# Patient Record
Sex: Female | Born: 1940 | Race: White | Hispanic: No | Marital: Married | Smoking: Never smoker
Health system: Southern US, Community
[De-identification: ages and names within clinical notes are randomized; demographics above are authoritative.]

## PROBLEM LIST (undated history)

## (undated) DIAGNOSIS — G8929 Other chronic pain: Secondary | ICD-10-CM

## (undated) DIAGNOSIS — K648 Other hemorrhoids: Secondary | ICD-10-CM

## (undated) DIAGNOSIS — J479 Bronchiectasis, uncomplicated: Secondary | ICD-10-CM

## (undated) DIAGNOSIS — J948 Other specified pleural conditions: Secondary | ICD-10-CM

## (undated) DIAGNOSIS — I10 Essential (primary) hypertension: Secondary | ICD-10-CM

## (undated) DIAGNOSIS — M79676 Pain in unspecified toe(s): Secondary | ICD-10-CM

## (undated) DIAGNOSIS — K5790 Diverticulosis of intestine, part unspecified, without perforation or abscess without bleeding: Secondary | ICD-10-CM

## (undated) DIAGNOSIS — C801 Malignant (primary) neoplasm, unspecified: Secondary | ICD-10-CM

## (undated) DIAGNOSIS — A439 Nocardiosis, unspecified: Principal | ICD-10-CM

## (undated) DIAGNOSIS — E871 Hypo-osmolality and hyponatremia: Secondary | ICD-10-CM

## (undated) DIAGNOSIS — M1990 Unspecified osteoarthritis, unspecified site: Secondary | ICD-10-CM

## (undated) DIAGNOSIS — T41205A Adverse effect of unspecified general anesthetics, initial encounter: Secondary | ICD-10-CM

## (undated) DIAGNOSIS — K581 Irritable bowel syndrome with constipation: Secondary | ICD-10-CM

## (undated) DIAGNOSIS — A310 Pulmonary mycobacterial infection: Secondary | ICD-10-CM

## (undated) DIAGNOSIS — T797XXA Traumatic subcutaneous emphysema, initial encounter: Secondary | ICD-10-CM

## (undated) DIAGNOSIS — T8859XA Other complications of anesthesia, initial encounter: Secondary | ICD-10-CM

## (undated) DIAGNOSIS — F419 Anxiety disorder, unspecified: Secondary | ICD-10-CM

## (undated) DIAGNOSIS — I514 Myocarditis, unspecified: Secondary | ICD-10-CM

## (undated) DIAGNOSIS — H469 Unspecified optic neuritis: Secondary | ICD-10-CM

## (undated) DIAGNOSIS — T50905A Adverse effect of unspecified drugs, medicaments and biological substances, initial encounter: Secondary | ICD-10-CM

## (undated) DIAGNOSIS — I639 Cerebral infarction, unspecified: Secondary | ICD-10-CM

## (undated) DIAGNOSIS — T4145XA Adverse effect of unspecified anesthetic, initial encounter: Secondary | ICD-10-CM

## (undated) DIAGNOSIS — R1310 Dysphagia, unspecified: Secondary | ICD-10-CM

## (undated) DIAGNOSIS — H5712 Ocular pain, left eye: Principal | ICD-10-CM

## (undated) DIAGNOSIS — M549 Dorsalgia, unspecified: Secondary | ICD-10-CM

## (undated) HISTORY — DX: Other chronic pain: G89.29

## (undated) HISTORY — DX: Unspecified optic neuritis: H46.9

## (undated) HISTORY — PX: VAGINAL HYSTERECTOMY: SUR661

## (undated) HISTORY — DX: Myocarditis, unspecified: I51.4

## (undated) HISTORY — DX: Pain in unspecified toe(s): M79.676

## (undated) HISTORY — DX: Other hemorrhoids: K64.8

## (undated) HISTORY — DX: Bronchiectasis, uncomplicated: J47.9

## (undated) HISTORY — DX: Dorsalgia, unspecified: M54.9

## (undated) HISTORY — DX: Anxiety disorder, unspecified: F41.9

## (undated) HISTORY — PX: BREAST BIOPSY: SHX20

## (undated) HISTORY — DX: Irritable bowel syndrome with constipation: K58.1

## (undated) HISTORY — DX: Hypo-osmolality and hyponatremia: E87.1

## (undated) HISTORY — PX: LAMINECTOMY: SHX219

## (undated) HISTORY — DX: Ocular pain, left eye: H57.12

## (undated) HISTORY — DX: Nocardiosis, unspecified: A43.9

## (undated) HISTORY — PX: CHOLECYSTECTOMY: SHX55

## (undated) HISTORY — DX: Adverse effect of unspecified drugs, medicaments and biological substances, initial encounter: T50.905A

## (undated) HISTORY — DX: Pulmonary mycobacterial infection: A31.0

## (undated) HISTORY — PX: CATARACT EXTRACTION W/ INTRAOCULAR LENS  IMPLANT, BILATERAL: SHX1307

## (undated) HISTORY — DX: Diverticulosis of intestine, part unspecified, without perforation or abscess without bleeding: K57.90

## (undated) HISTORY — DX: Dysphagia, unspecified: R13.10

## (undated) HISTORY — DX: Other specified pleural conditions: J94.8

## (undated) HISTORY — DX: Traumatic subcutaneous emphysema, initial encounter: T79.7XXA

---

## 2008-01-29 LAB — HM COLONOSCOPY

## 2010-10-09 ENCOUNTER — Other Ambulatory Visit: Payer: Self-pay | Admitting: Neurosurgery

## 2010-10-09 DIAGNOSIS — M545 Low back pain: Secondary | ICD-10-CM

## 2010-10-14 ENCOUNTER — Ambulatory Visit
Admission: RE | Admit: 2010-10-14 | Discharge: 2010-10-14 | Disposition: A | Discharge status: Home / Self Care | Payer: Medicare Other | Source: Ambulatory Visit | Attending: Neurosurgery | Admitting: Neurosurgery

## 2010-10-14 DIAGNOSIS — M545 Low back pain, unspecified: Secondary | ICD-10-CM

## 2010-11-20 ENCOUNTER — Other Ambulatory Visit: Payer: Self-pay | Admitting: Internal Medicine

## 2010-11-20 DIAGNOSIS — R922 Inconclusive mammogram: Secondary | ICD-10-CM

## 2010-12-05 ENCOUNTER — Other Ambulatory Visit: Payer: Self-pay | Admitting: Internal Medicine

## 2010-12-05 ENCOUNTER — Ambulatory Visit
Admission: RE | Admit: 2010-12-05 | Discharge: 2010-12-05 | Disposition: A | Discharge status: Home / Self Care | Payer: Medicare Other | Source: Ambulatory Visit | Attending: Internal Medicine | Admitting: Internal Medicine

## 2010-12-05 DIAGNOSIS — R922 Inconclusive mammogram: Secondary | ICD-10-CM

## 2011-02-15 DIAGNOSIS — H52209 Unspecified astigmatism, unspecified eye: Secondary | ICD-10-CM | POA: Diagnosis not present

## 2011-02-15 DIAGNOSIS — H259 Unspecified age-related cataract: Secondary | ICD-10-CM | POA: Diagnosis not present

## 2011-02-26 DIAGNOSIS — R05 Cough: Secondary | ICD-10-CM | POA: Diagnosis not present

## 2011-03-07 ENCOUNTER — Ambulatory Visit
Admission: RE | Admit: 2011-03-07 | Discharge: 2011-03-07 | Disposition: A | Discharge status: Home / Self Care | Payer: Medicare Other | Source: Ambulatory Visit | Attending: Internal Medicine | Admitting: Internal Medicine

## 2011-03-07 ENCOUNTER — Other Ambulatory Visit: Payer: Self-pay | Admitting: Internal Medicine

## 2011-03-07 DIAGNOSIS — R918 Other nonspecific abnormal finding of lung field: Secondary | ICD-10-CM

## 2011-03-07 DIAGNOSIS — J3489 Other specified disorders of nose and nasal sinuses: Secondary | ICD-10-CM | POA: Diagnosis not present

## 2011-03-07 DIAGNOSIS — J449 Chronic obstructive pulmonary disease, unspecified: Secondary | ICD-10-CM | POA: Diagnosis not present

## 2011-03-07 DIAGNOSIS — R05 Cough: Secondary | ICD-10-CM | POA: Diagnosis not present

## 2011-03-22 DIAGNOSIS — J323 Chronic sphenoidal sinusitis: Secondary | ICD-10-CM | POA: Diagnosis not present

## 2011-03-22 DIAGNOSIS — J342 Deviated nasal septum: Secondary | ICD-10-CM | POA: Diagnosis not present

## 2011-03-22 DIAGNOSIS — H9110 Presbycusis, unspecified ear: Secondary | ICD-10-CM | POA: Diagnosis not present

## 2011-03-22 DIAGNOSIS — R05 Cough: Secondary | ICD-10-CM | POA: Diagnosis not present

## 2011-03-22 DIAGNOSIS — J310 Chronic rhinitis: Secondary | ICD-10-CM | POA: Diagnosis not present

## 2011-04-12 ENCOUNTER — Telehealth: Payer: Self-pay | Admitting: Internal Medicine

## 2011-04-16 NOTE — Telephone Encounter (Signed)
Made in error

## 2011-04-18 ENCOUNTER — Ambulatory Visit (INDEPENDENT_AMBULATORY_CARE_PROVIDER_SITE_OTHER): Payer: Medicare Other | Admitting: Internal Medicine

## 2011-04-18 ENCOUNTER — Encounter: Payer: Self-pay | Admitting: Internal Medicine

## 2011-04-18 DIAGNOSIS — J479 Bronchiectasis, uncomplicated: Secondary | ICD-10-CM | POA: Diagnosis not present

## 2011-04-18 DIAGNOSIS — R05 Cough: Secondary | ICD-10-CM

## 2011-04-18 NOTE — Progress Notes (Signed)
04/18/11- 71 yo F never smoker referred by Dr Jacky Kindle because of cough. Persistent cough since January. In late February she was treated with 7 days of Levaquin after chest x-ray showed some density. Cough is better in warm moist air, worse when out in pollen or when active and better at night. Cough improved with Levaquin but persists. Notices postnasal drip. Saw ENT/Dr.Wolicki who didn't think there was enough of sinus problem to be of concern. Cough is dry. She denies enlarged glands, fever, sweat or weight loss. Denies history of lung or nasal condition. She has been told that she has some wheeze in the right lung, going back years. She insists she has always been healthy and athletic, never aware of shortness of breath or breathing problems. Denies GERD. Remote history of walking pneumonia. As a small child mother had tuberculosis. No PPD in many years but not remembered  as positive. Chest x-ray 02/26/2011-right-sided infiltrate recommend followup. Chest x-ray 03/07/2011-COPD, prominent linear markings in the right middle lobe may be due to atelectasis or scarring but pneumonia can't be excluded.  Prior to Admission medications   Medication Sig Start Date End Date Taking? Authorizing Provider  calcium carbonate (OS-CAL) 1250 MG chewable tablet Chew 1 tablet by mouth daily.   Yes Historical Provider, MD  Multiple Vitamin (MULTIVITAMIN) tablet Take 1 tablet by mouth daily.   Yes Historical Provider, MD   Past Medical History  Diagnosis Date  . Osteoporosis   . ALLERGIC RHINITIS   . Chronic back pain    Family History  Problem Relation Age of Onset  . Breast cancer Mother   . Stroke Father   . Pulmonary embolism Brother    History   Social History  . Marital Status: Married    Spouse Name: N/A    Number of Children: N/A  . Years of Education: N/A   Occupational History  . retired    Social History Main Topics  . Smoking status: Never Smoker   . Smokeless tobacco: Not on file    . Alcohol Use: 0.6 oz/week    1 Glasses of wine per week     at dinner  . Drug Use: No  . Sexually Active: Not on file   Other Topics Concern  . Not on file   Social History Narrative  . No narrative on file   ROS-see HPI Constitutional:   No-   weight loss, night sweats, fevers, chills, fatigue, lassitude. HEENT:   No-  headaches, difficulty swallowing, tooth/dental problems, sore throat,       No-  sneezing, itching, ear ache, nasal congestion, post nasal drip,  CV:  No-   chest pain, orthopnea, PND, swelling in lower extremities, anasarca, dizziness, palpitations Resp: No-   shortness of breath with exertion or at rest.              No-   productive cough,  + non-productive cough,  No- coughing up of blood.              No-   change in color of mucus.  + wheezing.   Skin: No-   rash or lesions. GI:  No-   heartburn, indigestion, abdominal pain, nausea, vomiting,  GU:  MS:  No-   joint pain or swelling.   Neuro-     nothing unusual Psych:  No- change in mood or affect. No depression or anxiety.  No memory loss.  OBJ- Physical Exam General- Alert, Oriented, Affect-appropriate, Distress- none acute, slender, talkative  Skin- rash-none, lesions- none, excoriation- none Lymphadenopathy- none Head- atraumatic            Eyes- Gross vision intact, PERRLA, conjunctivae and secretions clear            Ears- Hearing, canals-normal            Nose- Clear, no-Septal dev, mucus, polyps, erosion, perforation             Throat- Mallampati II , mucosa clear , drainage- none, tonsils- atrophic Neck- flexible , trachea midline, no stridor , thyroid nl, carotid no bruit Chest - symmetrical excursion , unlabored           Heart/CV- RRR , no murmur , no gallop  , no rub, nl s1 s2                           - JVD- none , edema- none, stasis changes- none, varices- none           Lung- wet rhonchi right posterior base, cough- none , dullness-none, rub- none           Chest wall-  Abd-  tender-no, distended-no, bowel sounds-present, HSM- no Br/ Gen/ Rectal- Not done, not indicated Extrem- cyanosis- none, clubbing, none, atrophy- none, strength- nl Neuro- grossly intact to observation

## 2011-04-18 NOTE — Patient Instructions (Signed)
Order- CT chest non contrast,  Dx bronchiectasis  Order- PFT    Dx bronchiectasis  Place PPD   Suggest you try otc antihistamine Claritin/ loratadine for a few day to see if it has any effect on the cough or sense of postnasal drip

## 2011-04-19 ENCOUNTER — Ambulatory Visit (INDEPENDENT_AMBULATORY_CARE_PROVIDER_SITE_OTHER)
Admission: RE | Admit: 2011-04-19 | Discharge: 2011-04-19 | Disposition: A | Discharge status: Home / Self Care | Payer: Medicare Other | Source: Ambulatory Visit | Attending: Internal Medicine | Admitting: Internal Medicine

## 2011-04-19 DIAGNOSIS — J479 Bronchiectasis, uncomplicated: Secondary | ICD-10-CM

## 2011-04-19 DIAGNOSIS — J984 Other disorders of lung: Secondary | ICD-10-CM | POA: Diagnosis not present

## 2011-04-19 DIAGNOSIS — R05 Cough: Secondary | ICD-10-CM | POA: Diagnosis not present

## 2011-04-20 LAB — TB SKIN TEST: TB Skin Test: NEGATIVE mm

## 2011-04-20 NOTE — Progress Notes (Signed)
Quick Note:  Pt aware of results. ______ 

## 2011-04-22 ENCOUNTER — Encounter: Payer: Self-pay | Admitting: Internal Medicine

## 2011-04-22 DIAGNOSIS — R05 Cough: Secondary | ICD-10-CM | POA: Insufficient documentation

## 2011-04-22 NOTE — Assessment & Plan Note (Signed)
Cough with lateralizing findings in posterior right lower lobe which by her description, or chronic. This suggests chronic bronchiectasis. Unclear how much may actually be an active pneumonia process, suggestive of MAIC. She denies reflux but may be incorrect.  Plan- CT chest with no contrast because she has history of contrast sensitivity. It may only be topical iodine but she is sensitive to. Schedule PFT. Place PPD skin test.

## 2011-04-30 DIAGNOSIS — C44711 Basal cell carcinoma of skin of unspecified lower limb, including hip: Secondary | ICD-10-CM | POA: Diagnosis not present

## 2011-04-30 DIAGNOSIS — D485 Neoplasm of uncertain behavior of skin: Secondary | ICD-10-CM | POA: Diagnosis not present

## 2011-05-01 DIAGNOSIS — H25019 Cortical age-related cataract, unspecified eye: Secondary | ICD-10-CM | POA: Diagnosis not present

## 2011-05-01 DIAGNOSIS — H25049 Posterior subcapsular polar age-related cataract, unspecified eye: Secondary | ICD-10-CM | POA: Diagnosis not present

## 2011-05-01 DIAGNOSIS — H5000 Unspecified esotropia: Secondary | ICD-10-CM | POA: Diagnosis not present

## 2011-05-01 DIAGNOSIS — H52209 Unspecified astigmatism, unspecified eye: Secondary | ICD-10-CM | POA: Diagnosis not present

## 2011-05-01 DIAGNOSIS — H2510 Age-related nuclear cataract, unspecified eye: Secondary | ICD-10-CM | POA: Diagnosis not present

## 2011-05-01 DIAGNOSIS — H269 Unspecified cataract: Secondary | ICD-10-CM | POA: Diagnosis not present

## 2011-05-01 DIAGNOSIS — H53009 Unspecified amblyopia, unspecified eye: Secondary | ICD-10-CM | POA: Diagnosis not present

## 2011-05-07 DIAGNOSIS — J323 Chronic sphenoidal sinusitis: Secondary | ICD-10-CM | POA: Diagnosis not present

## 2011-05-07 DIAGNOSIS — J310 Chronic rhinitis: Secondary | ICD-10-CM | POA: Diagnosis not present

## 2011-05-10 ENCOUNTER — Ambulatory Visit (INDEPENDENT_AMBULATORY_CARE_PROVIDER_SITE_OTHER): Payer: Medicare Other | Admitting: Internal Medicine

## 2011-05-10 DIAGNOSIS — J479 Bronchiectasis, uncomplicated: Secondary | ICD-10-CM | POA: Diagnosis not present

## 2011-05-10 LAB — PULMONARY FUNCTION TEST

## 2011-05-10 NOTE — Progress Notes (Signed)
PFT done today. 

## 2011-05-11 ENCOUNTER — Ambulatory Visit (INDEPENDENT_AMBULATORY_CARE_PROVIDER_SITE_OTHER): Payer: Medicare Other | Admitting: Internal Medicine

## 2011-05-11 ENCOUNTER — Encounter: Payer: Self-pay | Admitting: Internal Medicine

## 2011-05-11 DIAGNOSIS — J479 Bronchiectasis, uncomplicated: Secondary | ICD-10-CM | POA: Diagnosis not present

## 2011-05-11 NOTE — Patient Instructions (Signed)
You have a kind of airway scarring called " bronchiectasis" and it might be related to an infection called Mycobacterium avium. We have not proven that yet.  I suggest you try to finish the antibiotics that Dr Lazarus Salines gave you.  After that, we will see how you are doing.

## 2011-05-11 NOTE — Progress Notes (Signed)
04/18/11- 71 yo F never smoker referred by Dr Jacky Kindle because of cough. Persistent cough since January. In late February she was treated with 7 days of Levaquin after chest x-ray showed some density. Cough is better in warm moist air, worse when out in pollen or when active and better at night. Cough improved with Levaquin but persists. Notices postnasal drip. Saw ENT/Dr.Wolicki who didn't think there was enough of sinus problem to be of concern. Cough is dry. She denies enlarged glands, fever, sweat or weight loss. Denies history of lung or nasal condition. She has been told that she has some wheeze in the right lung, going back years. She insists she has always been healthy and athletic, never aware of shortness of breath or breathing problems. Denies GERD. Remote history of walking pneumonia. As a small child mother had tuberculosis. No PPD in many years but not remembered  as positive. Chest x-ray 02/26/2011-right-sided infiltrate recommend followup. Chest x-ray 03/07/2011-COPD, prominent linear markings in the right middle lobe may be due to atelectasis or scarring but pneumonia can't be excluded.  05/11/11-  70 yoF never smoker followed for cough     PCO Dr Jacky Kindle      Husband here Denies seasonal rhinitis complaints. Still some dry cough with a sense there is mucus in her throat. Dr Lazarus Salines ENT gave Z-Pak then Cleocin now for 30 days. She denies [place]ness [place] chest 04/18/11- reviewed w/ them-- IMPRESSION:  Diffuse peribronchovascular nodularity, right greater than left,  with areas of bronchiectasis and mild cavitation. 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 Follow-up of the  dominant cavitary lesion in the right lower lobe is recommended in  4-6 weeks to ensure stability, as a developing abscess cannot be  excluded. Similarly, although felt to be less likely, malignancy  cannot be definitively excluded and continued consideration on    follow-up exams is warranted.  Original Report Authenticated By: Reyes Ivan, M.D.  PFT 05/10/11- mild obstructive airways disease, small airways, insignificant response to bronchodilator. air trapping, hyperinflation, normal diffusion FEV1 1.81/111%, FEV1/FVC 0.71, FEF 25-75% 1.19/59%. TLC 134%, RV 174%, DLCO 115%.  ROS-see HPI Constitutional:   No-   weight loss, night sweats, fevers, chills, fatigue, lassitude. HEENT:   No-  headaches, difficulty swallowing, tooth/dental problems, sore throat,       No-  sneezing, itching, ear ache, nasal congestion, post nasal drip,  CV:  No-   chest pain, orthopnea, PND, swelling in lower extremities, anasarca, dizziness, palpitations Resp: No-   shortness of breath with exertion or at rest.              No-   productive cough,  + non-productive cough,  No- coughing up of blood.              No-   change in color of mucus.  + wheezing.   Skin: No-   rash or lesions. GI:  No-   heartburn, indigestion, abdominal pain, nausea, vomiting,  GU:  MS:  No-   joint pain or swelling.   Neuro-     nothing unusual Psych:  No- change in mood or affect. No depression or anxiety.  No memory loss.  OBJ- Physical Exam General- Alert, Oriented, Affect-appropriate, Distress- none acute, slender, talkative Skin- rash-none, lesions- none, excoriation- none Lymphadenopathy- none Head- atraumatic            Eyes- Gross vision intact, PERRLA, conjunctivae and secretions clear  Ears- Hearing, canals-normal            Nose- Clear, no-Septal dev, mucus, polyps, erosion, perforation             Throat- Mallampati II , mucosa clear , drainage- none, tonsils- atrophic Neck- flexible , trachea midline, no stridor , thyroid nl, carotid no bruit Chest - symmetrical excursion , unlabored           Heart/CV- RRR , no murmur , no gallop  , no rub, nl s1 s2                           - JVD- none , edema- none, stasis changes- none, varices- none           Lung- wet  squeaks, cough- none , dullness-none, rub- none           Chest wall-  Abd-  Br/ Gen/ Rectal- Not done, not indicated Extrem- cyanosis- none, clubbing, none, atrophy- none, strength- nl Neuro- grossly intact to observation

## 2011-05-14 DIAGNOSIS — J479 Bronchiectasis, uncomplicated: Secondary | ICD-10-CM | POA: Insufficient documentation

## 2011-05-14 NOTE — Assessment & Plan Note (Signed)
We will need to follow with imaging. She denies any reflux symptoms to suggest aspiration. She has been on a Z-Pak and now long course of Cleocin from her ENT physician. We can see what impact this has on her chest.

## 2011-05-21 ENCOUNTER — Ambulatory Visit: Payer: Medicare Other | Admitting: Internal Medicine

## 2011-05-30 DIAGNOSIS — C44711 Basal cell carcinoma of skin of unspecified lower limb, including hip: Secondary | ICD-10-CM | POA: Diagnosis not present

## 2011-06-05 ENCOUNTER — Telehealth: Payer: Self-pay | Admitting: Internal Medicine

## 2011-06-05 DIAGNOSIS — J40 Bronchitis, not specified as acute or chronic: Secondary | ICD-10-CM

## 2011-06-05 DIAGNOSIS — R911 Solitary pulmonary nodule: Secondary | ICD-10-CM

## 2011-06-05 NOTE — Telephone Encounter (Signed)
Last seen by CDY 5.3.13.  Was given zpak and cleocin x30d by ENT  Called spoke with patient who feels like her cough has gradually gotten worse.  Finished the cleocin yesterday.  Still having non productive cough, though when she can produce mucus it is clear; "terrible coughing spells" and and irritation in her throat.  Stated CDY mentioned a culture at ov.  Dr Maple Hudson please advise, thanks.  Allergies  Allergen Reactions  . Aspirin   . Betadine (Povidone Iodine)   . Codeine   . Iodinated Diagnostic Agents   . Morphine And Related   . Penicillins   . Sulfa Antibiotics      *please call pt back on her cell at [phone].

## 2011-06-05 NOTE — Telephone Encounter (Signed)
Error.  Duplicate message.  Holly D Pryor °- °

## 2011-06-05 NOTE — Telephone Encounter (Signed)
Pt aware that orders are placed. States she does not want anything for a cough as she is intolerant to most drugs. Will come by tomorrow for CXR and sputum culture/AFB smear & culture.

## 2011-06-05 NOTE — Telephone Encounter (Signed)
Order- CXR dx lung nodule, bronchitis              Sputum for AFB smear and culture    Ok to offer cough med. Looks as if she is intolerant of most narcotics. Does know how she does with tramadol or with benzonatate- we could call in something tomorrow.

## 2011-06-06 ENCOUNTER — Ambulatory Visit (INDEPENDENT_AMBULATORY_CARE_PROVIDER_SITE_OTHER)
Admission: RE | Admit: 2011-06-06 | Discharge: 2011-06-06 | Disposition: A | Discharge status: Home / Self Care | Payer: Medicare Other | Source: Ambulatory Visit | Attending: Internal Medicine | Admitting: Internal Medicine

## 2011-06-06 DIAGNOSIS — R911 Solitary pulmonary nodule: Secondary | ICD-10-CM | POA: Diagnosis not present

## 2011-06-06 DIAGNOSIS — J40 Bronchitis, not specified as acute or chronic: Secondary | ICD-10-CM

## 2011-06-06 DIAGNOSIS — J984 Other disorders of lung: Secondary | ICD-10-CM | POA: Diagnosis not present

## 2011-06-06 DIAGNOSIS — R05 Cough: Secondary | ICD-10-CM | POA: Diagnosis not present

## 2011-06-07 ENCOUNTER — Other Ambulatory Visit: Payer: Medicare Other

## 2011-06-07 ENCOUNTER — Other Ambulatory Visit: Payer: Self-pay | Admitting: Internal Medicine

## 2011-06-07 DIAGNOSIS — J40 Bronchitis, not specified as acute or chronic: Secondary | ICD-10-CM

## 2011-06-07 DIAGNOSIS — R911 Solitary pulmonary nodule: Secondary | ICD-10-CM

## 2011-06-08 ENCOUNTER — Telehealth: Payer: Self-pay | Admitting: Internal Medicine

## 2011-06-08 NOTE — Telephone Encounter (Signed)
Received phone call from Chip Boer in the [employer] calling with critical lab result.  Call transferred to my line.  Spoke with Gladstone Lighter w/ [facility] Loh who reported patient's sputum culture is AFB postive 2+.  Report to be faxed to the triage line.  CDY is aware.  Will forward phone note and await report.

## 2011-06-10 LAB — RESPIRATORY CULTURE OR RESPIRATORY AND SPUTUM CULTURE
Gram Stain: NONE SEEN
Gram Stain: NONE SEEN
Organism ID, Bacteria: NORMAL

## 2011-06-11 DIAGNOSIS — I10 Essential (primary) hypertension: Secondary | ICD-10-CM | POA: Diagnosis not present

## 2011-06-11 NOTE — Telephone Encounter (Signed)
Note re coumadin entered in error.

## 2011-06-12 NOTE — Progress Notes (Signed)
Quick Note:  Pt aware of results. ______ 

## 2011-06-13 ENCOUNTER — Telehealth: Payer: Self-pay | Admitting: Internal Medicine

## 2011-06-13 MED ORDER — CLARITHROMYCIN 500 MG PO TABS: ORAL_TABLET | ORAL | Status: DC

## 2011-06-13 MED ORDER — ETHAMBUTOL HCL 400 MG PO TABS: ORAL_TABLET | ORAL | Status: DC

## 2011-06-13 MED ORDER — RIFAMPIN 300 MG PO CAPS: ORAL_CAPSULE | ORAL | Status: DC

## 2011-06-13 NOTE — Telephone Encounter (Signed)
Mia Budge, MD 06/13/2011 12:35 PM Signed  Order:  1) Biaxin 500 mg # 24 2 tabs, after meal, three days per week- M,T,W Refill x 5  2) Ethambutol 400mg , #36 3 tabs, Three days per week- M,T,W Refill x 5  3) Rifadin 300 mg, # 24 2 tabs, Three days per week- M, T, F Refill x 5  Need ov with me in 1 month with blood to be drawn then for liver panel, and also CXR for dx chronic bronchitis  Pt is aware of results, Rx's sent and has ROV 07-02-11 with CY already scheduled. Rx's sent to [facility].

## 2011-06-13 NOTE — Telephone Encounter (Signed)
Returning call to the nurse [phone]

## 2011-06-13 NOTE — Telephone Encounter (Signed)
Order:  1) Biaxin 500 mg # 24      2 tabs, after meal, three days per week- M,T,W  Refill x 5  2) Ethambutol 400mg ,  #36    3 tabs,  Three days per week- M,T,W Refill x 5  3) Rifadin 300 mg, # 24      2 tabs, Three days per week- M, T, F  Refill x 5  Need ov with me in 1 month with blood to be drawn then for liver panel, and also CXR for dx chronic bronchitis

## 2011-06-13 NOTE — Telephone Encounter (Signed)
Addended by: Reynaldo Minium C on: 06/13/2011 02:14 PM   Modules accepted: Orders

## 2011-06-18 ENCOUNTER — Telehealth: Payer: Self-pay | Admitting: Internal Medicine

## 2011-06-18 NOTE — Telephone Encounter (Signed)
atc na wcb 

## 2011-06-19 NOTE — Telephone Encounter (Signed)
Called, spoke with pt.   Informed her of below per Dr. Maple Hudson. She verbalized understanding of this, and nothing further needed at this time.  She did state she has an eye dr appt tomorrow and is aware to take med bottles with her.

## 2011-06-19 NOTE — Telephone Encounter (Signed)
The yellow color is common.  Probiotic was the right suggestion- thanks. I would like her to see her eye doctor. Higher doses of ethambutol than this have sometimes been associated with changes in color vision. I don't expect that, but if there is any question at all, it is best she see her eye doctor, and take her medicine bottles with her so he can see what she is taking.

## 2011-06-19 NOTE — Telephone Encounter (Signed)
Spoke with pt. She states after taking first dose of ethambutol, biaxin, and rifampin, she has noticed that her urine is dark orange in color and has also had some diarrhea. I advised may help if she takes probiotic or can eat yogurt daily to help with GI s/e. She states that after reading the s/e she understands that the change in urine color is a s/e. She is concerned of possible blindness that she read about from taking meds b/c she already has poor vision. I advised that in prescribing these meds, MD has determined that the benefits of med outweigh the risks. Pt verbalized understanding. Will forward to CDY for additional recs. Note that the pt has f/u with you in 07/02/11. Please advise, thanks Allergies  Allergen Reactions  . Aspirin   . Betadine (Povidone Iodine)   . Codeine   . Iodinated Diagnostic Agents   . Morphine And Related   . Penicillins   . Sulfa Antibiotics

## 2011-06-25 ENCOUNTER — Telehealth: Payer: Self-pay | Admitting: Internal Medicine

## 2011-06-25 NOTE — Telephone Encounter (Signed)
Spoke with pt and notified of recs per CDY She verbalized understanding and states nothing further needed 

## 2011-06-25 NOTE — Telephone Encounter (Signed)
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 I am not concerned if she sees a little blood occasionally. If it continues over several weeks, or gets worse, then please let me know.

## 2011-06-25 NOTE — Telephone Encounter (Signed)
Called spoke with patient who reported some blood-tinged mucus x1 yesterday morning (6.17.13).  The mucus was yellowish-white with BRB streaking it.  Pt stated that the amount of mucus was slight but has increased since.  Is tolerating the abx well with only some occasional nausea, but not debilitating.  Will forward to CDY for recs.    Dr Maple Hudson please advise, thanks.

## 2011-07-02 ENCOUNTER — Ambulatory Visit (INDEPENDENT_AMBULATORY_CARE_PROVIDER_SITE_OTHER): Payer: Medicare Other | Admitting: Internal Medicine

## 2011-07-02 ENCOUNTER — Encounter: Payer: Self-pay | Admitting: Internal Medicine

## 2011-07-02 ENCOUNTER — Other Ambulatory Visit (INDEPENDENT_AMBULATORY_CARE_PROVIDER_SITE_OTHER): Payer: Medicare Other

## 2011-07-02 VITALS — BP 160/98 | HR 77 | Ht 60.0 in | Wt 102.8 lb

## 2011-07-02 DIAGNOSIS — J479 Bronchiectasis, uncomplicated: Secondary | ICD-10-CM | POA: Diagnosis not present

## 2011-07-02 DIAGNOSIS — A319 Mycobacterial infection, unspecified: Secondary | ICD-10-CM | POA: Diagnosis not present

## 2011-07-02 LAB — BASIC METABOLIC PANEL
CO2: 32 mEq/L (ref 19–32)
Chloride: 96 mEq/L (ref 96–112)
Creatinine, Ser: 0.6 mg/dL (ref 0.4–1.2)
Potassium: 4.4 mEq/L (ref 3.5–5.1)

## 2011-07-02 LAB — HEPATIC FUNCTION PANEL
ALT: 25 U/L (ref 0–35)
AST: 33 U/L (ref 0–37)
Alkaline Phosphatase: 58 U/L (ref 39–117)
Bilirubin, Direct: 0.1 mg/dL (ref 0.0–0.3)
Total Protein: 7.4 g/dL (ref 6.0–8.3)

## 2011-07-02 MED ORDER — RIFAMPIN 300 MG PO CAPS: ORAL_CAPSULE | ORAL | Status: DC

## 2011-07-02 MED ORDER — ETHAMBUTOL HCL 400 MG PO TABS: ORAL_TABLET | ORAL | Status: DC

## 2011-07-02 MED ORDER — CLARITHROMYCIN 500 MG PO TABS: ORAL_TABLET | ORAL | Status: DC

## 2011-07-02 NOTE — Progress Notes (Signed)
04/18/11- 71 yo F never smoker referred by Dr Jacky Kindle because of cough. Persistent cough since January. In late February she was treated with 7 days of Levaquin after chest x-ray showed some density. Cough is better in warm moist air, worse when out in pollen or when active and better at night. Cough improved with Levaquin but persists. Notices postnasal drip. Saw ENT/Dr.Wolicki who didn't think there was enough of sinus problem to be of concern. Cough is dry. She denies enlarged glands, fever, sweat or weight loss. Denies history of lung or nasal condition. She has been told that she has some wheeze in the right lung, going back years. She insists she has always been healthy and athletic, never aware of shortness of breath or breathing problems. Denies GERD. Remote history of walking pneumonia. As a small child mother had tuberculosis. No PPD in many years but not remembered  as positive. Chest x-ray 02/26/2011-right-sided infiltrate recommend followup. Chest x-ray 03/07/2011-COPD, prominent linear markings in the right middle lobe may be due to atelectasis or scarring but pneumonia can't be excluded.  05/11/11-  71 yoF never smoker followed for cough     PCP Dr Jacky Kindle      Husband here Denies seasonal rhinitis complaints. Still some dry cough with a sense there is mucus in her throat. Dr Lazarus Salines ENT gave Z-Pak then Cleocin now for 30 days. She denies [place]ness [place] chest 04/18/11- reviewed w/ them-- IMPRESSION:  Diffuse peribronchovascular nodularity, right greater than left,  with areas of bronchiectasis and mild cavitation. 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 Follow-up of the  dominant cavitary lesion in the right lower lobe is recommended in  4-6 weeks to ensure stability, as a developing abscess cannot be  excluded. Similarly, although felt to be less likely, malignancy  cannot be definitively excluded and continued consideration on    follow-up exams is warranted.  Original Report Authenticated By: Reyes Ivan, M.D.  PFT 05/10/11- mild obstructive airways disease, small airways, insignificant response to bronchodilator. air trapping, hyperinflation, normal diffusion FEV1 1.81/111%, FEV1/FVC 0.71, FEF 25-75% 1.19/59%. TLC 134%, RV 174%, DLCO 115%.  07/02/11- 71 yoF never smoker followed for cough, +MAIC/ triple therapy    PCP Dr Jacky Kindle      Husband here Slow 1 little streak of blood in mucus as a single event. Mostly thin clear mucus now. Denies fever, night sweats, nodes. Of her 3 drugs, Biaxin bothers her the most and she thinks it is affecting her hearing. She wears hearing aids already, fitted by the audiologist at [facility] Dr. Dagoberto Ligas has already checked her eyes on ethambutol. Sputum Cx - 06/07/11- + M.avium CXR 06/12/11- reviewed IMPRESSION:  Stable radiographic appearance of the chest since 03/07/2011.  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 Continued follow-up of the right lower lobe cavitary  lesion is recommended.  Original Report Authenticated By: Harley Hallmark, M.D.   ROS-see HPI Constitutional:   No-   weight loss, night sweats, fevers, chills, fatigue, lassitude. HEENT:   No-  headaches, difficulty swallowing, tooth/dental problems, sore throat,       No-  sneezing, itching, ear ache, nasal congestion, post nasal drip,  CV:  No-   chest pain, orthopnea, PND, swelling in lower extremities, anasarca, dizziness, palpitations Resp: No-   shortness of breath with exertion or at rest.              +  productive cough,  +  non-productive cough,  No- coughing up of blood.              No-   change in color of mucus.  + wheezing.   Skin: No-   rash or lesions. GI:  No-   heartburn, indigestion, abdominal pain, nausea, vomiting,  GU:  MS:  No-   joint pain or swelling.   Neuro-     nothing unusual Psych:  No- change in mood or affect. No depression or anxiety.  No  memory loss.  OBJ- Physical Exam BP 160/98  Pulse 77  Ht 5' (1.524 m)  Wt 102 lb 12.8 oz (46.63 kg)  BMI 20.08 kg/m2  SpO2 99%  General- Alert, Oriented, Affect-appropriate, Distress- none acute, slender, talkative Skin- rash-none, lesions- none, excoriation- none Lymphadenopathy- none Head- atraumatic            Eyes- Gross vision intact, PERRLA, conjunctivae and secretions clear            Ears- Hearing aides            Nose- Clear, no-Septal dev, mucus, polyps, erosion, perforation             Throat- Mallampati II , mucosa clear , drainage- none, tonsils- atrophic Neck- flexible , trachea midline, no stridor , thyroid nl, carotid no bruit Chest - symmetrical excursion , unlabored           Heart/CV- RRR , no murmur , no gallop  , no rub, nl s1 s2                           - JVD- none , edema- none, stasis changes- none, varices- none           Lung- wet squeaks, cough- none , dullness-none, rub- none           Chest wall-  Abd-  Br/ Gen/ Rectal- Not done, not indicated Extrem- cyanosis- none, clubbing, none, atrophy- none, strength- nl Neuro- grossly intact to observation

## 2011-07-02 NOTE — Patient Instructions (Addendum)
Reduce your Biaxin/ clarithromycin to 500 mg, three days per week  Order- BMET and Liver panel      Dx bronchiectasis              CXR to be done at next visit-    Dx bronchiectais

## 2011-07-05 ENCOUNTER — Telehealth: Payer: Self-pay | Admitting: Internal Medicine

## 2011-07-05 NOTE — Telephone Encounter (Signed)
Contacted and notified patient of lab results. Patient aware and nothing else needed.

## 2011-07-07 DIAGNOSIS — A319 Mycobacterial infection, unspecified: Secondary | ICD-10-CM | POA: Insufficient documentation

## 2011-07-07 NOTE — Assessment & Plan Note (Signed)
She is concerned Biaxin may be affecting her hearing. We will reduce Biaxin from 1000 mg 3 days per week to 500 mg 3 days per week. She is to see the audiologist at her ENT office. Prescriptions are written for 90 day supply.

## 2011-07-18 DIAGNOSIS — L821 Other seborrheic keratosis: Secondary | ICD-10-CM | POA: Diagnosis not present

## 2011-07-18 DIAGNOSIS — Z85828 Personal history of other malignant neoplasm of skin: Secondary | ICD-10-CM | POA: Diagnosis not present

## 2011-07-23 LAB — AFB CULTURE WITH SMEAR (NOT AT ARMC)

## 2011-08-08 ENCOUNTER — Telehealth: Payer: Self-pay | Admitting: Internal Medicine

## 2011-08-08 NOTE — Progress Notes (Signed)
Quick Note:  I spoke with patient about results and she verbalized understanding and had no questions ______ 

## 2011-08-08 NOTE — Progress Notes (Signed)
Quick Note:  LMTCB ______ 

## 2011-08-08 NOTE — Telephone Encounter (Signed)
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 We will discuss management at ov  I spoke with patient about results and she verbalized understanding and had no questions

## 2011-08-31 ENCOUNTER — Encounter: Payer: Self-pay | Admitting: Internal Medicine

## 2011-08-31 ENCOUNTER — Ambulatory Visit (INDEPENDENT_AMBULATORY_CARE_PROVIDER_SITE_OTHER)
Admission: RE | Admit: 2011-08-31 | Discharge: 2011-08-31 | Disposition: A | Discharge status: Home / Self Care | Payer: Medicare Other | Source: Ambulatory Visit | Attending: Internal Medicine | Admitting: Internal Medicine

## 2011-08-31 ENCOUNTER — Ambulatory Visit (INDEPENDENT_AMBULATORY_CARE_PROVIDER_SITE_OTHER): Payer: Medicare Other | Admitting: Internal Medicine

## 2011-08-31 VITALS — BP 140/90 | HR 70 | Ht 60.0 in | Wt 97.6 lb

## 2011-08-31 DIAGNOSIS — A319 Mycobacterial infection, unspecified: Secondary | ICD-10-CM | POA: Diagnosis not present

## 2011-08-31 DIAGNOSIS — A310 Pulmonary mycobacterial infection: Secondary | ICD-10-CM

## 2011-08-31 DIAGNOSIS — J479 Bronchiectasis, uncomplicated: Secondary | ICD-10-CM

## 2011-08-31 DIAGNOSIS — A318 Other mycobacterial infections: Secondary | ICD-10-CM

## 2011-08-31 NOTE — Patient Instructions (Addendum)
Continue present meds    Order- CXR      At next visit               dx atypical AFB infection, bronchiectasis             Lab- BMET and Liver enzyme panel   At next visit - ok to get drawn at lab of choice

## 2011-08-31 NOTE — Progress Notes (Signed)
04/18/11- 71 yo F never smoker referred by Dr Jacky Kindle because of cough. Persistent cough since January. In late February she was treated with 7 days of Levaquin after chest x-ray showed some density. Cough is better in warm moist air, worse when out in pollen or when active and better at night. Cough improved with Levaquin but persists. Notices postnasal drip. Saw ENT/Dr.Wolicki who didn't think there was enough of sinus problem to be of concern. Cough is dry. She denies enlarged glands, fever, sweat or weight loss. Denies history of lung or nasal condition. She has been told that she has some wheeze in the right lung, going back years. She insists she has always been healthy and athletic, never aware of shortness of breath or breathing problems. Denies GERD. Remote history of walking pneumonia. As a small child mother had tuberculosis. No PPD in many years but not remembered  as positive. Chest x-ray 02/26/2011-right-sided infiltrate recommend followup. Chest x-ray 03/07/2011-COPD, prominent linear markings in the right middle lobe may be due to atelectasis or scarring but pneumonia can't be excluded.  05/11/11-  72 yoF never smoker followed for cough     PCP Dr Jacky Kindle      Husband here Denies seasonal rhinitis complaints. Still some dry cough with a sense there is mucus in her throat. Dr Lazarus Salines ENT gave Z-Pak then Cleocin now for 30 days. She [place] chest 04/18/11- reviewed w/ them-- IMPRESSION:  Diffuse peribronchovascular nodularity, right greater than left,  with areas of bronchiectasis and mild cavitation. 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 Follow-up of the  dominant cavitary lesion in the right lower lobe is recommended in  4-6 weeks to ensure stability, as a developing abscess cannot be  excluded. Similarly, although felt to be less likely, malignancy  cannot be definitively excluded and continued consideration on    follow-up exams is warranted.  Original Report Authenticated By: Reyes Ivan, M.D.  PFT 05/10/11- mild obstructive airways disease, small airways, insignificant response to bronchodilator. air trapping, hyperinflation, normal diffusion FEV1 1.81/111%, FEV1/FVC 0.71, FEF 25-75% 1.19/59%. TLC 134%, RV 174%, DLCO 115%.  07/02/11- 70 yoF never smoker followed for cough, +MAIC/ triple therapy    PCP Dr Jacky Kindle      Husband here Slow 1 little streak of blood in mucus as a single event. Mostly thin clear mucus now. Denies fever, night sweats, nodes. Of her 3 drugs, Biaxin bothers her the most and she thinks it is affecting her hearing. She wears hearing aids already, fitted by the audiologist at [facility] Dr. Dagoberto Ligas has already checked her eyes on ethambutol. Sputum Cx - 06/07/11- + M.avium CXR 06/12/11- reviewed IMPRESSION:  Stable radiographic appearance of the chest since 03/07/2011.  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 Continued follow-up of the right lower lobe cavitary  lesion is recommended.  Original Report Authenticated By: Harley Hallmark, M.D.   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 Feeling well. Not coughing at all-cough was her original complaint. Tolerating medications well as we reduced Biaxin from 1500 mg 3 days per week. That was because of concern about her hearing.  She suspects now there was no connection. She denies fever, sweat, chills. Scant sputum is clear. CXR 08/31/11- reviewed with her IMPRESSION:  1. Persistent cavitary lesion in the right lower lobe. Favor  Mycobacterium avium intracellular. Recommend ongoing radiographic  follow-up.  2. Similar appearance of interstitial thickening and right middle  lobe bronchiectasis.  Original Report Authenticated By: Consuello Bossier, M.D.   ROS-see HPI Constitutional:   No-   weight loss, night sweats, fevers, chills, fatigue, lassitude. HEENT:   No-  headaches, difficulty swallowing, tooth/dental problems, sore throat,       No-  sneezing, itching, ear ache, nasal congestion, post nasal drip,  CV:  No-   chest pain, orthopnea, PND, swelling in lower extremities, anasarca, dizziness, palpitations Resp: No-   shortness of breath with exertion or at rest.              + Little productive cough,  little non-productive cough,  No- coughing up of blood.              No-   change in color of mucus.  + wheezing.   Skin: No-   rash or lesions. GI:  No-   heartburn, indigestion, abdominal pain, nausea, vomiting,  GU:  MS:  No-   joint pain or swelling.   Neuro-     nothing unusual Psych:  No- change in mood or affect. No depression or anxiety.  No memory loss.  OBJ- Physical Exam BP 140/90  Pulse 70  Ht 5' (1.524 m)  Wt 97 lb 9.6 oz (44.271 kg)  BMI 19.06 kg/m2  SpO2 98%  General- Alert, Oriented, Affect-appropriate, Distress- none acute, slender, talkative Skin- rash-none, lesions- none, excoriation- none Lymphadenopathy- none Head- atraumatic            Eyes- Gross vision intact, PERRLA, conjunctivae and secretions clear            Ears- Hearing aides            Nose- Clear, no-Septal dev, mucus, polyps, erosion, perforation             Throat- Mallampati II , mucosa clear , drainage- none, tonsils- atrophic Neck- flexible , trachea midline, no stridor , thyroid nl, carotid no bruit Chest -  symmetrical excursion , unlabored           Heart/CV- RRR , no murmur , no gallop  , no rub, nl s1 s2                           - JVD- none , edema- none, stasis changes- none, varices- none           Lung- +wet squeaks right base, cough- none , dullness-none, rub- none           Chest wall-  Abd-  Br/ Gen/ Rectal- Not done, not indicated Extrem- cyanosis- none, clubbing, none, atrophy- none, strength- nl Neuro- grossly intact to observation

## 2011-09-05 DIAGNOSIS — N63 Unspecified lump in unspecified breast: Secondary | ICD-10-CM | POA: Diagnosis not present

## 2011-09-07 DIAGNOSIS — N6019 Diffuse cystic mastopathy of unspecified breast: Secondary | ICD-10-CM | POA: Diagnosis not present

## 2011-09-07 DIAGNOSIS — N63 Unspecified lump in unspecified breast: Secondary | ICD-10-CM | POA: Diagnosis not present

## 2011-09-07 DIAGNOSIS — N6009 Solitary cyst of unspecified breast: Secondary | ICD-10-CM | POA: Diagnosis not present

## 2011-09-09 NOTE — Assessment & Plan Note (Addendum)
Tolerating medications well. We will not push the Biaxin dose, although this is the most effective medication. She is tolerating her meds well and is symptomatically improved.

## 2011-09-09 NOTE — Assessment & Plan Note (Signed)
Symptoms are improved. Anticipate slow closure of the right lower lobe cavity as she continues treatment.

## 2011-09-19 DIAGNOSIS — N6009 Solitary cyst of unspecified breast: Secondary | ICD-10-CM | POA: Diagnosis not present

## 2011-12-04 DIAGNOSIS — R58 Hemorrhage, not elsewhere classified: Secondary | ICD-10-CM | POA: Diagnosis not present

## 2011-12-04 DIAGNOSIS — I2510 Atherosclerotic heart disease of native coronary artery without angina pectoris: Secondary | ICD-10-CM | POA: Diagnosis not present

## 2011-12-04 DIAGNOSIS — I10 Essential (primary) hypertension: Secondary | ICD-10-CM | POA: Diagnosis not present

## 2011-12-04 DIAGNOSIS — R05 Cough: Secondary | ICD-10-CM | POA: Diagnosis not present

## 2011-12-04 DIAGNOSIS — R0609 Other forms of dyspnea: Secondary | ICD-10-CM | POA: Diagnosis not present

## 2011-12-04 DIAGNOSIS — R918 Other nonspecific abnormal finding of lung field: Secondary | ICD-10-CM | POA: Diagnosis not present

## 2011-12-04 DIAGNOSIS — R042 Hemoptysis: Secondary | ICD-10-CM | POA: Diagnosis not present

## 2011-12-04 DIAGNOSIS — J984 Other disorders of lung: Secondary | ICD-10-CM | POA: Diagnosis not present

## 2011-12-05 ENCOUNTER — Telehealth: Payer: Self-pay | Admitting: Internal Medicine

## 2011-12-05 DIAGNOSIS — J984 Other disorders of lung: Secondary | ICD-10-CM | POA: Diagnosis not present

## 2011-12-05 DIAGNOSIS — R05 Cough: Secondary | ICD-10-CM | POA: Diagnosis not present

## 2011-12-05 DIAGNOSIS — Z418 Encounter for other procedures for purposes other than remedying health state: Secondary | ICD-10-CM | POA: Diagnosis not present

## 2011-12-05 DIAGNOSIS — F17200 Nicotine dependence, unspecified, uncomplicated: Secondary | ICD-10-CM | POA: Diagnosis present

## 2011-12-05 DIAGNOSIS — R918 Other nonspecific abnormal finding of lung field: Secondary | ICD-10-CM | POA: Diagnosis not present

## 2011-12-05 DIAGNOSIS — J449 Chronic obstructive pulmonary disease, unspecified: Secondary | ICD-10-CM | POA: Diagnosis not present

## 2011-12-05 DIAGNOSIS — J209 Acute bronchitis, unspecified: Secondary | ICD-10-CM | POA: Diagnosis not present

## 2011-12-05 DIAGNOSIS — A318 Other mycobacterial infections: Secondary | ICD-10-CM | POA: Diagnosis present

## 2011-12-05 DIAGNOSIS — I10 Essential (primary) hypertension: Secondary | ICD-10-CM | POA: Diagnosis present

## 2011-12-05 DIAGNOSIS — R042 Hemoptysis: Secondary | ICD-10-CM | POA: Diagnosis not present

## 2011-12-05 DIAGNOSIS — J479 Bronchiectasis, uncomplicated: Secondary | ICD-10-CM | POA: Diagnosis present

## 2011-12-05 NOTE — Telephone Encounter (Signed)
Doctor ?sp Sohnberger - pulmonologist- Pt was visiting family in [place], developed hemoptysis. I compared verbal description of CT reports with him and sounds similar. They will put her on a general antibiotic, make sure she's stable, and have her bring copy of her CT disk from [facility] when she comes back to [place].

## 2011-12-10 ENCOUNTER — Telehealth: Payer: Self-pay | Admitting: Internal Medicine

## 2011-12-10 NOTE — Telephone Encounter (Signed)
Per CY-as long as she is not getting any worse then its okay to wait until 12-21-11 appt.

## 2011-12-10 NOTE — Telephone Encounter (Signed)
I spoke with pt and she stated her husband is going to bring over all her paperwork over to Dr. Maple Hudson today. Per pt they did not giver her ct SCAN ON cd . She stated we may need to call to get the CT put ion CD and sent over. PT had an appt on 12/21/11 with Dr. Maple Hudson, she is still coughing up dark blood. But less and less daily. She is wanting to kniow if she needs to be seen sooner. Please advise Dr. Maple Hudson thanks

## 2011-12-10 NOTE — Telephone Encounter (Signed)
Pt advised. Jennifer Castillo, CMA  

## 2011-12-21 ENCOUNTER — Other Ambulatory Visit (INDEPENDENT_AMBULATORY_CARE_PROVIDER_SITE_OTHER): Payer: Medicare Other

## 2011-12-21 ENCOUNTER — Encounter: Payer: Self-pay | Admitting: Internal Medicine

## 2011-12-21 ENCOUNTER — Ambulatory Visit (INDEPENDENT_AMBULATORY_CARE_PROVIDER_SITE_OTHER): Payer: Medicare Other | Admitting: Internal Medicine

## 2011-12-21 VITALS — BP 118/68 | HR 83 | Ht 60.0 in | Wt 94.8 lb

## 2011-12-21 DIAGNOSIS — A319 Mycobacterial infection, unspecified: Secondary | ICD-10-CM

## 2011-12-21 DIAGNOSIS — J479 Bronchiectasis, uncomplicated: Secondary | ICD-10-CM

## 2011-12-21 DIAGNOSIS — A318 Other mycobacterial infections: Secondary | ICD-10-CM

## 2011-12-21 DIAGNOSIS — A310 Pulmonary mycobacterial infection: Secondary | ICD-10-CM

## 2011-12-21 LAB — HEPATIC FUNCTION PANEL
Albumin: 4.4 g/dL (ref 3.5–5.2)
Total Bilirubin: 0.7 mg/dL (ref 0.3–1.2)

## 2011-12-21 LAB — BASIC METABOLIC PANEL
BUN: 9 mg/dL (ref 6–23)
Calcium: 9.3 mg/dL (ref 8.4–10.5)
Creatinine, Ser: 0.6 mg/dL (ref 0.4–1.2)
GFR: 115.43 mL/min (ref >60.00)
Glucose, Bld: 88 mg/dL (ref 70–99)
Potassium: 3.9 mEq/L (ref 3.5–5.1)

## 2011-12-21 NOTE — Progress Notes (Signed)
04/18/11- 71 yo F never smoker referred by Dr Reynaldo Minium because of cough. Persistent cough since January. In late February she was treated with 7 days of Levaquin after chest x-ray showed some density. Cough is better in warm moist air, worse when out in pollen or when active and better at night. Cough improved with Levaquin but persists. Notices postnasal drip. Saw ENT/Dr.Wolicki who didn't think there was enough of sinus problem to be of concern. Cough is dry. She denies enlarged glands, fever, sweat or weight loss. Denies history of lung or nasal condition. She has been told that she has some wheeze in the right lung, going back years. She insists she has always been healthy and athletic, never aware of shortness of breath or breathing problems. Denies GERD. Remote history of walking pneumonia. As a small child mother had tuberculosis. No PPD in many years but not remembered  as positive. Chest x-ray 02/26/2011-right-sided infiltrate recommend followup. Chest x-ray 03/07/2011-COPD, prominent linear markings in the right middle lobe may be due to atelectasis or scarring but pneumonia can't be excluded.  05/11/11-  4 yoF never smoker followed for cough     PCP Dr Reynaldo Minium      Husband here Denies seasonal rhinitis complaints. Still some dry cough with a sense there is mucus in her throat. Dr Erik Obey ENT gave Z-Pak then Cleocin now for 30 days. She denies shortness [place] CT chest 04/18/11- reviewed w/ them-- IMPRESSION:  Diffuse peribronchovascular nodularity, right greater than left,  with areas of bronchiectasis and mild cavitation. 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 Follow-up of the  dominant cavitary lesion in the right lower lobe is recommended in  4-6 weeks to ensure stability, as a developing abscess cannot be  excluded. Similarly, although felt to be less likely, malignancy  cannot be definitively excluded and continued consideration on   follow-up exams is warranted.  Original Report Authenticated By: Luretha Rued, M.D.  PFT 05/10/11- mild obstructive airways disease, small airways, insignificant response to bronchodilator. air trapping, hyperinflation, normal diffusion FEV1 1.81/111%, FEV1/FVC 0.71, FEF 25-75% 1.19/59%. TLC 134%, RV 174%, DLCO 115%.  07/02/11- 70 yoF never smoker followed for cough, +MAIC/ triple therapy    PCP Dr Reynaldo Minium      Husband here Slow 1 little streak of blood in mucus as a single event. Mostly thin clear mucus now. Denies fever, night sweats, nodes. Of her 3 drugs, Biaxin bothers her the most and she thinks it is affecting her hearing. She wears hearing aids already, fitted by the audiologist at [facility] Dr. Kathrin Penner has already checked her eyes on ethambutol. Sputum Cx - 06/07/11- + M.avium CXR 06/12/11- reviewed IMPRESSION:  Stable radiographic appearance of the chest since 03/07/2011.  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 Continued follow-up of the right lower lobe cavitary  lesion is recommended.  Original Report Authenticated By: Randall An, M.D.   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 Feeling well. Not coughing at all-cough was her original complaint. Tolerating medications well as we reduced Biaxin from 1500 mg 3 days per week. That was because of concern about her hearing.  She suspects now there was no connection. She denies fever, sweat, chills. Scant sputum is clear. CXR 08/31/11- reviewed with her IMPRESSION:  1. Persistent cavitary lesion in the right lower lobe. Favor  Mycobacterium avium intracellular. Recommend ongoing radiographic  follow-up.  2. Similar appearance of interstitial thickening and right middle  lobe bronchiectasis.  Original Report Authenticated By: Consuello Bossier, M.D.   12/21/11- 69 yoF never smoker followed for cough, +MAIC/ triple therapy    PCP Dr Jacky Kindle  FOLLOWS FOR: recently in hospital-[place] 12/05/11 for hemoptysis; denies any wheezing, SOB, cough, or congestion CT 12/05/11- [facility]- RLL cavitary lesion, sub-centimeter satellite foci in RML, RLL, with COPD levaquin was added to her triple therapy ( which began 06/08/11). No prior or subsequent heme.  CXR 08/31/11 IMPRESSION:  1. Persistent cavitary lesion in the right lower lobe. Favor  Mycobacterium avium intracellular. Recommend ongoing radiographic  follow-up.  2. Similar appearance of interstitial thickening and right middle  lobe bronchiectasis.  Original Report Authenticated By: Consuello Bossier, M.D.       ROS-see HPI Constitutional:   No-   weight loss, night sweats, fevers, chills, fatigue, lassitude. HEENT:   No-  headaches, difficulty swallowing, tooth/dental problems, sore throat,       No-  sneezing, itching, ear ache, nasal congestion, post nasal drip,  CV:  No-   chest pain, orthopnea, PND, swelling in lower extremities, anasarca, dizziness, palpitations Resp: No-   shortness of breath with exertion or at rest.              + Little productive cough,  little non-productive cough,  No more coughing up of blood.              No-   change in color of mucus.  No- wheezing.   Skin: No-   rash or lesions. GI:  No-   heartburn, indigestion, abdominal pain, nausea, vomiting,  GU:  MS:  No-   joint pain or swelling.   Neuro-     nothing  unusual Psych:  No- change in mood or affect. No depression or anxiety.  No memory loss.  OBJ- Physical Exam BP 118/68  Pulse 83  Ht 5' (1.524 m)  Wt 94 lb 12.8 oz (43.001 kg)  BMI 18.51 kg/m2  SpO2 98% General- Alert, Oriented, Affect-appropriate, Distress- none acute, slender, talkative Skin- rash-none, lesions- none, excoriation- none Lymphadenopathy- none Head- atraumatic            Eyes- Gross vision intact, PERRLA, conjunctivae and secretions clear            Ears- Hearing aides            Nose- Clear, no-Septal dev, mucus, polyps, erosion, perforation             Throat- Mallampati II , mucosa clear , drainage- none, tonsils- atrophic Neck- flexible , trachea midline, no stridor , thyroid nl, carotid no bruit Chest - symmetrical excursion , unlabored           Heart/CV- RRR , no murmur , no  gallop  , no rub, nl s1 s2                           - JVD- none , edema- none, stasis changes- none, varices- none           Lung- +wheeze and squeaks right base, cough- none , dullness-none, rub- none           Chest wall-  Abd-  Br/ Gen/ Rectal- Not done, not indicated Extrem- cyanosis- none, clubbing, none, atrophy- none, strength- nl Neuro- grossly intact to observation

## 2011-12-21 NOTE — Patient Instructions (Addendum)
We will continue your triple therapy another three months and reassess in March.

## 2011-12-25 ENCOUNTER — Telehealth: Payer: Self-pay | Admitting: Internal Medicine

## 2011-12-25 NOTE — Telephone Encounter (Signed)
Notes Recorded by Waymon Budge, MD on 12/21/2011 at 4:37 PM Liver tests are ok without major disturbance from the antibiotics. Sodium level is just a little low, which can be seen with lung disease. Don't restrict salt -------  I spoke with patient about results and she verbalized understanding and had no questions. Pt is wanting to know if [facility] would write a letter stating she does not have TB. This is just for her records since what happened to her the last time she was in the hospital. She would like this mailed to her. Please advise Dr. Maple Hudson thanks

## 2011-12-26 ENCOUNTER — Encounter: Payer: Self-pay | Admitting: Internal Medicine

## 2011-12-26 NOTE — Telephone Encounter (Signed)
Letter printed and on CY's cart to sign; will send after signed.

## 2011-12-26 NOTE — Telephone Encounter (Signed)
Letter dictated- ready to sign and send.

## 2011-12-28 NOTE — Telephone Encounter (Signed)
Letter placed in mail; Mia Hernandez spoke with patient on my behalf(no voice) and pt aware.

## 2012-01-02 NOTE — Assessment & Plan Note (Signed)
Hemoptysis resolved after treatment with levaquin. Cavitary lesion in RLL is possible source. She will need long term radiologic surveillance.  She ask letter confirming diagnosis of MAIC, so she won't be isolated for TB risk.  She declines flu vax- discussed.

## 2012-01-02 NOTE — Assessment & Plan Note (Addendum)
We discussed duration of this treatment. Will go another 3 months Don't expect CXR scarring to change much with treatment.

## 2012-01-16 DIAGNOSIS — N6009 Solitary cyst of unspecified breast: Secondary | ICD-10-CM | POA: Diagnosis not present

## 2012-01-16 DIAGNOSIS — N952 Postmenopausal atrophic vaginitis: Secondary | ICD-10-CM | POA: Diagnosis not present

## 2012-02-01 ENCOUNTER — Telehealth: Payer: Self-pay | Admitting: Internal Medicine

## 2012-02-01 MED ORDER — CEFDINIR 300 MG PO CAPS: 300.0000 mg | ORAL_CAPSULE | Freq: Two times a day (BID) | ORAL | Status: DC

## 2012-02-01 NOTE — Addendum Note (Signed)
Addended by: Reynaldo Minium C on: 02/01/2012 11:49 AM   Modules accepted: Orders

## 2012-02-01 NOTE — Telephone Encounter (Signed)
I spoke with pt. She stated last night she spit up blood last night. It was 1 tsp full. It only happened once she was in the bed and felt like something was stuck in her throat and she went to the bathroom and this is when this happened. She has not gotten anything up since then. She was advised to call if this happened again. Please advise Dr. Maple Hudson thanks Last OV 12/21/11 Pending 03/20/12  Allergies  Allergen Reactions  . Latex Rash  . Aspirin   . Betadine (Povidone Iodine)   . Codeine   . Iodinated Diagnostic Agents   . Morphine And Related   . Penicillins   . Sulfa Antibiotics

## 2012-02-01 NOTE — Telephone Encounter (Signed)
Spoke with patient-aware that RX has been sent to her pharmacy.

## 2012-02-01 NOTE — Telephone Encounter (Signed)
Offer cefdinir 300 mg, # 14, 1 twice daily 

## 2012-02-11 DIAGNOSIS — Z1231 Encounter for screening mammogram for malignant neoplasm of breast: Secondary | ICD-10-CM | POA: Diagnosis not present

## 2012-03-07 ENCOUNTER — Telehealth: Payer: Self-pay | Admitting: Internal Medicine

## 2012-03-07 MED ORDER — CLARITHROMYCIN 500 MG PO TABS: 500.0000 mg | ORAL_TABLET | Freq: Two times a day (BID) | ORAL | Status: DC

## 2012-03-07 NOTE — Telephone Encounter (Signed)
Called and spoke with pt and she is aware of CY recs of her medications.  Pt stated that she will try this and if any problems she will call back. Nothing further is needed.

## 2012-03-07 NOTE — Telephone Encounter (Signed)
Per CY-recommend patient increase Biaxin to 500 mg BID when used up return to three times a week.

## 2012-03-07 NOTE — Telephone Encounter (Signed)
Per CY---  biaxin 500 mg  #14  1 po bid

## 2012-03-07 NOTE — Telephone Encounter (Signed)
Pt is already on Biaxin as part of her 3 drug therapy for [facility].  Please advise on Biaxin that was rx'ed today.  She currently takes Biaxin 3 times a week.  Please advise.

## 2012-03-07 NOTE — Telephone Encounter (Signed)
Pt aware of [facility] recs. rx has been sent. Nothing further was needed.

## 2012-03-07 NOTE — Telephone Encounter (Signed)
PT c/o of hemoptysis this morning.  Approx 1 tsp of bright red blood.  Pt reports having cough for past week with increase in mucus.  Denies fever or SOB.  PT finished Cefdinir 300mg  bid approx 3-4 weeks ago.  Please advise. Last Ov: 12-21-11    Next Ov:  03-20-12 Allergies  Allergen Reactions  . Latex Rash  . Aspirin   . Betadine (Povidone Iodine)   . Codeine   . Iodinated Diagnostic Agents   . Morphine And Related   . Penicillins   . Sulfa Antibiotics

## 2012-03-13 ENCOUNTER — Telehealth: Payer: Self-pay | Admitting: Internal Medicine

## 2012-03-13 NOTE — Telephone Encounter (Signed)
ATC patient to inform her of this.  Phone rang several times with no answer and no option to leave a voicemail. WCB

## 2012-03-13 NOTE — Telephone Encounter (Signed)
Spoke with patient, patient states she has appt 03/20/12 with Dr. Maple Hudson. Patient stated that at last appt she was told she was going to have full eval and reassessment.  Patient would like to know if she needs lab work/cxr done and if so if orders can be placed prior to appt so she can go down before. Dr. Maple Hudson please advise if patient needs blood work/cxr done prior to appt, thank you!  Patient states when we call her back if she is not home it is ok to leave detailed VM on answering machine.

## 2012-03-13 NOTE — Telephone Encounter (Signed)
Please inform patient that CY will see her for her follow up visit and then decide what(if any) labs or CXR are needed. Nothing to do prior to appointment. Thanks.

## 2012-03-17 NOTE — Telephone Encounter (Signed)
I spoke with pt and is aware. Nothing further was needed 

## 2012-03-20 ENCOUNTER — Ambulatory Visit (INDEPENDENT_AMBULATORY_CARE_PROVIDER_SITE_OTHER)
Admission: RE | Admit: 2012-03-20 | Discharge: 2012-03-20 | Disposition: A | Discharge status: Home / Self Care | Payer: Medicare Other | Source: Ambulatory Visit | Attending: Internal Medicine | Admitting: Internal Medicine

## 2012-03-20 ENCOUNTER — Ambulatory Visit (INDEPENDENT_AMBULATORY_CARE_PROVIDER_SITE_OTHER): Payer: Medicare Other | Admitting: Internal Medicine

## 2012-03-20 ENCOUNTER — Encounter: Payer: Self-pay | Admitting: Internal Medicine

## 2012-03-20 ENCOUNTER — Other Ambulatory Visit (INDEPENDENT_AMBULATORY_CARE_PROVIDER_SITE_OTHER): Payer: Medicare Other

## 2012-03-20 VITALS — BP 122/70 | HR 75 | Ht 60.0 in | Wt 98.6 lb

## 2012-03-20 DIAGNOSIS — A318 Other mycobacterial infections: Secondary | ICD-10-CM

## 2012-03-20 DIAGNOSIS — J8410 Pulmonary fibrosis, unspecified: Secondary | ICD-10-CM | POA: Diagnosis not present

## 2012-03-20 DIAGNOSIS — J42 Unspecified chronic bronchitis: Secondary | ICD-10-CM | POA: Diagnosis not present

## 2012-03-20 DIAGNOSIS — A319 Mycobacterial infection, unspecified: Secondary | ICD-10-CM

## 2012-03-20 DIAGNOSIS — J479 Bronchiectasis, uncomplicated: Secondary | ICD-10-CM

## 2012-03-20 LAB — BASIC METABOLIC PANEL WITH GFR
BUN: 9 mg/dL (ref 6–23)
CO2: 30 meq/L (ref 19–32)
Calcium: 9.5 mg/dL (ref 8.4–10.5)
Chloride: 95 meq/L — ABNORMAL LOW (ref 96–112)
Creatinine, Ser: 0.6 mg/dL (ref 0.4–1.2)
GFR: 110.69 mL/min
Glucose, Bld: 106 mg/dL — ABNORMAL HIGH (ref 70–99)
Potassium: 4 meq/L (ref 3.5–5.1)
Sodium: 134 meq/L — ABNORMAL LOW (ref 135–145)

## 2012-03-20 LAB — HEPATIC FUNCTION PANEL
ALT: 24 U/L (ref 0–35)
AST: 32 U/L (ref 0–37)
Albumin: 4.1 g/dL (ref 3.5–5.2)
Alkaline Phosphatase: 61 U/L (ref 39–117)
Bilirubin, Direct: 0.1 mg/dL (ref 0.0–0.3)
Total Bilirubin: 0.7 mg/dL (ref 0.3–1.2)
Total Protein: 7.4 g/dL (ref 6.0–8.3)

## 2012-03-20 LAB — CBC WITH DIFFERENTIAL/PLATELET
Basophils Absolute: 0 10*3/uL (ref 0.0–0.1)
Basophils Relative: 0.8 % (ref 0.0–3.0)
Eosinophils Absolute: 0.1 10*3/uL (ref 0.0–0.7)
HCT: 44.2 % (ref 36.0–46.0)
Hemoglobin: 15.1 g/dL — ABNORMAL HIGH (ref 12.0–15.0)
Lymphocytes Relative: 36.1 % (ref 12.0–46.0)
Lymphs Abs: 2.1 10*3/uL (ref 0.7–4.0)
MCHC: 34.2 g/dL (ref 30.0–36.0)
Neutro Abs: 3 10*3/uL (ref 1.4–7.7)
RBC: 4.67 Mil/uL (ref 3.87–5.11)
RDW: 13 % (ref 11.5–14.6)

## 2012-03-20 NOTE — Patient Instructions (Addendum)
Order- lab- BMET, CBC, Liver enzyme panel      Dx atypical AFB infection, chronic bronchitis             CXR              Sputum for AFB smear and culture  We will continue triple therapy for now

## 2012-03-20 NOTE — Progress Notes (Signed)
04/18/11- 72 yo F never smoker referred by Dr Reynaldo Minium because of cough. Persistent cough since January. In late February she was treated with 7 days of Levaquin after chest x-ray showed some density. Cough is better in warm moist air, worse when out in pollen or when active and better at night. Cough improved with Levaquin but persists. Notices postnasal drip. Saw ENT/Dr.Wolicki who didn't think there was enough of sinus problem to be of concern. Cough is dry. She denies enlarged glands, fever, sweat or weight loss. Denies history of lung or nasal condition. She has been told that she has some wheeze in the right lung, going back years. She insists she has always been healthy and athletic, never aware of shortness of breath or breathing problems. Denies GERD. Remote history of walking pneumonia. As a small child mother had tuberculosis. No PPD in many years but not remembered  as positive. Chest x-ray 02/26/2011-right-sided infiltrate recommend followup. Chest x-ray 03/07/2011-COPD, prominent linear markings in the right middle lobe may be due to atelectasis or scarring but pneumonia can't be excluded.  05/11/11-  4 yoF never smoker followed for cough     PCP Dr Reynaldo Minium      Husband here Denies seasonal rhinitis complaints. Still some dry cough with a sense there is mucus in her throat. Dr Erik Obey ENT gave Z-Pak then Cleocin now for 30 days. [place] chest 04/18/11- reviewed w/ them-- IMPRESSION:  Diffuse peribronchovascular nodularity, right greater than left,  with areas of bronchiectasis and mild cavitation. 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 Follow-up of the  dominant cavitary lesion in the right lower lobe is recommended in  4-6 weeks to ensure stability, as a developing abscess cannot be  excluded. Similarly, although felt to be less likely, malignancy  cannot be definitively excluded and continued consideration on   follow-up exams is warranted.  Original Report Authenticated By: Luretha Rued, M.D.  PFT 05/10/11- mild obstructive airways disease, small airways, insignificant response to bronchodilator. air trapping, hyperinflation, normal diffusion FEV1 1.81/111%, FEV1/FVC 0.71, FEF 25-75% 1.19/59%. TLC 134%, RV 174%, DLCO 115%.  07/02/11- 70 yoF never smoker followed for cough, +MAIC/ triple therapy    PCP Dr Reynaldo Minium      Husband here Slow 1 little streak of blood in mucus as a single event. Mostly thin clear mucus now. Denies fever, night sweats, nodes. Of her 3 drugs, Biaxin bothers her the most and she thinks it is affecting her hearing. She wears hearing aids already, fitted by the audiologist at [facility] Dr. Kathrin Penner has already checked her eyes on ethambutol. Sputum Cx - 06/07/11- + M.avium CXR 06/12/11- reviewed IMPRESSION:  Stable radiographic appearance of the chest since 03/07/2011.  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 Continued follow-up of the right lower lobe cavitary  lesion is recommended.  Original Report Authenticated By: Randall An, M.D.   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 Feeling well. Not coughing at all-cough was her original complaint. Tolerating medications well as we reduced Biaxin from 1500 mg 3 days per week. That was because of concern about her hearing.  She suspects now there was no connection. She denies fever, sweat, chills. Scant sputum is clear. CXR 08/31/11- reviewed with her IMPRESSION:  1. Persistent cavitary lesion in the right lower lobe. Favor  Mycobacterium avium intracellular. Recommend ongoing radiographic  follow-up.  2. Similar appearance of interstitial thickening and right middle  lobe bronchiectasis.  Original Report Authenticated By: Consuello Bossier, M.D.   12/21/11- 47 yoF never smoker followed for cough, +MAIC/ triple therapy    PCP Dr Jacky Kindle  FOLLOWS FOR: recently in hospital-[place] 12/05/11 for hemoptysis; denies any wheezing, SOB, cough, or congestion CT 12/05/11- [facility]- RLL cavitary lesion, sub-centimeter satellite foci in RML, RLL, with COPD levaquin was added to her triple therapy ( which began 06/18/11). No prior or subsequent heme.  CXR 08/31/11   IMPRESSION:  1. Persistent cavitary lesion in the right lower lobe. Favor  Mycobacterium avium intracellular. Recommend ongoing radiographic  follow-up.  2. Similar appearance of interstitial thickening and right middle  lobe bronchiectasis.  Original Report Authenticated By: Consuello Bossier, M.D.   03/20/12- 57 yoF never smoker followed for cough, +MAIC/ triple therapy began 06/18/11    PCP Dr Jacky Kindle   Husband here  FOLLOWS FOR: feels like she can breathe deeper than before, Had coughing (productive-in mornings;yellow/green to white in color) since last bleeding episode she had(3 times); also head cold recently.Continues [facility]. Third and latest hemoptysis was only slight- in January. Took extra biaxin then. Most days morning cough, scant, clear mucus. Just got over a cold 1 week ago.   ROS-see HPI Constitutional:   No-   weight loss, night sweats, fevers, chills, fatigue, lassitude. HEENT:   No-  headaches, difficulty swallowing, tooth/dental problems, sore throat,       No-  sneezing, itching, ear ache, nasal congestion,  post nasal drip,  CV:  No-   chest pain, orthopnea, PND, swelling in lower extremities, anasarca, dizziness, palpitations Resp: No-   shortness of breath with exertion or at rest.              + Little productive cough,  little non-productive cough,  +No more coughing up of blood.              No-   change in color of mucus.  No- wheezing.   Skin: No-   rash or lesions. GI:  No-   heartburn, indigestion, abdominal pain, nausea, vomiting,  GU:  MS:  No-   joint pain or swelling.   Neuro-     nothing unusual Psych:  No- change in mood or affect. No depression or anxiety.  No memory loss.  OBJ- Physical Exam BP 122/70  Pulse 75  Ht 5' (1.524 m)  Wt 98 lb 9.6 oz (44.725 kg)  BMI 19.26 kg/m2  SpO2 96% General- Alert, Oriented, Affect-appropriate, Distress- none acute, slender, talkative Skin- rash-none, lesions- none, excoriation- none Lymphadenopathy- none Head- atraumatic            Eyes- Gross vision intact, PERRLA, conjunctivae and secretions clear  Ears- Hearing aides            Nose- Clear, no-Septal dev, mucus, polyps, erosion, perforation             Throat- Mallampati II , mucosa clear , drainage- none, tonsils- atrophic Neck- flexible , trachea midline, no stridor , thyroid nl, carotid no bruit Chest - symmetrical excursion , unlabored           Heart/CV- RRR , no murmur , no gallop  , no rub, nl s1 s2                           - JVD- none , edema- none, stasis changes- none, varices- none           Lung- +wet squeaks R base and L apex., cough- none , dullness-none, rub- none           Chest wall-  Abd-  Br/ Gen/ Rectal- Not done, not indicated Extrem- cyanosis- none, clubbing, none, atrophy- none, strength- nl Neuro- grossly intact to observation

## 2012-03-21 ENCOUNTER — Other Ambulatory Visit: Payer: Medicare Other

## 2012-03-21 DIAGNOSIS — J42 Unspecified chronic bronchitis: Secondary | ICD-10-CM | POA: Diagnosis not present

## 2012-03-21 DIAGNOSIS — A318 Other mycobacterial infections: Secondary | ICD-10-CM | POA: Diagnosis not present

## 2012-03-24 LAB — RESPIRATORY CULTURE OR RESPIRATORY AND SPUTUM CULTURE: Organism ID, Bacteria: NORMAL

## 2012-03-24 NOTE — Assessment & Plan Note (Signed)
Most recent hemoptysis Jan, 2014. I explained this as a function of lung damage and acute nonspecific bronchitis. It doesn't necessarily imply active MAIC.

## 2012-03-24 NOTE — Progress Notes (Signed)
Quick Note:  Pt aware of results. ______ 

## 2012-03-24 NOTE — Assessment & Plan Note (Signed)
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

## 2012-04-07 ENCOUNTER — Telehealth: Payer: Self-pay | Admitting: Internal Medicine

## 2012-04-07 NOTE — Telephone Encounter (Signed)
Rec'd from [facility] forward 1 page to [facility] 04/07/12

## 2012-04-21 ENCOUNTER — Encounter: Payer: Self-pay | Admitting: Internal Medicine

## 2012-04-28 ENCOUNTER — Telehealth: Payer: Self-pay | Admitting: Internal Medicine

## 2012-04-28 ENCOUNTER — Ambulatory Visit (INDEPENDENT_AMBULATORY_CARE_PROVIDER_SITE_OTHER)
Admission: RE | Admit: 2012-04-28 | Discharge: 2012-04-28 | Disposition: A | Discharge status: Home / Self Care | Payer: Medicare Other | Source: Ambulatory Visit | Attending: Internal Medicine | Admitting: Internal Medicine

## 2012-04-28 DIAGNOSIS — A319 Mycobacterial infection, unspecified: Secondary | ICD-10-CM | POA: Diagnosis not present

## 2012-04-28 DIAGNOSIS — R042 Hemoptysis: Secondary | ICD-10-CM | POA: Diagnosis not present

## 2012-04-28 DIAGNOSIS — J984 Other disorders of lung: Secondary | ICD-10-CM | POA: Diagnosis not present

## 2012-04-28 NOTE — Telephone Encounter (Signed)
i spoke with pt and is aware of CDY recs. Order has been placed. Nothing further was needed

## 2012-04-28 NOTE — Telephone Encounter (Signed)
Per CY-lets order outpatient CXR here at our office for dx of 1)MAIC and 2) Hemoptysis

## 2012-04-28 NOTE — Telephone Encounter (Signed)
Called, spoke with pt.  Reports on Saturday she spit up 3 clots of bright red blood.  This happened again on Sunday morning and this morning.  Each clot was about the width of a pencil eraser but "not as thick."  States this has only happened each morning; none during the day.  Denies increased SOB, wheezing, chest tightness, chest pain, or f/c/s.  Currently on abx rotation with biaxin, myambutol, rifampin.  Dr. Maple Hudson, pls advise.  Thank you.  [facility]  Last OV with Dr. Maple Hudson: 03/20/12; asked to f/u in 2 months Pending OV with CDY 05/22/12  Allergies verified with pt:  Allergies  Allergen Reactions  . Latex Rash  . Aspirin   . Betadine (Povidone Iodine)   . Codeine   . Iodinated Diagnostic Agents   . Morphine And Related   . Penicillins   . Sulfa Antibiotics

## 2012-04-29 ENCOUNTER — Telehealth: Payer: Self-pay | Admitting: Internal Medicine

## 2012-04-29 MED ORDER — DOXYCYCLINE HYCLATE 100 MG PO TABS: ORAL_TABLET | ORAL | Status: DC

## 2012-04-29 NOTE — Telephone Encounter (Signed)
Spoke with patient made her aware of results and recs per CY as listed below. I have called in Rx to pharmacy and went over direction, pt verbalized understanding and nothing further needed at this time.   Result Note    CXR looks stable with no change in the cavity or scarring. Nothing looks active. Suggest we add doxycycline 100 mg, # 8, 2 today then one daily. Continue her triple therapy as before.

## 2012-05-01 ENCOUNTER — Other Ambulatory Visit: Payer: Self-pay | Admitting: Internal Medicine

## 2012-05-02 NOTE — Telephone Encounter (Signed)
Please advise if okay to refill RX? Thanks.

## 2012-05-02 NOTE — Telephone Encounter (Signed)
Ok to refill as before 

## 2012-05-02 NOTE — Progress Notes (Signed)
Quick Note:  Pt aware of results. And states that she has Doxycycline rx and has been taking. ______

## 2012-05-20 ENCOUNTER — Other Ambulatory Visit: Payer: Self-pay | Admitting: Internal Medicine

## 2012-05-20 DIAGNOSIS — A310 Pulmonary mycobacterial infection: Secondary | ICD-10-CM

## 2012-05-20 NOTE — Progress Notes (Signed)
Quick Note:  Pt is aware of results and ID referral has been placed. Pt will await a call from our PCC's about date/time and location. ______

## 2012-05-22 ENCOUNTER — Ambulatory Visit (INDEPENDENT_AMBULATORY_CARE_PROVIDER_SITE_OTHER): Payer: Medicare Other | Admitting: Internal Medicine

## 2012-05-22 ENCOUNTER — Encounter: Payer: Self-pay | Admitting: Internal Medicine

## 2012-05-22 VITALS — BP 130/82 | HR 82 | Ht 61.0 in | Wt 101.2 lb

## 2012-05-22 DIAGNOSIS — J479 Bronchiectasis, uncomplicated: Secondary | ICD-10-CM

## 2012-05-22 DIAGNOSIS — A318 Other mycobacterial infections: Secondary | ICD-10-CM | POA: Diagnosis not present

## 2012-05-22 DIAGNOSIS — A319 Mycobacterial infection, unspecified: Secondary | ICD-10-CM | POA: Diagnosis not present

## 2012-05-22 DIAGNOSIS — A310 Pulmonary mycobacterial infection: Secondary | ICD-10-CM

## 2012-05-22 NOTE — Progress Notes (Signed)
04/18/11- 72 yo F never smoker referred by Dr Reynaldo Minium because of cough. Persistent cough since January. In late February she was treated with 7 days of Levaquin after chest x-ray showed some density. Cough is better in warm moist air, worse when out in pollen or when active and better at night. Cough improved with Levaquin but persists. Notices postnasal drip. Saw ENT/Dr.Wolicki who didn't think there was enough of sinus problem to be of concern. Cough is dry. She denies enlarged glands, fever, sweat or weight loss. Denies history of lung or nasal condition. She has been told that she has some wheeze in the right lung, going back years. She insists she has always been healthy and athletic, never aware of shortness of breath or breathing problems. Denies GERD. Remote history of walking pneumonia. As a small child mother had tuberculosis. No PPD in many years but not remembered  as positive. Chest x-ray 02/26/2011-right-sided infiltrate recommend followup. Chest x-ray 03/07/2011-COPD, prominent linear markings in the right middle lobe may be due to atelectasis or scarring but pneumonia can't be excluded.  05/11/11-  72 yoF never smoker followed for cough     PCP Dr Reynaldo Minium      Husband here Denies seasonal rhinitis complaints. Still some dry cough with a sense there is mucus in her throat. Dr Erik Obey ENT gave Z-Pak then Cleocin now for 30 days. She denies shortness [place] CT chest 04/18/11- reviewed w/ them-- IMPRESSION:  Diffuse peribronchovascular nodularity, right greater than left,  with areas of bronchiectasis and mild cavitation. 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 Follow-up of the  dominant cavitary lesion in the right lower lobe is recommended in  4-6 weeks to ensure stability, as a developing abscess cannot be  excluded. Similarly, although felt to be less likely, malignancy  cannot be definitively excluded and continued consideration on   follow-up exams is warranted.  Original Report Authenticated By: Luretha Rued, M.D.  PFT 05/10/11- mild obstructive airways disease, small airways, insignificant response to bronchodilator. air trapping, hyperinflation, normal diffusion FEV1 1.81/111%, FEV1/FVC 0.71, FEF 25-75% 1.19/59%. TLC 134%, RV 174%, DLCO 115%.  07/02/11- 72 yoF never smoker followed for cough, +MAIC/ triple therapy    PCP Dr Reynaldo Minium      Husband here Slow 1 little streak of blood in mucus as a single event. Mostly thin clear mucus now. Denies fever, night sweats, nodes. Of her 3 drugs, Biaxin bothers her the most and she thinks it is affecting her hearing. She wears hearing aids already, fitted by the audiologist at [facility] Dr. Kathrin Penner has already checked her eyes on ethambutol. Sputum Cx - 06/07/11- + M.avium CXR 06/12/11- reviewed IMPRESSION:  Stable radiographic appearance of the chest since 03/07/2011.  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 Continued follow-up of the right lower lobe cavitary  lesion is recommended.  Original Report Authenticated By: Randall An, M.D.   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 Feeling well. Not coughing at all-cough was her original complaint. Tolerating medications well as we reduced Biaxin from 1500 mg 3 days per week. That was because of concern about her hearing.  She suspects now there was no connection. She denies fever, sweat, chills. Scant sputum is clear. CXR 08/31/11- reviewed with her IMPRESSION:  1. Persistent cavitary lesion in the right lower lobe. Favor  Mycobacterium avium intracellular. Recommend ongoing radiographic  follow-up.  2. Similar appearance of interstitial thickening and right middle  lobe bronchiectasis.  Original Report Authenticated By: Consuello Bossier, M.D.   12/21/11- 72 yoF never smoker followed for cough, +MAIC/ triple therapy    PCP Dr Jacky Kindle  FOLLOWS FOR: recently in hospital-[place] 12/05/11 for hemoptysis; denies any wheezing, SOB, cough, or congestion CT 12/05/11- [place] [facility]- RLL cavitary lesion, sub-centimeter satellite foci in RML, RLL, with COPD levaquin was added to her triple therapy ( which began 06/18/11). No prior or subsequent heme.  CXR 08/31/11   IMPRESSION:  1. Persistent cavitary lesion in the right lower lobe. Favor  Mycobacterium avium intracellular. Recommend ongoing radiographic  follow-up.  2. Similar appearance of interstitial thickening and right middle  lobe bronchiectasis.  Original Report Authenticated By: Consuello Bossier, M.D.   03/20/12- 72 yoF never smoker followed for cough, +MAIC/ triple therapy began 06/18/11    PCP Dr Jacky Kindle   Husband here  FOLLOWS FOR: feels like she can breathe deeper than before, Had coughing (productive-in mornings;yellow/green to white in color) since last bleeding episode she had(3 times); also head cold recently.Continues triple therapy for [facility]. Third and latest hemoptysis was only slight- in January. Took extra biaxin then. Most days morning cough, scant, clear mucus. Just got over a cold 1 week ago.   05/22/12- 71 yoF never smoker followed for cough, +MAIC/ triple therapy began 06/18/11    PCP Dr Jacky Kindle   Husband here FOLLOWS FOR: Morning times-has cough spell and gets little aount of phlegm-lite in color. Sputum Cx AFB again [facility]  03/21/12 She has felt somewhat better in the last few weeks with more energy but still productive cough with scant yellow sputum. No sweats or fevers, adenopathy, or recent hemoptysis CXR 04/28/12 IMPRESSION:  1. No significant change in cavitary lung lesion within the right  lower lobe.  2. Chronic lung abnormality is stable compared with previous exam.  Original Report Authenticated By: Signa Kell, M.D.  ROS-see HPI Constitutional:   No-   weight loss, night sweats, fevers, chills, fatigue, lassitude. HEENT:   No-  headaches, difficulty swallowing, tooth/dental problems, sore throat,       No-  sneezing, itching, ear ache, nasal congestion, post nasal drip,  CV:  No-   chest pain, orthopnea, PND, swelling in lower extremities, anasarca, dizziness, palpitations Resp: No-   shortness of breath with exertion or at rest.              +  productive cough,  little non-productive cough,  +No more coughing up of blood.              No-   change in color of mucus.  No- wheezing.   Skin: No-   rash or lesions. GI:  No-   heartburn, indigestion, abdominal pain, nausea, vomiting,  GU:  MS:  No-   joint pain or swelling.   Neuro-     nothing unusual Psych:  No- change in mood or affect. No depression or anxiety.  No memory loss.  OBJ- Physical Exam BP 130/82  Pulse 82  Ht 5\' 1"  (1.549 m)  Wt 101 lb 3.2 oz (45.904 kg)  BMI 19.13 kg/m2  SpO2 97% General- Alert, Oriented, Affect-appropriate, Distress- none acute, slender, talkative Skin- rash-none, lesions- none, excoriation- none Lymphadenopathy- none Head- atraumatic            Eyes- Gross vision intact, PERRLA, conjunctivae and secretions clear            Ears- Hearing aides            Nose- Clear, no-Septal dev, mucus, polyps, erosion, perforation             Throat- Mallampati II , mucosa clear , drainage- none, tonsils- atrophic Neck- flexible , trachea midline, no stridor , thyroid nl, carotid no bruit Chest - symmetrical excursion  , unlabored           Heart/CV- RRR , no murmur , no gallop  , no rub, nl s1 s2                           - JVD- none , edema- none, stasis changes- none, varices- none           Lung- +squeaks and wheezes right lung, cough- none , dullness-none, rub- none           Chest wall-  Abd-  Br/ Gen/ Rectal- Not done, not indicated Extrem- cyanosis- none, clubbing, none, atrophy- none, strength- nl Neuro- grossly intact to observation

## 2012-05-22 NOTE — Patient Instructions (Addendum)
Order- CT chest, no contrast      Dx MAIC cavitary lung disease, bronchiectasis  Keep appointment with Infectious Disease clinic  Please call as needed

## 2012-05-27 ENCOUNTER — Ambulatory Visit (INDEPENDENT_AMBULATORY_CARE_PROVIDER_SITE_OTHER)
Admission: RE | Admit: 2012-05-27 | Discharge: 2012-05-27 | Disposition: A | Discharge status: Home / Self Care | Payer: Medicare Other | Source: Ambulatory Visit | Attending: Internal Medicine | Admitting: Internal Medicine

## 2012-05-27 DIAGNOSIS — A318 Other mycobacterial infections: Secondary | ICD-10-CM | POA: Diagnosis not present

## 2012-05-27 DIAGNOSIS — J479 Bronchiectasis, uncomplicated: Secondary | ICD-10-CM | POA: Diagnosis not present

## 2012-05-27 DIAGNOSIS — A310 Pulmonary mycobacterial infection: Secondary | ICD-10-CM

## 2012-06-02 NOTE — Assessment & Plan Note (Signed)
This appears to be a treatment failure with positive sputum after almost a year on triple therapy. Plan-infectious disease consult for second opinion on management of Mycobacterium avium intracellulare. Need to verify that we sent sputum for AFB sensitivities. CT scan of chest for status of cavitary and bronchiectatic disease.

## 2012-06-03 ENCOUNTER — Telehealth: Payer: Self-pay | Admitting: Internal Medicine

## 2012-06-03 NOTE — Telephone Encounter (Signed)
LMTCB x 1 

## 2012-06-04 ENCOUNTER — Encounter: Payer: Self-pay | Admitting: Infectious Disease

## 2012-06-04 ENCOUNTER — Ambulatory Visit (INDEPENDENT_AMBULATORY_CARE_PROVIDER_SITE_OTHER): Payer: Medicare Other | Admitting: Infectious Disease

## 2012-06-04 VITALS — BP 192/100 | HR 84 | Temp 97.6°F | Ht 60.0 in | Wt 99.0 lb

## 2012-06-04 DIAGNOSIS — A319 Mycobacterial infection, unspecified: Secondary | ICD-10-CM

## 2012-06-04 DIAGNOSIS — J479 Bronchiectasis, uncomplicated: Secondary | ICD-10-CM | POA: Diagnosis not present

## 2012-06-04 DIAGNOSIS — A310 Pulmonary mycobacterial infection: Secondary | ICD-10-CM | POA: Insufficient documentation

## 2012-06-04 DIAGNOSIS — J984 Other disorders of lung: Secondary | ICD-10-CM

## 2012-06-04 DIAGNOSIS — R042 Hemoptysis: Secondary | ICD-10-CM

## 2012-06-04 MED ORDER — ETHAMBUTOL HCL 100 MG PO TABS: 300.0000 mg | ORAL_TABLET | Freq: Every day | ORAL | Status: DC

## 2012-06-04 MED ORDER — ETHAMBUTOL HCL 400 MG PO TABS: 400.0000 mg | ORAL_TABLET | Freq: Every day | ORAL | Status: DC

## 2012-06-04 MED ORDER — AZITHROMYCIN 500 MG PO TABS: 500.0000 mg | ORAL_TABLET | Freq: Every day | ORAL | Status: DC

## 2012-06-04 MED ORDER — RIFAMPIN 300 MG PO CAPS: 600.0000 mg | ORAL_CAPSULE | Freq: Every day | ORAL | Status: DC

## 2012-06-04 NOTE — Patient Instructions (Signed)
YOUR NEW REGIMEN FOR MAC IS:   AZITHROMYCIN 500MG  DAILY  ETHAMBUTOL 400MG  PLUS THREE 100MG  TABLETS == FOR TOTAL DOSE OF 700MG   DAILYI  AND  RIFAMPIN TWO 300MG  TABLETS= TOTAL DOSE 600MG  DAILY

## 2012-06-04 NOTE — Telephone Encounter (Signed)
Notes Recorded by Waymon Budge, MD on 05/27/2012 at 8:18 PM CT chest- The Mycobacterium avium still looks active and the cavity has grown a little. It is appropriate to refer Mia Hernandez to the [facility] for their opinion. LMTCB on home and cell number. Carron Curie, CMA

## 2012-06-04 NOTE — Progress Notes (Signed)
Subjective:    Patient ID: Mia Hernandez, female    DOB: Apr 05, 1940, 72 y.o.   MRN: [id number]  HPI  Consult for Mia Hernandez  Consulting MD: Dr. 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 She started on therapy with clarithromycin rifampin and ethambutol 3 days per week. She had great difficulty tolerating the Biaxin which was she was taking it twice daily doses on the day she was taking it was reduced to once daily dosing on June 24. Apparently at that time there was concern about toxicity although she states that now that she was found to need simply new hearing aids. Initially while on therapy for Mycobacterium avium should improvement in her symptoms of chronic daily cough and sputum production. However this spring she is having worsening cough with copious sputum production.  This fall in [place] apparently she had episodes of hemoptysis and was hospitalized due to Hospital in [place] where she was placed in isolation for rule out tuberculosis. Ultimately was realized that she was suffering from Mycobacterium avium infection alone.   In fact she notices is worsening of productive phlegm on days where she is taking her drugs. She had repeat sputum taken this spring and it is again grow Mycobacterium avium. The organism has not yet been sent for susceptibility testing but have asked for ileus and the microbiology lab to send it for testing and susceptibilities.  We had extensive discussion with regards to various drugs albeit limit what limited ones that can be given for Mycobacterium avium infection and I spent greater than 45 minutes with the patient regarding her infection and drugs and toxicities that might be involved  including greater than 50% of time in face to face counsel of the patient and in coordination of their care. She has had a recent CT scan of the lungs  which has shown an increase in the cavity in the right lower  lung. Her diffuse nodular disease apparently is stable.  I think she would benefit from being put on daily therapy with macrolide ethambutol and rifampin. I also think she would benefit from being changed from clarithromycin to azithromycin I would like to push this to a higher dose of 500 mg of azithromycin daily.  Review of Systems  Constitutional: Negative for fever, chills, diaphoresis, activity change, appetite change, fatigue and unexpected weight change.  HENT: Negative for congestion, sore throat, rhinorrhea, sneezing, trouble swallowing and sinus pressure.   Eyes: Negative for photophobia and visual disturbance.  Respiratory: Positive for cough and shortness of breath. Negative for chest tightness, wheezing and stridor.   Cardiovascular: Negative for chest pain, palpitations and leg swelling.  Gastrointestinal: Negative for nausea, vomiting, abdominal pain, diarrhea, constipation, blood in stool, abdominal distention and anal bleeding.  Genitourinary: Negative for dysuria, hematuria, flank pain and difficulty urinating.  Musculoskeletal: Negative for myalgias, back pain, joint swelling, arthralgias and gait problem.  Skin: Negative for color change, pallor, rash and wound.  Neurological: Negative for dizziness, tremors, weakness and light-headedness.  Hematological: Negative for adenopathy. Does not bruise/bleed easily.  Psychiatric/Behavioral: Negative for behavioral problems, confusion, sleep disturbance, dysphoric mood, decreased concentration and agitation.       Objective:   Physical Exam  Constitutional: She is oriented to person, place, and time. No distress.  HENT:  Head: Normocephalic and atraumatic.  Mouth/Throat: Oropharynx is clear and moist. No oropharyngeal exudate.  Eyes:  Conjunctivae and EOM are normal. Pupils are equal, round, and reactive to light. No scleral icterus.  Neck: Normal range of motion.  Neck supple. No JVD present.  Cardiovascular: Normal rate, regular rhythm and normal heart sounds.  Exam reveals no gallop and no friction rub.   No murmur heard. Pulmonary/Chest: Effort normal. No respiratory distress. She has wheezes. She has no rales. She exhibits no tenderness.  Prolonged expiratory phase  Abdominal: She exhibits no distension and no mass. There is no tenderness. There is no rebound and no guarding.  Musculoskeletal: She exhibits no edema and no tenderness.  Lymphadenopathy:    She has no cervical adenopathy.  Neurological: She is alert and oriented to person, place, and time. She has normal reflexes. She exhibits normal muscle tone. Coordination normal.  Skin: Skin is warm and dry. She is not diaphoretic. No erythema. No pallor.  Psychiatric: She has a normal mood and affect. Her behavior is normal. Judgment and thought content normal.          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  #2 Bronchiectasis: due to #1 with cavity: there is risk of fungal infection prickly for aspergilloma in this cavity to be need to be watched carefully. --Need to continue to have efforts made to maximize expectoration of sputum and "pulmonary toilet"

## 2012-06-05 ENCOUNTER — Encounter: Payer: Self-pay | Admitting: Physician Assistant

## 2012-06-05 ENCOUNTER — Ambulatory Visit (INDEPENDENT_AMBULATORY_CARE_PROVIDER_SITE_OTHER): Payer: Medicare Other | Admitting: Physician Assistant

## 2012-06-05 VITALS — BP 178/106 | Ht 60.0 in | Wt 100.2 lb

## 2012-06-05 DIAGNOSIS — I10 Essential (primary) hypertension: Secondary | ICD-10-CM | POA: Insufficient documentation

## 2012-06-05 MED ORDER — HYDRALAZINE HCL 25 MG PO TABS: 25.0000 mg | ORAL_TABLET | Freq: Two times a day (BID) | ORAL | Status: DC

## 2012-06-05 NOTE — Progress Notes (Signed)
Date:  06/05/2012   ID:  Jama Flavors, DOB 1940/03/15, MRN [id number]  PCP:  Minda Meo, MD  Primary Cardiologist:  Croitor[facility]     History of Present Illness: Mia Hernandez is a 72 y.o. female with a history of Mycobacterium avium for the past year and a half treated with azithromycin, ethambutol, rifampin. Recent changes in doses as of yesterday by Dr. Daiva Eves.  Her history also includes osteoporosis chronic back pain bronchiectasis. She presents today with elevated blood pressures for the last couple weeks. Systolic blood pressures range from 132 and 192 with a diastolic of 69-100.  Her blood pressure in clinic today is 170/106.  Patient states her " throat feels full all the time". She's has continued problems with coughing and hemoptysis.  She denies nausea, vomiting, fever, headache, dizziness, chest pain, shortness of breath, or extremity edema.  She is very active working out in the garden for a good part of the day. She is very anxious however, and is upset that the infection has not been cured.    Wt Readings from Last 3 Encounters:  06/05/12 100 lb 3.2 oz (45.45 kg)  06/04/12 99 lb (44.906 kg)  05/22/12 101 lb 3.2 oz (45.904 kg)     Past Medical History  Diagnosis Date  . Osteoporosis   . ALLERGIC RHINITIS   . Chronic back pain   . Mycobacterium avium-intracellulare infection   . Bronchiectasis     Current Outpatient Prescriptions  Medication Sig Dispense Refill  . azithromycin (ZITHROMAX) 500 MG tablet Take 1 tablet (500 mg total) by mouth daily.  30 tablet  11  . ethambutol (MYAMBUTOL) 100 MG tablet Take 3 tablets (300 mg total) by mouth daily.  90 tablet  11  . ethambutol (MYAMBUTOL) 400 MG tablet Take 700 mg by mouth daily.      . hydrALAZINE (APRESOLINE) 25 MG tablet Take 1 tablet (25 mg total) by mouth 2 (two) times daily.  60 tablet  3  . rifampin (RIFADIN) 300 MG capsule Take 2 capsules (600 mg total) by mouth daily.  60 capsule  11   No current  facility-administered medications for this visit.    Allergies:    Allergies  Allergen Reactions  . Latex Rash  . Aspirin   . Betadine (Povidone Iodine)   . Codeine   . Iodinated Diagnostic Agents   . Morphine And Related   . Penicillins   . Prednisone     "out of mind" state  . Sulfa Antibiotics     Social History:  The patient  reports that she has never smoked. She does not have any smokeless tobacco history on file. She reports that she drinks about 0.6 ounces of alcohol per week. She reports that she does not use illicit drugs.   ROS:  Please see the history of present illness.  All other systems reviewed and negative.   PHYSICAL EXAM: VS:  BP 178/106  Ht 5' (1.524 m)  Wt 100 lb 3.2 oz (45.45 kg)  BMI 19.57 kg/m2 Well nourished, well developed, in no acute distress HEENT: Pupils are equal round react to light accommodation extraocular movements are intact.  Neck: no JVDNo cervical lymphadenopathy. Cardiac: Regular rate and rhythm without murmurs rubs or gallops. Lungs:  Inspiratory wheeze greater on the right lower.  No rhonchi or rales Ext: no lower extremity edema.  2+ radial and dorsalis pedis pulses. Skin: warm and dry Neuro:  Grossly normal  EKG:  Sinus rhythm rate 90 beats per  minute small RSR prime V1  ASSESSMENT AND PLAN:  Problem List Items Addressed This Visit   Hypertension     Patient is prescribed hydralazine 25 mg twice daily. I have asked her to monitor her blood pressure once daily and alternate between morning afternoon and evening. She'll receive return to see Dr. Royann Shivers in 1 month    Relevant Medications      hydrALAZINE (APRESOLINE) tablet    Other Visit Diagnoses   HTN (hypertension)    -  Primary    Relevant Medications       hydrALAZINE (APRESOLINE) tablet    Other Relevant Orders       EKG 12-Lead

## 2012-06-05 NOTE — Telephone Encounter (Signed)
Pt is advised of results. Pt saw ID doctor yesterday. Carron Curie, CMA

## 2012-06-05 NOTE — Patient Instructions (Signed)
Patient blood pressure once daily alternate between morning, early afternoon, evening until seen at next appointment.

## 2012-06-05 NOTE — Assessment & Plan Note (Signed)
Patient is prescribed hydralazine 25 mg twice daily. I have asked her to monitor her blood pressure once daily and alternate between morning afternoon and evening. She'll receive return to see Dr. Royann Shivers in 1 month

## 2012-06-06 ENCOUNTER — Other Ambulatory Visit: Payer: Self-pay | Admitting: *Deleted

## 2012-06-06 DIAGNOSIS — A319 Mycobacterial infection, unspecified: Secondary | ICD-10-CM

## 2012-06-06 MED ORDER — ETHAMBUTOL HCL 100 MG PO TABS: 300.0000 mg | ORAL_TABLET | Freq: Every day | ORAL | Status: DC

## 2012-06-06 MED ORDER — AZITHROMYCIN 500 MG PO TABS: 500.0000 mg | ORAL_TABLET | Freq: Every day | ORAL | Status: DC

## 2012-06-06 MED ORDER — ETHAMBUTOL HCL 400 MG PO TABS: 400.0000 mg | ORAL_TABLET | Freq: Every day | ORAL | Status: DC

## 2012-06-06 MED ORDER — RIFAMPIN 300 MG PO CAPS: 600.0000 mg | ORAL_CAPSULE | Freq: Every day | ORAL | Status: DC

## 2012-06-06 NOTE — Progress Notes (Signed)
Meds sent to prescription delivery service. Refills inactivated at [facility].

## 2012-06-10 ENCOUNTER — Other Ambulatory Visit: Payer: Self-pay | Admitting: Licensed Clinical Social Worker

## 2012-06-10 DIAGNOSIS — A319 Mycobacterial infection, unspecified: Secondary | ICD-10-CM

## 2012-06-10 DIAGNOSIS — I10 Essential (primary) hypertension: Secondary | ICD-10-CM

## 2012-06-10 MED ORDER — ETHAMBUTOL HCL 400 MG PO TABS: 400.0000 mg | ORAL_TABLET | Freq: Every day | ORAL | Status: DC

## 2012-06-10 MED ORDER — AZITHROMYCIN 500 MG PO TABS: 500.0000 mg | ORAL_TABLET | Freq: Every day | ORAL | Status: DC

## 2012-06-10 MED ORDER — RIFAMPIN 300 MG PO CAPS: 600.0000 mg | ORAL_CAPSULE | Freq: Every day | ORAL | Status: DC

## 2012-06-10 MED ORDER — ETHAMBUTOL HCL 100 MG PO TABS: 300.0000 mg | ORAL_TABLET | Freq: Every day | ORAL | Status: DC

## 2012-06-10 NOTE — Telephone Encounter (Signed)
Patient called wanting 90 day supply on all medications. [facility] for her.

## 2012-06-24 LAB — AFB CULTURE WITH SMEAR (NOT AT ARMC): Acid Fast Smear: NONE SEEN

## 2012-06-25 LAB — REFERRED ASSAY

## 2012-07-01 ENCOUNTER — Ambulatory Visit: Payer: Medicare Other | Admitting: Cardiovascular Disease

## 2012-07-04 NOTE — Progress Notes (Signed)
Quick Note:  Pt already aware of results and treated accordingly. Will sign off on results. ______

## 2012-07-09 ENCOUNTER — Telehealth: Payer: Self-pay | Admitting: Infectious Disease

## 2012-07-09 DIAGNOSIS — M899 Disorder of bone, unspecified: Secondary | ICD-10-CM | POA: Diagnosis not present

## 2012-07-09 DIAGNOSIS — M810 Age-related osteoporosis without current pathological fracture: Secondary | ICD-10-CM | POA: Diagnosis not present

## 2012-07-09 NOTE — Telephone Encounter (Signed)
Received results on Sensitivity data on pts M Avium culture  M Avium per [employer]:  M Avium   Amikacin MIC = 16 Ciprofloxacin MIC = 16  Clarithromycin MIC = 2 SENSITIVE (HENCE AZITHRO SENSITIVE)\  Ethambutol MIC  = 8  Ethiamide MIC  >20  Rifampin MIC >8  Rifabutin MIC  = 0.52  Moxifloxacin  R MIC = 4  Linezolid I MIC = 16  COMBO DATA:  ETH + Clarithro (MCG/ML = 8)--> NO GROWTH  ETH + Clarithro (MCG/ML = 16)--> NO GROWTH  ETH + ERYRTHO (3 MCG/Ml) --> NO GROWTH  ETH + Rifampin  (MCG/ML = 1)  --> NO GROWTH ETH + Rifampin  (MCG/ML = 5)  --> NO GROWTH  Conclusion wit NO MACROLIDE Resistance demonstrated, continue with current therapy

## 2012-07-17 ENCOUNTER — Ambulatory Visit: Payer: Medicare Other | Admitting: Infectious Disease

## 2012-07-17 DIAGNOSIS — Z681 Body mass index (BMI) 19 or less, adult: Secondary | ICD-10-CM | POA: Diagnosis not present

## 2012-07-17 DIAGNOSIS — A318 Other mycobacterial infections: Secondary | ICD-10-CM | POA: Diagnosis not present

## 2012-07-17 DIAGNOSIS — Z0000 Encounter for general adult medical examination without abnormal findings: Secondary | ICD-10-CM | POA: Diagnosis not present

## 2012-07-17 DIAGNOSIS — Z1331 Encounter for screening for depression: Secondary | ICD-10-CM | POA: Diagnosis not present

## 2012-07-17 DIAGNOSIS — M810 Age-related osteoporosis without current pathological fracture: Secondary | ICD-10-CM | POA: Diagnosis not present

## 2012-07-23 ENCOUNTER — Ambulatory Visit (INDEPENDENT_AMBULATORY_CARE_PROVIDER_SITE_OTHER): Payer: Medicare Other | Admitting: Infectious Disease

## 2012-07-23 ENCOUNTER — Encounter: Payer: Self-pay | Admitting: Infectious Disease

## 2012-07-23 VITALS — BP 197/108 | HR 74 | Temp 97.5°F | Ht 60.0 in | Wt 101.0 lb

## 2012-07-23 DIAGNOSIS — A319 Mycobacterial infection, unspecified: Secondary | ICD-10-CM | POA: Diagnosis not present

## 2012-07-23 DIAGNOSIS — T50904A Poisoning by unspecified drugs, medicaments and biological substances, undetermined, initial encounter: Secondary | ICD-10-CM

## 2012-07-23 DIAGNOSIS — F1010 Alcohol abuse, uncomplicated: Secondary | ICD-10-CM

## 2012-07-23 DIAGNOSIS — I10 Essential (primary) hypertension: Secondary | ICD-10-CM

## 2012-07-23 DIAGNOSIS — F19921 Other psychoactive substance use, unspecified with intoxication with delirium: Secondary | ICD-10-CM

## 2012-07-23 DIAGNOSIS — Z7289 Other problems related to lifestyle: Secondary | ICD-10-CM

## 2012-07-23 LAB — CBC WITH DIFFERENTIAL/PLATELET
Eosinophils Relative: 6 % — ABNORMAL HIGH (ref 0–5)
HCT: 43.7 % (ref 36.0–46.0)
Lymphocytes Relative: 37 % (ref 12–46)
Lymphs Abs: 1.8 10*3/uL (ref 0.7–4.0)
MCV: 91.8 fL (ref 78.0–100.0)
Monocytes Absolute: 0.6 10*3/uL (ref 0.1–1.0)
Neutro Abs: 2.2 10*3/uL (ref 1.7–7.7)
RBC: 4.76 MIL/uL (ref 3.87–5.11)
WBC: 5 10*3/uL (ref 4.0–10.5)

## 2012-07-23 LAB — COMPLETE METABOLIC PANEL WITH GFR
AST: 35 U/L (ref 0–37)
Alkaline Phosphatase: 72 U/L (ref 39–117)
BUN: 9 mg/dL (ref 6–23)
Creat: 0.57 mg/dL (ref 0.50–1.10)
GFR, Est Non African American: >89 mL/min
Glucose, Bld: 90 mg/dL (ref 70–99)
Total Bilirubin: 0.5 mg/dL (ref 0.3–1.2)

## 2012-07-23 NOTE — Progress Notes (Signed)
Subjective:    Patient ID: Mia Hernandez, female    DOB: 1940-11-27, 72 y.o.   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 She started on therapy with clarithromycin rifampin and ethambutol 3 days per week. She had great difficulty tolerating the Biaxin which was she was taking it twice daily doses on the day she was taking it was reduced to once daily dosing on June 24. Apparently at that time there was concern about toxicity although she states that now that she was found to need simply new hearing aids. Initially while on therapy for Mycobacterium avium showed mprovement in her symptoms of chronic daily cough and sputum production. However this spring she is having worsening cough with copious sputum production.  This fall in [place] apparently she had episodes of hemoptysis and was hospitalized due to Hospital in [place] where she was placed in isolation for rule out tuberculosis. Ultimately was realized that she was suffering from Mycobacterium avium infection alone.   In fact she notices is worsening of productive phlegm on days where she is taking her drugs. She had repeat sputum taken this spring and it is again grow Mycobacterium avium. The organism has not yet been sent for susceptibility testing and showed macrolide sensitivity and in vitro synergy with rifampin and ETH in inhibiting growth in lab.  She has had a recent CT scan of the lungs which has shown an increase in the cavity in the right lower  lung. Her diffuse nodular disease apparently is stable.  I  Changed her to  daily therapy with macrolide (Azithromycin) ethambutol and rifampin. Since change she is coughing less, she is gaining weight and is absent fevers. She has had no further hemoptysis, and less burning with deep breathing in the am.  Her BP is VERY elevated again and this is being  followed by her cardiologist who has rx bid hydralazine which she takes intermittently.  She states that early in the am after rifampin dose she has noted fleeting confusion that then resolves within an hour. Otherwise she feels relatively well. She does drink one glass of red wine nightly.  Review of Systems  Constitutional: Negative for fever, chills, diaphoresis, activity change, appetite change, fatigue and unexpected weight change.  HENT: Negative for congestion, sore throat, rhinorrhea, sneezing, trouble swallowing and sinus pressure.   Eyes: Negative for photophobia and visual disturbance.  Respiratory: Positive for cough. Negative for chest tightness, shortness of breath, wheezing and stridor.   Cardiovascular: Negative for chest pain, palpitations and leg swelling.  Gastrointestinal: Negative for nausea, vomiting, abdominal pain, diarrhea, constipation, blood in stool, abdominal distention and anal bleeding.  Genitourinary: Negative for dysuria, hematuria, flank pain and difficulty urinating.  Musculoskeletal: Negative for myalgias, back pain, joint swelling, arthralgias and gait problem.  Skin: Negative for color change, pallor, rash and wound.  Neurological: Negative for dizziness, tremors, weakness and light-headedness.  Hematological: Negative for adenopathy. Does not bruise/bleed easily.  Psychiatric/Behavioral: Positive for confusion. Negative for behavioral problems, sleep disturbance, dysphoric mood, decreased concentration and agitation.       Objective:   Physical Exam  Constitutional: She is oriented to person, place, and time. No distress.  HENT:  Head: Normocephalic and atraumatic.  Mouth/Throat: Oropharynx is clear and moist. No oropharyngeal exudate.  Eyes: Conjunctivae and EOM are normal. Pupils are equal, round, and reactive to light. No scleral icterus.  Neck: Normal range of motion. Neck supple. No JVD present.  Cardiovascular: Normal rate, regular rhythm and  normal heart sounds.  Exam reveals no gallop and no friction rub.   No murmur heard. Pulmonary/Chest: No respiratory distress. She has wheezes. She has no rales. She exhibits no tenderness.  Prolonged expiratory phase  Abdominal: She exhibits no distension and no mass. There is no tenderness. There is no rebound and no guarding.  Musculoskeletal: She exhibits no edema and no tenderness.  Lymphadenopathy:    She has no cervical adenopathy.  Neurological: She is alert and oriented to person, place, and time. She has normal reflexes. She exhibits normal muscle tone. Coordination normal.  Skin: Skin is warm and dry. She is not diaphoretic. No erythema. No pallor.  Psychiatric: She has a normal mood and affect. Her behavior is normal. Judgment and thought content normal.          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   #2 Bronchiectasis: due to #1 with cavity: there is risk of fungal infection prickly for aspergilloma in this cavity to be need to be watched carefully. --Need to continue to have efforts made to maximize expectoration of sputum and "pulmonary toilet"  #3 HTN: defer to PCP and her cardiologist  #4 Etoh use: will check CMP if LFTS transaminases up will have her abstain from etoh and recheck  #5 Confusion: asked her to split her rifampin dose to BID to see if this ameliorates this potential side effect

## 2012-07-25 ENCOUNTER — Ambulatory Visit: Payer: Medicare Other | Admitting: Cardiovascular Disease

## 2012-07-25 ENCOUNTER — Other Ambulatory Visit: Payer: Medicare Other

## 2012-07-25 DIAGNOSIS — A319 Mycobacterial infection, unspecified: Secondary | ICD-10-CM | POA: Diagnosis not present

## 2012-08-02 ENCOUNTER — Encounter: Payer: Self-pay | Admitting: *Deleted

## 2012-08-05 ENCOUNTER — Ambulatory Visit (INDEPENDENT_AMBULATORY_CARE_PROVIDER_SITE_OTHER): Payer: Medicare Other | Admitting: Cardiovascular Disease

## 2012-08-05 ENCOUNTER — Encounter: Payer: Self-pay | Admitting: Cardiovascular Disease

## 2012-08-05 VITALS — BP 170/100 | HR 84 | Resp 20 | Ht 60.0 in | Wt 101.8 lb

## 2012-08-05 DIAGNOSIS — I10 Essential (primary) hypertension: Secondary | ICD-10-CM | POA: Diagnosis not present

## 2012-08-05 MED ORDER — AMLODIPINE BESYLATE 2.5 MG PO TABS: 2.5000 mg | ORAL_TABLET | Freq: Every day | ORAL | Status: DC

## 2012-08-05 NOTE — Progress Notes (Signed)
Patient ID: Mia Hernandez, female   DOB: 1940/10/30, 72 y.o.   MRN: [id number]     Reason for office visit Hypertension  Mia Hernandez is here to followup after having very high blood pressure recorded during recent office visits related to MAC infection. This has been a lingering problem now that is responding very slowly to intense 3 antibiotic regimens for Mycobacterium. She was switched from erythromycin to azithromycin.  She recently saw Dr. Algis Liming and when she saw Huey Bienenstock her blood pressure was severely elevated but at home her blood pressures usually in the 140s over 80s. He should be noted that she has previously demonstrated "white coat" or situational hypertension. When I first evaluated her in June of last year on arrival to the office her blood pressure was 172/80 and after she had relaxed for about 20 minutes her blood pressure was down to 131/82 mm Hg.  Recent laboratory tests including renal function studies have been normal. She leads a very healthy lifestyle with as much exercise as she can and does not eat a high sodium diet. She has a history of questionable transient ischemic attack.    Allergies  Allergen Reactions  . Latex Rash  . Aspirin   . Betadine (Povidone Iodine)   . Codeine   . Iodinated Diagnostic Agents   . Morphine And Related   . Penicillins   . Prednisone     "out of mind" state  . Sulfa Antibiotics     Current Outpatient Prescriptions  Medication Sig Dispense Refill  . azithromycin (ZITHROMAX) 500 MG tablet Take 1 tablet (500 mg total) by mouth daily.  90 tablet  3  . ethambutol (MYAMBUTOL) 100 MG tablet Take 3 tablets (300 mg total) by mouth daily.  270 tablet  3  . ethambutol (MYAMBUTOL) 400 MG tablet Take 1 tablet (400 mg total) by mouth daily. Take 1 400mg  tablet with 3 100mg  tablets for 700mg  daily  90 tablet  3  . hydrALAZINE (APRESOLINE) 25 MG tablet Take 25 mg by mouth as needed (If blood pressure gets over 150/90).      . rifampin  (RIFADIN) 300 MG capsule Take 2 capsules (600 mg total) by mouth daily.  180 capsule  3  . amLODipine (NORVASC) 2.5 MG tablet Take 1 tablet (2.5 mg total) by mouth daily.  90 tablet  3  . amLODipine (NORVASC) 2.5 MG tablet Take 1 tablet (2.5 mg total) by mouth daily.  30 tablet  3   No current facility-administered medications for this visit.    Past Medical History  Diagnosis Date  . Osteoporosis   . ALLERGIC RHINITIS   . Chronic back pain   . Mycobacterium avium-intracellulare infection   . Bronchiectasis     Past Surgical History  Procedure Laterality Date  . Cholecystectomy    . Vaginal hysterectomy    . Laminectomy    . Breast biopsy      Family History  Problem Relation Age of Onset  . Breast cancer Mother   . Stroke Father   . Pulmonary embolism Brother     History   Social History  . Marital Status: Married    Spouse Name: N/A    Number of Children: N/A  . Years of Education: N/A   Occupational History  . retired    Social History Main Topics  . Smoking status: Never Smoker   . Smokeless tobacco: Not on file  . Alcohol Use: 0.6 oz/week    1 Glasses of  wine per week     Comment: at dinner  . Drug Use: No  . Sexually Active: Not on file   Other Topics Concern  . Not on file   Social History Narrative  . No narrative on file    Review of systems: She has a chronic cough and mild shortness of breath on exertion. She denies chest pain at rest with exertion she denies orthopnea present nocturnal dyspnea emotionally edema. Has not had new focal neurological deficits and denies intermittent claudication. She denies abdominal or flank pain. Has not had dysphagia nausea vomiting diarrhea or constipation. She denies hematuria polyuria or polydipsia frequency urgency. She has not had major weight changes denies fever or chills recently. She denies any mood problems. She is a rather anxious and intense person.  PHYSICAL EXAM BP 170/100  Pulse 84  Resp 20   Ht 5' (1.524 m)  Wt 101 lb 12.8 oz (46.176 kg)  BMI 19.88 kg/m2  General: Alert, oriented x3, no distress Head: no evidence of trauma, PERRL, EOMI, no exophtalmos or lid lag, no myxedema, no xanthelasma; normal ears, nose and oropharynx Neck: normal jugular venous pulsations and no hepatojugular reflux; brisk carotid pulses without delay and no carotid bruits Chest: clear to auscultation, no signs of consolidation by percussion or palpation, normal fremitus, symmetrical and full respiratory excursions Cardiovascular: normal position and quality of the apical impulse, regular rhythm, normal first and second heart sounds, no murmurs, rubs or gallops Abdomen: no tenderness or distention, no masses by palpation, no abnormal pulsatility or arterial bruits, normal bowel sounds, no hepatosplenomegaly Extremities: no clubbing, cyanosis or edema; 2+ radial, ulnar and brachial pulses bilaterally; 2+ right femoral, posterior tibial and dorsalis pedis pulses; 2+ left femoral, posterior tibial and dorsalis pedis pulses; no subclavian or femoral bruits Neurological: grossly nonfocal   EKG: Sinus rhythm minor intraventricular conduction delay possible left atrial enlargement  BMET    Component Value Date/Time   NA 133* 07/23/2012 0953   K 4.4 07/23/2012 0953   CL 95* 07/23/2012 0953   CO2 29 07/23/2012 0953   GLUCOSE 90 07/23/2012 0953   BUN 9 07/23/2012 0953   CREATININE 0.57 07/23/2012 0953   CREATININE 0.6 03/20/2012 1028   CALCIUM 9.8 07/23/2012 0953     ASSESSMENT AND PLAN Hypertension Her blood pressure has been very high during recent interactions with medical professionals. When she checks that blood pressure at home it is still high but not nearly as elevated as it is today. Her blood pressure monitor is accurate. We tested repeatedly today cancer office sphygmomanometer. At home her monitor shows average blood pressure in the range of 145-150/80-88 mm Hg. She seems to have fairly mild systemic  hypertension but clearly has a severe situational elevation. I think this is related to the frustration and anxiety caused by her chronic infection. I given a prescription for amlodipine 2.5 mg once daily. She will check her blood pressure again with her home monitor. We discussed the potential usually mild side effects of this medication. She is clearly very wary of taking any blood pressure medicines. I think she will do better with amlodipine than with a short acting drug such as hydralazine. Also think amlodipine is unlikely to interact with her current antibiotics the   Meds ordered this encounter  Medications  .       Marland Kitchen amLODipine (NORVASC) 2.5 MG tablet    Sig: Take 1 tablet (2.5 mg total) by mouth daily.    Dispense:  90  tablet    Refill:  3  . amLODipine (NORVASC) 2.5 MG tablet    Sig: Take 1 tablet (2.5 mg total) by mouth daily.    Dispense:  30 tablet    Refill:  3    Kashtyn Jankowski  Thurmon Fair, MD, [facility] [phone] office [phone] pager

## 2012-08-05 NOTE — Patient Instructions (Addendum)
Start Amlodipine 2.5mg  daily.  [facility].  Your physician recommends that you schedule a follow-up appointment in: One year.

## 2012-08-05 NOTE — Assessment & Plan Note (Signed)
Her blood pressure has been very high during recent interactions with medical professionals. When she checks that blood pressure at home it is still high but not nearly as elevated as it is today. Her blood pressure monitor is accurate. We tested repeatedly today cancer office sphygmomanometer. At home her monitor shows average blood pressure in the range of 145-150/80-88 mm Hg. She seems to have fairly mild systemic hypertension but clearly has a severe situational elevation. I think this is related to the frustration and anxiety caused by her chronic infection. I given a prescription for amlodipine 2.5 mg once daily. She will check her blood pressure again with her home monitor. We discussed the potential usually mild side effects of this medication. She is clearly very wary of taking any blood pressure medicines. I think she will do better with amlodipine than with a short acting drug such as hydralazine. Also think amlodipine is unlikely to interact with her current antibiotics the

## 2012-08-13 ENCOUNTER — Other Ambulatory Visit: Payer: Self-pay

## 2012-08-22 ENCOUNTER — Ambulatory Visit: Payer: Medicare Other | Admitting: Internal Medicine

## 2012-08-25 DIAGNOSIS — L821 Other seborrheic keratosis: Secondary | ICD-10-CM | POA: Diagnosis not present

## 2012-08-25 DIAGNOSIS — D1801 Hemangioma of skin and subcutaneous tissue: Secondary | ICD-10-CM | POA: Diagnosis not present

## 2012-08-25 DIAGNOSIS — Z85828 Personal history of other malignant neoplasm of skin: Secondary | ICD-10-CM | POA: Diagnosis not present

## 2012-08-27 DIAGNOSIS — H5000 Unspecified esotropia: Secondary | ICD-10-CM | POA: Diagnosis not present

## 2012-08-27 DIAGNOSIS — H04129 Dry eye syndrome of unspecified lacrimal gland: Secondary | ICD-10-CM | POA: Diagnosis not present

## 2012-08-27 DIAGNOSIS — H259 Unspecified age-related cataract: Secondary | ICD-10-CM | POA: Diagnosis not present

## 2012-08-27 DIAGNOSIS — H16109 Unspecified superficial keratitis, unspecified eye: Secondary | ICD-10-CM | POA: Diagnosis not present

## 2012-09-06 LAB — AFB CULTURE WITH SMEAR (NOT AT ARMC): Acid Fast Smear: NONE SEEN

## 2012-09-22 ENCOUNTER — Telehealth: Payer: Self-pay

## 2012-09-22 DIAGNOSIS — A319 Mycobacterial infection, unspecified: Secondary | ICD-10-CM

## 2012-09-22 MED ORDER — ETHAMBUTOL HCL 400 MG PO TABS: 400.0000 mg | ORAL_TABLET | Freq: Every day | ORAL | Status: DC

## 2012-09-22 MED ORDER — ETHAMBUTOL HCL 100 MG PO TABS: 300.0000 mg | ORAL_TABLET | Freq: Every day | ORAL | Status: DC

## 2012-09-22 NOTE — Telephone Encounter (Signed)
Patient uses a mail order pharmacy and is requesting a 90 day supply of ethambutol 400mg  tablets daily and ethambutol 100mg  tablets (3 daily). Script sent to pharmacy  Laurell Josephs, RN

## 2012-10-03 DIAGNOSIS — H903 Sensorineural hearing loss, bilateral: Secondary | ICD-10-CM | POA: Diagnosis not present

## 2012-11-13 ENCOUNTER — Other Ambulatory Visit: Payer: Self-pay

## 2012-11-24 ENCOUNTER — Encounter: Payer: Self-pay | Admitting: Internal Medicine

## 2012-11-24 ENCOUNTER — Ambulatory Visit (INDEPENDENT_AMBULATORY_CARE_PROVIDER_SITE_OTHER): Payer: Medicare Other | Admitting: Internal Medicine

## 2012-11-24 VITALS — BP 134/80 | HR 85 | Ht 60.2 in | Wt 103.2 lb

## 2012-11-24 DIAGNOSIS — A318 Other mycobacterial infections: Secondary | ICD-10-CM

## 2012-11-24 DIAGNOSIS — J479 Bronchiectasis, uncomplicated: Secondary | ICD-10-CM

## 2012-11-24 DIAGNOSIS — A319 Mycobacterial infection, unspecified: Secondary | ICD-10-CM

## 2012-11-24 DIAGNOSIS — A310 Pulmonary mycobacterial infection: Secondary | ICD-10-CM

## 2012-11-24 NOTE — Progress Notes (Signed)
04/18/11- 72 yo F never smoker referred by Dr Reynaldo Minium because of cough. Persistent cough since January. In late February she was treated with 7 days of Levaquin after chest x-ray showed some density. Cough is better in warm moist air, worse when out in pollen or when active and better at night. Cough improved with Levaquin but persists. Notices postnasal drip. Saw ENT/Dr.Wolicki who didn't think there was enough of sinus problem to be of concern. Cough is dry. She denies enlarged glands, fever, sweat or weight loss. Denies history of lung or nasal condition. She has been told that she has some wheeze in the right lung, going back years. She insists she has always been healthy and athletic, never aware of shortness of breath or breathing problems. Denies GERD. Remote history of walking pneumonia. As a small child mother had tuberculosis. No PPD in many years but not remembered  as positive. Chest x-ray 02/26/2011-right-sided infiltrate recommend followup. Chest x-ray 03/07/2011-COPD, prominent linear markings in the right middle lobe may be due to atelectasis or scarring but pneumonia can't be excluded.  05/11/11-  4 yoF never smoker followed for cough     PCP Dr Reynaldo Minium      Husband here Denies seasonal rhinitis complaints. Still some dry cough with a sense there is mucus in her throat. Dr Erik Obey ENT gave Z-Pak then Cleocin now for 30 days. She denies shortness [place] chest 04/18/11- reviewed w/ them-- IMPRESSION:  Diffuse peribronchovascular nodularity, right greater than left,  with areas of bronchiectasis and mild cavitation. 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 Follow-up of the  dominant cavitary lesion in the right lower lobe is recommended in  4-6 weeks to ensure stability, as a developing abscess cannot be  excluded. Similarly, although felt to be less likely, malignancy  cannot be definitively excluded and continued consideration on   follow-up exams is warranted.  Original Report Authenticated By: Luretha Rued, M.D.  PFT 05/10/11- mild obstructive airways disease, small airways, insignificant response to bronchodilator. air trapping, hyperinflation, normal diffusion FEV1 1.81/111%, FEV1/FVC 0.71, FEF 25-75% 1.19/59%. TLC 134%, RV 174%, DLCO 115%.  07/02/11- 70 yoF never smoker followed for cough, +MAIC/ triple therapy    PCP Dr Reynaldo Minium      Husband here Slow 1 little streak of blood in mucus as a single event. Mostly thin clear mucus now. Denies fever, night sweats, nodes. Of her 3 drugs, Biaxin bothers her the most and she thinks it is affecting her hearing. She wears hearing aids already, fitted by the audiologist at [facility] Dr. Kathrin Penner has already checked her eyes on ethambutol. Sputum Cx - 06/07/11- + M.avium CXR 06/12/11- reviewed IMPRESSION:  Stable radiographic appearance of the chest since 03/07/2011.  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 Continued follow-up of the right lower lobe cavitary  lesion is recommended.  Original Report Authenticated By: Randall An, M.D.   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 Feeling well. Not coughing at all-cough was her original complaint. Tolerating medications well as we reduced Biaxin from 1500 mg 3 days per week. That was because of concern about her hearing.  She suspects now there was no connection. She denies fever, sweat, chills. Scant sputum is clear. CXR 08/31/11- reviewed with her IMPRESSION:  1. Persistent cavitary lesion in the right lower lobe. Favor  Mycobacterium avium intracellular. Recommend ongoing radiographic  follow-up.  2. Similar appearance of interstitial thickening and right middle  lobe bronchiectasis.  Original Report Authenticated By: Areta Haber, M.D.   12/21/11- 18 yoF never smoker followed for cough, +MAIC/ triple therapy    PCP Dr Reynaldo Minium  FOLLOWS FOR: recently in hospital-[place] 12/05/11 for hemoptysis; denies any wheezing, SOB, cough, or congestion CT 12/05/11- [place]- RLL cavitary lesion, sub-centimeter satellite foci in RML, RLL, with COPD levaquin was added to her triple therapy ( which began 06/18/11). No prior or subsequent heme.  CXR 08/31/11   IMPRESSION:  1. Persistent cavitary lesion in the right lower lobe. Favor  Mycobacterium avium intracellular. Recommend ongoing radiographic  follow-up.  2. Similar appearance of interstitial thickening and right middle  lobe bronchiectasis.  Original Report Authenticated By: Areta Haber, M.D.   03/20/12- 81 yoF never smoker followed for cough, +MAIC/ triple therapy began 06/18/11    PCP Dr Reynaldo Minium   Husband here  FOLLOWS FOR: feels like she can breathe deeper than before, Had coughing (productive-in mornings;yellow/green to white in color) since last bleeding episode she had(3 times); also head cold recently.Continues triple therapy for [facility]. Third and latest hemoptysis was only slight- in January. Took extra biaxin then. Most days morning cough, scant, clear mucus. Just got over a cold 1 week ago.   05/22/12- 71 yoF never smoker followed for cough, +MAIC/ triple therapy began 06/18/11    PCP Dr Reynaldo Minium   Husband here FOLLOWS FOR: Morning times-has cough spell and gets little aount of phlegm-lite in color. Sputum Cx AFB again POS [facility]  03/21/12 She has felt somewhat better in the last few weeks with more energy but still productive cough with scant yellow sputum. No sweats or fevers, adenopathy, or recent hemoptysis CXR 04/28/12 IMPRESSION:  1. No significant change in cavitary lung lesion within the right  lower lobe.  2. Chronic lung abnormality is stable compared with previous exam.  Original Report Authenticated By: Kerby Moors, M.D.  11/24/12- 76 yoF never smoker followed for cough, +MAIC/ triple therapy began 06/18/11    PCP Dr Reynaldo Minium   Husband here Bronchiectasis. Pt reports breathing has unchanged. for about 1 month she is coughing up more yellow/liquid phlegm since on Rafampin She declines flu vaccine and pneumonia vaccine, reporting large local reaction in the past. Discussed. Denies fever or night sweats. Increased cough productive yellow sputum. No blood. Sputum culture for AFB negative in 6 weeks, obtain 07/25/2012 Has been seeing Infectious Disease:  --azithromycin 500 mg daily  --Ethambutol 700 mg daily  --Rifampin 600 mg daily CT 05/22/12 IMPRESSION:  1. Diffuse peribronchovascular nodularity, right greater than  left, with areas of bronchiectasis and cavitation. 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  2. The dominant cavitary lesion in the right lower lobe has  increased in size from previous exam.  Original Report Authenticated By: Signa Kell, M.D.  ROS-see HPI Constitutional:   No-   weight loss, night sweats, fevers, chills, fatigue, lassitude. HEENT:   No-  headaches, difficulty swallowing, tooth/dental problems, sore throat,       No-  sneezing, itching, ear ache, nasal congestion, post nasal drip,  CV:  No-   chest pain, orthopnea, PND, swelling in lower extremities, anasarca, dizziness, palpitations Resp: No-   shortness of breath with exertion or at rest.              +  productive cough,  little non-productive cough,  +No more coughing up of blood.              No-    change in color of mucus.  No- wheezing.   Skin: No-   rash or lesions. GI:  No-   heartburn, indigestion, abdominal pain, nausea, vomiting,  GU:  MS:  No-   joint pain or swelling.   Neuro-     nothing unusual Psych:  No- change in mood or affect. No depression or anxiety.  No memory loss.  OBJ- Physical Exam  General- Alert, Oriented, Affect-appropriate, Distress- none acute, slender, talkative Skin- rash-none, lesions- none, excoriation- none Lymphadenopathy- none Head- atraumatic            Eyes- Gross vision intact, PERRLA, conjunctivae and secretions clear            Ears- Hearing aides            Nose- Clear, no-Septal dev, mucus, polyps, erosion, perforation             Throat- Mallampati II , mucosa clear , drainage- none, tonsils- atrophic Neck- flexible , trachea midline, no stridor , thyroid nl, carotid no bruit Chest - symmetrical excursion , unlabored           Heart/CV- RRR , no murmur , no gallop  , no rub, nl s1 s2                           - JVD- none , edema- none, stasis changes- none, varices- none           Lung- +few squeaks, wheeze-none, cough- none , dullness-none, rub- none           Chest wall-  Abd-  Br/ Gen/ Rectal- Not done, not indicated Extrem- cyanosis- none, clubbing, none, atrophy- none, strength- nl Neuro- grossly intact to observation

## 2012-11-24 NOTE — Patient Instructions (Signed)
Order- CXR- bronchiectasis, mycobacterium avium  Order- Sputum C&S - routine, fungal, AFB   On  Azithromycin, ethambutol, rifampin

## 2012-11-26 ENCOUNTER — Telehealth: Payer: Self-pay

## 2012-11-26 ENCOUNTER — Ambulatory Visit (INDEPENDENT_AMBULATORY_CARE_PROVIDER_SITE_OTHER)
Admission: RE | Admit: 2012-11-26 | Discharge: 2012-11-26 | Disposition: A | Discharge status: Home / Self Care | Payer: Medicare Other | Source: Ambulatory Visit | Attending: Internal Medicine | Admitting: Internal Medicine

## 2012-11-26 ENCOUNTER — Other Ambulatory Visit: Payer: Medicare Other

## 2012-11-26 DIAGNOSIS — J479 Bronchiectasis, uncomplicated: Secondary | ICD-10-CM | POA: Diagnosis not present

## 2012-11-26 DIAGNOSIS — A318 Other mycobacterial infections: Secondary | ICD-10-CM

## 2012-11-26 DIAGNOSIS — A310 Pulmonary mycobacterial infection: Secondary | ICD-10-CM

## 2012-11-26 NOTE — Telephone Encounter (Signed)
Message copied by Velvet Bathe on Wed Nov 26, 2012  4:41 PM ------      Message from: [place], Joni Fears D      Created: Wed Nov 26, 2012  4:40 PM       CXR- stable scarring including the known cavity, with no evidence of activity or progresion ------

## 2012-11-26 NOTE — Telephone Encounter (Signed)
Pt aware of stable cxr results, will continue with office recommendations made at lv.  No further action needed at this time. Caulfield,Ashley L

## 2012-11-29 LAB — RESPIRATORY CULTURE OR RESPIRATORY AND SPUTUM CULTURE

## 2012-12-03 ENCOUNTER — Ambulatory Visit: Payer: Medicare Other | Admitting: Infectious Disease

## 2012-12-08 ENCOUNTER — Encounter: Payer: Self-pay | Admitting: Infectious Disease

## 2012-12-08 ENCOUNTER — Ambulatory Visit (INDEPENDENT_AMBULATORY_CARE_PROVIDER_SITE_OTHER): Payer: Medicare Other | Admitting: Infectious Disease

## 2012-12-08 VITALS — BP 179/110 | HR 77 | Temp 97.7°F | Wt 101.0 lb

## 2012-12-08 DIAGNOSIS — J479 Bronchiectasis, uncomplicated: Secondary | ICD-10-CM

## 2012-12-08 DIAGNOSIS — A310 Pulmonary mycobacterial infection: Secondary | ICD-10-CM | POA: Diagnosis not present

## 2012-12-08 DIAGNOSIS — A319 Mycobacterial infection, unspecified: Secondary | ICD-10-CM

## 2012-12-08 DIAGNOSIS — Z23 Encounter for immunization: Secondary | ICD-10-CM

## 2012-12-08 LAB — CBC WITH DIFFERENTIAL/PLATELET
Basophils Absolute: 0.1 10*3/uL (ref 0.0–0.1)
Eosinophils Absolute: 0.4 10*3/uL (ref 0.0–0.7)
Hemoglobin: 14.5 g/dL (ref 12.0–15.0)
Lymphs Abs: 2.1 10*3/uL (ref 0.7–4.0)
MCH: 32.4 pg (ref 26.0–34.0)
Monocytes Relative: 11 % (ref 3–12)
Neutro Abs: 2.7 10*3/uL (ref 1.7–7.7)
Neutrophils Relative %: 46 % (ref 43–77)
Platelets: 265 10*3/uL (ref 150–400)
RBC: 4.47 MIL/uL (ref 3.87–5.11)
RDW: 13.2 % (ref 11.5–15.5)

## 2012-12-08 LAB — COMPLETE METABOLIC PANEL WITH GFR
ALT: 34 U/L (ref 0–35)
Albumin: 4.6 g/dL (ref 3.5–5.2)
CO2: 28 mEq/L (ref 19–32)
Creat: 0.54 mg/dL (ref 0.50–1.10)
GFR, Est African American: >89 mL/min
Potassium: 4 mEq/L (ref 3.5–5.3)
Total Bilirubin: 0.6 mg/dL (ref 0.3–1.2)
Total Protein: 7.2 g/dL (ref 6.0–8.3)

## 2012-12-08 NOTE — Assessment & Plan Note (Signed)
Sputum culture negative for AFB 07/25/2012. Continues therapy managed by ID.

## 2012-12-08 NOTE — Progress Notes (Signed)
Subjective:    Patient ID: Mia Hernandez, female    DOB: 19-Nov-1940, 72 y.o.   MRN: [id number]  HPI  72 year old lady with past medical history significant for diagnosis of Mycobacterium avium infection of the lungs last May when it was isolated on culture. She started on therapy with clarithromycin rifampin and ethambutol 3 days per week. She had great difficulty tolerating the Biaxin which was she was taking it twice daily doses on the day she was taking it was reduced to once daily dosing on July 01 2012 Apparently at that time there was concern about toxicity although she states that now that she was found to need simply new hearing aids. Initially while on therapy for Mycobacterium avium showed mprovement in her symptoms of chronic daily cough and sputum production. However this spring she is having worsening cough with copious sputum production.  This fall in [place] apparently she had episodes of hemoptysis and was hospitalized due to Hospital in [place] where she was placed in isolation for rule out tuberculosis. Ultimately was realized that she was suffering from Mycobacterium avium infection alone.   In fact she notices is worsening of productive phlegm on days where she is taking her drugs. She had repeat sputum taken this spring and it is again grow Mycobacterium avium. The organism has not yet been sent for susceptibility testing and showed macrolide sensitivity and in vitro synergy with rifampin and ETH in inhibiting growth in lab.  She has had a recent CT scan of the lungs which has shown an increase in the cavity in the right lower  lung. Her diffuse nodular disease apparently is stable.  I  Changed her to  daily therapy with macrolide (Azithromycin) ethambutol and rifampin. Since change she had been initiallycoughing less, she was gaining weight and was  absent fevers.   She saw Dr. Maple Hudson in November when she reported 1 month she is coughing up more yellow/liquid phlem since on  Rafampin. Chest x-ray was done and showed no changes. Sputum was sent for routine culture which only isolated normal oral pharyngeal flora. AFB culture is without AFB seen on smear blood cultures are still incubating.  Since then her cough has improved although at times he still has some blood streaks in it.    Review of Systems  Constitutional: Negative for fever, chills, diaphoresis, activity change, appetite change, fatigue and unexpected weight change.  HENT: Negative for congestion, rhinorrhea, sinus pressure, sneezing, sore throat and trouble swallowing.   Eyes: Negative for photophobia and visual disturbance.  Respiratory: Positive for cough. Negative for chest tightness, shortness of breath, wheezing and stridor.   Cardiovascular: Negative for chest pain, palpitations and leg swelling.  Gastrointestinal: Negative for nausea, vomiting, abdominal pain, diarrhea, constipation, blood in stool, abdominal distention and anal bleeding.  Genitourinary: Negative for dysuria, hematuria, flank pain and difficulty urinating.  Musculoskeletal: Negative for arthralgias, back pain, gait problem, joint swelling and myalgias.  Skin: Negative for color change, pallor, rash and wound.  Neurological: Negative for dizziness, tremors, weakness and light-headedness.  Hematological: Negative for adenopathy. Does not bruise/bleed easily.  Psychiatric/Behavioral: Positive for confusion. Negative for behavioral problems, sleep disturbance, dysphoric mood, decreased concentration and agitation.       Objective:   Physical Exam  Constitutional: She is oriented to person, place, and time. No distress.  HENT:  Head: Normocephalic and atraumatic.  Mouth/Throat: Oropharynx is clear and moist. No oropharyngeal exudate.  Eyes: Conjunctivae and EOM are normal. Pupils are equal, round, and reactive  to light. No scleral icterus.  Neck: Normal range of motion. Neck supple. No JVD present.  Cardiovascular: Normal  rate, regular rhythm and normal heart sounds.  Exam reveals no gallop and no friction rub.   No murmur heard. Pulmonary/Chest: No respiratory distress. She has wheezes. She has no rales. She exhibits no tenderness.  Prolonged expiratory phase  Abdominal: She exhibits no distension and no mass. There is no tenderness. There is no rebound and no guarding.  Musculoskeletal: She exhibits no edema and no tenderness.  Lymphadenopathy:    She has no cervical adenopathy.  Neurological: She is alert and oriented to person, place, and time. She has normal reflexes. She exhibits normal muscle tone. Coordination normal.  Skin: Skin is warm and dry. She is not diaphoretic. No erythema. No pallor.  Psychiatric: She has a normal mood and affect. Her behavior is normal. Judgment and thought content normal.          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  I have again emphasized to her that this disease is very difficult to treat and almost impossible to sustain permanent remission of her disease and that she may require protracted perhaps lifelong or intermittent prolonged therapies with the drugs that she currently is receiving.  I spent greater than 45 minutes with the patient including greater than 50% of time in face to face counsel of the patient and in coordination of their care.   #2 Bronchiectasis: due to #1 with cavity: there is risk of fungal infection prickly for aspergilloma in this cavity to be need to be watched carefully. --Need to continue to have efforts made to maximize expectoration of sputum and "pulmonary toilet"  #3 need for influenza vaccination after a lengthy conversation was  finally able to convince her to undergo vaccination with the flu vaccine. She has not had pneumonia vaccination which he also clearly needs with her abnormal lung architecture

## 2012-12-08 NOTE — Assessment & Plan Note (Signed)
Continued productive cough but otherwise clinically stable. Plan-chest x-ray, repeat sputum culture

## 2012-12-15 ENCOUNTER — Encounter: Payer: Self-pay | Admitting: Infectious Disease

## 2012-12-22 LAB — FUNGUS CULTURE W SMEAR: Smear Result: NONE SEEN

## 2013-01-15 LAB — AFB CULTURE WITH SMEAR (NOT AT ARMC): Acid Fast Smear: NONE SEEN

## 2013-01-17 ENCOUNTER — Encounter: Payer: Self-pay | Admitting: Infectious Disease

## 2013-01-22 ENCOUNTER — Other Ambulatory Visit: Payer: Self-pay | Admitting: Internal Medicine

## 2013-01-29 ENCOUNTER — Ambulatory Visit (INDEPENDENT_AMBULATORY_CARE_PROVIDER_SITE_OTHER): Payer: Medicare Other | Admitting: Infectious Disease

## 2013-01-29 ENCOUNTER — Encounter: Payer: Self-pay | Admitting: Infectious Disease

## 2013-01-29 VITALS — BP 162/110 | HR 83 | Temp 97.6°F | Wt 101.0 lb

## 2013-01-29 DIAGNOSIS — R7989 Other specified abnormal findings of blood chemistry: Secondary | ICD-10-CM

## 2013-01-29 DIAGNOSIS — Z7289 Other problems related to lifestyle: Secondary | ICD-10-CM

## 2013-01-29 DIAGNOSIS — F1010 Alcohol abuse, uncomplicated: Secondary | ICD-10-CM

## 2013-01-29 DIAGNOSIS — A310 Pulmonary mycobacterial infection: Secondary | ICD-10-CM

## 2013-01-29 DIAGNOSIS — F1090 Alcohol use, unspecified, uncomplicated: Secondary | ICD-10-CM

## 2013-01-29 DIAGNOSIS — R945 Abnormal results of liver function studies: Secondary | ICD-10-CM

## 2013-01-29 DIAGNOSIS — J479 Bronchiectasis, uncomplicated: Secondary | ICD-10-CM

## 2013-01-29 NOTE — Progress Notes (Signed)
Subjective:    Patient ID: Mia Hernandez, female    DOB: Jun 28, 1940, 73 y.o.   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 She started on therapy with clarithromycin rifampin and ethambutol 3 days per week. She had great difficulty tolerating the Biaxin which was she was taking it twice daily doses on the day she was taking it was reduced to once daily dosing on July 01 2012 Apparently at that time there was concern about toxicity although she states that now that she was found to need simply new hearing aids. Initially while on therapy for Mycobacterium avium showed mprovement in her symptoms of chronic daily cough and sputum production. However this spring she is having worsening cough with copious sputum production.  This fall in [place] apparently she had episodes of hemoptysis and was hospitalized due to Hospital in [place] where she was placed in isolation for rule out tuberculosis. Ultimately was realized that she was suffering from Mycobacterium avium infection alone.   In fact she notices is worsening of productive phlegm on days where she is taking her drugs. She had repeat sputum taken this spring and it is again grow Mycobacterium avium. The organism has not yet been sent for susceptibility testing and showed macrolide sensitivity and in vitro synergy with rifampin and ETH in inhibiting growth in lab.  She has had a recent CT scan of the lungs which has shown an increase in the cavity in the right lower  lung. Her diffuse nodular disease apparently is stable.  I  Changed her to  daily therapy with macrolide (Azithromycin) ethambutol and rifampin. Since change she had been initiallycoughing less, she was gaining weight and was  absent fevers.   She saw Dr. Annamaria Boots in November when she reported 1 month she is coughing up more yellow/liquid phlem since on  Rafampin. Chest x-ray was done and showed no changes. Sputum was sent for routine culture which only isolated normal oral pharyngeal flora. AFB culture is without AFB seen on smear blood cultures in November DID grow M avium again  She has less blood in sputum but still produces vigorous amountsin the afternoon esp when she takes her rifampin  She is NOT losing weight  She states that she feels improved since on the new regimen but with more nausea.  She was anxious re her AST being at 43. She is drinking nightly glass of wine which she can cut down on or remove from her nightly habit. I am not overly concerned by this number      Review of Systems  Constitutional: Negative for diaphoresis, activity change, appetite change, fatigue and unexpected weight change.  HENT: Negative for congestion, sinus pressure, sneezing and trouble swallowing.   Eyes: Negative for photophobia and visual disturbance.  Respiratory: Positive for cough. Negative for chest tightness, shortness of breath and stridor.   Cardiovascular: Negative for palpitations and leg swelling.  Gastrointestinal: Positive for nausea. Negative for vomiting, abdominal pain, diarrhea, constipation, blood in stool, abdominal distention and anal bleeding.  Genitourinary: Negative for dysuria, hematuria, flank pain and difficulty urinating.  Musculoskeletal: Negative for arthralgias, back pain, gait problem and joint swelling.  Skin: Negative for color change, pallor and wound.  Neurological: Negative for dizziness, tremors, weakness and light-headedness.  Hematological: Negative for adenopathy. Does not bruise/bleed easily.  Psychiatric/Behavioral: Negative for behavioral problems, sleep disturbance, dysphoric mood, decreased concentration  and agitation.       Objective:   Physical Exam  Constitutional: She is oriented to person, place, and time. No distress.  HENT:  Head: Normocephalic and atraumatic.  Mouth/Throat: Oropharynx  is clear and moist. No oropharyngeal exudate.  Eyes: Conjunctivae and EOM are normal. No scleral icterus.  Neck: Normal range of motion. Neck supple. No JVD present.  Cardiovascular: Normal rate, regular rhythm and normal heart sounds.  Exam reveals no gallop and no friction rub.   No murmur heard. Pulmonary/Chest: No respiratory distress. She has wheezes. She has no rales. She exhibits no tenderness.  Prolonged expiratory phase  Abdominal: She exhibits no distension and no mass. There is no tenderness. There is no rebound and no guarding.  Musculoskeletal: She exhibits no edema and no tenderness.  Lymphadenopathy:    She has no cervical adenopathy.  Neurological: She is alert and oriented to person, place, and time. She exhibits normal muscle tone. Coordination normal.  Skin: Skin is warm and dry. She is not diaphoretic. No erythema. No pallor.  Psychiatric: She has a normal mood and affect. Her behavior is normal. Judgment and thought content normal.          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  I spent greater than 45 minutes with the patient including greater than 50% of time in face to face counsel of the patient and in coordination of their care.   #2 Bronchiectasis: due to #1 with cavity: there is risk of fungal infection prickly for aspergilloma in this cavity to be need to be  watched carefully. --Need to continue to have efforts made to maximize expectoration of sputum and "pulmonary toilet"  #3 need for influenza vaccination : up to date on yearly flu vaccine  #4 LFT up: cut down on ETOH but LFTS really not that bad at all

## 2013-02-02 ENCOUNTER — Telehealth: Payer: Self-pay | Admitting: *Deleted

## 2013-02-02 NOTE — Telephone Encounter (Signed)
Patient sent the following update about her care: _____________________________________  Questionnaire completed. FYI, am seeing Dr. Sallyanne Kuster for elevated bp detected in your office. Also seeing Dr. Annamaria Boots next week for breathing exercises.

## 2013-02-03 ENCOUNTER — Ambulatory Visit (INDEPENDENT_AMBULATORY_CARE_PROVIDER_SITE_OTHER): Payer: Medicare Other | Admitting: Physician Assistant

## 2013-02-03 ENCOUNTER — Encounter: Payer: Self-pay | Admitting: Physician Assistant

## 2013-02-03 ENCOUNTER — Encounter: Payer: Self-pay | Admitting: Cardiovascular Disease

## 2013-02-03 VITALS — BP 190/110 | HR 73 | Ht 60.0 in | Wt 101.0 lb

## 2013-02-03 DIAGNOSIS — I10 Essential (primary) hypertension: Secondary | ICD-10-CM

## 2013-02-03 MED ORDER — AMLODIPINE BESYLATE 5 MG PO TABS: 5.0000 mg | ORAL_TABLET | Freq: Every day | ORAL | Status: DC

## 2013-02-03 NOTE — Assessment & Plan Note (Signed)
Increase amlodipine to 5 mg daily.  However, as Dr. Sallyanne Kuster has indicated previously she has situational HTN.  I would like to see her home BP under 130/80 consistently.

## 2013-02-03 NOTE — Patient Instructions (Signed)
Increase Norvasc to 5mg  daily.  Follow up with Dr. Loletha Grayer in July.

## 2013-02-03 NOTE — Progress Notes (Signed)
Date:  02/03/2013   ID:  Manley Mason, DOB 05-28-1940, MRN [id number]  PCP:  Geoffery Lyons, MD  Primary Cardiologist:  Croitoru     History of Present Illness: Mia Hernandez is a 73 y.o. female history of Mycobacterium avium-intracellulare infection, , hypertension back pain, bronchiectasis.  She is being treated by Dr. Tommy Medal.  She presents today with elevated blood pressure and his history of white coat syndrome.  When she last saw Dr.Croitoru he had 2.5 mg of amlodipine.  The patient presents today again with elevated blood pressure which is 190/110 here in the office. Patient brought her home readings for the last several days and they range from 440-102 systolic and 72-53 diastolic. It does appear that her home blood pressure device is about 10 mm mercury higher than ours..  The patient is very active.  The patient currently denies nausea, vomiting, fever, chest pain, shortness of breath, orthopnea, dizziness, PND, cough, congestion, abdominal pain, hematochezia, melena, lower extremity edema, claudication.  Wt Readings from Last 3 Encounters:  02/03/13 101 lb (45.813 kg)  01/29/13 101 lb (45.813 kg)  12/08/12 101 lb (45.813 kg)     Past Medical History  Diagnosis Date  . Osteoporosis   . ALLERGIC RHINITIS   . Chronic back pain   . Mycobacterium avium-intracellulare infection   . Bronchiectasis     Current Outpatient Prescriptions  Medication Sig Dispense Refill  . amLODipine (NORVASC) 5 MG tablet Take 1 tablet (5 mg total) by mouth daily.  90 tablet  3  . azithromycin (ZITHROMAX) 500 MG tablet Take 1 tablet (500 mg total) by mouth daily.  90 tablet  3  . ethambutol (MYAMBUTOL) 400 MG tablet Take 1 tablet (400 mg total) by mouth daily. Take 1 400mg  tablet with 3 100mg  tablets for 700mg  daily  90 tablet  3  . rifampin (RIFADIN) 300 MG capsule Take 2 capsules (600 mg total) by mouth daily.  180 capsule  3   No current facility-administered medications for this visit.     Allergies:    Allergies  Allergen Reactions  . Latex Rash  . Aspirin   . Betadine [Povidone Iodine]   . Codeine   . Iodinated Diagnostic Agents   . Morphine And Related   . Penicillins   . Prednisone     "out of mind" state  . Sulfa Antibiotics     Social History:  The patient  reports that she has never smoked. She does not have any smokeless tobacco history on file. She reports that she drinks about 0.6 ounces of alcohol per week. She reports that she does not use illicit drugs.   Family history:   Family History  Problem Relation Age of Onset  . Breast cancer Mother   . Stroke Father   . Pulmonary embolism Brother     ROS:  Please see the history of present illness.  All other systems reviewed and negative.   PHYSICAL EXAM: VS:  BP 190/110  Pulse 73  Ht 5' (1.524 m)  Wt 101 lb (45.813 kg)  BMI 19.73 kg/m2 Well nourished, well developed, in no acute distress HEENT: Pupils are equal round react to light accommodation extraocular movements are intact.  Neck: no JVDNo cervical lymphadenopathy. Cardiac: Regular rate and rhythm without murmurs rubs or gallops. Lungs:  Bilateral inspiratory wheeze Ext: no lower extremity edema.  2+ radial and dorsalis pedis pulses. Skin: warm and dry Neuro:  Grossly normal     ASSESSMENT AND PLAN:  Problem List Items Addressed This Visit   Hypertension     Increase amlodipine to 5 mg daily.  However, as Dr. Sallyanne Kuster has indicated previously she has situational HTN.  I would like to see her home BP under 130/80 consistently.      Relevant Medications      amLODIpine (NORVASC) tablet    Other Visit Diagnoses   HTN (hypertension)    -  Primary    Relevant Orders       EKG 12-Lead

## 2013-02-16 ENCOUNTER — Other Ambulatory Visit (INDEPENDENT_AMBULATORY_CARE_PROVIDER_SITE_OTHER): Payer: Medicare Other

## 2013-02-16 ENCOUNTER — Encounter: Payer: Self-pay | Admitting: Internal Medicine

## 2013-02-16 ENCOUNTER — Ambulatory Visit (INDEPENDENT_AMBULATORY_CARE_PROVIDER_SITE_OTHER)
Admission: RE | Admit: 2013-02-16 | Discharge: 2013-02-16 | Disposition: A | Discharge status: Home / Self Care | Payer: Medicare Other | Source: Ambulatory Visit | Attending: Internal Medicine | Admitting: Internal Medicine

## 2013-02-16 ENCOUNTER — Ambulatory Visit (INDEPENDENT_AMBULATORY_CARE_PROVIDER_SITE_OTHER): Payer: Medicare Other | Admitting: Internal Medicine

## 2013-02-16 VITALS — BP 120/62 | HR 85 | Ht 60.0 in | Wt 103.2 lb

## 2013-02-16 DIAGNOSIS — J449 Chronic obstructive pulmonary disease, unspecified: Secondary | ICD-10-CM | POA: Diagnosis not present

## 2013-02-16 DIAGNOSIS — Z23 Encounter for immunization: Secondary | ICD-10-CM

## 2013-02-16 DIAGNOSIS — A318 Other mycobacterial infections: Secondary | ICD-10-CM

## 2013-02-16 DIAGNOSIS — J479 Bronchiectasis, uncomplicated: Secondary | ICD-10-CM

## 2013-02-16 DIAGNOSIS — A310 Pulmonary mycobacterial infection: Secondary | ICD-10-CM

## 2013-02-16 DIAGNOSIS — A319 Mycobacterial infection, unspecified: Secondary | ICD-10-CM

## 2013-02-16 LAB — BASIC METABOLIC PANEL
BUN: 14 mg/dL (ref 6–23)
CALCIUM: 9.5 mg/dL (ref 8.4–10.5)
CO2: 27 mEq/L (ref 19–32)
CREATININE: 0.7 mg/dL (ref 0.4–1.2)
Chloride: 100 mEq/L (ref 96–112)
GFR: 93.23 mL/min (ref >60.00)
Glucose, Bld: 100 mg/dL — ABNORMAL HIGH (ref 70–99)
Potassium: 3.9 mEq/L (ref 3.5–5.1)
Sodium: 138 mEq/L (ref 135–145)

## 2013-02-16 LAB — HEPATIC FUNCTION PANEL
ALT: 53 U/L — ABNORMAL HIGH (ref 0–35)
AST: 55 U/L — ABNORMAL HIGH (ref 0–37)
Albumin: 4.3 g/dL (ref 3.5–5.2)
Alkaline Phosphatase: 78 U/L (ref 39–117)
Bilirubin, Direct: 0 mg/dL (ref 0.0–0.3)
Total Bilirubin: 0.5 mg/dL (ref 0.3–1.2)
Total Protein: 7.5 g/dL (ref 6.0–8.3)

## 2013-02-16 MED ORDER — DOXYCYCLINE HYCLATE 100 MG PO TABS: ORAL_TABLET | ORAL | Status: DC

## 2013-02-16 MED ORDER — FLUTTER DEVI
Status: DC
Start: 2013-02-16 — End: 2013-04-24

## 2013-02-16 NOTE — Assessment & Plan Note (Signed)
She is concerned about toleration of medications, which we discussed. Therapy is being guided by Infectious Disease, now on daily therapy Plan-recheck chemistry, watching liver function

## 2013-02-16 NOTE — Assessment & Plan Note (Signed)
More productive cough. No blood but we are watching the status of her right lung cavity. Plan-Flutter device, doxycycline, chest x-ray. If cavity increases in size, we may need CT scan.

## 2013-02-16 NOTE — Progress Notes (Signed)
04/18/11- 73 yo F never smoker referred by Dr Reynaldo Minium because of cough. Persistent cough since January. In late February she was treated with 7 days of Levaquin after chest x-ray showed some density. Cough is better in warm moist air, worse when out in pollen or when active and better at night. Cough improved with Levaquin but persists. Notices postnasal drip. Saw ENT/Dr.Wolicki who didn't think there was enough of sinus problem to be of concern. Cough is dry. She denies enlarged glands, fever, sweat or weight loss. Denies history of lung or nasal condition. She has been told that she has some wheeze in the right lung, going back years. She insists she has always been healthy and athletic, never aware of shortness of breath or breathing problems. Denies GERD. Remote history of walking pneumonia. As a small child mother had tuberculosis. No PPD in many years but not remembered  as positive. Chest x-ray 02/26/2011-right-sided infiltrate recommend followup. Chest x-ray 03/07/2011-COPD, prominent linear markings in the right middle lobe may be due to atelectasis or scarring but pneumonia can't be excluded.  05/11/11-  4 yoF never smoker followed for cough     PCP Dr Reynaldo Minium      Husband here Denies seasonal rhinitis complaints. Still some dry cough with a sense there is mucus in her throat. Dr Erik Obey ENT gave Z-Pak then Cleocin now for 30 days. She denies shortness [place] breath [place] chest 04/18/11- reviewed w/ them-- IMPRESSION:  Diffuse peribronchovascular nodularity, right greater than left,  with areas of bronchiectasis and mild cavitation. 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 Follow-up of the  dominant cavitary lesion in the right lower lobe is recommended in  4-6 weeks to ensure stability, as a developing abscess cannot be  excluded. Similarly, although felt to be less likely, malignancy  cannot be definitively excluded and continued consideration on   follow-up exams is warranted.  Original Report Authenticated By: Luretha Rued, M.D.  PFT 05/10/11- mild obstructive airways disease, small airways, insignificant response to bronchodilator. air trapping, hyperinflation, normal diffusion FEV1 1.81/111%, FEV1/FVC 0.71, FEF 25-75% 1.19/59%. TLC 134%, RV 174%, DLCO 115%.  07/02/11- 70 yoF never smoker followed for cough, +MAIC/ triple therapy    PCP Dr Reynaldo Minium      Husband here Slow 1 little streak of blood in mucus as a single event. Mostly thin clear mucus now. Denies fever, night sweats, nodes. Of her 3 drugs, Biaxin bothers her the most and she thinks it is affecting her hearing. She wears hearing aids already, fitted by the audiologist at [facility] Dr. Kathrin Penner has already checked her eyes on ethambutol. Sputum Cx - 06/07/11- + M.avium CXR 06/12/11- reviewed IMPRESSION:  Stable radiographic appearance of the chest since 03/07/2011.  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 Continued follow-up of the right lower lobe cavitary  lesion is recommended.  Original Report Authenticated By: Randall An, M.D.   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 Feeling well. Not coughing at all-cough was her original complaint. Tolerating medications well as we reduced Biaxin from 1500 mg 3 days per week. That was because of concern about her hearing.  She suspects now there was no connection. She denies fever, sweat, chills. Scant sputum is clear. CXR 08/31/11- reviewed with her IMPRESSION:  1. Persistent cavitary lesion in the right lower lobe. Favor  Mycobacterium avium intracellular. Recommend ongoing radiographic  follow-up.  2. Similar appearance of interstitial thickening and right middle  lobe bronchiectasis.  Original Report Authenticated By: Areta Haber, M.D.   12/21/11- 18 yoF never smoker followed for cough, +MAIC/ triple therapy    PCP Dr Reynaldo Minium  FOLLOWS FOR: recently in hospital-[place] 12/05/11 for hemoptysis; denies any wheezing, SOB, cough, or congestion CT 12/05/11- [place]- RLL cavitary lesion, sub-centimeter satellite foci in RML, RLL, with COPD levaquin was added to her triple therapy ( which began 06/18/11). No prior or subsequent heme.  CXR 08/31/11   IMPRESSION:  1. Persistent cavitary lesion in the right lower lobe. Favor  Mycobacterium avium intracellular. Recommend ongoing radiographic  follow-up.  2. Similar appearance of interstitial thickening and right middle  lobe bronchiectasis.  Original Report Authenticated By: Areta Haber, M.D.   03/20/12- 81 yoF never smoker followed for cough, +MAIC/ triple therapy began 06/18/11    PCP Dr Reynaldo Minium   Husband here  FOLLOWS FOR: feels like she can breathe deeper than before, Had coughing (productive-in mornings;yellow/green to white in color) since last bleeding episode she had(3 times); also head cold recently.Continues triple therapy for [facility]. Third and latest hemoptysis was only slight- in January. Took extra biaxin then. Most days morning cough, scant, clear mucus. Just got over a cold 1 week ago.   05/22/12- 71 yoF never smoker followed for cough, +MAIC/ triple therapy began 06/18/11    PCP Dr Reynaldo Minium   Husband here FOLLOWS FOR: Morning times-has cough spell and gets little aount of phlegm-lite in color. Sputum Cx AFB again POS [facility]  03/21/12 She has felt somewhat better in the last few weeks with more energy but still productive cough with scant yellow sputum. No sweats or fevers, adenopathy, or recent hemoptysis CXR 04/28/12 IMPRESSION:  1. No significant change in cavitary lung lesion within the right  lower lobe.  2. Chronic lung abnormality is stable compared with previous exam.  Original Report Authenticated By: Kerby Moors, M.D.  11/24/12- 76 yoF never smoker followed for cough, +MAIC/ triple therapy began 06/18/11    PCP Dr Reynaldo Minium   Husband here Bronchiectasis. Pt reports breathing has unchanged. for about 1 month she is coughing up more yellow/liquid phlegm since on Rafampin She declines flu vaccine and pneumonia vaccine, reporting large local reaction in the past. Discussed. Denies fever or night sweats. Increased cough productive yellow sputum. No blood. Sputum culture for AFB negative in 6 weeks, obtain 07/25/2012 Has been seeing Infectious Disease:  --azithromycin 500 mg daily  --Ethambutol 700 mg daily  --Rifampin 600 mg daily CT 05/22/12 IMPRESSION:  1. Diffuse peribronchovascular nodularity, right greater than  left, with areas of bronchiectasis and cavitation. 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  2. The dominant cavitary lesion in the right lower lobe has  increased in size from previous exam.  Original Report Authenticated By: Kerby Moors, M.D.  02/16/13- 71 yoF never smoker followed for cough, +MAIC/ triple therapy began 06/18/11    PCP Dr Reynaldo Minium   Husband here FOLLOWS FOR: has been doing well since last visit; husband states she continues to cough alot. AFB cx 11/26/12 again POS MAIC Followed by [facility]/ Dr Wendie Agreste and has been on daily triple therapy since June. We reviewed her x-rays and liver function tests, noting minor elevation of transaminase. She is breathing well, denies fever or sweats" functioning fine". She complains of very productive cough,  especially in the mornings and after sitting. Rare specks of blood. Sputum is usually watery clear with slight yellow. CXR 11/26/12 IMPRESSION:  Stable appearance of cavitary lesion seen in right lower lobe  compared to prior exam. Stable interstitial densities are noted  throughout both lungs most consistent with scarring or fibrosis.  Electronically Signed  By: Sabino Dick M.D.  On: 11/26/2012 12:37   ROS-see HPI Constitutional:   No-   weight loss, night sweats, fevers, chills, fatigue, lassitude. HEENT:   No-  headaches, difficulty swallowing, tooth/dental problems, sore throat,       No-  sneezing, itching, ear ache, nasal congestion, post nasal drip,  CV:  No-   chest pain, orthopnea, PND, swelling in lower extremities, anasarca, dizziness, palpitations Resp: No-   shortness of breath with exertion or at rest.              +  productive cough,  little non-productive cough,  +No more coughing up of blood.              No-   change in color of mucus.  No- wheezing.   Skin: No-   rash or lesions. GI:  No-   heartburn, indigestion, abdominal pain, nausea, vomiting,  GU:  MS:  No-   joint pain or swelling.   Neuro-     nothing unusual Psych:  No- change in mood or affect. No depression or anxiety.  No memory loss.  OBJ- Physical Exam  General- Alert, Oriented, Affect-appropriate, Distress- none acute, slender, talkative Skin- rash-none, lesions- none, excoriation- none Lymphadenopathy- none Head- atraumatic            Eyes- Gross vision intact, PERRLA, conjunctivae and secretions clear            Ears- Hearing aides            Nose- Clear, no-Septal dev, mucus, polyps, erosion, perforation             Throat- Mallampati II , mucosa clear , drainage- none, tonsils- atrophic Neck- flexible , trachea midline, no stridor , thyroid nl, carotid no bruit Chest - symmetrical excursion , unlabored           Heart/CV- RRR , no murmur , no gallop  , no rub, nl s1 s2                            - JVD- none , edema- none, stasis changes- none, varices- none           Lung- +few squeaks, + coarse breath sounds without wheeze or rhonchi,  cough- none , dullness-none, rub- none           Chest wall-  Abd-  Br/ Gen/ Rectal- Not done, not indicated Extrem- cyanosis- none, clubbing, none, atrophy- none, strength- nl Neuro- grossly intact to observation

## 2013-02-16 NOTE — Patient Instructions (Signed)
Prevnar pneumococcal conjugate vaccine 13-   Order Flutter device with instruction     Blow through 4 puffs, three times daily , when needed to help clear your chest  Order- lab- liver panel, BMET   Dx bronchiectasis  Script for doxycycline antibiotic   Order CXR   Dx MAIC, bronchiectasis

## 2013-02-19 ENCOUNTER — Other Ambulatory Visit: Payer: Self-pay

## 2013-02-19 DIAGNOSIS — Z1231 Encounter for screening mammogram for malignant neoplasm of breast: Secondary | ICD-10-CM

## 2013-03-09 DIAGNOSIS — N6009 Solitary cyst of unspecified breast: Secondary | ICD-10-CM | POA: Diagnosis not present

## 2013-03-09 DIAGNOSIS — N952 Postmenopausal atrophic vaginitis: Secondary | ICD-10-CM | POA: Diagnosis not present

## 2013-03-12 DIAGNOSIS — H5000 Unspecified esotropia: Secondary | ICD-10-CM | POA: Diagnosis not present

## 2013-03-12 DIAGNOSIS — Z961 Presence of intraocular lens: Secondary | ICD-10-CM | POA: Diagnosis not present

## 2013-03-12 DIAGNOSIS — H2640 Unspecified secondary cataract: Secondary | ICD-10-CM | POA: Diagnosis not present

## 2013-03-12 DIAGNOSIS — H259 Unspecified age-related cataract: Secondary | ICD-10-CM | POA: Diagnosis not present

## 2013-03-16 ENCOUNTER — Ambulatory Visit
Admission: RE | Admit: 2013-03-16 | Discharge: 2013-03-16 | Disposition: A | Discharge status: Home / Self Care | Payer: Medicare Other | Source: Ambulatory Visit

## 2013-03-16 DIAGNOSIS — Z1231 Encounter for screening mammogram for malignant neoplasm of breast: Secondary | ICD-10-CM | POA: Diagnosis not present

## 2013-03-17 ENCOUNTER — Other Ambulatory Visit: Payer: Self-pay | Admitting: Obstetrics & Gynecology

## 2013-03-17 DIAGNOSIS — R928 Other abnormal and inconclusive findings on diagnostic imaging of breast: Secondary | ICD-10-CM

## 2013-03-22 ENCOUNTER — Encounter: Payer: Self-pay | Admitting: Infectious Disease

## 2013-03-23 ENCOUNTER — Telehealth: Payer: Self-pay | Admitting: *Deleted

## 2013-03-23 ENCOUNTER — Other Ambulatory Visit: Payer: Medicare Other

## 2013-03-23 DIAGNOSIS — Z79899 Other long term (current) drug therapy: Secondary | ICD-10-CM

## 2013-03-23 LAB — HEPATIC FUNCTION PANEL
ALK PHOS: 75 U/L (ref 39–117)
ALT: 31 U/L (ref 0–35)
AST: 43 U/L — ABNORMAL HIGH (ref 0–37)
Albumin: 4.4 g/dL (ref 3.5–5.2)
Bilirubin, Direct: <0.2 mg/dL (ref 0.0–0.3)
Total Bilirubin: 0.4 mg/dL (ref 0.3–1.2)
Total Protein: 7.3 g/dL (ref 6.0–8.3)

## 2013-03-23 NOTE — Addendum Note (Signed)
Addended by: Landis Gandy on: 03/23/2013 09:40 AM   Modules accepted: Orders

## 2013-03-23 NOTE — Telephone Encounter (Signed)
Please see the following patient email and advise: Mia Gandy, RN  _________________________________________  Having pains in abdomen for past month , following medication or meal. Becoming more frequent. Almost daily  Should I be concerned that medicine is affecting my liver?  [name]

## 2013-03-23 NOTE — Telephone Encounter (Addendum)
Patient coming this morning for labs.  Order placed for STAT Hepatic Function Panel.

## 2013-03-23 NOTE — Telephone Encounter (Signed)
I would have pt come in for some stat LFT's

## 2013-03-25 NOTE — Telephone Encounter (Addendum)
Patient notified of the results.  Per Dr. Tommy Medal, it is ok for her to take rifampin 3 times a week while keeping the azithromycin and ethambutol daily.  Still having some dull "lung pain."  Had a chest xray in February, per her report the lesion had increased.  She would like to know if she needs a follow up CT scan.  Pt given appointment with Dr. Tommy Medal 3/26.

## 2013-03-30 ENCOUNTER — Ambulatory Visit
Admission: RE | Admit: 2013-03-30 | Discharge: 2013-03-30 | Disposition: A | Discharge status: Home / Self Care | Payer: Medicare Other | Source: Ambulatory Visit | Attending: Obstetrics & Gynecology | Admitting: Obstetrics & Gynecology

## 2013-03-30 DIAGNOSIS — N6009 Solitary cyst of unspecified breast: Secondary | ICD-10-CM | POA: Diagnosis not present

## 2013-03-30 DIAGNOSIS — R928 Other abnormal and inconclusive findings on diagnostic imaging of breast: Secondary | ICD-10-CM

## 2013-04-02 ENCOUNTER — Ambulatory Visit (INDEPENDENT_AMBULATORY_CARE_PROVIDER_SITE_OTHER): Payer: Medicare Other | Admitting: Infectious Disease

## 2013-04-02 ENCOUNTER — Encounter: Payer: Self-pay | Admitting: Infectious Disease

## 2013-04-02 VITALS — BP 180/109 | HR 88 | Temp 98.0°F | Wt 100.5 lb

## 2013-04-02 DIAGNOSIS — J479 Bronchiectasis, uncomplicated: Secondary | ICD-10-CM

## 2013-04-02 DIAGNOSIS — A310 Pulmonary mycobacterial infection: Secondary | ICD-10-CM

## 2013-04-02 DIAGNOSIS — R109 Unspecified abdominal pain: Secondary | ICD-10-CM | POA: Diagnosis not present

## 2013-04-02 DIAGNOSIS — A318 Other mycobacterial infections: Secondary | ICD-10-CM

## 2013-04-02 DIAGNOSIS — R110 Nausea: Secondary | ICD-10-CM

## 2013-04-02 DIAGNOSIS — R1084 Generalized abdominal pain: Secondary | ICD-10-CM

## 2013-04-02 NOTE — Progress Notes (Signed)
Subjective:    Patient ID: Mia Hernandez, female    DOB: 1940-06-23, 73 y.o.   MRN: [id number]  HPI  73 year old lady with past medical history significant for diagnosis of Mycobacterium avium infection of the lungs last May when it was isolated on culture. She started on therapy with clarithromycin rifampin and ethambutol 3 days per week. She had great difficulty tolerating the Biaxin which was she was taking it twice daily doses on the day she was taking it was reduced to once daily dosing on July 01 2012 Apparently at that time there was concern about toxicity although she states that now that she was found to need simply new hearing aids. Initially while on therapy for Mycobacterium avium showed mprovement in her symptoms of chronic daily cough and sputum production. However this spring she is having worsening cough with copious sputum production.  This fall in [place] apparently she had episodes of hemoptysis and was hospitalized due to Hospital in [place] where she was placed in isolation for rule out tuberculosis. Ultimately was realized that she was suffering from Mycobacterium avium infection alone.   In fact she notices is worsening of productive phlegm on days where she is taking her drugs. She had repeat sputum taken this spring and it is again grow Mycobacterium avium. The organism has not yet been sent for susceptibility testing and showed macrolide sensitivity and in vitro synergy with rifampin and ETH in inhibiting growth in lab.  She has had a recent CT scan of the lungs which has shown an increase in the cavity in the right lower  lung. Her diffuse nodular disease apparently is stable.  I  Changed her to  daily therapy with macrolide (Azithromycin) ethambutol and rifampin. Since change she had been initiallycoughing less, she was gaining weight and was  absent fevers.   She saw Dr. Annamaria Boots in November 2014 when she reported 1 month she is coughing up more yellow/liquid phlem since on  Rafampin. Chest x-ray was done and showed no changes. Sputum was sent for routine culture which only isolated normal oral pharyngeal flora. AFB culture is without AFB seen on smear blood cultures in November DID grow M avium again  Since I last saw her she developed worsening abdominal pain. LFTs were checked and had actually improved. At her request we has her go back to TIW on Rifampin but continued with daily ETH and Azithro.  She has been on this new regimen x nearly 2 weeks.   Abdominal pain is improved. Her cough has not worsened since then.  She is without fevers or weight loss.     Review of Systems  Constitutional: Negative for diaphoresis, activity change, appetite change, fatigue and unexpected weight change.  HENT: Negative for congestion, sinus pressure, sneezing and trouble swallowing.   Eyes: Negative for photophobia and visual disturbance.  Respiratory: Positive for cough. Negative for chest tightness, shortness of breath and stridor.   Cardiovascular: Negative for palpitations and leg swelling.  Gastrointestinal: Positive for nausea. Negative for vomiting, abdominal pain, diarrhea, constipation, blood in stool, abdominal distention and anal bleeding.  Genitourinary: Negative for dysuria, hematuria, flank pain and difficulty urinating.  Musculoskeletal: Negative for arthralgias, back pain, gait problem and joint swelling.  Skin: Negative for color change, pallor and wound.  Neurological: Negative for dizziness, tremors, weakness and light-headedness.  Hematological: Negative for adenopathy. Does not bruise/bleed easily.  Psychiatric/Behavioral: Negative for behavioral problems, sleep disturbance, dysphoric mood, decreased concentration and agitation.  Objective:   Physical Exam  Constitutional: She is oriented to person, place, and time. No distress.  HENT:  Head: Normocephalic and atraumatic.  Mouth/Throat: Oropharynx is clear and moist. No oropharyngeal  exudate.  Eyes: Conjunctivae and EOM are normal. No scleral icterus.  Neck: Normal range of motion. Neck supple. No JVD present.  Cardiovascular: Normal rate, regular rhythm and normal heart sounds.  Exam reveals no gallop and no friction rub.   No murmur heard. Pulmonary/Chest: No respiratory distress. She has wheezes. She has no rales. She exhibits no tenderness.  Prolonged expiratory phase  Abdominal: She exhibits no distension and no mass. There is no tenderness. There is no rebound and no guarding.  Musculoskeletal: She exhibits no edema and no tenderness.  Lymphadenopathy:    She has no cervical adenopathy.  Neurological: She is alert and oriented to person, place, and time. She exhibits normal muscle tone. Coordination normal.  Skin: Skin is warm and dry. She is not diaphoretic. No erythema. No pallor.  Psychiatric: She has a normal mood and affect. Her behavior is normal. Judgment and thought content normal.          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  I spent greater than 45 minutes with the patient including greater than 50% of time in face to face counsel of the patient and in coordination of their care.   #2 Bronchiectasis: due to #1 with cavity: there is risk of fungal infection prickly for aspergilloma in this cavity to be need to be watched carefully. --Need to continue to  have efforts made to maximize expectoration of sputum and "pulmonary toilet"   #3 GI upset: indeed likely due to Rifampin and improved with reduced dose  #3 need for influenza vaccination : up to date on yearly flu vaccine  #4 LFT up: cut down on ETOH but LFTS really not that bad at all

## 2013-04-24 ENCOUNTER — Ambulatory Visit (INDEPENDENT_AMBULATORY_CARE_PROVIDER_SITE_OTHER): Payer: Medicare Other | Admitting: Cardiovascular Disease

## 2013-04-24 VITALS — BP 166/100 | HR 88 | Resp 16 | Ht 60.0 in | Wt 101.3 lb

## 2013-04-24 DIAGNOSIS — I10 Essential (primary) hypertension: Secondary | ICD-10-CM | POA: Diagnosis not present

## 2013-04-24 NOTE — Patient Instructions (Signed)
Dr. Croitoru recommends that you schedule a follow-up appointment in: 6 months    

## 2013-04-25 ENCOUNTER — Encounter: Payer: Self-pay | Admitting: Cardiovascular Disease

## 2013-04-25 NOTE — Progress Notes (Signed)
Patient ID: Mia Hernandez, female   DOB: Dec 07, 1940, 73 y.o.   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 She is now receiving treatment with azithromycin rifampin and ethambutol. She finds that she has numerous side effects to these medications and she has insisted that her dose of medication be reduced. When she did this she believes her blood pressure readings diminished and became closer normal range. She brings today a detailed log of her blood pressure of the last several weeks. For the most part her blood pressures in the 120-150/70-80 mmHg range. Occasional elevated diastolic blood pressures are noted.   She is quite emotional especially when she thinks about her pulmonary problems. When she first checked into the office her blood pressure was 166/100. When I rechecked it 10 minutes later it was down to 151/95. She does not have any cardiovascular complaints and remains very active.   Allergies  Allergen Reactions  . Latex Rash  . Aspirin   . Betadine [Povidone Iodine]   . Codeine   . Iodinated Diagnostic Agents   . Morphine And Related   . Penicillins   . Prednisone     "out of mind" state  . Sulfa Antibiotics     Current Outpatient Prescriptions  Medication Sig Dispense Refill  . amLODipine (NORVASC) 5 MG tablet Take 1 tablet (5 mg total) by mouth daily.  90 tablet  3  . azithromycin (ZITHROMAX) 500 MG tablet Take 1 tablet (500 mg total) by mouth daily.  90 tablet  3  . ethambutol (MYAMBUTOL) 400 MG tablet Take 400 mg by mouth every other day. Take 1 400mg  tablet with 3 100mg  tablets for 700mg  daily      . rifampin (RIFADIN) 300 MG capsule Take 600 mg by mouth daily. Taking Sun/Tues/Thurs.       No current facility-administered medications for this visit.    Past Medical History  Diagnosis Date  . Osteoporosis   .  ALLERGIC RHINITIS   . Chronic back pain   . Mycobacterium avium-intracellulare infection   . Bronchiectasis     Past Surgical History  Procedure Laterality Date  . Cholecystectomy    . Vaginal hysterectomy    . Laminectomy    . Breast biopsy      Family History  Problem Relation Age of Onset  . Breast cancer Mother   . Stroke Father   . Pulmonary embolism Brother     History   Social History  . Marital Status: Married    Spouse Name: N/A    Number of Children: N/A  . Years of Education: N/A   Occupational History  . retired    Social History Main Topics  . Smoking status: Never Smoker   . Smokeless tobacco: Not on file  . Alcohol Use: 0.6 oz/week    1 Glasses of wine per week     Comment: at dinner  . Drug Use: No  . Sexual Activity: Not on file   Other Topics Concern  . Not on file   Social History Narrative  . No narrative on file    Review of systems: The patient specifically denies any chest pain at rest or with exertion, dyspnea at rest or with exertion, orthopnea, paroxysmal nocturnal dyspnea, syncope, palpitations, focal neurological deficits, intermittent claudication, lower extremity edema, unexplained weight gain, hemoptysis or  wheezing. She has a cough that has been on for many months  The patient also denies abdominal pain, nausea, vomiting, dysphagia, diarrhea, constipation, polyuria, polydipsia, dysuria, hematuria, frequency, urgency, abnormal bleeding or bruising, fever, chills, unexpected weight changes, mood swings, change in skin or hair texture, change in voice quality, auditory or visual problems, allergic reactions or rashes, new musculoskeletal complaints other than usual "aches and pains".   PHYSICAL EXAM BP 166/100  Pulse 88  Resp 16  Ht 5' (1.524 m)  Wt 101 lb 4.8 oz (45.949 kg)  BMI 19.78 kg/m2  General: Alert, oriented x3, no distress Head: no evidence of trauma, PERRL, EOMI, no exophtalmos or lid lag, no myxedema, no  xanthelasma; normal ears, nose and oropharynx Neck: normal jugular venous pulsations and no hepatojugular reflux; brisk carotid pulses without delay and no carotid bruits Chest: In the right lower lung there are a few rhonchi and wheezes, otherwise clear to auscultation, no signs of consolidation by percussion or palpation, normal fremitus, symmetrical and full respiratory excursions Cardiovascular: normal position and quality of the apical impulse, regular rhythm, normal first and second heart sounds, no murmurs, rubs or gallops Abdomen: no tenderness or distention, no masses by palpation, no abnormal pulsatility or arterial bruits, normal bowel sounds, no hepatosplenomegaly Extremities: no clubbing, cyanosis or edema; 2+ radial, ulnar and brachial pulses bilaterally; 2+ right femoral, posterior tibial and dorsalis pedis pulses; 2+ left femoral, posterior tibial and dorsalis pedis pulses; no subclavian or femoral bruits Neurological: grossly nonfocal   EKG: Sinus rhythm with a minor intraventricular conduction delay possible left atrial enlargement  BMET    Component Value Date/Time   NA 138 02/16/2013 1522   K 3.9 02/16/2013 1522   CL 100 02/16/2013 1522   CO2 27 02/16/2013 1522   GLUCOSE 100* 02/16/2013 1522   BUN 14 02/16/2013 1522   CREATININE 0.7 02/16/2013 1522   CREATININE 0.54 12/08/2012 1109   CALCIUM 9.5 02/16/2013 1522   GFRNONAA >89 12/08/2012 1109   GFRAA >89 12/08/2012 1109     ASSESSMENT AND PLAN  Mia Hernandez blood pressure is clearly related to her mental anguish over her chronic illness. Whenever she checks her blood pressure and she is truly relaxed, the readings are quite normal. I think she should continue the current amlodipine 5 mg once daily and do not think additional adjustment as necessary. If she records a very high blood pressure reading, I have advised that she try to relax for 10-20 minutes in a quiet dark room before rechecking her blood pressure.  Meds ordered this  encounter  Medications  . rifampin (RIFADIN) 300 MG capsule    Sig: Take 600 mg by mouth daily. Taking Sun/Tues/Thurs.  . ethambutol (MYAMBUTOL) 400 MG tablet    Sig: Take 400 mg by mouth every other day. Take 1 400mg  tablet with 3 100mg  tablets for 700mg  daily    Mia Hernandez  Sanda Klein, MD, [facility] [phone] office [phone] pager

## 2013-04-27 ENCOUNTER — Encounter: Payer: Self-pay | Admitting: Infectious Disease

## 2013-04-27 ENCOUNTER — Telehealth: Payer: Self-pay | Admitting: *Deleted

## 2013-04-27 NOTE — Telephone Encounter (Signed)
Please see the following message from the patient and advise.  Thank you. __________________________________ Decreased ethambutol from 700 mg daily to 700mg  to Tues, Thurs, Fri, Sat.  Reason: Pains in sides and Back  Since decreasing the pains have ceased

## 2013-04-28 NOTE — Telephone Encounter (Signed)
Thanks

## 2013-04-28 NOTE — Telephone Encounter (Signed)
Relayed to patient.

## 2013-04-28 NOTE — Telephone Encounter (Signed)
I am ok with that there is risk in tinering with these doses that her regimen may not be as effective but certain versions of this condition can be treated with less frequent dosing. Hers did not do as well with TIW dosing for ex

## 2013-05-04 ENCOUNTER — Other Ambulatory Visit: Payer: Self-pay | Admitting: *Deleted

## 2013-05-04 DIAGNOSIS — A319 Mycobacterial infection, unspecified: Secondary | ICD-10-CM

## 2013-05-04 MED ORDER — AZITHROMYCIN 500 MG PO TABS: 500.0000 mg | ORAL_TABLET | Freq: Every day | ORAL | Status: DC

## 2013-05-05 ENCOUNTER — Encounter: Payer: Self-pay | Admitting: Infectious Disease

## 2013-05-05 NOTE — Telephone Encounter (Signed)
Dr Tommy Medal please see above message from patient sent in through Email and advise what you would like her to do about problems.

## 2013-05-07 ENCOUNTER — Other Ambulatory Visit: Payer: Self-pay | Admitting: Licensed Clinical Social Worker

## 2013-05-07 DIAGNOSIS — A319 Mycobacterial infection, unspecified: Secondary | ICD-10-CM

## 2013-05-07 MED ORDER — AZITHROMYCIN 500 MG PO TABS: 500.0000 mg | ORAL_TABLET | Freq: Every day | ORAL | Status: DC

## 2013-05-25 ENCOUNTER — Encounter: Payer: Self-pay | Admitting: Internal Medicine

## 2013-05-25 ENCOUNTER — Ambulatory Visit (INDEPENDENT_AMBULATORY_CARE_PROVIDER_SITE_OTHER): Payer: Medicare Other | Admitting: Internal Medicine

## 2013-05-25 VITALS — BP 142/78 | HR 88 | Ht 60.0 in | Wt 103.8 lb

## 2013-05-25 DIAGNOSIS — A319 Mycobacterial infection, unspecified: Secondary | ICD-10-CM

## 2013-05-25 DIAGNOSIS — A310 Pulmonary mycobacterial infection: Secondary | ICD-10-CM

## 2013-05-25 DIAGNOSIS — J309 Allergic rhinitis, unspecified: Secondary | ICD-10-CM

## 2013-05-25 DIAGNOSIS — J479 Bronchiectasis, uncomplicated: Secondary | ICD-10-CM | POA: Diagnosis not present

## 2013-05-25 NOTE — Patient Instructions (Signed)
Order- CT chest no contrast, dx atypical AFB

## 2013-05-25 NOTE — Progress Notes (Signed)
04/18/11- 73 yo F never smoker referred by Dr Reynaldo Minium because of cough. Persistent cough since January. In late February she was treated with 7 days of Levaquin after chest x-ray showed some density. Cough is better in warm moist air, worse when out in pollen or when active and better at night. Cough improved with Levaquin but persists. Notices postnasal drip. Saw ENT/Dr.Wolicki who didn't think there was enough of sinus problem to be of concern. Cough is dry. She denies enlarged glands, fever, sweat or weight loss. Denies history of lung or nasal condition. She has been told that she has some wheeze in the right lung, going back years. She insists she has always been healthy and athletic, never aware of shortness of breath or breathing problems. Denies GERD. Remote history of walking pneumonia. As a small child mother had tuberculosis. No PPD in many years but not remembered  as positive. Chest x-ray 02/26/2011-right-sided infiltrate recommend followup. Chest x-ray 03/07/2011-COPD, prominent linear markings in the right middle lobe may be due to atelectasis or scarring but pneumonia can't be excluded.  05/11/11-  4 yoF never smoker followed for cough     PCP Dr Reynaldo Minium      Husband here Denies seasonal rhinitis complaints. Still some dry cough with a sense there is mucus in her throat. Dr Erik Obey ENT gave Z-Pak then Cleocin now for 30 days. She denies shortness of breath [place] chest 04/18/11- reviewed w/ them-- IMPRESSION:  Diffuse peribronchovascular nodularity, right greater than left,  with areas of bronchiectasis and mild cavitation. 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 Follow-up of the  dominant cavitary lesion in the right lower lobe is recommended in  4-6 weeks to ensure stability, as a developing abscess cannot be  excluded. Similarly, although felt to be less likely, malignancy  cannot be definitively excluded and continued consideration on   follow-up exams is warranted.  Original Report Authenticated By: Luretha Rued, M.D.  PFT 05/10/11- mild obstructive airways disease, small airways, insignificant response to bronchodilator. air trapping, hyperinflation, normal diffusion FEV1 1.81/111%, FEV1/FVC 0.71, FEF 25-75% 1.19/59%. TLC 134%, RV 174%, DLCO 115%.  07/02/11- 70 yoF never smoker followed for cough, +MAIC/ triple therapy    PCP Dr Reynaldo Minium      Husband here Slow 1 little streak of blood in mucus as a single event. Mostly thin clear mucus now. Denies fever, night sweats, nodes. Of her 3 drugs, Biaxin bothers her the most and she thinks it is affecting her hearing. She wears hearing aids already, fitted by the audiologist at [facility] Dr. Kathrin Penner has already checked her eyes on ethambutol. Sputum Cx - 06/07/11- + M.avium CXR 06/12/11- reviewed IMPRESSION:  Stable radiographic appearance of the chest since 03/07/2011.  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 Continued follow-up of the right lower lobe cavitary  lesion is recommended.  Original Report Authenticated By: Randall An, M.D.   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 Feeling well. Not coughing at all-cough was her original complaint. Tolerating medications well as we reduced Biaxin from 1500 mg 3 days per week. That was because of concern about her hearing.  She suspects now there was no connection. She denies fever, sweat, chills. Scant sputum is clear. CXR 08/31/11- reviewed with her IMPRESSION:  1. Persistent cavitary lesion in the right lower lobe. Favor  Mycobacterium avium intracellular. Recommend ongoing radiographic  follow-up.  2. Similar appearance of interstitial thickening and right middle  lobe bronchiectasis.  Original Report Authenticated By: Areta Haber, M.D.   12/21/11- 18 yoF never smoker followed for cough, +MAIC/ triple therapy    PCP Dr Reynaldo Minium  FOLLOWS FOR: recently in hospital-[place] 12/05/11 for hemoptysis; denies any wheezing, SOB, cough, or congestion CT 12/05/11- [place]- RLL cavitary lesion, sub-centimeter satellite foci in RML, RLL, with COPD levaquin was added to her triple therapy ( which began 06/18/11). No prior or subsequent heme.  CXR 08/31/11   IMPRESSION:  1. Persistent cavitary lesion in the right lower lobe. Favor  Mycobacterium avium intracellular. Recommend ongoing radiographic  follow-up.  2. Similar appearance of interstitial thickening and right middle  lobe bronchiectasis.  Original Report Authenticated By: Areta Haber, M.D.   03/20/12- 81 yoF never smoker followed for cough, +MAIC/ triple therapy began 06/18/11    PCP Dr Reynaldo Minium   Husband here  FOLLOWS FOR: feels like she can breathe deeper than before, Had coughing (productive-in mornings;yellow/green to white in color) since last bleeding episode she had(3 times); also head cold recently.Continues triple therapy for [facility]. Third and latest hemoptysis was only slight- in January. Took extra biaxin then. Most days morning cough, scant, clear mucus. Just got over a cold 1 week ago.   05/22/12- 71 yoF never smoker followed for cough, +MAIC/ triple therapy began 06/18/11    PCP Dr Reynaldo Minium   Husband here FOLLOWS FOR: Morning times-has cough spell and gets little aount of phlegm-lite in color. Sputum Cx AFB again POS [facility]  03/21/12 She has felt somewhat better in the last few weeks with more energy but still productive cough with scant yellow sputum. No sweats or fevers, adenopathy, or recent hemoptysis CXR 04/28/12 IMPRESSION:  1. No significant change in cavitary lung lesion within the right  lower lobe.  2. Chronic lung abnormality is stable compared with previous exam.  Original Report Authenticated By: Kerby Moors, M.D.  11/24/12- 76 yoF never smoker followed for cough, +MAIC/ triple therapy began 06/18/11    PCP Dr Reynaldo Minium   Husband here Bronchiectasis. Pt reports breathing has unchanged. for about 1 month she is coughing up more yellow/liquid phlegm since on Rafampin She declines flu vaccine and pneumonia vaccine, reporting large local reaction in the past. Discussed. Denies fever or night sweats. Increased cough productive yellow sputum. No blood. Sputum culture for AFB negative in 6 weeks, obtain 07/25/2012 Has been seeing Infectious Disease:  --azithromycin 500 mg daily  --Ethambutol 700 mg daily  --Rifampin 600 mg daily CT 05/22/12 IMPRESSION:  1. Diffuse peribronchovascular nodularity, right greater than  left, with areas of bronchiectasis and cavitation. 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  2. The dominant cavitary lesion in the right lower lobe has  increased in size from previous exam.  Original Report Authenticated By: Kerby Moors, M.D.  02/16/13- 71 yoF never smoker followed for cough, +MAIC/ triple therapy began 06/18/11    PCP Dr Reynaldo Minium   Husband here FOLLOWS FOR: has been doing well since last visit; husband states she continues to cough alot. AFB cx 11/26/12 again POS MAIC Followed by [facility]/ Dr Wendie Agreste and has been on daily triple therapy since June. We reviewed her x-rays and liver function tests, noting minor elevation of transaminase. She is breathing well, denies fever or sweats" functioning fine". She complains of very productive cough,  especially in the mornings and after sitting. Rare specks of blood. Sputum is usually watery clear with slight yellow. CXR 11/26/12 IMPRESSION:  Stable appearance of cavitary lesion seen in right lower lobe  compared to prior exam. Stable interstitial densities are noted  throughout both lungs most consistent with scarring or fibrosis.  Electronically Signed  By: Sabino Dick M.D.  On: 11/26/2012 12:37  05/25/13- 16 yoF never smoker followed for cough, +MAIC/ triple therapy began 06/18/11    PCP Dr Reynaldo Minium   Husband here                     Lucianne Lei Dam/ Infectious Disease FOLLOWS FOR: Cough-productive-clear to yellow at times, Denies any SOB or wheezing. She had asked Dr Tommy Medal about stopping her meds.  Frequent cough-clear sputum, no blood. Rhinorrhea. She had a variety of symptomatic complaints she attributed to her antibiotic therapy. Doses were adjusted.  ROS-see HPI Constitutional:   No-   weight loss, night sweats, fevers, chills, fatigue, lassitude. HEENT:   No-  headaches, difficulty swallowing, tooth/dental problems, sore throat,       No-  sneezing, itching, ear ache, nasal congestion, post nasal drip,  CV:  No-   chest pain, orthopnea, PND, swelling in lower extremities, anasarca, dizziness, palpitations Resp: No-   shortness of breath with exertion or at rest.              +  productive cough,  little non-productive cough,  +No more coughing up of blood.              No-   change in color of mucus.  No- wheezing.   Skin: No-   rash or lesions. GI:  No-   heartburn, indigestion, abdominal pain, nausea, vomiting,  GU:  MS:  No-   joint pain or swelling.   Neuro-     nothing unusual Psych:  No- change in mood or affect. No depression or anxiety.  No memory loss.  OBJ- Physical Exam  General- Alert, Oriented, Affect-appropriate, Distress- none acute, slender, talkative Skin- rash-none, lesions- none, excoriation- none Lymphadenopathy- none Head- atraumatic            Eyes-  Gross vision intact, PERRLA, conjunctivae and secretions clear            Ears- Hearing aides            Nose- Clear, no-Septal dev, mucus, polyps, erosion, perforation             Throat- Mallampati II , mucosa clear , drainage- none, tonsils- atrophic Neck- flexible , trachea midline, no stridor , thyroid nl, carotid no bruit Chest - symmetrical excursion , unlabored           Heart/CV- RRR , no murmur , no gallop  , no rub, nl s1 s2                           -  JVD- none , edema- none, stasis changes- none, varices- none           Lung- +wet crackles right mid back, + coarse breath sounds without wheeze or rhonchi,                     cough- none , dullness-none, rub- none           Chest wall-  Abd-  Br/ Gen/ Rectal- Not done, not indicated Extrem- cyanosis- none, clubbing, none, atrophy- none, strength- nl Neuro- grossly intact to observation

## 2013-05-29 ENCOUNTER — Ambulatory Visit (INDEPENDENT_AMBULATORY_CARE_PROVIDER_SITE_OTHER)
Admission: RE | Admit: 2013-05-29 | Discharge: 2013-05-29 | Disposition: A | Discharge status: Home / Self Care | Payer: Medicare Other | Source: Ambulatory Visit | Attending: Internal Medicine | Admitting: Internal Medicine

## 2013-05-29 DIAGNOSIS — J479 Bronchiectasis, uncomplicated: Secondary | ICD-10-CM | POA: Diagnosis not present

## 2013-06-01 ENCOUNTER — Encounter: Payer: Self-pay | Admitting: Cardiovascular Disease

## 2013-06-03 ENCOUNTER — Telehealth: Payer: Self-pay | Admitting: *Deleted

## 2013-06-03 ENCOUNTER — Telehealth: Payer: Self-pay | Admitting: Internal Medicine

## 2013-06-03 NOTE — Telephone Encounter (Signed)
Spoke with patient-she is aware of results and understands aorta issue.

## 2013-06-03 NOTE — Telephone Encounter (Signed)
Patient has not received results from Dr. Annamaria Boots.  Suggested to contact his office for advise and if he wants Dr. Loletha Grayer to do anything he will notify him. Probably will continue monitoring at this time but this is entirely up to Dr. Annamaria Boots.  Patient voiced understanding and will call his office.

## 2013-06-03 NOTE — Telephone Encounter (Signed)
LMOMTCB x 1 

## 2013-06-03 NOTE — Telephone Encounter (Signed)
Result Notes    Notes Recorded by Deneise Lever, MD on 05/29/2013 at 3:58 PM CT chest- probably little change in nodular scarring and the cavity asociated with her chronic MAIC disease   Spoke with CY about the aorta issue-he states that this was just noted for [place] to watch and is not an urgent or serious matter at this time.    [facility]

## 2013-06-03 NOTE — Telephone Encounter (Signed)
Patient returning call.

## 2013-06-30 ENCOUNTER — Ambulatory Visit (INDEPENDENT_AMBULATORY_CARE_PROVIDER_SITE_OTHER): Payer: Medicare Other | Admitting: Internal Medicine

## 2013-06-30 ENCOUNTER — Encounter: Payer: Self-pay | Admitting: Internal Medicine

## 2013-06-30 ENCOUNTER — Other Ambulatory Visit (INDEPENDENT_AMBULATORY_CARE_PROVIDER_SITE_OTHER): Payer: Medicare Other

## 2013-06-30 VITALS — BP 114/72 | HR 69 | Ht 60.0 in | Wt 104.0 lb

## 2013-06-30 DIAGNOSIS — A310 Pulmonary mycobacterial infection: Secondary | ICD-10-CM | POA: Diagnosis not present

## 2013-06-30 DIAGNOSIS — J479 Bronchiectasis, uncomplicated: Secondary | ICD-10-CM

## 2013-06-30 LAB — HEPATIC FUNCTION PANEL
ALT: 29 U/L (ref 0–35)
AST: 37 U/L (ref 0–37)
Albumin: 4.7 g/dL (ref 3.5–5.2)
Alkaline Phosphatase: 62 U/L (ref 39–117)
BILIRUBIN DIRECT: 0.1 mg/dL (ref 0.0–0.3)
BILIRUBIN TOTAL: 0.6 mg/dL (ref 0.2–1.2)
Total Protein: 7.3 g/dL (ref 6.0–8.3)

## 2013-06-30 NOTE — Assessment & Plan Note (Signed)
On chronic triple therapy managed by Infectious Disease Plan-hepatic enzyme panel at her request, watching for liver toxicity from drugs

## 2013-06-30 NOTE — Assessment & Plan Note (Signed)
Chronic right lower lobe cavity is stable with relatively thick wall and no evidence of mycetoma or air-fluid level

## 2013-06-30 NOTE — Progress Notes (Signed)
04/18/11- 73 yo F never smoker referred by Dr Reynaldo Minium because of cough. Persistent cough since January. In late February she was treated with 7 days of Levaquin after chest x-ray showed some density. Cough is better in warm moist air, worse when out in pollen or when active and better at night. Cough improved with Levaquin but persists. Notices postnasal drip. Saw ENT/Dr.Wolicki who didn't think there was enough of sinus problem to be of concern. Cough is dry. She denies enlarged glands, fever, sweat or weight loss. Denies history of lung or nasal condition. She has been told that she has some wheeze in the right lung, going back years. She insists she has always been healthy and athletic, never aware of shortness of breath or breathing problems. Denies GERD. Remote history of walking pneumonia. As a small child mother had tuberculosis. No PPD in many years but not remembered  as positive. Chest x-ray 02/26/2011-right-sided infiltrate recommend followup. Chest x-ray 03/07/2011-COPD, prominent linear markings in the right middle lobe may be due to atelectasis or scarring but pneumonia can't be excluded.  05/11/11-  4 yoF never smoker followed for cough     PCP Dr Reynaldo Minium      Husband here Denies seasonal rhinitis complaints. Still some dry cough with a sense there is mucus in her throat. Dr Erik Obey ENT gave Z-Pak then Cleocin now for 30 days. She denies shortness [place] chest 04/18/11- reviewed w/ them-- IMPRESSION:  Diffuse peribronchovascular nodularity, right greater than left,  with areas of bronchiectasis and mild cavitation. 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 Follow-up of the  dominant cavitary lesion in the right lower lobe is recommended in  4-6 weeks to ensure stability, as a developing abscess cannot be  excluded. Similarly, although felt to be less likely, malignancy  cannot be definitively excluded and continued consideration on   follow-up exams is warranted.  Original Report Authenticated By: Luretha Rued, M.D.  PFT 05/10/11- mild obstructive airways disease, small airways, insignificant response to bronchodilator. air trapping, hyperinflation, normal diffusion FEV1 1.81/111%, FEV1/FVC 0.71, FEF 25-75% 1.19/59%. TLC 134%, RV 174%, DLCO 115%.  07/02/11- 70 yoF never smoker followed for cough, +MAIC/ triple therapy    PCP Dr Reynaldo Minium      Husband here Slow 1 little streak of blood in mucus as a single event. Mostly thin clear mucus now. Denies fever, night sweats, nodes. Of her 3 drugs, Biaxin bothers her the most and she thinks it is affecting her hearing. She wears hearing aids already, fitted by the audiologist at [facility] Dr. Kathrin Penner has already checked her eyes on ethambutol. Sputum Cx - 06/07/11- + M.avium CXR 06/12/11- reviewed IMPRESSION:  Stable radiographic appearance of the chest since 03/07/2011.  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 Continued follow-up of the right lower lobe cavitary  lesion is recommended.  Original Report Authenticated By: Randall An, M.D.   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 Feeling well. Not coughing at all-cough was her original complaint. Tolerating medications well as we reduced Biaxin from 1500 mg 3 days per week. That was because of concern about her hearing.  She suspects now there was no connection. She denies fever, sweat, chills. Scant sputum is clear. CXR 08/31/11- reviewed with her IMPRESSION:  1. Persistent cavitary lesion in the right lower lobe. Favor  Mycobacterium avium intracellular. Recommend ongoing radiographic  follow-up.  2. Similar appearance of interstitial thickening and right middle  lobe bronchiectasis.  Original Report Authenticated By: Areta Haber, M.D.   12/21/11- 18 yoF never smoker followed for cough, +MAIC/ triple therapy    PCP Dr Reynaldo Minium  FOLLOWS FOR: recently in hospital-[place] 12/05/11 for hemoptysis; denies any wheezing, SOB, cough, or congestion CT 12/05/11- [place]- RLL cavitary lesion, sub-centimeter satellite foci in RML, RLL, with COPD levaquin was added to her triple therapy ( which began 06/18/11). No prior or subsequent heme.  CXR 08/31/11   IMPRESSION:  1. Persistent cavitary lesion in the right lower lobe. Favor  Mycobacterium avium intracellular. Recommend ongoing radiographic  follow-up.  2. Similar appearance of interstitial thickening and right middle  lobe bronchiectasis.  Original Report Authenticated By: Areta Haber, M.D.   03/20/12- 81 yoF never smoker followed for cough, +MAIC/ triple therapy began 06/18/11    PCP Dr Reynaldo Minium   Husband here  FOLLOWS FOR: feels like she can breathe deeper than before, Had coughing (productive-in mornings;yellow/green to white in color) since last bleeding episode she had(3 times); also head cold recently.Continues triple therapy for [facility]. Third and latest hemoptysis was only slight- in January. Took extra biaxin then. Most days morning cough, scant, clear mucus. Just got over a cold 1 week ago.   05/22/12- 71 yoF never smoker followed for cough, +MAIC/ triple therapy began 06/18/11    PCP Dr Reynaldo Minium   Husband here FOLLOWS FOR: Morning times-has cough spell and gets little aount of phlegm-lite in color. Sputum Cx AFB again POS [facility]  03/21/12 She has felt somewhat better in the last few weeks with more energy but still productive cough with scant yellow sputum. No sweats or fevers, adenopathy, or recent hemoptysis CXR 04/28/12 IMPRESSION:  1. No significant change in cavitary lung lesion within the right  lower lobe.  2. Chronic lung abnormality is stable compared with previous exam.  Original Report Authenticated By: Kerby Moors, M.D.  11/24/12- 76 yoF never smoker followed for cough, +MAIC/ triple therapy began 06/18/11    PCP Dr Reynaldo Minium   Husband here Bronchiectasis. Pt reports breathing has unchanged. for about 1 month she is coughing up more yellow/liquid phlegm since on Rafampin She declines flu vaccine and pneumonia vaccine, reporting large local reaction in the past. Discussed. Denies fever or night sweats. Increased cough productive yellow sputum. No blood. Sputum culture for AFB negative in 6 weeks, obtain 07/25/2012 Has been seeing Infectious Disease:  --azithromycin 500 mg daily  --Ethambutol 700 mg daily  --Rifampin 600 mg daily CT 05/22/12 IMPRESSION:  1. Diffuse peribronchovascular nodularity, right greater than  left, with areas of bronchiectasis and cavitation. 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  2. The dominant cavitary lesion in the right lower lobe has  increased in size from previous exam.  Original Report Authenticated By: Kerby Moors, M.D.  02/16/13- 71 yoF never smoker followed for cough, +MAIC/ triple therapy began 06/18/11    PCP Dr Reynaldo Minium   Husband here FOLLOWS FOR: has been doing well since last visit; husband states she continues to cough alot. AFB cx 11/26/12 again POS MAIC Followed by [facility]/ Dr Wendie Agreste and has been on daily triple therapy since June. We reviewed her x-rays and liver function tests, noting minor elevation of transaminase. She is breathing well, denies fever or sweats" functioning fine". She complains of very productive cough,  especially in the mornings and after sitting. Rare specks of blood. Sputum is usually watery clear with slight yellow. CXR 11/26/12 IMPRESSION:  Stable appearance of cavitary lesion seen in right lower lobe  compared to prior exam. Stable interstitial densities are noted  throughout both lungs most consistent with scarring or fibrosis.  Electronically Signed  By: Sabino Dick M.D.  On: 11/26/2012 12:37  05/25/13- 21 yoF never smoker followed for cough, +MAIC/ triple therapy began 06/18/11    PCP Dr Reynaldo Minium   Husband here                     Lucianne Lei Dam/ Infectious Disease FOLLOWS FOR: Cough-productive-clear to yellow at times, Denies any SOB or wheezing. She had asked Dr Tommy Medal about stopping her meds.   06/30/13- 72 yoF never smoker followed for cough, +MAIC/ triple therapy began 06/18/11    PCP Dr Reynaldo Minium   Husband here                     Lucianne Lei Dam/ Infectious Disease FOLLOWS FOR: review most recent CT with patient; pt states she is breathing well since last visit and continues to abx's. Now almost 2 years on triple therapy. Some cough daily, productive, white. 2 blood spots one month ago. Abdominal bloating better. Thinks sometimes her speech is more hesitant on the days she takes Ritalin. Last positive AFB sputum 11/26/2012. CT 05/29/13- images reviewed with them today IMPRESSION:  Re- demonstrated diffuse peribronchovascular nodularity within the  right greater than left lungs with associated bronchiectasis and  cavitation, particularly within the right lower lobe. 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  Prominent cavitary lesion within the right lower lobe is grossly  stable from recent prior however has increased in size from more  remote prior exams.  Electronically Signed:  By: Lovey Newcomer M.D.  On: 05/29/2013 12:02   ROS-see HPI Constitutional:   No-   weight loss, night sweats, fevers, chills, fatigue, lassitude. HEENT:   No-  headaches, difficulty  swallowing, tooth/dental problems, sore throat,       No-  sneezing, itching, ear ache, nasal congestion, post nasal drip,  CV:  No-   chest pain, orthopnea, PND, swelling in lower extremities, anasarca, dizziness, palpitations Resp: No-   shortness of breath with exertion or at rest.              +  productive cough,  little non-productive cough,  No recent coughing up of blood.              No-   change in color of mucus.  No- wheezing.   Skin: No-   rash or lesions. GI:  No-   heartburn, indigestion, abdominal pain, nausea, vomiting,  GU:  MS:  No-   joint pain or swelling.   Neuro-     nothing unusual Psych:  No-  change in mood or affect. No depression or anxiety.  No memory loss.  OBJ- Physical Exam  General- Alert, Oriented, Affect-appropriate, Distress- none acute, slender, talkative Skin- rash-none, lesions- none, excoriation- none Lymphadenopathy- none Head- atraumatic            Eyes- Gross vision intact, PERRLA, conjunctivae and secretions clear            Ears- Hearing aides            Nose- Clear, no-Septal dev, mucus, polyps, erosion, perforation             Throat- Mallampati II , mucosa clear , drainage- none, tonsils- atrophic Neck- flexible , trachea midline, no stridor , thyroid nl, carotid no bruit Chest - symmetrical excursion , unlabored           Heart/CV- RRR , no murmur , no gallop  , no rub, nl s1 s2                           - JVD- none , edema- none, stasis changes- none, varices- none           Lung- +few crackles right chest, + coarse breath sounds without wheeze or rhonchi,                   cough- none , dullness-none, rub- none           Chest wall-  Abd-  Br/ Gen/ Rectal- Not done, not indicated Extrem- cyanosis- none, clubbing, none, atrophy- none, strength- nl Neuro- grossly intact to observation

## 2013-06-30 NOTE — Patient Instructions (Addendum)
Order- lab- hepatic enzyme panel     Dx bronchiectasis

## 2013-07-11 DIAGNOSIS — J310 Chronic rhinitis: Secondary | ICD-10-CM | POA: Insufficient documentation

## 2013-07-11 NOTE — Assessment & Plan Note (Signed)
Plan-offered trial of ipratropium nasal spray. She did not want to have another medication

## 2013-07-11 NOTE — Assessment & Plan Note (Signed)
Long-term management buy Dr Lucianne Lei Dam/ ID

## 2013-07-11 NOTE — Assessment & Plan Note (Signed)
Satisfactory control with no recent hemoptysis. Plan-followup chest CT scan as planned

## 2013-07-15 DIAGNOSIS — T148 Other injury of unspecified body region: Secondary | ICD-10-CM | POA: Diagnosis not present

## 2013-07-15 DIAGNOSIS — W57XXXA Bitten or stung by nonvenomous insect and other nonvenomous arthropods, initial encounter: Secondary | ICD-10-CM | POA: Diagnosis not present

## 2013-07-16 ENCOUNTER — Telehealth: Payer: Self-pay | Admitting: *Deleted

## 2013-07-16 NOTE — Telephone Encounter (Signed)
Pt requesting refill of ethambutol.  Please clarify her regimen, as it appears she reports she is taking it other than directed at her last pulmonology appointment. Landis Gandy, RN

## 2013-07-16 NOTE — Telephone Encounter (Signed)
She is constantly tinkering with her own regimen. I dont know what she is taking at present but it is likely less that I originally prescribed. What is she saying she is taking?

## 2013-07-22 NOTE — Telephone Encounter (Signed)
Pt states she does not need refills, that she actually gets it through [employer].

## 2013-07-27 ENCOUNTER — Encounter: Payer: Self-pay | Admitting: Infectious Disease

## 2013-07-27 ENCOUNTER — Telehealth: Payer: Self-pay | Admitting: *Deleted

## 2013-07-27 NOTE — Telephone Encounter (Signed)
Notified patient.

## 2013-07-27 NOTE — Telephone Encounter (Signed)
Please see the following patient email and advise. ________________  I have been coughing up blood Saturday and Sunday mornings and today. Have seen amounts ranging from small flecks to a teaspoon full of bright red blood. Had not taken Rifampin for two days prior to Saturday. Have started back as of Sunday. Is there anything I should do prior to my appointment Wednesday?

## 2013-07-27 NOTE — Telephone Encounter (Signed)
Nothting to do

## 2013-07-29 ENCOUNTER — Encounter: Payer: Self-pay | Admitting: Infectious Disease

## 2013-07-29 ENCOUNTER — Ambulatory Visit (INDEPENDENT_AMBULATORY_CARE_PROVIDER_SITE_OTHER): Payer: Medicare Other | Admitting: Infectious Disease

## 2013-07-29 VITALS — BP 190/111 | HR 88 | Temp 98.0°F | Wt 103.5 lb

## 2013-07-29 DIAGNOSIS — A310 Pulmonary mycobacterial infection: Secondary | ICD-10-CM | POA: Diagnosis not present

## 2013-07-29 DIAGNOSIS — J471 Bronchiectasis with (acute) exacerbation: Secondary | ICD-10-CM | POA: Diagnosis not present

## 2013-07-29 MED ORDER — RIFABUTIN 150 MG PO CAPS: 300.0000 mg | ORAL_CAPSULE | Freq: Every day | ORAL | Status: DC

## 2013-07-29 NOTE — Progress Notes (Signed)
Subjective:    Patient ID: Mia Hernandez, female    DOB: 1940/05/14, 73 y.o.   MRN: [id number]  HPI  73 year old lady with past medical history significant for diagnosis of Mycobacterium avium infection of the lungs last May when it was isolated on culture. She started on therapy with clarithromycin rifampin and ethambutol 3 days per week. She had great difficulty tolerating the Biaxin which was she was taking it twice daily doses on the day she was taking it was reduced to once daily dosing on July 01 2012 Apparently at that time there was concern about toxicity although she states that now that she was found to need simply new hearing aids. Initially while on therapy for Mycobacterium avium showed mprovement in her symptoms of chronic daily cough and sputum production. However this spring she is having worsening cough with copious sputum production.  This fall in [place] apparently she had episodes of hemoptysis and was hospitalized due to Hospital in [place] where she was placed in isolation for rule out tuberculosis. Ultimately was realized that she was suffering from Mycobacterium avium infection alone.  . She had repeat sputum taken this spring and it is again grow Mycobacterium avium. The organism was sent for susceptibility testing and showed macrolide sensitivity and in vitro synergy with rifampin and ETH in inhibiting growth in lab.  She has had a recent CT scan of the lungs which has shown an increase in the cavity in the right lower  lung. Her diffuse nodular disease apparently is stable putting a scan done this May 2015.  I had Changed her to  daily therapy with macrolide (Azithromycin) ethambutol and rifampin. Since change she had been initiallycoughing less, she was gaining weight and was  absent fevers.   She saw Dr. Annamaria Boots in November 2014 when she reported 1 month she is coughing up more yellow/liquid phlem since on Rafampin. Chest x-ray was done and showed no changes. Sputum was  sent for routine culture which only isolated normal oral pharyngeal flora. AFB culture is without AFB seen on smear blood cultures in November DID grow M avium again  This fall she developed worsening abdominal pain. LFTs were checked and had actually improved. At her request we has her go back to TIW on Rifampin but continued with daily ETH and Azithro.  She has been on this new regimen x nearly 2 weeks prior to her last visit with me.  Sinse Last seen her she has been continuing to take rifampin 3 times a week with daily azithromycin and ethambutol being taken 4 times a week. She has been correspond with me via e-mail through the Epic system.  She is noticed some increased coughing the last few days with some blood-tinged sputum but this is now resolved. She also for some reason has noticed worsening coughing when she takes her rifampin she is adamant as is not nausea or GI abuse but rather actual coughing but she claims is due to the rifampin.  She is without fevers or weight loss.   Currently has been taking the azithro 500mg  daily, ETH 700mg  TU, TH< SA< SU  Rifampin MWF        Review of Systems  Constitutional: Negative for diaphoresis, activity change, appetite change, fatigue and unexpected weight change.  HENT: Negative for congestion, sinus pressure, sneezing and trouble swallowing.   Eyes: Negative for photophobia and visual disturbance.  Respiratory: Positive for cough. Negative for chest tightness, shortness of breath and stridor.   Cardiovascular: Negative  for palpitations and leg swelling.  Gastrointestinal: Positive for nausea. Negative for vomiting, abdominal pain, diarrhea, constipation, blood in stool, abdominal distention and anal bleeding.  Genitourinary: Negative for dysuria, hematuria, flank pain and difficulty urinating.  Musculoskeletal: Negative for arthralgias, back pain, gait problem and joint swelling.  Skin: Negative for color change, pallor and wound.   Neurological: Negative for dizziness, tremors, weakness and light-headedness.  Hematological: Negative for adenopathy. Does not bruise/bleed easily.  Psychiatric/Behavioral: Negative for behavioral problems, sleep disturbance, dysphoric mood, decreased concentration and agitation.       Objective:   Physical Exam  Constitutional: She is oriented to person, place, and time. No distress.  HENT:  Head: Normocephalic and atraumatic.  Mouth/Throat: Oropharynx is clear and moist. No oropharyngeal exudate.  Eyes: Conjunctivae and EOM are normal. No scleral icterus.  Neck: Normal range of motion. Neck supple. No JVD present.  Cardiovascular: Normal rate, regular rhythm and normal heart sounds.  Exam reveals no gallop and no friction rub.   No murmur heard. Pulmonary/Chest: No respiratory distress. She has wheezes. She has no rales. She exhibits no tenderness.  Prolonged expiratory phase  Abdominal: She exhibits no distension and no mass. There is no tenderness. There is no rebound and no guarding.  Musculoskeletal: She exhibits no edema and no tenderness.  Lymphadenopathy:    She has no cervical adenopathy.  Neurological: She is alert and oriented to person, place, and time. She exhibits normal muscle tone. Coordination normal.  Skin: Skin is warm and dry. She is not diaphoretic. No erythema. No pallor.  Psychiatric: She has a normal mood and affect. Her behavior is normal. Judgment and thought content normal.          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  We will proceed with her son visiting rifabutin 300 mg taken once a day 3 times a week and then she can try to see if she can tolerate this drug daily.   continue azithromycin 500 mg daily  Ethambutol 700 mg daily is currently being taken 4  days a week. She can try to escalate this to every day once she has gone through her extremities with rifabutin dosing  RTC in one month   I spent greater than 25  minutes with the patient including greater than 50% of time in face to face counsel of the patient and in coordination of their care.   #2 Bronchiectasis: due to #1 with cavity: there is risk of fungal infection prickly for aspergilloma in this cavity to be need to be watched carefully. --Need to continue to have efforts made to maximize expectoration of sputum and "pulmonary toilet"

## 2013-07-29 NOTE — Patient Instructions (Signed)
So lets try the following  Subtitute Rifabutin for Rifampin and take the Rifabutin tWO c 150mg  = 300mg  DAILY THREE TIMES a week  IF you can tolerate this try to increase this to daily  If you cannot go back then to Three times weekly   THEn after we have figured out what dose of rifabutin you can tolerate  Try to increase the ethambutol at current dose to every day

## 2013-08-31 ENCOUNTER — Ambulatory Visit (INDEPENDENT_AMBULATORY_CARE_PROVIDER_SITE_OTHER): Payer: Medicare Other | Admitting: Infectious Disease

## 2013-08-31 ENCOUNTER — Encounter: Payer: Self-pay | Admitting: Infectious Disease

## 2013-08-31 VITALS — BP 186/91 | HR 74 | Temp 97.7°F | Wt 103.8 lb

## 2013-08-31 DIAGNOSIS — H547 Unspecified visual loss: Secondary | ICD-10-CM | POA: Diagnosis not present

## 2013-08-31 DIAGNOSIS — J479 Bronchiectasis, uncomplicated: Secondary | ICD-10-CM | POA: Diagnosis not present

## 2013-08-31 DIAGNOSIS — T366X5A Adverse effect of rifampicins, initial encounter: Secondary | ICD-10-CM

## 2013-08-31 DIAGNOSIS — A319 Mycobacterial infection, unspecified: Secondary | ICD-10-CM | POA: Diagnosis not present

## 2013-08-31 DIAGNOSIS — A310 Pulmonary mycobacterial infection: Secondary | ICD-10-CM | POA: Diagnosis not present

## 2013-08-31 DIAGNOSIS — H209 Unspecified iridocyclitis: Secondary | ICD-10-CM

## 2013-08-31 LAB — CBC WITH DIFFERENTIAL/PLATELET
Basophils Absolute: 0.1 10*3/uL (ref 0.0–0.1)
Basophils Relative: 1 % (ref 0–1)
EOS PCT: 7 % — AB (ref 0–5)
Eosinophils Absolute: 0.5 10*3/uL (ref 0.0–0.7)
HEMATOCRIT: 42.2 % (ref 36.0–46.0)
Hemoglobin: 14.5 g/dL (ref 12.0–15.0)
LYMPHS ABS: 2.5 10*3/uL (ref 0.7–4.0)
Lymphocytes Relative: 39 % (ref 12–46)
MCH: 31.6 pg (ref 26.0–34.0)
MCHC: 34.4 g/dL (ref 30.0–36.0)
MCV: 91.9 fL (ref 78.0–100.0)
MONO ABS: 0.7 10*3/uL (ref 0.1–1.0)
MONOS PCT: 11 % (ref 3–12)
Neutro Abs: 2.7 10*3/uL (ref 1.7–7.7)
Neutrophils Relative %: 42 % — ABNORMAL LOW (ref 43–77)
Platelets: 322 10*3/uL (ref 150–400)
RBC: 4.59 MIL/uL (ref 3.87–5.11)
RDW: 13.6 % (ref 11.5–15.5)
WBC: 6.5 10*3/uL (ref 4.0–10.5)

## 2013-08-31 LAB — COMPLETE METABOLIC PANEL WITH GFR
ALT: 26 U/L (ref 0–35)
AST: 37 U/L (ref 0–37)
Albumin: 4.7 g/dL (ref 3.5–5.2)
Alkaline Phosphatase: 73 U/L (ref 39–117)
BUN: 10 mg/dL (ref 6–23)
CALCIUM: 9.7 mg/dL (ref 8.4–10.5)
CHLORIDE: 94 meq/L — AB (ref 96–112)
CO2: 31 meq/L (ref 19–32)
CREATININE: 0.58 mg/dL (ref 0.50–1.10)
GLUCOSE: 90 mg/dL (ref 70–99)
Potassium: 4.5 mEq/L (ref 3.5–5.3)
Sodium: 133 mEq/L — ABNORMAL LOW (ref 135–145)
Total Bilirubin: 0.9 mg/dL (ref 0.2–1.2)
Total Protein: 7.4 g/dL (ref 6.0–8.3)

## 2013-08-31 MED ORDER — AZITHROMYCIN 500 MG PO TABS: 500.0000 mg | ORAL_TABLET | Freq: Every day | ORAL | Status: DC

## 2013-08-31 MED ORDER — RIFAMPIN 300 MG PO CAPS: 600.0000 mg | ORAL_CAPSULE | ORAL | Status: DC

## 2013-08-31 MED ORDER — ETHAMBUTOL HCL 400 MG PO TABS: 400.0000 mg | ORAL_TABLET | Freq: Every day | ORAL | Status: DC

## 2013-08-31 MED ORDER — ETHAMBUTOL HCL 100 MG PO TABS: 300.0000 mg | ORAL_TABLET | Freq: Every day | ORAL | Status: DC

## 2013-08-31 NOTE — Progress Notes (Signed)
Subjective:    Patient ID: Mia Hernandez, female    DOB: [birth year], 73 y.o.   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 She started on therapy with clarithromycin rifampin and ethambutol 3 days per week. She had great difficulty tolerating the Biaxin which was she was taking it twice daily doses on the day she was taking it was reduced to once daily dosing on July 01 2012 Apparently at that time there was concern about toxicity although she states that now that she was found to need simply new hearing aids. Initially while on therapy for Mycobacterium avium showed mprovement in her symptoms of chronic daily cough and sputum production. However this spring she is having worsening cough with copious sputum production.  This fall in [place] apparently she had episodes of hemoptysis and was hospitalized due to Hospital in [place] where she was placed in isolation for rule out tuberculosis. Ultimately was realized that she was suffering from Mycobacterium avium infection alone.  . She had repeat sputum taken this spring and it is again grow Mycobacterium avium. The organism was sent for susceptibility testing and showed macrolide sensitivity and in vitro synergy with rifampin and ETH in inhibiting growth in lab.  She has had a recent CT scan of the lungs which has shown an increase in the cavity in the right lower  lung. Her diffuse nodular disease apparently is stable putting a scan done this May 2015.  I had Changed her to  daily therapy with macrolide (Azithromycin) ethambutol and rifampin. Since change she had been initiallycoughing less, she was gaining weight and was  absent fevers.   She saw Dr. Annamaria Boots in November 2014 when she reported 1 month she is coughing up more yellow/liquid phlem since on Rafampin. Chest x-ray was done and showed no changes. Sputum was  sent for routine culture which only isolated normal oral pharyngeal flora. AFB culture is without AFB seen on smear blood cultures in November DID grow M avium again   I had tried to get her on daily RIFABUTIN with continue azithro and ETH but she did not tolerate at all with severe anorexia, and apparent visual problems, stating that "I could not see for several hours."  She went back to Rifampin TIW,  azithro 500mg  daily, ETH 700mg  TU, TH< SA< SU  She states she continues to cough quite a bit every day but no fevers and she has regained weight she lost due to rifabutin.        Review of Systems  Constitutional: Negative for diaphoresis, activity change, appetite change, fatigue and unexpected weight change.  HENT: Negative for congestion, sinus pressure, sneezing and trouble swallowing.   Eyes: Negative for photophobia and visual disturbance.  Respiratory: Positive for cough. Negative for chest tightness, shortness of breath and stridor.   Cardiovascular: Negative for palpitations and leg swelling.  Gastrointestinal: Positive for nausea. Negative for vomiting, abdominal pain, diarrhea, constipation, blood in stool, abdominal distention and anal bleeding.  Genitourinary: Negative for dysuria, hematuria, flank pain and difficulty urinating.  Musculoskeletal: Negative for arthralgias, back pain, gait problem and joint swelling.  Skin: Negative for color change, pallor and wound.  Neurological: Negative for dizziness, tremors, weakness and light-headedness.  Hematological: Negative for adenopathy. Does not bruise/bleed easily.  Psychiatric/Behavioral: Negative for behavioral problems, sleep disturbance, dysphoric mood, decreased concentration and agitation.  Objective:   Physical Exam  Constitutional: She is oriented to person, place, and time. No distress.  HENT:  Head: Normocephalic and atraumatic.  Mouth/Throat: Oropharynx is clear and moist. No oropharyngeal exudate.   Eyes: Conjunctivae and EOM are normal. No scleral icterus.  Neck: Normal range of motion. Neck supple. No JVD present.  Cardiovascular: Normal rate, regular rhythm and normal heart sounds.  Exam reveals no gallop and no friction rub.   No murmur heard. Pulmonary/Chest: No respiratory distress. She has wheezes. She has no rales. She exhibits no tenderness.  Prolonged expiratory phase  Abdominal: She exhibits no distension and no mass. There is no tenderness. There is no rebound and no guarding.  Musculoskeletal: She exhibits no edema and no tenderness.  Lymphadenopathy:    She has no cervical adenopathy.  Neurological: She is alert and oriented to person, place, and time. She exhibits normal muscle tone. Coordination normal.  Skin: Skin is warm and dry. She is not diaphoretic. No erythema. No pallor.  Psychiatric: She has a normal mood and affect. Her behavior is normal. Judgment and thought content normal.          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    rifampin three times a week  RTC in 4 months   I spent greater than 25  minutes with the patient including greater than 50% of time in face to face counsel of the patient and in coordination of their care.   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  #3 ? Rifabutin induced uveitis?: visual symptoms and other systemic symptoms have resolved. Will update her allergies. Would be good for her to have optho eval.

## 2013-09-01 NOTE — Progress Notes (Signed)
Phone call to the pt to discussed Dr. Derek Mound recommendation for opthalmologic evaluation.  Pt verbalized understanding.  She will call Dr. Kathrin Penner at [facility] for an appointment.

## 2013-09-01 NOTE — Progress Notes (Signed)
Called Mrs Nitta.  She is going to call Dr. Elder Negus office for an appointment for evaluation.  Lorne Skeens, RN

## 2013-09-02 DIAGNOSIS — H5000 Unspecified esotropia: Secondary | ICD-10-CM | POA: Diagnosis not present

## 2013-09-02 DIAGNOSIS — T887XXA Unspecified adverse effect of drug or medicament, initial encounter: Secondary | ICD-10-CM | POA: Diagnosis not present

## 2013-09-02 DIAGNOSIS — H259 Unspecified age-related cataract: Secondary | ICD-10-CM | POA: Diagnosis not present

## 2013-09-02 DIAGNOSIS — H2640 Unspecified secondary cataract: Secondary | ICD-10-CM | POA: Diagnosis not present

## 2013-09-03 ENCOUNTER — Encounter: Payer: Self-pay | Admitting: Infectious Disease

## 2013-09-03 DIAGNOSIS — Z85828 Personal history of other malignant neoplasm of skin: Secondary | ICD-10-CM | POA: Diagnosis not present

## 2013-09-03 DIAGNOSIS — L821 Other seborrheic keratosis: Secondary | ICD-10-CM | POA: Diagnosis not present

## 2013-09-03 DIAGNOSIS — L723 Sebaceous cyst: Secondary | ICD-10-CM | POA: Diagnosis not present

## 2013-09-03 DIAGNOSIS — L988 Other specified disorders of the skin and subcutaneous tissue: Secondary | ICD-10-CM | POA: Diagnosis not present

## 2013-10-09 DIAGNOSIS — H906 Mixed conductive and sensorineural hearing loss, bilateral: Secondary | ICD-10-CM | POA: Diagnosis not present

## 2013-10-15 DIAGNOSIS — H26492 Other secondary cataract, left eye: Secondary | ICD-10-CM | POA: Diagnosis not present

## 2013-10-15 DIAGNOSIS — H2640 Unspecified secondary cataract: Secondary | ICD-10-CM | POA: Diagnosis not present

## 2013-10-22 ENCOUNTER — Ambulatory Visit (INDEPENDENT_AMBULATORY_CARE_PROVIDER_SITE_OTHER): Payer: Medicare Other | Admitting: Cardiovascular Disease

## 2013-10-22 ENCOUNTER — Encounter: Payer: Self-pay | Admitting: Cardiovascular Disease

## 2013-10-22 VITALS — BP 144/90 | HR 77 | Resp 16 | Ht 60.0 in | Wt 105.8 lb

## 2013-10-22 DIAGNOSIS — I10 Essential (primary) hypertension: Secondary | ICD-10-CM

## 2013-10-22 DIAGNOSIS — Z79899 Other long term (current) drug therapy: Secondary | ICD-10-CM | POA: Diagnosis not present

## 2013-10-22 DIAGNOSIS — I309 Acute pericarditis, unspecified: Secondary | ICD-10-CM | POA: Diagnosis not present

## 2013-10-22 DIAGNOSIS — R0602 Shortness of breath: Secondary | ICD-10-CM | POA: Diagnosis not present

## 2013-10-22 DIAGNOSIS — R9431 Abnormal electrocardiogram [ECG] [EKG]: Secondary | ICD-10-CM | POA: Insufficient documentation

## 2013-10-22 DIAGNOSIS — R002 Palpitations: Secondary | ICD-10-CM | POA: Diagnosis not present

## 2013-10-22 LAB — COMPREHENSIVE METABOLIC PANEL
ALBUMIN: 4.7 g/dL (ref 3.5–5.2)
ALT: 21 U/L (ref 0–35)
AST: 29 U/L (ref 0–37)
Alkaline Phosphatase: 66 U/L (ref 39–117)
BILIRUBIN TOTAL: 0.5 mg/dL (ref 0.2–1.2)
BUN: 8 mg/dL (ref 6–23)
CO2: 28 mEq/L (ref 19–32)
Calcium: 9.7 mg/dL (ref 8.4–10.5)
Chloride: 95 mEq/L — ABNORMAL LOW (ref 96–112)
Creat: 0.6 mg/dL (ref 0.50–1.10)
GLUCOSE: 88 mg/dL (ref 70–99)
POTASSIUM: 3.9 meq/L (ref 3.5–5.3)
SODIUM: 134 meq/L — AB (ref 135–145)
Total Protein: 7.2 g/dL (ref 6.0–8.3)

## 2013-10-22 LAB — MAGNESIUM: Magnesium: 2.1 mg/dL (ref 1.5–2.5)

## 2013-10-22 NOTE — Assessment & Plan Note (Signed)
Think her blood pressure control is satisfactory. She clearly has situational exacerbation of her high blood pressure at times.

## 2013-10-22 NOTE — Assessment & Plan Note (Signed)
The ECG changes are quite dramatic, in stark contrast to the absence of any changes in her symptoms status. She does not have exertional chest discomfort or pleuritic symptoms.  It is possible that the changes are related to acute inflammatory pericarditis, even in the absence of symptoms. This could be a parapneumonic pericarditis in the setting of MAI infection or even direct spread of the infection to the pericardial space. I have recommended an echocardiogram.  In the absence of echo findings to confirm pericarditis, I think we'll have to evaluate her for coronary artery disease, even in the absence of angina. She will need a LexiScan Myoview if the echo is nondiagnostic.

## 2013-10-22 NOTE — Patient Instructions (Signed)
Your physician recommends that you return for lab work in: [place] has requested that you have an echocardiogram. Echocardiography is a painless test that uses sound waves to create images of your heart. It provides your doctor with information about the size and shape of your heart and how well your heart's chambers and valves are working. This procedure takes approximately one hour. There are no restrictions for this procedure.  Dr. Sallyanne Kuster recommends that you schedule a follow-up appointment in: 4 WEEKS.

## 2013-10-22 NOTE — Progress Notes (Signed)
Patient ID: Mia Hernandez, female   DOB: 06-20-1940, 73 y.o.   MRN: [id number]      Reason for office visit HTN  Mia Hernandez is here to followup on systemic hypertension and has been checking her blood pressure at home intermittently. Her blood pressure is always in the desirable range (120-130/70-80) at home and is only borderline elevated today.  She continues to struggle with her Mycobacterium avium infection with a right cavitary lung lesion. She is poorly tolerant of the prescribed antibiotics.  A routine followup electrocardiogram performed today and markedly changed from previous tracings. Has sinus rhythm normal QRS morphology but marked symmetrical deep T-wave inversion in leads V3-V6 and in the inferior leads. The QT interval is mildly prolonged (QTC 473 ms. There is no significant ST segment deviation on today's tracing. She denies worsening dyspnea and does not have chest discomfort (either pleuritic or anginal). She has not had fever chills or palpitations or syncope.    Allergies  Allergen Reactions  . Latex Rash  . Rifabutin Other (See Comments)    Loss of vision and anorexia  . Aspirin   . Betadine [Povidone Iodine]   . Codeine   . Iodinated Diagnostic Agents   . Morphine And Related   . Penicillins   . Prednisone     "out of mind" state  . Sulfa Antibiotics     Current Outpatient Prescriptions  Medication Sig Dispense Refill  . amLODipine (NORVASC) 5 MG tablet Take 1 tablet (5 mg total) by mouth daily.  90 tablet  3  . azithromycin (ZITHROMAX) 500 MG tablet Take 1 tablet (500 mg total) by mouth daily.  270 tablet  4  . ethambutol (MYAMBUTOL) 100 MG tablet Take 3 tablets (300 mg total) by mouth daily.  270 tablet  4  . rifampin (RIFADIN) 300 MG capsule Take 2 capsules (600 mg total) by mouth 3 (three) times a week. Monday, Wednesday and Friday  180 capsule  4   No current facility-administered medications for this visit.    Past Medical History  Diagnosis Date    . Osteoporosis   . ALLERGIC RHINITIS   . Chronic back pain   . Mycobacterium avium-intracellulare infection   . Bronchiectasis     Past Surgical History  Procedure Laterality Date  . Cholecystectomy    . Vaginal hysterectomy    . Laminectomy    . Breast biopsy      Family History  Problem Relation Age of Onset  . Breast cancer Mother   . Stroke Father   . Pulmonary embolism Brother     History   Social History  . Marital Status: Married    Spouse Name: N/A    Number of Children: N/A  . Years of Education: N/A   Occupational History  . retired    Social History Main Topics  . Smoking status: Never Smoker   . Smokeless tobacco: Not on file  . Alcohol Use: 0.6 oz/week    1 Glasses of wine per week     Comment: at dinner  . Drug Use: No  . Sexual Activity: Not on file   Other Topics Concern  . Not on file   Social History Narrative  . No narrative on file    Review of systems: The patient specifically denies any chest pain at rest or with exertion, dyspnea at rest or with exertion, orthopnea, paroxysmal nocturnal dyspnea, syncope, palpitations, focal neurological deficits, intermittent claudication, lower extremity edema, unexplained weight gain, hemoptysis or  wheezing. She has a cough that has been on for many months  The patient also denies abdominal pain, nausea, vomiting, dysphagia, diarrhea, constipation, polyuria, polydipsia, dysuria, hematuria, frequency, urgency, abnormal bleeding or bruising, fever, chills, unexpected weight changes, mood swings, change in skin or hair texture, change in voice quality, auditory or visual problems, allergic reactions or rashes, new musculoskeletal complaints other than usual "aches and pains".   PHYSICAL EXAM BP 144/90  Pulse 77  Resp 16  Ht 5' (1.524 m)  Wt 47.991 kg (105 lb 12.8 oz)  BMI 20.66 kg/m2 General: Alert, oriented x3, no distress  Head: no evidence of trauma, PERRL, EOMI, no exophtalmos or lid lag, no  myxedema, no xanthelasma; normal ears, nose and oropharynx  Neck: normal jugular venous pulsations and no hepatojugular reflux; brisk carotid pulses without delay and no carotid bruits  Chest: In the right lower lung there are a few rhonchi and wheezes, otherwise clear to auscultation, no signs of consolidation by percussion or palpation, normal fremitus, symmetrical and full respiratory excursions  Cardiovascular: normal position and quality of the apical impulse, regular rhythm, normal first and second heart sounds, no murmurs, rubs or gallops  Abdomen: no tenderness or distention, no masses by palpation, no abnormal pulsatility or arterial bruits, normal bowel sounds, no hepatosplenomegaly  Extremities: no clubbing, cyanosis or edema; 2+ radial, ulnar and brachial pulses bilaterally; 2+ right femoral, posterior tibial and dorsalis pedis pulses; 2+ left femoral, posterior tibial and dorsalis pedis pulses; no subclavian or femoral bruits  Neurological: grossly nonfocal   EKG:  sinus rhythm, severe symmetrical T wave inversion in all the inferior leads and V3-V6, markedly changed from previous tracings, QTC 473 ms   BMET    Component Value Date/Time   NA 133* 08/31/2013 1011   K 4.5 08/31/2013 1011   CL 94* 08/31/2013 1011   CO2 31 08/31/2013 1011   GLUCOSE 90 08/31/2013 1011   BUN 10 08/31/2013 1011   CREATININE 0.58 08/31/2013 1011   CREATININE 0.7 02/16/2013 1522   CALCIUM 9.7 08/31/2013 1011   GFRNONAA >89 08/31/2013 1011   GFRAA >89 08/31/2013 1011     ASSESSMENT AND PLAN Abnormal resting ECG findings The ECG changes are quite dramatic, in stark contrast to the absence of any changes in her symptoms status. She does not have exertional chest discomfort or pleuritic symptoms.  It is possible that the changes are related to acute inflammatory pericarditis, even in the absence of symptoms. This could be a parapneumonic pericarditis in the setting of MAI infection or even direct spread of the  infection to the pericardial space. I have recommended an echocardiogram.  In the absence of echo findings to confirm pericarditis, I think we'll have to evaluate her for coronary artery disease, even in the absence of angina. She will need a LexiScan Myoview if the echo is nondiagnostic.  Hypertension Think her blood pressure control is satisfactory. She clearly has situational exacerbation of her high blood pressure at times.   Orders Placed This Encounter  Procedures  . Comprehensive metabolic panel  . Magnesium  . EKG 12-Lead  . 2D Echocardiogram without contrast   No orders of the defined types were placed in this encounter.    Holli Humbles, MD, [place] [phone] office [phone] pager

## 2013-10-22 NOTE — Progress Notes (Signed)
Mihai do you think we need a TEE here?

## 2013-10-26 ENCOUNTER — Encounter: Payer: Self-pay | Admitting: Infectious Disease

## 2013-10-27 ENCOUNTER — Ambulatory Visit (HOSPITAL_COMMUNITY)
Admission: RE | Admit: 2013-10-27 | Discharge: 2013-10-27 | Disposition: A | Discharge status: Home / Self Care | Payer: Medicare Other | Source: Ambulatory Visit | Attending: Cardiovascular Disease | Admitting: Cardiovascular Disease

## 2013-10-27 DIAGNOSIS — I10 Essential (primary) hypertension: Secondary | ICD-10-CM | POA: Insufficient documentation

## 2013-10-27 DIAGNOSIS — I369 Nonrheumatic tricuspid valve disorder, unspecified: Secondary | ICD-10-CM

## 2013-10-27 DIAGNOSIS — I359 Nonrheumatic aortic valve disorder, unspecified: Secondary | ICD-10-CM | POA: Insufficient documentation

## 2013-10-27 DIAGNOSIS — I309 Acute pericarditis, unspecified: Secondary | ICD-10-CM

## 2013-10-27 NOTE — Progress Notes (Signed)
2D Echo Performed 10/27/2013    Marygrace Drought, RCS

## 2013-10-29 DIAGNOSIS — Z23 Encounter for immunization: Secondary | ICD-10-CM | POA: Diagnosis not present

## 2013-11-11 ENCOUNTER — Ambulatory Visit (INDEPENDENT_AMBULATORY_CARE_PROVIDER_SITE_OTHER): Payer: Medicare Other | Admitting: Cardiovascular Disease

## 2013-11-11 ENCOUNTER — Encounter: Payer: Self-pay | Admitting: Cardiovascular Disease

## 2013-11-11 VITALS — BP 148/91 | HR 83 | Resp 16 | Ht 59.0 in | Wt 105.4 lb

## 2013-11-11 DIAGNOSIS — I519 Heart disease, unspecified: Secondary | ICD-10-CM

## 2013-11-11 DIAGNOSIS — R9431 Abnormal electrocardiogram [ECG] [EKG]: Secondary | ICD-10-CM | POA: Diagnosis not present

## 2013-11-11 DIAGNOSIS — I10 Essential (primary) hypertension: Secondary | ICD-10-CM | POA: Diagnosis not present

## 2013-11-11 NOTE — Patient Instructions (Signed)
Your physician has requested that you have en exercise stress myoview. For further information please visit [email or web address]. Please follow instruction sheet, as given.  Dr. Sallyanne Kuster recommends that you schedule a follow-up appointment in: 3 months.

## 2013-11-11 NOTE — Progress Notes (Signed)
Patient ID: Mia Hernandez, female   DOB: 03-08-1940, 73 y.o.   MRN: [id number]     Reason for office visit Follow-up abnormal electrocardiogram  Mrs. Sheriff presents in follow-up after undergoing an echo. This was prompted by strikingly abnormal repolarization changes on her electrocardiogram, in the absence of any cardiac complaints. Her only coronary risk factor is hypertension which until recently has been well compensated. She takes triple antibiotic therapy for MAI infection, but that been no recent changes in the regimen.  Electrocardiogram today continues to show deep symmetrical inverted T waves in all the inferior leads as well as V3-V6. The QTC is mildly prolonged at 455 ms.  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  As before she denies problems with angina pectoris or exertional dyspnea, but continues to be tortured by incessant cough.  Review of her chest CT shows no evidence of calcium deposits in any of the coronary arteries again demonstrates the cavitary lesions especially in her right lower lung. Electrolytes are normal   Allergies  Allergen Reactions  . Latex Rash  . Rifabutin Other (See Comments)    Loss of vision and anorexia  . Aspirin   . Betadine [Povidone Iodine]   . Codeine   . Iodinated Diagnostic Agents   . Morphine And Related   . Penicillins   . Prednisone     "out of mind" state  . Sulfa Antibiotics     Current Outpatient Prescriptions  Medication Sig Dispense Refill  . amLODipine (NORVASC) 5 MG tablet Take 1 tablet (5 mg total) by mouth daily. 90 tablet 3  . azithromycin (ZITHROMAX) 500 MG tablet Take 1 tablet (500 mg total) by mouth daily. 270 tablet 4  . ethambutol (MYAMBUTOL) 100 MG tablet Take 3 tablets (300 mg total) by mouth daily. (Patient taking differently: Take 400 mg by mouth 4 (four) times a week. Tues, thurs, Saturday,  and sunday) 270 tablet 4  . rifampin (RIFADIN) 300 MG capsule Take 2 capsules (600 mg total) by mouth 3 (three) times a week. Monday, Wednesday and Friday 180 capsule 4   No current facility-administered medications for this visit.    Past Medical History  Diagnosis Date  . Osteoporosis   . ALLERGIC RHINITIS   . Chronic back pain   . Mycobacterium avium-intracellulare infection   . Bronchiectasis     Past Surgical History  Procedure Laterality Date  . Cholecystectomy    . Vaginal hysterectomy    . Laminectomy    . Breast biopsy      Family History  Problem Relation Age of Onset  . Breast cancer Mother   . Stroke Father   . Pulmonary embolism Brother     History   Social History  . Marital Status: Married    Spouse Name: N/A    Number of Children: N/A  . Years of Education: N/A   Occupational History  . retired    Social History Main Topics  . Smoking status: Never Smoker   . Smokeless tobacco: Not on file  . Alcohol Use: 0.6 oz/week    1 Glasses of wine per week     Comment: at dinner  . Drug Use: No  . Sexual Activity: Not on file   Other Topics Concern  . Not on file   Social History Narrative    Review of systems: The patient specifically denies any chest pain at  rest or with exertion, dyspnea at rest or with exertion, orthopnea, paroxysmal nocturnal dyspnea, syncope, palpitations, focal neurological deficits, intermittent claudication, lower extremity edema, unexplained weight gain, hemoptysis or wheezing. She has a cough that has been on for many months  The patient also denies abdominal pain, nausea, vomiting, dysphagia, diarrhea, constipation, polyuria, polydipsia, dysuria, hematuria, frequency, urgency, abnormal bleeding or bruising, fever, chills, unexpected weight changes, mood swings, change in skin or hair texture, change in voice quality, auditory or visual problems, allergic reactions or rashes, new musculoskeletal complaints other than usual  "aches and pains".   PHYSICAL EXAM BP 148/91 mmHg  Pulse 83  Resp 16  Ht 4\' 11"  (1.499 m)  Wt 105 lb 6.4 oz (47.809 kg)  BMI 21.28 kg/m2 General: Alert, oriented x3, no distress  Head: no evidence of trauma, PERRL, EOMI, no exophtalmos or lid lag, no myxedema, no xanthelasma; normal ears, nose and oropharynx  Neck: normal jugular venous pulsations and no hepatojugular reflux; brisk carotid pulses without delay and no carotid bruits  Chest: In the right lower lung there are a few rhonchi and wheezes, otherwise clear to auscultation, no signs of consolidation by percussion or palpation, normal fremitus, symmetrical and full respiratory excursions  Cardiovascular: normal position and quality of the apical impulse, regular rhythm, normal first and second heart sounds, no murmurs, rubs or gallops  Abdomen: no tenderness or distention, no masses by palpation, no abnormal pulsatility or arterial bruits, normal bowel sounds, no hepatosplenomegaly  Extremities: no clubbing, cyanosis or edema; 2+ radial, ulnar and brachial pulses bilaterally; 2+ right femoral, posterior tibial and dorsalis pedis pulses; 2+ left femoral, posterior tibial and dorsalis pedis pulses; no subclavian or femoral bruits  Neurological: grossly nonfocal   EKG: described above  Lipid Panel  No results found for: CHOL, TRIG, HDL, CHOLHDL, VLDL, LDLCALC, LDLDIRECT  BMET    Component Value Date/Time   NA 134* 10/22/2013 1403   K 3.9 10/22/2013 1403   CL 95* 10/22/2013 1403   CO2 28 10/22/2013 1403   GLUCOSE 88 10/22/2013 1403   BUN 8 10/22/2013 1403   CREATININE 0.60 10/22/2013 1403   CREATININE 0.7 02/16/2013 1522   CALCIUM 9.7 10/22/2013 1403   GFRNONAA >89 08/31/2013 1011   GFRAA >89 08/31/2013 1011     ASSESSMENT AND PLAN  Mrs. Orris has an electrocardiogram with very worrisome changes and now her echo shows mild depression overall ventricular systolic function. The pattern does not suggest coronary  abnormalities, since it is a global. She has a very limited risk factor profile but is a septuagenarian, exposing her to the risk of CAD. Nevertheless, no calcium is seen in the coronary distribution on her chest CT.  I offered a variety of approaches for further evaluation. I don't think we can ignore the abnormalities on her ECG. It is possible that she has low-level myocarditis, without pericarditis or symptomatic heart failure. On the other hand she could have coronary disease with multivessel involvement.  She would prefer to avoid invasive evaluation and I recommended a minimum of a stress nuclear perfusion study. If this is normal, a cardiac MRI may be a good next step. If the stress test is abnormal, she should have coronary angiography.  Orders Placed This Encounter  Procedures  . Myocardial Perfusion Imaging   No orders of the defined types were placed in this encounter.    Holli Humbles, MD, [place] [phone] office [phone] pager

## 2013-11-13 ENCOUNTER — Telehealth (HOSPITAL_COMMUNITY): Payer: Self-pay

## 2013-11-13 ENCOUNTER — Encounter: Payer: Self-pay | Admitting: Cardiovascular Disease

## 2013-11-13 DIAGNOSIS — H9190 Unspecified hearing loss, unspecified ear: Secondary | ICD-10-CM | POA: Diagnosis not present

## 2013-11-13 DIAGNOSIS — H6983 Other specified disorders of Eustachian tube, bilateral: Secondary | ICD-10-CM | POA: Diagnosis not present

## 2013-11-13 NOTE — Telephone Encounter (Signed)
Encounter complete. 

## 2013-11-13 NOTE — Addendum Note (Signed)
Addended by: Vear Clock on: 11/13/2013 03:26 PM   Modules accepted: Orders

## 2013-11-17 MED ORDER — VALSARTAN 160 MG PO TABS: 160.0000 mg | ORAL_TABLET | Freq: Every day | ORAL | Status: DC

## 2013-11-17 NOTE — Telephone Encounter (Signed)
[place] sent in for Diovan 160mg  daily

## 2013-11-18 ENCOUNTER — Ambulatory Visit (HOSPITAL_COMMUNITY)
Admission: RE | Admit: 2013-11-18 | Discharge: 2013-11-18 | Disposition: A | Discharge status: Home / Self Care | Payer: Medicare Other | Source: Ambulatory Visit | Attending: Cardiovascular Disease | Admitting: Cardiovascular Disease

## 2013-11-18 DIAGNOSIS — G459 Transient cerebral ischemic attack, unspecified: Secondary | ICD-10-CM | POA: Insufficient documentation

## 2013-11-18 DIAGNOSIS — I10 Essential (primary) hypertension: Secondary | ICD-10-CM | POA: Insufficient documentation

## 2013-11-18 DIAGNOSIS — R9431 Abnormal electrocardiogram [ECG] [EKG]: Secondary | ICD-10-CM | POA: Insufficient documentation

## 2013-11-18 MED ORDER — TECHNETIUM TC 99M SESTAMIBI GENERIC - CARDIOLITE
30.9000 | Freq: Once | INTRAVENOUS | Status: AC | PRN
  Administered 2013-11-18: 30.9 via INTRAVENOUS

## 2013-11-18 MED ORDER — REGADENOSON 0.4 MG/5ML IV SOLN
0.4000 mg | Freq: Once | INTRAVENOUS | Status: AC
Start: 2013-11-18 — End: 2013-11-18
  Administered 2013-11-18: 0.4 mg via INTRAVENOUS

## 2013-11-18 MED ORDER — AMINOPHYLLINE 25 MG/ML IV SOLN
75.0000 mg | Freq: Once | INTRAVENOUS | Status: AC
  Administered 2013-11-18: 75 mg via INTRAVENOUS

## 2013-11-18 MED ORDER — TECHNETIUM TC 99M SESTAMIBI GENERIC - CARDIOLITE
10.4000 | Freq: Once | INTRAVENOUS | Status: AC | PRN
  Administered 2013-11-18: 10 via INTRAVENOUS

## 2013-11-18 NOTE — Procedures (Addendum)
[facility] [address] [place] [address] [phone]  Cardiology Nuclear Med Study  Mia Hernandez is a 73 y.o. 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   Nuclear Pre-Procedure Caffeine/Decaff Intake:  7:00pm NPO After: 5:00am   IV Site: R Forearm  IV 0.9% NS with Angio Cath:  22g  Chest Size (in):  n/a IV Started by: Rolene Course, RN  Height: 4\' 11"  (1.499 m)  Cup Size: A  BMI:  Body mass index is 21.2 kg/(m^2). Weight:  105 lb (47.628 kg)   Tech Comments:  Pt. Had Diastolic HTN; 128-786, changes to Lexiscan. tm    Nuclear Med Study 1 or 2 day study: 1 day  Stress Test Type:  [place]  Order Authorizing Provider:  Sanda Klein, MD   Resting Radionuclide: Technetium 75m Sestamibi  Resting Radionuclide Dose: 10.4 mCi   Stress Radionuclide:  Technetium 67m Sestamibi  Stress Radionuclide Dose: 30.9 mCi           Stress Protocol Rest HR: 84 Stress HR: 106  Rest BP: 135/110 Stress BP: 193/105  Exercise Time (min): n/a METS: n/a   Predicted Max HR: 146 bpm % Max HR: 72.6 bpm Rate Pressure Product: 20458  Dose of Adenosine (mg):  n/a Dose of Lexiscan: 0.4 mg  Dose of Atropine (mg): n/a Dose of Dobutamine: n/a mcg/kg/min (at max HR)  Stress Test Technologist: Leane Para, CCT Nuclear Technologist: Imagene Riches, CNMT   Rest Procedure:  Myocardial perfusion imaging was performed at rest 45 minutes following the intravenous administration of Technetium 79m Sestamibi. Stress Procedure:  The patient received IV Lexiscan 0.4 mg over 15-seconds.  Technetium 33m  Sestamibi injected IV at 30-seconds. Patient experienced SOB and 75 mg Aminophylline IV was administered. There were no significant changes with Lexiscan.  Quantitative spect images were obtained after a 45 minute delay.  Transient Ischemic Dilatation (Normal <1.22):  1.13  QGS EDV:  91 ml QGS ESV:  42 ml LV Ejection Fraction: 53%     Rest ECG: NSR-LVH  Stress ECG: No significant change from baseline ECG  QPS Raw Data Images:  Normal; no motion artifact; normal heart/lung ratio. Stress Images:  Normal homogeneous uptake in all areas of the myocardium. Rest Images:  Normal homogeneous uptake in all areas of the myocardium. Subtraction (SDS):  No evidence of ischemia.  Impression Exercise Capacity:  Lexiscan with no exercise. BP Response:  Hypertensive blood pressure response. Clinical Symptoms:  No significant symptoms noted. ECG Impression:  No significant ST segment change suggestive of ischemia. Comparison with Prior Nuclear Study: No previous nuclear study performed  Overall Impression:  Normal stress nuclear study.  LV Wall Motion:  NL LV Function; NL Wall Motion   Lorretta Harp, MD  11/18/2013 1:17 PM

## 2013-11-23 ENCOUNTER — Other Ambulatory Visit: Payer: Self-pay

## 2013-11-23 ENCOUNTER — Encounter: Payer: Self-pay | Admitting: Cardiovascular Disease

## 2013-11-23 ENCOUNTER — Telehealth: Payer: Self-pay | Admitting: *Deleted

## 2013-11-23 DIAGNOSIS — I514 Myocarditis, unspecified: Secondary | ICD-10-CM

## 2013-11-23 MED ORDER — VALSARTAN 160 MG PO TABS: 160.0000 mg | ORAL_TABLET | Freq: Every day | ORAL | Status: DC

## 2013-11-23 NOTE — Telephone Encounter (Signed)
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

## 2013-11-23 NOTE — Telephone Encounter (Signed)
Rx sent to pharmacy   

## 2013-11-23 NOTE — Telephone Encounter (Signed)
Patient notified to stop the rifampin, azithromycin and ethambutol.  Order place for MRI w/contrast and sent to [facility] for scheduling.  Patient notified and voiced understanding.

## 2013-11-23 NOTE — Telephone Encounter (Signed)
Patient notified Dr. Loletha Grayer has order a Cardiac MRI w/gadolinium contrast.  Order placed and sent to scheduling.  Patient informed she will get a call about date and time.  She does not need to be off meds a specific length of time.  Message sent to [facility].

## 2013-11-23 NOTE — Telephone Encounter (Signed)
-----   Message from Sanda Klein, MD sent at 11/23/2013  8:42 AM EST ----- With gadolinium contrast, does not need to be off meds MCr ----- Message -----    From: Tressa Busman, CMA    Sent: 11/23/2013   7:38 AM      To: Sanda Klein, MD  WITH OR WITHOUT CONTRAST?  Does she need to be off the meds for a certain length of time before the MRI is scheduled? 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

## 2013-11-24 ENCOUNTER — Encounter: Payer: Self-pay | Admitting: Cardiovascular Disease

## 2013-11-26 ENCOUNTER — Ambulatory Visit (HOSPITAL_COMMUNITY)
Admission: RE | Admit: 2013-11-26 | Discharge: 2013-11-26 | Disposition: A | Discharge status: Home / Self Care | Payer: Medicare Other | Source: Ambulatory Visit | Attending: Cardiovascular Disease | Admitting: Cardiovascular Disease

## 2013-11-26 DIAGNOSIS — I514 Myocarditis, unspecified: Secondary | ICD-10-CM

## 2013-11-26 DIAGNOSIS — I4581 Long QT syndrome: Secondary | ICD-10-CM | POA: Diagnosis present

## 2013-11-26 DIAGNOSIS — R931 Abnormal findings on diagnostic imaging of heart and coronary circulation: Secondary | ICD-10-CM | POA: Insufficient documentation

## 2013-11-26 DIAGNOSIS — I517 Cardiomegaly: Secondary | ICD-10-CM | POA: Diagnosis not present

## 2013-11-26 DIAGNOSIS — R9431 Abnormal electrocardiogram [ECG] [EKG]: Secondary | ICD-10-CM | POA: Diagnosis present

## 2013-11-26 LAB — CREATININE, SERUM: CREATININE: 0.5 mg/dL (ref 0.50–1.10)

## 2013-11-26 MED ORDER — GADOBENATE DIMEGLUMINE 529 MG/ML IV SOLN
15.0000 mL | Freq: Once | INTRAVENOUS | Status: AC
  Administered 2013-11-26: 15 mL via INTRAVENOUS

## 2013-11-30 ENCOUNTER — Encounter: Payer: Self-pay | Admitting: Cardiovascular Disease

## 2013-12-01 ENCOUNTER — Telehealth: Payer: Self-pay | Admitting: *Deleted

## 2013-12-01 ENCOUNTER — Telehealth: Payer: Self-pay | Admitting: Cardiovascular Disease

## 2013-12-01 NOTE — Telephone Encounter (Signed)
Response back to patient to call and schedule an EKG 4 weeks after stopping meds for Dr. Lurline Del review.

## 2013-12-02 NOTE — Telephone Encounter (Signed)
Close encounter 

## 2013-12-08 ENCOUNTER — Other Ambulatory Visit (HOSPITAL_COMMUNITY): Payer: Medicare Other

## 2013-12-19 ENCOUNTER — Encounter: Payer: Self-pay | Admitting: Cardiovascular Disease

## 2013-12-19 ENCOUNTER — Encounter: Payer: Self-pay | Admitting: Infectious Disease

## 2013-12-21 ENCOUNTER — Telehealth: Payer: Self-pay | Admitting: *Deleted

## 2013-12-21 ENCOUNTER — Ambulatory Visit: Payer: Medicare Other | Admitting: Internal Medicine

## 2013-12-21 MED ORDER — VALSARTAN 320 MG PO TABS: 320.0000 mg | ORAL_TABLET | Freq: Every day | ORAL | Status: DC

## 2013-12-21 NOTE — Telephone Encounter (Signed)
Patient notified to increase valsartan to 320mg  daily. Patient voiced understanding.  Rx sent to [facility]. Scheduled for repeat EKG Thursday. Wants to know what the next step is.  Told patient Dr. Loletha Grayer will read the EKG and advise at that time.

## 2013-12-21 NOTE — Telephone Encounter (Signed)
Please see the following mychart email request: Could you write a letter "to whom it may concern" stating my illness is mycobacterium avium and is non-contagious. It is helpful for me to take with me when traveling. Otherwise, I have been placed in isolation pending diagnosis. I can pick up on the 16th, which is my next appointment. Thanks.

## 2013-12-21 NOTE — Telephone Encounter (Signed)
-----   Message from Sanda Klein, MD sent at 12/21/2013  1:18 PM EST ----- Please increase valsartan to 320 mg daily. She sent me a message about her blood pressure still being high through my chart. I managed to lose it somehow.

## 2013-12-23 ENCOUNTER — Encounter: Payer: Self-pay | Admitting: Infectious Disease

## 2013-12-23 ENCOUNTER — Ambulatory Visit (INDEPENDENT_AMBULATORY_CARE_PROVIDER_SITE_OTHER): Payer: Medicare Other | Admitting: Infectious Disease

## 2013-12-23 VITALS — BP 189/108 | HR 83 | Temp 97.7°F | Wt 102.0 lb

## 2013-12-23 DIAGNOSIS — R9431 Abnormal electrocardiogram [ECG] [EKG]: Secondary | ICD-10-CM

## 2013-12-23 DIAGNOSIS — A310 Pulmonary mycobacterial infection: Secondary | ICD-10-CM | POA: Diagnosis not present

## 2013-12-23 DIAGNOSIS — J471 Bronchiectasis with (acute) exacerbation: Secondary | ICD-10-CM

## 2013-12-23 NOTE — Progress Notes (Signed)
Subjective:    Patient ID: Mia Hernandez, female    DOB: 1940-06-17, 73 y.o.   MRN: [id number]  HPI  73 year old lady with past medical history significant for diagnosis of Mycobacterium avium infection of the lungs last May when it was isolated on culture. She started on therapy with clarithromycin rifampin and ethambutol 3 days per week. She had great difficulty tolerating the Biaxin which was she was taking it twice daily doses on the day she was taking it was reduced to once daily dosing on July 01 2012 Apparently at that time there was concern about toxicity although she states that now that she was found to need simply new hearing aids. Initially while on therapy for Mycobacterium avium showed mprovement in her symptoms of chronic daily cough and sputum production. However this spring she is having worsening cough with copious sputum production.  Last fall in [place] 2013 apparently she had episodes of hemoptysis and was hospitalized due to Hospital in [place] where she was placed in isolation for rule out tuberculosis. Ultimately was realized that she was suffering from Mycobacterium avium infection alone.  . She had repeat sputum taken Spring of 2014 and it is again grow Mycobacterium avium. The organism was sent for susceptibility testing and showed macrolide sensitivity and in vitro synergy with rifampin and ETH in inhibiting growth in lab.  She has had a recent CT scan of the lungs which has shown an increase in the cavity in the right lower  lung. Her diffuse nodular disease apparently is stable putting a scan done this May 2015.  I had Changed her to  daily therapy with macrolide (Azithromycin) ethambutol and rifampin. Since change she had been initiallycoughing less, she was gaining weight and was  absent fevers.   She saw Dr. Annamaria Boots in November 2014 when she reported 1 month she is coughing up more yellow/liquid phlem since on Rafampin. Chest x-ray was done and showed no changes. Sputum  was sent for routine culture which only isolated normal oral pharyngeal flora. AFB culture is without AFB seen on smear blood cultures in November DID grow M avium again   I had tried to get her on daily RIFABUTIN with continue azithro and ETH but she did not tolerate at all with severe anorexia, and apparent visual problems, stating that "I could not see for several hours."  She went back to Rifampin TIW,  azithro 500mg  daily, ETH 700mg  TU, TH< SA< SU  Since I last saw her she saw her Cardiologist Dr. Sallyanne Kuster  who saw her in October and found NEW  deep symmetrical inverted T waves in all the inferior leads as well as V3-V6. The QTC prolongation on EKG that persistd on followup EKG. he was concern for possible antibiotic induced myocarditis and the patient stopped her anti-Mycobacterium drugs and had MRI of her heart performed which was normal echocardiogram was also normal. She is scheduled to have a repeat EKG done in the next few days.  Since coming off her anti-Mycobacterium avium drugs she has continued to cough on a daily basis but has not had any hemoptysis has not had weight loss but instead had weight gain and generally feels systemically better. He requests that I write a letter for her in case she is traveling again has hemoptysis and someone then decides to try to place her in isolation based on a positive AFB smear. I wrote a letter documenting the fact that she is chronically infected with Mycobacterium avium and that this  is not a contagious organism.        Review of Systems  Constitutional: Negative for diaphoresis, activity change, appetite change, fatigue and unexpected weight change.  HENT: Negative for congestion, sinus pressure, sneezing and trouble swallowing.   Eyes: Negative for photophobia and visual disturbance.  Respiratory: Positive for cough. Negative for chest tightness, shortness of breath and stridor.   Cardiovascular: Negative for palpitations and leg  swelling.  Gastrointestinal: Positive for nausea. Negative for vomiting, abdominal pain, diarrhea, constipation, blood in stool, abdominal distention and anal bleeding.  Genitourinary: Negative for dysuria, hematuria, flank pain and difficulty urinating.  Musculoskeletal: Negative for back pain, joint swelling, arthralgias and gait problem.  Skin: Negative for color change, pallor and wound.  Neurological: Negative for dizziness, tremors, weakness and light-headedness.  Hematological: Negative for adenopathy. Does not bruise/bleed easily.  Psychiatric/Behavioral: Negative for behavioral problems, sleep disturbance, dysphoric mood, decreased concentration and agitation.       Objective:   Physical Exam  Constitutional: She is oriented to person, place, and time. She appears well-developed and well-nourished. No distress.  HENT:  Head: Normocephalic and atraumatic.  Mouth/Throat: No oropharyngeal exudate.  Eyes: Conjunctivae and EOM are normal. No scleral icterus.  Neck: Normal range of motion. Neck supple.  Cardiovascular: Normal rate, regular rhythm and normal heart sounds.  Exam reveals no gallop and no friction rub.   No murmur heard. Pulmonary/Chest: Effort normal. No respiratory distress. She has no wheezes.  Crackles at bases, prolonged expiratory phase  Abdominal: She exhibits no distension.  Musculoskeletal: She exhibits no edema or tenderness.  Neurological: She is alert and oriented to person, place, and time. She exhibits normal muscle tone. Coordination normal.  Skin: Skin is warm and dry. No rash noted. She is not diaphoretic. No erythema. No pallor.  Psychiatric: She has a normal mood and affect. Her behavior is normal. Judgment and thought content normal.          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 MRI did not show the myocarditis. Her repeat EKG is pending. I wonder she suffered a pericarditis acutely was unrelated to her meds and that she had QT prolongation that was related to her medications. At present she is doing fine without medications I will see what her repeat EKG shows and schedule her in follow-up in 3 months time. I suspect she will again have worsening symptoms including hemoptysis and 79 be treated again unfortunately we do need a macrolide antibiotic for going to try to treat this chronic infection. I spent greater than 25 minutes with the patient including greater than 50% of time in face to face counsel of the patient and in coordination of their care.  #2 Abnormal EKG : could this have been a pericarditis due to virus rather than medication effect. It persisted for 2 visits with Dr.  Sallyanne Kuster . She will have followup EKG in next few days and followup visit in February with [facility]    #3 Bronchiectasis: due to #1 with cavity: there is risk of fungal infection esp aspergilloma in this cavity to be need to be watched carefully.

## 2013-12-24 ENCOUNTER — Ambulatory Visit (INDEPENDENT_AMBULATORY_CARE_PROVIDER_SITE_OTHER): Payer: Medicare Other | Admitting: *Deleted

## 2013-12-24 VITALS — HR 85

## 2013-12-24 DIAGNOSIS — I514 Myocarditis, unspecified: Secondary | ICD-10-CM | POA: Diagnosis not present

## 2013-12-24 NOTE — Patient Instructions (Signed)
Dr. Sallyanne Kuster reviewed your EKG and said it is back to normal!  Please follow up as directed in February.

## 2013-12-25 ENCOUNTER — Encounter: Payer: Self-pay | Admitting: Internal Medicine

## 2014-01-06 ENCOUNTER — Telehealth: Payer: Self-pay | Admitting: *Deleted

## 2014-01-06 ENCOUNTER — Other Ambulatory Visit: Payer: Self-pay | Admitting: Cardiovascular Disease

## 2014-01-06 ENCOUNTER — Encounter: Payer: Self-pay | Admitting: Cardiovascular Disease

## 2014-01-06 MED ORDER — VALSARTAN 160 MG PO TABS: 160.0000 mg | ORAL_TABLET | Freq: Two times a day (BID) | ORAL | Status: DC

## 2014-01-06 NOTE — Telephone Encounter (Signed)
RN notified patient. She verbalized understanding.

## 2014-01-06 NOTE — Telephone Encounter (Signed)
Pt need a new prescription for her Diovan 160 mg #60. Please call to [facility].

## 2014-01-06 NOTE — Telephone Encounter (Signed)
Sent Rx for Diovan 160mg  dose BID as she requested. Will forward to Dr. Sallyanne Kuster to review.  From pt mychart message: "Dr. Loletha Grayer wanted me to try Diovan 160mg  twice daily in lieu of 320mg  once daily. The twice daily approach seems to be working better. Average BP reading for 11 days is 146/79. If Dr. Loletha Grayer agrees, need a new prescription for the twice daily 160mg  tabs and will cancel the 320 prescription with [facility]. Will need 30-day prescription for the twice daily 160mg  tabs sent to [facility] to continue checking the results before ordering a 90-day supply.Dr. Loletha Grayer wanted me to try Diovan 160mg  twice daily in lieu of 320mg  once daily. The twice daily approach seems to be working better. Average BP reading for 11 days is 146/79. If Dr. Loletha Grayer agrees, need a new prescription for the twice daily 160mg  tabs and will cancel the 320 prescription with [facility]. Will need 30-day prescription for the twice daily 160mg  tabs sent to [facility] to continue checking the results before ordering a 90-day supply."

## 2014-01-06 NOTE — Telephone Encounter (Signed)
Called an d spoke to patient. Patient states she and Dr Sallyanne Kuster discuss taking 160 mg twice a day since blood pressure has been elevated in the evenings. She states blood pressure range has been 140's-150's/70's-90's.  Patient has already received valsartan 320 mg from [facility]  RN informed patient to try the valsartan 320 mg daily and stop taking 160 mg twice a day to see if blood pressure improves. Keep a log of blood pressures for 2 weeks and contact office with readings. Patient verbalized understanding.

## 2014-01-06 NOTE — Telephone Encounter (Signed)
Try once daily first, if necessary, OK to break tab in 1/2 and take 160 bid

## 2014-02-12 ENCOUNTER — Encounter: Payer: Self-pay | Admitting: Cardiovascular Disease

## 2014-02-12 ENCOUNTER — Ambulatory Visit (INDEPENDENT_AMBULATORY_CARE_PROVIDER_SITE_OTHER): Payer: Medicare Other | Admitting: Cardiovascular Disease

## 2014-02-12 VITALS — BP 130/88 | HR 84 | Ht 59.0 in | Wt 105.1 lb

## 2014-02-12 DIAGNOSIS — E785 Hyperlipidemia, unspecified: Secondary | ICD-10-CM

## 2014-02-12 MED ORDER — CHLORTHALIDONE 25 MG PO TABS: 12.5000 mg | ORAL_TABLET | ORAL | Status: DC

## 2014-02-12 NOTE — Progress Notes (Signed)
Patient ID: Mia Hernandez, female   DOB: 1940-08-29, 74 y.o.   MRN: [id number]     Reason for office visit Hypertension, hypersensitivity myocarditis  Mia Hernandez feels much better after stopping her antibiotics for Mycobacterium avium intracellulare infection. She has surgery gained back some weight. She continues to have a daily cough but no longer has fatigue and malaise and irritability. She had evidence of myocarditis manifesting with drastic ECG changes, marked QT interval prolongation, mildly decreased left ventricular ejection fraction and MRI changes highly consistent with myocardial inflammation. Her electrocardiogram has returned to normal. She did not have perfusion abnormalities on her nuclear stress test. It is unclear what the etiology of her myocarditis might have been, but the leading suspect is her macrolide antibiotic, azithromycin. QTc is now 458 ms.  She has been keeping a close eye on her blood pressure and this is persistently elevated in the 150-160/80 range.  Allergies  Allergen Reactions  . Latex Rash  . Rifabutin Other (See Comments)    Loss of vision and anorexia  . Aspirin   . Betadine [Povidone Iodine]   . Codeine   . Iodinated Diagnostic Agents   . Morphine And Related   . Penicillins   . Prednisone     "out of mind" state  . Sulfa Antibiotics     Current Outpatient Prescriptions  Medication Sig Dispense Refill  . chlorthalidone (HYGROTON) 25 MG tablet Take 0.5 tablets (12.5 mg total) by mouth 3 (three) times a week. Take 1/2 tablet Monday/Wednesday/Friday 25 tablet 5  . valsartan (DIOVAN) 320 MG tablet Take 320 mg by mouth daily.     No current facility-administered medications for this visit.    Past Medical History  Diagnosis Date  . Osteoporosis   . ALLERGIC RHINITIS   . Chronic back pain   . Mycobacterium avium-intracellulare infection   . Bronchiectasis     Past Surgical History  Procedure Laterality Date  . Cholecystectomy    . Vaginal  hysterectomy    . Laminectomy    . Breast biopsy      Family History  Problem Relation Age of Onset  . Breast cancer Mother   . Stroke Father   . Pulmonary embolism Brother     History   Social History  . Marital Status: Married    Spouse Name: N/A    Number of Children: N/A  . Years of Education: N/A   Occupational History  . retired    Social History Main Topics  . Smoking status: Never Smoker   . Smokeless tobacco: Not on file  . Alcohol Use: 0.6 oz/week    1 Glasses of wine per week     Comment: at dinner  . Drug Use: No  . Sexual Activity: Not on file   Other Topics Concern  . Not on file   Social History Narrative    Review of systems: The patient specifically denies any chest pain at rest or with exertion, dyspnea at rest or with exertion, orthopnea, paroxysmal nocturnal dyspnea, syncope, palpitations, focal neurological deficits, intermittent claudication, lower extremity edema, unexplained weight gain, hemoptysis or wheezing.  The patient also denies abdominal pain, nausea, vomiting, dysphagia, diarrhea, constipation, polyuria, polydipsia, dysuria, hematuria, frequency, urgency, abnormal bleeding or bruising, fever, chills, unexpected weight changes, mood swings, change in skin or hair texture, change in voice quality, auditory or visual problems, allergic reactions or rashes, new musculoskeletal complaints other than usual "aches and pains".   PHYSICAL EXAM BP 130/88 mmHg  Pulse  84  Ht 4\' 11"  (1.499 m)  Wt 47.673 kg (105 lb 1.6 oz)  BMI 21.22 kg/m2 General: Alert, oriented x3, no distress  Head: no evidence of trauma, PERRL, EOMI, no exophtalmos or lid lag, no myxedema, no xanthelasma; normal ears, nose and oropharynx  Neck: normal jugular venous pulsations and no hepatojugular reflux; brisk carotid pulses without delay and no carotid bruits  Chest: In the right lower lung there are a few rhonchi and wheezes, otherwise clear to auscultation, no signs  of consolidation by percussion or palpation, normal fremitus, symmetrical and full respiratory excursions  Cardiovascular: normal position and quality of the apical impulse, regular rhythm, normal first and second heart sounds, no murmurs, rubs or gallops  Abdomen: no tenderness or distention, no masses by palpation, no abnormal pulsatility or arterial bruits, normal bowel sounds, no hepatosplenomegaly  Extremities: no clubbing, cyanosis or edema; 2+ radial, ulnar and brachial pulses bilaterally; 2+ right femoral, posterior tibial and dorsalis pedis pulses; 2+ left femoral, posterior tibial and dorsalis pedis pulses; no subclavian or femoral bruits  Neurological: grossly nonfocal  EKG: NSR, normal repolarization pattern, borderline QTC 458 ms  Lipid Panel  No results found for: CHOL, TRIG, HDL, CHOLHDL, VLDL, LDLCALC, LDLDIRECT  BMET    Component Value Date/Time   NA 134* 10/22/2013 1403   K 3.9 10/22/2013 1403   CL 95* 10/22/2013 1403   CO2 28 10/22/2013 1403   GLUCOSE 88 10/22/2013 1403   BUN 8 10/22/2013 1403   CREATININE 0.50 11/26/2013 0845   CREATININE 0.60 10/22/2013 1403   CALCIUM 9.7 10/22/2013 1403   GFRNONAA >90 11/26/2013 0845   GFRNONAA >89 08/31/2013 1011   GFRAA >90 11/26/2013 0845   GFRAA >89 08/31/2013 1011     ASSESSMENT AND PLAN  Acute myocarditis (presumed hypersensitivity to macrolide antibiotic), resolved Currently not receiving any antibiotic therapy for her MAI infection. If treatment becomes necessary in the future, it'll be hard to find an appropriate regimen that does not contain a macrolide.  Systemic hypertension Add chlorthalidone 12.5 mg 3 days a week to her current valsartan prescription. After a couple of weeks she will send some blood pressure recordings via mychart.  Orders Placed This Encounter  Procedures  . Lipid panel   Meds ordered this encounter  Medications  . valsartan (DIOVAN) 320 MG tablet    Sig: Take 320 mg by mouth daily.   Marland Kitchen DISCONTD: chlorthalidone (HYGROTON) 25 MG tablet    Sig: Take 12.5 mg by mouth 3 (three) times a week. Take 1/2 tablet Monday/Wednesday/Friday  . chlorthalidone (HYGROTON) 25 MG tablet    Sig: Take 0.5 tablets (12.5 mg total) by mouth 3 (three) times a week. Take 1/2 tablet Monday/Wednesday/Friday    Dispense:  25 tablet    Refill:  5    Casee Knepp  Sanda Klein, MD, [facility] [phone] office [phone] pager

## 2014-02-12 NOTE — Patient Instructions (Signed)
START Chlorthalidone 25mg  - take 1/2 tablet on Mondays/Wednesdays/Fridays.  Your physician recommends that you return for lab work in: FASTING cholesterol panel at [facility].  Dr. Sallyanne Kuster recommends that you schedule a follow-up appointment in: 4 months.

## 2014-02-16 NOTE — Addendum Note (Signed)
Addended by: Chauncy Lean. on: 02/16/2014 10:25 AM   Modules accepted: Orders

## 2014-02-22 ENCOUNTER — Ambulatory Visit: Payer: Medicare Other | Admitting: Internal Medicine

## 2014-03-01 ENCOUNTER — Other Ambulatory Visit: Payer: Self-pay

## 2014-03-01 MED ORDER — CHLORTHALIDONE 25 MG PO TABS: 12.5000 mg | ORAL_TABLET | ORAL | Status: DC

## 2014-03-01 NOTE — Telephone Encounter (Signed)
Rx(s) sent to pharmacy electronically.  

## 2014-03-04 ENCOUNTER — Ambulatory Visit (INDEPENDENT_AMBULATORY_CARE_PROVIDER_SITE_OTHER): Payer: Medicare Other | Admitting: Internal Medicine

## 2014-03-04 ENCOUNTER — Encounter: Payer: Self-pay | Admitting: Internal Medicine

## 2014-03-04 ENCOUNTER — Ambulatory Visit (INDEPENDENT_AMBULATORY_CARE_PROVIDER_SITE_OTHER)
Admission: RE | Admit: 2014-03-04 | Discharge: 2014-03-04 | Disposition: A | Discharge status: Home / Self Care | Payer: Medicare Other | Source: Ambulatory Visit | Attending: Internal Medicine | Admitting: Internal Medicine

## 2014-03-04 VITALS — BP 122/78 | HR 77 | Ht 60.0 in | Wt 105.8 lb

## 2014-03-04 DIAGNOSIS — J42 Unspecified chronic bronchitis: Secondary | ICD-10-CM

## 2014-03-04 DIAGNOSIS — A310 Pulmonary mycobacterial infection: Secondary | ICD-10-CM | POA: Diagnosis not present

## 2014-03-04 DIAGNOSIS — J479 Bronchiectasis, uncomplicated: Secondary | ICD-10-CM

## 2014-03-04 DIAGNOSIS — J984 Other disorders of lung: Secondary | ICD-10-CM | POA: Diagnosis not present

## 2014-03-04 MED ORDER — BENZONATATE 200 MG PO CAPS: 200.0000 mg | ORAL_CAPSULE | Freq: Three times a day (TID) | ORAL | Status: DC | PRN

## 2014-03-04 NOTE — Assessment & Plan Note (Signed)
Cavity may become complicated by significant bleeding or fungus ball. We will have to deal with it at that point.

## 2014-03-04 NOTE — Progress Notes (Signed)
04/18/11- 74 yo F never smoker referred by Dr Reynaldo Minium because of cough. Persistent cough since January. In late February she was treated with 7 days of Levaquin after chest x-ray showed some density. Cough is better in warm moist air, worse when out in pollen or when active and better at night. Cough improved with Levaquin but persists. Notices postnasal drip. Saw ENT/Dr.Wolicki who didn't think there was enough of sinus problem to be of concern. Cough is dry. She denies enlarged glands, fever, sweat or weight loss. Denies history of lung or nasal condition. She has been told that she has some wheeze in the right lung, going back years. She insists she has always been healthy and athletic, never aware of shortness of breath or breathing problems. Denies GERD. Remote history of walking pneumonia. As a small child mother had tuberculosis. No PPD in many years but not remembered  as positive. Chest x-ray 02/26/2011-right-sided infiltrate recommend followup. Chest x-ray 03/07/2011-COPD, prominent linear markings in the right middle lobe may be due to atelectasis or scarring but pneumonia can't be excluded.  05/11/11-  4 yoF never smoker followed for cough     PCP Dr Reynaldo Minium      Husband here Denies seasonal rhinitis complaints. Still some dry cough with a sense there is mucus in her throat. Dr Erik Obey ENT gave Z-Pak then Cleocin now for 30 days. She denies shortness [place] chest 04/18/11- reviewed w/ them-- IMPRESSION:  Diffuse peribronchovascular nodularity, right greater than left,  with areas of bronchiectasis and mild cavitation. 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 Follow-up of the  dominant cavitary lesion in the right lower lobe is recommended in  4-6 weeks to ensure stability, as a developing abscess cannot be  excluded. Similarly, although felt to be less likely, malignancy  cannot be definitively excluded and continued consideration on   follow-up exams is warranted.  Original Report Authenticated By: Luretha Rued, M.D.  PFT 05/10/11- mild obstructive airways disease, small airways, insignificant response to bronchodilator. air trapping, hyperinflation, normal diffusion FEV1 1.81/111%, FEV1/FVC 0.71, FEF 25-75% 1.19/59%. TLC 134%, RV 174%, DLCO 115%.  07/02/11- 70 yoF never smoker followed for cough, +MAIC/ triple therapy    PCP Dr Reynaldo Minium      Husband here Slow 1 little streak of blood in mucus as a single event. Mostly thin clear mucus now. Denies fever, night sweats, nodes. Of her 3 drugs, Biaxin bothers her the most and she thinks it is affecting her hearing. She wears hearing aids already, fitted by the audiologist at [facility] Dr. Kathrin Penner has already checked her eyes on ethambutol. Sputum Cx - 06/07/11- + M.avium CXR 06/12/11- reviewed IMPRESSION:  Stable radiographic appearance of the chest since 03/07/2011.  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 Continued follow-up of the right lower lobe cavitary  lesion is recommended.  Original Report Authenticated By: Randall An, M.D.   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 Feeling well. Not coughing at all-cough was her original complaint. Tolerating medications well as we reduced Biaxin from 1500 mg 3 days per week. That was because of concern about her hearing.  She suspects now there was no connection. She denies fever, sweat, chills. Scant sputum is clear. CXR 08/31/11- reviewed with her IMPRESSION:  1. Persistent cavitary lesion in the right lower lobe. Favor  Mycobacterium avium intracellular. Recommend ongoing radiographic  follow-up.  2. Similar appearance of interstitial thickening and right middle  lobe bronchiectasis.  Original Report Authenticated By: Areta Haber, M.D.   12/21/11- 18 yoF never smoker followed for cough, +MAIC/ triple therapy    PCP Dr Reynaldo Minium  FOLLOWS FOR: recently in hospital-[place] 12/05/11 for hemoptysis; denies any wheezing, SOB, cough, or congestion CT 12/05/11- [place]- RLL cavitary lesion, sub-centimeter satellite foci in RML, RLL, with COPD levaquin was added to her triple therapy ( which began 06/18/11). No prior or subsequent heme.  CXR 08/31/11   IMPRESSION:  1. Persistent cavitary lesion in the right lower lobe. Favor  Mycobacterium avium intracellular. Recommend ongoing radiographic  follow-up.  2. Similar appearance of interstitial thickening and right middle  lobe bronchiectasis.  Original Report Authenticated By: Areta Haber, M.D.   03/20/12- 81 yoF never smoker followed for cough, +MAIC/ triple therapy began 06/18/11    PCP Dr Reynaldo Minium   Husband here  FOLLOWS FOR: feels like she can breathe deeper than before, Had coughing (productive-in mornings;yellow/green to white in color) since last bleeding episode she had(3 times); also head cold recently.Continues triple therapy for [facility]. Third and latest hemoptysis was only slight- in January. Took extra biaxin then. Most days morning cough, scant, clear mucus. Just got over a cold 1 week ago.   05/22/12- 71 yoF never smoker followed for cough, +MAIC/ triple therapy began 06/18/11    PCP Dr Reynaldo Minium   Husband here FOLLOWS FOR: Morning times-has cough spell and gets little aount of phlegm-lite in color. Sputum Cx AFB again POS [facility]  03/21/12 She has felt somewhat better in the last few weeks with more energy but still productive cough with scant yellow sputum. No sweats or fevers, adenopathy, or recent hemoptysis CXR 04/28/12 IMPRESSION:  1. No significant change in cavitary lung lesion within the right  lower lobe.  2. Chronic lung abnormality is stable compared with previous exam.  Original Report Authenticated By: Kerby Moors, M.D.  11/24/12- 76 yoF never smoker followed for cough, +MAIC/ triple therapy began 06/18/11    PCP Dr Reynaldo Minium   Husband here Bronchiectasis. Pt reports breathing has unchanged. for about 1 month she is coughing up more yellow/liquid phlegm since on Rafampin She declines flu vaccine and pneumonia vaccine, reporting large local reaction in the past. Discussed. Denies fever or night sweats. Increased cough productive yellow sputum. No blood. Sputum culture for AFB negative in 6 weeks, obtain 07/25/2012 Has been seeing Infectious Disease:  --azithromycin 500 mg daily  --Ethambutol 700 mg daily  --Rifampin 600 mg daily CT 05/22/12 IMPRESSION:  1. Diffuse peribronchovascular nodularity, right greater than  left, with areas of bronchiectasis and cavitation. 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  2. The dominant cavitary lesion in the right lower lobe has  increased in size from previous exam.  Original Report Authenticated By: Kerby Moors, M.D.  02/16/13- 71 yoF never smoker followed for cough, +MAIC/ triple therapy began 06/18/11    PCP Dr Reynaldo Minium   Husband here FOLLOWS FOR: has been doing well since last visit; husband states she continues to cough alot. AFB cx 11/26/12 again POS MAIC Followed by [facility]/ Dr Wendie Agreste and has been on daily triple therapy since June. We reviewed her x-rays and liver function tests, noting minor elevation of transaminase. She is breathing well, denies fever or sweats" functioning fine". She complains of very productive cough,  especially in the mornings and after sitting. Rare specks of blood. Sputum is usually watery clear with slight yellow. CXR 11/26/12 IMPRESSION:  Stable appearance of cavitary lesion seen in right lower lobe  compared to prior exam. Stable interstitial densities are noted  throughout both lungs most consistent with scarring or fibrosis.  Electronically Signed  By: Sabino Dick M.D.  On: 11/26/2012 12:37  05/25/13- 43 yoF never smoker followed for cough, +MAIC/ triple therapy began 06/18/11    PCP Dr Reynaldo Minium   Husband here                     Lucianne Lei Dam/ Infectious Disease FOLLOWS FOR: Cough-productive-clear to yellow at times, Denies any SOB or wheezing. She had asked Dr Tommy Medal about stopping her meds.   06/30/13- 72 yoF never smoker followed for cough, +MAIC/ triple therapy began 06/18/11    PCP Dr Reynaldo Minium   Husband here                     Lucianne Lei Dam/ Infectious Disease FOLLOWS FOR: review most recent CT with patient; pt states she is breathing well since last visit and continues to abx's. Now almost 2 years on triple therapy. Some cough daily, productive, white. 2 blood spots one month ago. Abdominal bloating better. Thinks sometimes her speech is more hesitant on the days she takes Ritalin. Last positive AFB sputum 11/26/2012. CT 05/29/13- images reviewed with them today IMPRESSION:  Re- demonstrated diffuse peribronchovascular nodularity within the  right greater than left lungs with associated bronchiectasis and  cavitation, particularly within the right lower lobe. 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  Prominent cavitary lesion within the right lower lobe is grossly  stable from recent prior however has increased in size from more  remote prior exams.  Electronically Signed:  By: Lovey Newcomer M.D.  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 Due to  zith? FOLLOWS FOR: No longer on meds for [facility] due to heart issues. Pt states she is coughing now. Next ID appt is in March. Cough dry or scant white. No blood, fever or night sweats for now. She has been told by ID with Pharmacist, that there is NO antibiotic regimen available that doesn't include a macrolide.   ROS-see HPI Constitutional:   No-   weight loss, night sweats, fevers, chills, fatigue, lassitude. HEENT:   No-  headaches, difficulty swallowing, tooth/dental problems, sore throat,       No-  sneezing, itching, ear ache, nasal congestion, post nasal drip,  CV:  No-   chest pain, orthopnea, PND, swelling in lower extremities, anasarca, dizziness, palpitations Resp: No-   shortness of breath with exertion or at rest.              +  productive cough,  little non-productive cough,  No recent coughing up of blood.              No-   change in color of mucus.  No- wheezing.   Skin: No-   rash or lesions. GI:  No-   heartburn, indigestion, abdominal pain, nausea, vomiting,  GU:  MS:  No-   joint pain or swelling.   Neuro-     nothing unusual Psych:  No- change in mood or affect. No depression or anxiety.  No memory loss.  OBJ- Physical Exam  General- Alert, Oriented, Affect-appropriate, Distress- none acute, slender, talkative Skin- rash-none, lesions- none, excoriation- none Lymphadenopathy- none Head- atraumatic            Eyes- Gross vision intact, PERRLA, conjunctivae and secretions clear            Ears- Hearing aides            Nose- Clear, no-Septal dev, mucus, polyps, erosion, perforation             Throat- Mallampati II , mucosa clear , drainage- none, tonsils- atrophic Neck- flexible , trachea midline, no stridor , thyroid nl, carotid no bruit Chest - symmetrical excursion , unlabored           Heart/CV- RRR , no murmur , no gallop  , no rub, nl s1 s2                           - JVD- none , edema- none, stasis changes- none, varices- none           Lung- +few crackles  right chest, + coarse breath sounds without wheeze or rhonchi, cough- none , dullness-none, rub- none           Chest wall-  Abd-  Br/ Gen/ Rectal- Not done, not indicated Extrem- cyanosis- none, clubbing, none, atrophy- none, strength- nl Neuro- grossly intact to observation

## 2014-03-04 NOTE — Assessment & Plan Note (Signed)
She has been told by ID that there is not even an investigational drug regimen without a macrolide. For now she remains off treatment. Problems will come if she has further bleeding.

## 2014-03-04 NOTE — Patient Instructions (Addendum)
Order- CXR    Dx chronic bronchitis, MAIC  Script sent for benzonatate/ Tessalon perles to use for cough as needed  Ok to use throat lozenges, sips of liquids and etc for cough

## 2014-03-08 ENCOUNTER — Telehealth: Payer: Self-pay | Admitting: Internal Medicine

## 2014-03-08 DIAGNOSIS — E785 Hyperlipidemia, unspecified: Secondary | ICD-10-CM | POA: Diagnosis not present

## 2014-03-08 NOTE — Telephone Encounter (Signed)
Notes Recorded by Deneise Lever, MD on 03/04/2014 at 8:05 PM CXR- The cavity in the right lung looks a little bigger compared with the last chest xray. There is still evidence of chronic bronchitis that would be associated with the [facility] infection. We will want to watch this, even if we can't treat the infection for now. -- Pt is aware of CXR results. She had questions about the report. In the report it states that her R middle lobe is almost completely collapsed. Would like CY's recommendations on what to do about this.  CY - please advise.  Thanks.

## 2014-03-08 NOTE — Telephone Encounter (Signed)
The collapse of the right middle lobe is not new, but has gotten somewhat more pronounced. It is usually caused by crowding from lymph nodes. There isn't much we can do about it.  If we want to look at this more closely, the choice is either CT scan with contrast, or a bronchoscopy to look into the airway with a light. Neither would be a treatment, just a way to confirm what is going on.

## 2014-03-08 NOTE — Telephone Encounter (Signed)
Spoke with pt to relay CY's recs.  She doesn't wish to do a bronch or ct at this time.  Nothing further needed.

## 2014-03-09 LAB — LIPID PANEL
CHOLESTEROL: 212 mg/dL — AB (ref 0–200)
HDL: 120 mg/dL (ref >=46)
LDL CALC: 80 mg/dL (ref 0–99)
Total CHOL/HDL Ratio: 1.8 Ratio
Triglycerides: 58 mg/dL (ref <150)
VLDL: 12 mg/dL (ref 0–40)

## 2014-03-24 ENCOUNTER — Encounter: Payer: Self-pay | Admitting: Infectious Disease

## 2014-03-24 ENCOUNTER — Ambulatory Visit (INDEPENDENT_AMBULATORY_CARE_PROVIDER_SITE_OTHER): Payer: Medicare Other | Admitting: Infectious Disease

## 2014-03-24 VITALS — BP 173/98 | HR 84 | Temp 97.9°F | Ht 59.0 in | Wt 105.0 lb

## 2014-03-24 DIAGNOSIS — I309 Acute pericarditis, unspecified: Secondary | ICD-10-CM | POA: Diagnosis not present

## 2014-03-24 DIAGNOSIS — I10 Essential (primary) hypertension: Secondary | ICD-10-CM

## 2014-03-24 DIAGNOSIS — I4581 Long QT syndrome: Secondary | ICD-10-CM | POA: Diagnosis not present

## 2014-03-24 DIAGNOSIS — R9431 Abnormal electrocardiogram [ECG] [EKG]: Secondary | ICD-10-CM | POA: Insufficient documentation

## 2014-03-24 DIAGNOSIS — I514 Myocarditis, unspecified: Secondary | ICD-10-CM

## 2014-03-24 DIAGNOSIS — J479 Bronchiectasis, uncomplicated: Secondary | ICD-10-CM

## 2014-03-24 DIAGNOSIS — A310 Pulmonary mycobacterial infection: Secondary | ICD-10-CM

## 2014-03-24 NOTE — Progress Notes (Signed)
Subjective:    Patient ID: Mia Hernandez, female    DOB: May 17, 1940, 74 y.o.   MRN: [id number]  HPI  74 year old lady with past medical history significant for diagnosis of Mycobacterium avium infection of the lungs last May when it was isolated on culture. She started on therapy with clarithromycin rifampin and ethambutol 3 days per week. She had great difficulty tolerating the Biaxin which was she was taking it twice daily doses on the day she was taking it was reduced to once daily dosing on July 01 2012 Apparently at that time there was concern about toxicity although she states that now that she was found to need simply new hearing aids. Initially while on therapy for Mycobacterium avium showed mprovement in her symptoms of chronic daily cough and sputum production. However this spring she is having worsening cough with copious sputum production.  Last fall in [place] 2013 apparently she had episodes of hemoptysis and was hospitalized due to Hospital in [place] where she was placed in isolation for rule out tuberculosis. Ultimately was realized that she was suffering from Mycobacterium avium infection alone.  . She had repeat sputum taken Spring of 2014 and it is again grow Mycobacterium avium. The organism was sent for susceptibility testing and showed macrolide sensitivity and in vitro synergy with rifampin and ETH in inhibiting growth in lab.  She has had a recent CT scan of the lungs which has shown an increase in the cavity in the right lower  lung. Her diffuse nodular disease apparently is stable putting a scan done this May 2015.  I had Changed her to  daily therapy with macrolide (Azithromycin) ethambutol and rifampin. Since change she had been initiallycoughing less, she was gaining weight and was  absent fevers.   She saw Dr. Annamaria Boots in November 2014 when she reported 1 month she is coughing up more yellow/liquid phlem since on Rafampin. Chest x-ray was done and showed no changes. Sputum  was sent for routine culture which only isolated normal oral pharyngeal flora. AFB culture is without AFB seen on smear blood cultures in November DID grow M avium again   I had tried to get her on daily RIFABUTIN with continue azithro and ETH but she did not tolerate at all with severe anorexia, and apparent visual problems, stating that "I could not see for several hours."  She went back to Rifampin TIW,  azithro 500mg  daily, ETH 700mg  TU, TH< SA< SU  Since I last saw her she saw her Cardiologist Dr. Sallyanne Kuster  who saw her in October and found NEW  deep symmetrical inverted T waves in all the inferior leads as well as V3-V6. The QTC prolongation on EKG that persistd on followup EKG. he was concern for possible antibiotic induced myocarditis and the patient stopped her anti-Mycobacterium drugs and had MRI of her heart performed which was normal echocardiogram was also normal. She is scheduled to have a repeat EKG done in the next few days.  Since coming off her anti-Mycobacterium avium drugs she has continued to cough on a daily basis but has not had any hemoptysis has not had weight loss but instead had weight gain and generally feels systemically better.   Most recently she has had recurrence of cough with phlegm production and saw Dr. Annamaria Boots.  She presents to followup with me.  She still is having daily cough with copious sputum production but no fevers, chills and no weight loss.  After extensive discussion today she preferred to be  OFF meds rather than try to take them again.  CXR done recently shows slight expansion in cavity in RML and significant bronchiectasis.     Review of Systems  Constitutional: Negative for diaphoresis, activity change, appetite change, fatigue and unexpected weight change.  HENT: Negative for congestion, sinus pressure, sneezing and trouble swallowing.   Eyes: Negative for photophobia and visual disturbance.  Respiratory: Positive for cough. Negative for  chest tightness, shortness of breath and stridor.   Cardiovascular: Negative for palpitations and leg swelling.  Gastrointestinal: Negative for vomiting, abdominal pain, diarrhea, constipation, blood in stool, abdominal distention and anal bleeding.  Genitourinary: Negative for dysuria, hematuria, flank pain and difficulty urinating.  Musculoskeletal: Negative for back pain, joint swelling, arthralgias and gait problem.  Skin: Negative for color change, pallor and wound.  Neurological: Negative for dizziness, tremors, weakness and light-headedness.  Hematological: Negative for adenopathy. Does not bruise/bleed easily.  Psychiatric/Behavioral: Negative for behavioral problems, sleep disturbance, dysphoric mood, decreased concentration and agitation.       Objective:   Physical Exam  Constitutional: She is oriented to person, place, and time. She appears well-developed and well-nourished. No distress.  HENT:  Head: Normocephalic and atraumatic.  Mouth/Throat: No oropharyngeal exudate.  Eyes: Conjunctivae and EOM are normal. No scleral icterus.  Neck: Normal range of motion. Neck supple.  Cardiovascular: Normal rate, regular rhythm and normal heart sounds.  Exam reveals no gallop and no friction rub.   No murmur heard. Pulmonary/Chest: Effort normal. No respiratory distress. She has no wheezes.  Crackles at bases, prolonged expiratory phase  Abdominal: She exhibits no distension.  Musculoskeletal: She exhibits no edema or tenderness.  Neurological: She is alert and oriented to person, place, and time. She exhibits normal muscle tone. Coordination normal.  Skin: Skin is warm and dry. No rash noted. She is not diaphoretic. No erythema. No pallor.  Psychiatric: She has a normal mood and affect. Her behavior is normal. Judgment and thought content normal.          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   WE could try to rechallenge her with azithro, Ethambutol and RIFABUTIN (since her M avium was actually R to rifampin)  Or we could consider IV amikacin plus azithromycin and ETH but I dont feel this is worth risk for nephrotoxicity and ototoxicity.  I spent greater than 40 minutes with the patient including greater than 50% of time in face to face counsel of the patient and in coordination of their care.   #2 Abnormal EKG : could this have been a pericarditis due to virus rather than medication effect. It persisted for 2 visits with Dr.  Sallyanne Kuster . She will have followup EKG in next few days and followup visit in February with Croitoru   #3 Bronchiectasis: due to #1 with cavity: there is risk of fungal infection esp aspergilloma in this cavity to be need to be watched carefully.

## 2014-04-05 ENCOUNTER — Other Ambulatory Visit: Payer: Self-pay

## 2014-04-05 DIAGNOSIS — Z1231 Encounter for screening mammogram for malignant neoplasm of breast: Secondary | ICD-10-CM

## 2014-04-12 ENCOUNTER — Encounter: Payer: Self-pay | Admitting: Infectious Disease

## 2014-04-14 ENCOUNTER — Other Ambulatory Visit: Payer: Self-pay | Admitting: Licensed Clinical Social Worker

## 2014-04-14 DIAGNOSIS — A310 Pulmonary mycobacterial infection: Secondary | ICD-10-CM

## 2014-04-15 ENCOUNTER — Telehealth: Payer: Self-pay | Admitting: Cardiovascular Disease

## 2014-04-15 NOTE — Telephone Encounter (Addendum)
Spoke to pt who reports HR elevations over last few days. Typically in 70s, she noted this week in 35s and this AM was 90s-100s.  She thought may have to do w/ BP meds. She reports BPs well controlled (110s-120s/70s), she noted HR elevations over last few days.  She reports recent ongoing illness, cough & congestion. She has been followed recently by pulmonology for this. Has been ongoing. She denies SOB but states "fluid on lungs". No recent weight gain or LE edema noted. Advised to f/u w/ pulmonology - HR being elevated a little not immediately concerning but likely r/t illness. Pt voiced understanding.

## 2014-04-15 NOTE — Telephone Encounter (Signed)
Agree - normal for HR to increase with respratory illness, those HR levels are not dangerous

## 2014-04-15 NOTE — Telephone Encounter (Signed)
Pt says her pulse rate keep increasing,this started in the beginning of April. Please call,very concerned,wonder if she might need to come in.Marland Kitchen

## 2014-04-16 DIAGNOSIS — M5031 Other cervical disc degeneration,  high cervical region: Secondary | ICD-10-CM | POA: Diagnosis not present

## 2014-04-16 DIAGNOSIS — Z681 Body mass index (BMI) 19 or less, adult: Secondary | ICD-10-CM | POA: Diagnosis not present

## 2014-04-16 DIAGNOSIS — M542 Cervicalgia: Secondary | ICD-10-CM | POA: Diagnosis not present

## 2014-04-19 DIAGNOSIS — M4692 Unspecified inflammatory spondylopathy, cervical region: Secondary | ICD-10-CM | POA: Diagnosis not present

## 2014-04-23 ENCOUNTER — Ambulatory Visit: Payer: Medicare Other

## 2014-05-03 ENCOUNTER — Ambulatory Visit (INDEPENDENT_AMBULATORY_CARE_PROVIDER_SITE_OTHER): Payer: Medicare Other | Admitting: Infectious Disease

## 2014-05-03 ENCOUNTER — Ambulatory Visit
Admission: RE | Admit: 2014-05-03 | Discharge: 2014-05-03 | Disposition: A | Discharge status: Home / Self Care | Payer: Medicare Other | Source: Ambulatory Visit | Attending: Infectious Disease | Admitting: Infectious Disease

## 2014-05-03 ENCOUNTER — Encounter: Payer: Self-pay | Admitting: Infectious Disease

## 2014-05-03 VITALS — BP 160/94 | HR 82 | Temp 98.1°F | Wt 103.0 lb

## 2014-05-03 DIAGNOSIS — R042 Hemoptysis: Secondary | ICD-10-CM | POA: Diagnosis not present

## 2014-05-03 DIAGNOSIS — I4581 Long QT syndrome: Secondary | ICD-10-CM

## 2014-05-03 DIAGNOSIS — A310 Pulmonary mycobacterial infection: Secondary | ICD-10-CM

## 2014-05-03 DIAGNOSIS — I309 Acute pericarditis, unspecified: Secondary | ICD-10-CM | POA: Diagnosis not present

## 2014-05-03 DIAGNOSIS — J471 Bronchiectasis with (acute) exacerbation: Secondary | ICD-10-CM | POA: Diagnosis not present

## 2014-05-03 DIAGNOSIS — I514 Myocarditis, unspecified: Secondary | ICD-10-CM

## 2014-05-03 DIAGNOSIS — R911 Solitary pulmonary nodule: Secondary | ICD-10-CM | POA: Diagnosis not present

## 2014-05-03 DIAGNOSIS — T50905A Adverse effect of unspecified drugs, medicaments and biological substances, initial encounter: Secondary | ICD-10-CM

## 2014-05-03 DIAGNOSIS — R9431 Abnormal electrocardiogram [ECG] [EKG]: Secondary | ICD-10-CM

## 2014-05-03 LAB — BASIC METABOLIC PANEL WITH GFR
BUN: 11 mg/dL (ref 6–23)
CHLORIDE: 91 meq/L — AB (ref 96–112)
CO2: 28 meq/L (ref 19–32)
CREATININE: 0.58 mg/dL (ref 0.50–1.10)
Calcium: 9.4 mg/dL (ref 8.4–10.5)
Glucose, Bld: 87 mg/dL (ref 70–99)
Potassium: 4.1 mEq/L (ref 3.5–5.3)
SODIUM: 130 meq/L — AB (ref 135–145)

## 2014-05-03 LAB — CBC WITH DIFFERENTIAL/PLATELET
BASOS PCT: 0 % (ref 0–1)
Basophils Absolute: 0 10*3/uL (ref 0.0–0.1)
EOS ABS: 0.2 10*3/uL (ref 0.0–0.7)
Eosinophils Relative: 2 % (ref 0–5)
HCT: 40 % (ref 36.0–46.0)
Hemoglobin: 13.4 g/dL (ref 12.0–15.0)
LYMPHS ABS: 2.2 10*3/uL (ref 0.7–4.0)
Lymphocytes Relative: 22 % (ref 12–46)
MCH: 30.8 pg (ref 26.0–34.0)
MCHC: 33.5 g/dL (ref 30.0–36.0)
MCV: 92 fL (ref 78.0–100.0)
MPV: 8.8 fL (ref 8.6–12.4)
Monocytes Absolute: 1.2 10*3/uL — ABNORMAL HIGH (ref 0.1–1.0)
Monocytes Relative: 12 % (ref 3–12)
NEUTROS PCT: 64 % (ref 43–77)
Neutro Abs: 6.3 10*3/uL (ref 1.7–7.7)
Platelets: 394 10*3/uL (ref 150–400)
RBC: 4.35 MIL/uL (ref 3.87–5.11)
RDW: 13.5 % (ref 11.5–15.5)
WBC: 9.8 10*3/uL (ref 4.0–10.5)

## 2014-05-03 NOTE — Progress Notes (Signed)
Subjective:    Patient ID: Mia Hernandez, female    DOB: Dec 23, 1940, 74 y.o.   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 She first started on therapy with clarithromycin rifampin and ethambutol 3 days per week. She had great difficulty tolerating the Biaxin which was she was taking it twice daily doses on the day she was taking it was reduced to once daily dosing on July 01 2012 Apparently at that time there was concern about toxicity although she states that now that she was found to need simply new hearing aids. Initially while on therapy for Mycobacterium avium showed mprovement in her symptoms of chronic daily cough and sputum production.   In Fall of 2013 while [place] 2013 apparently she had episodes of hemoptysis and was hospitalized due to Hospital in [place] where she was placed in isolation for rule out tuberculosis. Ultimately was realized that she was suffering from Mycobacterium avium infection alone.  . She had repeat sputum taken Spring of 2014 and again grow Mycobacterium avium. The organism was sent for susceptibility testing and showed macrolide sensitivity and in vitro synergy with rifampin and ETH in inhibiting growth in lab  I had Changed her to  daily therapy with macrolide (Azithromycin) ethambutol and rifampin. Since change she had been initiallycoughing less, she was gaining weight and was  absent fevers.   She saw Dr. Annamaria Boots in November 2014 when she reported 1 month she is coughing up more yellow/liquid phlem since on Rafampin. Chest x-ray was done and showed no changes. Sputum was sent for routine culture which only isolated normal oral pharyngeal flora. AFB culture is without AFB seen on smear blood cultures in November DID grow M avium again   I had tried to get her on daily RIFABUTIN with continue azithro and ETH but she did not tolerate at all with severe anorexia, and  apparent visual problems, stating that "I could not see for several hours."  She went back to Rifampin TIW,  azithro 500mg  daily, ETH 700mg  TU, TH< SA< SU  In the interim she was seen by her Cardiologist Dr. Sallyanne Kuster  who saw her in October and found NEW  deep symmetrical inverted T waves in all the inferior leads as well as V3-V6. The QTC prolongation on EKG that persistd on followup EKG. he was concern for possible antibiotic induced myocarditis and the patient stopped her anti-Mycobacterium drugs and had MRI of her heart performed which was normal echocardiogram was also normal.  Repeat EKG done in the next few days showed resolution of T wave changes and improvement in QT  Since coming off her anti-Mycobacterium avium drugs she has continued to cough on a daily basis   She had CXR that showed expansion of cavitary disease.  Since we last saw her she had onset of blood streaked sputum last Thursday and again last night. No fevers, no dizziness, or light headedness. She has been referred to Dr. Brigitte Pulse at [facility] but not yet seen Dr. Lorenda Cahill.   Note her M avium was R to Moxifloxacin with MIC of 4.  It was I to linezolid with MIC of 16  It was S to clarithro with MIC of 2  Further MICs were:  Amikacin 16 Cipro 16 Ethambutol 8 Ethimodate > INH 8 Rifampin >8 Rifabutin 0.5 Streptomcin 32  Combination of ETH with clarithro, erythrom, rifampin, rifabutin all produced no growth  in vitro  See below          Review of Systems  Constitutional: Negative for diaphoresis, activity change, appetite change, fatigue and unexpected weight change.  HENT: Negative for congestion, sinus pressure, sneezing and trouble swallowing.   Eyes: Negative for photophobia and visual disturbance.  Respiratory: Positive for cough, chest tightness and wheezing. Negative for shortness of breath and stridor.   Cardiovascular: Negative for palpitations and leg swelling.  Gastrointestinal:  Negative for vomiting, abdominal pain, diarrhea, constipation, blood in stool, abdominal distention and anal bleeding.  Genitourinary: Negative for dysuria, hematuria, flank pain and difficulty urinating.  Musculoskeletal: Negative for back pain, joint swelling, arthralgias and gait problem.  Skin: Negative for color change, pallor and wound.  Neurological: Negative for dizziness, tremors, weakness and light-headedness.  Hematological: Negative for adenopathy. Does not bruise/bleed easily.  Psychiatric/Behavioral: Negative for behavioral problems, sleep disturbance, dysphoric mood, decreased concentration and agitation.       Objective:   Physical Exam  Constitutional: She is oriented to person, place, and time. She appears well-developed and well-nourished. No distress.  HENT:  Head: Normocephalic and atraumatic.  Mouth/Throat: No oropharyngeal exudate.  Eyes: Conjunctivae and EOM are normal. No scleral icterus.  Neck: Normal range of motion. Neck supple.  Cardiovascular: Normal rate, regular rhythm and normal heart sounds.  Exam reveals no gallop and no friction rub.   No murmur heard. Pulmonary/Chest: Effort normal. No respiratory distress. She has no wheezes.  Crackles at bases, prolonged expiratory phase  Abdominal: She exhibits no distension.  Musculoskeletal: She exhibits no edema or tenderness.  Neurological: She is alert and oriented to person, place, and time. She exhibits normal muscle tone. Coordination normal.  Skin: Skin is warm and dry. No rash noted. She is not diaphoretic. No erythema. No pallor.  Psychiatric: She has a normal mood and affect. Her behavior is normal. Judgment and thought content normal.          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   Azithromycin induced myocarditis IS reported int he literature but is rare and it is not clear to me  that her EKG changes were due to a macrolide induced hypersensitivity reaction but obviously the risk of rechallenge is not trivial  Her M avium was R to FQ and she states that the last time she took levaquin she could "barely walk due to pain in back of my legs? -? Achilles tendonitis from FQ?  Not clear to me that Amikacin would be active  I have tried to get her in with Brigitte Pulse at [facility] for 2nd opinion and will also email Corene Cornea re his thoughts and recs. Clearly her M avium disease is progressing absent therapy and now with new blood streaked sputum and increase in size of lung cavities  I spent greater than 40 minutes with the patient including greater than 50% of time in face to face counsel of the patient and in coordination of their care.   #2 Abnormal EKG :see above discssuion  #3 Bronchiectasis: due to #1 with cavity: there is risk of fungal infection esp aspergilloma in this cavity to be need to be watched carefully.

## 2014-05-04 ENCOUNTER — Encounter: Payer: Self-pay | Admitting: Infectious Disease

## 2014-05-04 DIAGNOSIS — I514 Myocarditis, unspecified: Secondary | ICD-10-CM

## 2014-05-04 DIAGNOSIS — T50905A Adverse effect of unspecified drugs, medicaments and biological substances, initial encounter: Secondary | ICD-10-CM

## 2014-05-04 HISTORY — DX: Myocarditis, unspecified: I51.4

## 2014-05-04 HISTORY — DX: Adverse effect of unspecified drugs, medicaments and biological substances, initial encounter: T50.905A

## 2014-05-04 LAB — SEDIMENTATION RATE: Sed Rate: 17 mm/hr (ref 0–30)

## 2014-05-05 DIAGNOSIS — I10 Essential (primary) hypertension: Secondary | ICD-10-CM | POA: Diagnosis not present

## 2014-05-05 DIAGNOSIS — M47812 Spondylosis without myelopathy or radiculopathy, cervical region: Secondary | ICD-10-CM | POA: Diagnosis not present

## 2014-05-06 ENCOUNTER — Other Ambulatory Visit: Payer: Self-pay | Admitting: Neurosurgery

## 2014-05-06 DIAGNOSIS — M47812 Spondylosis without myelopathy or radiculopathy, cervical region: Secondary | ICD-10-CM

## 2014-05-07 ENCOUNTER — Encounter: Payer: Self-pay | Admitting: Infectious Disease

## 2014-05-10 ENCOUNTER — Ambulatory Visit
Admission: RE | Admit: 2014-05-10 | Discharge: 2014-05-10 | Disposition: A | Discharge status: Home / Self Care | Payer: Medicare Other | Source: Ambulatory Visit

## 2014-05-10 DIAGNOSIS — Z1231 Encounter for screening mammogram for malignant neoplasm of breast: Secondary | ICD-10-CM

## 2014-05-11 ENCOUNTER — Telehealth: Payer: Self-pay | Admitting: Internal Medicine

## 2014-05-11 ENCOUNTER — Encounter: Payer: Self-pay | Admitting: Infectious Disease

## 2014-05-11 NOTE — Telephone Encounter (Signed)
Called spoke with pt. She reports Dr. Tommy Medal is no longer treating her d/t having heart problems from the medication. She reports Dr. Tommy Medal transferred her to [place] Dr. Brigitte Pulse. She is needing her CT/CXR placed on disk for pick up. I advised her she will need to contact out medical records dept and CT on [address] and request this. She will do so. Nothing further needed

## 2014-05-12 ENCOUNTER — Encounter: Payer: Self-pay | Admitting: Cardiovascular Disease

## 2014-05-13 ENCOUNTER — Telehealth: Payer: Self-pay | Admitting: *Deleted

## 2014-05-13 NOTE — Telephone Encounter (Signed)
In response to her [employer], RN left phone message making sure that the patient has spoken with medical records at [facility] to request all images to be sent to [facility].  See below:  Tentatively scheduled with [facility] August 1st. However, Dr. Romie Levee assistant spoke with him. In order to see me and to consider seeing me earlier he must have all films/CDs of CT scans and xrays asap.  She had spoken with your office today to fax other materials.  Mail films/CDs to:    [facility]     [phone]    Attn: [facility]    [address]    [place], [place] [address]    Landis Gandy, RN

## 2014-05-17 ENCOUNTER — Telehealth: Payer: Self-pay | Admitting: *Deleted

## 2014-05-17 NOTE — Telephone Encounter (Addendum)
Pt's sputum contains blood when she coughs, wanting appt moved up from May 25.  RN was able to move appt to 8:45 AM, Wed., May 11 w/ Dr. Tommy Medal.  Appt at [facility] is not scheduled until August, 2016.

## 2014-05-17 NOTE — Telephone Encounter (Signed)
I spoke with Brigitte Pulse at [facility] and he recommended that we do not initiate any treatment for this patient until he can assess the patient himself.    If the patient once to be seen more urgently at [facility] she may need to be admitted there  At this point I would not change her management and I am not anxious to rechallange with macrolide or start IV amikacin in the oupatient setting. She may have to be admittted to [facility] and potentially transferred to [facility] if she worsens

## 2014-05-19 ENCOUNTER — Ambulatory Visit (INDEPENDENT_AMBULATORY_CARE_PROVIDER_SITE_OTHER): Payer: Medicare Other | Admitting: Infectious Disease

## 2014-05-19 ENCOUNTER — Encounter: Payer: Self-pay | Admitting: Cardiovascular Disease

## 2014-05-19 ENCOUNTER — Encounter: Payer: Self-pay | Admitting: Infectious Disease

## 2014-05-19 VITALS — BP 160/92 | HR 81 | Temp 98.1°F | Wt 101.0 lb

## 2014-05-19 DIAGNOSIS — A310 Pulmonary mycobacterial infection: Secondary | ICD-10-CM | POA: Diagnosis present

## 2014-05-19 DIAGNOSIS — T50905A Adverse effect of unspecified drugs, medicaments and biological substances, initial encounter: Secondary | ICD-10-CM

## 2014-05-19 DIAGNOSIS — I514 Myocarditis, unspecified: Secondary | ICD-10-CM | POA: Diagnosis not present

## 2014-05-19 DIAGNOSIS — I10 Essential (primary) hypertension: Secondary | ICD-10-CM

## 2014-05-19 DIAGNOSIS — R9431 Abnormal electrocardiogram [ECG] [EKG]: Secondary | ICD-10-CM

## 2014-05-19 DIAGNOSIS — J471 Bronchiectasis with (acute) exacerbation: Secondary | ICD-10-CM

## 2014-05-19 DIAGNOSIS — I309 Acute pericarditis, unspecified: Secondary | ICD-10-CM | POA: Diagnosis not present

## 2014-05-19 NOTE — Progress Notes (Signed)
Subjective:    Patient ID: Mia Hernandez, female    DOB: [birth year], 74 y.o.   MRN: [id number]  Cough This is a recurrent problem. The current episode started 1 to 4 weeks ago. The problem has been gradually worsening. The problem occurs every few hours. The cough is productive of blood-tinged sputum. Associated symptoms include shortness of breath, weight loss and wheezing. The symptoms are aggravated by lying down. She has tried body position changes for the symptoms. The treatment provided no relief. Her past medical history is significant for bronchiectasis.   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 She first started on therapy with clarithromycin rifampin and ethambutol 3 days per week. She had great difficulty tolerating the Biaxin which was she was taking it twice daily doses on the day she was taking it was reduced to once daily dosing on July 01 2012 Apparently at that time there was concern about toxicity although she states that now that she was found to need simply new hearing aids. Initially while on therapy for Mycobacterium avium showed mprovement in her symptoms of chronic daily cough and sputum production.   In Fall of 2013 while [place] 2013 apparently she had episodes of hemoptysis and was hospitalized due to Hospital in [place] where she was placed in isolation for rule out tuberculosis. Ultimately was realized that she was suffering from Mycobacterium avium infection alone.  . She had repeat sputum taken Spring of 2014 and again grow Mycobacterium avium. The organism was sent for susceptibility testing and showed macrolide sensitivity and in vitro synergy with rifampin and ETH in inhibiting growth in lab  I had Changed her to  daily therapy with macrolide (Azithromycin) ethambutol and rifampin. Since change she had been initiallycoughing less, she was gaining weight and was  absent fevers.   She saw  Dr. Annamaria Boots in November 2014 when she reported 1 month she is coughing up more yellow/liquid phlem since on Rafampin. Chest x-ray was done and showed no changes. Sputum was sent for routine culture which only isolated normal oral pharyngeal flora. AFB culture is without AFB seen on smear blood cultures in November DID grow M avium again   I had tried to get her on daily RIFABUTIN with continue azithro and ETH but she did not tolerate at all with severe anorexia, and apparent visual problems, stating that "I could not see for several hours."  She went back to Rifampin TIW,  azithro 500mg  daily, ETH 700mg  TU, TH< SA< SU  In the interim she was seen by her Cardiologist Dr. Sallyanne Kuster  who saw her in October and found NEW  deep symmetrical inverted T waves in all the inferior leads as well as V3-V6. The QTC prolongation on EKG that persistd on followup EKG. he was concern for possible antibiotic induced myocarditis and the patient stopped her anti-Mycobacterium drugs and had MRI of her heart performed which was normal echocardiogram was also normal.  Repeat EKG done in the next few days showed resolution of T wave changes and improvement in QT  Since coming off her anti-Mycobacterium avium drugs she has continued to cough on a daily basis   She had CXR that showed expansion of cavitary disease.  Since we last saw her she had onset of blood streaked sputum prior to her visit with me on April 25th, 2016 No fevers, no dizziness, or light headedness. She has been referred to Dr. Brigitte Pulse  at [facility] but not yet seen Dr. Lorenda Cahill.   Note her M avium was R to Moxifloxacin with MIC of 4.  It was I to linezolid with MIC of 16  It was S to clarithro with MIC of 2  Further MICs were:  Amikacin 16 Cipro 16 Ethambutol 8 Ethimodate > INH 8 Rifampin >8 Rifabutin 0.5 Streptomcin 32  Combination of ETH with clarithro, erythrom, rifampin, rifabutin all produced no growth in vitro  See  below      I corresponded with Dr Lorenda Cahill via email and he recommended not starting any therapy until he had a chance to review her case himself when she comes to visit him.  Unfortunately since I last saw the patient she has had worsening of her cough and now with more frequent blood tinged sputum.  No fevers, myalgias or rigors.     Review of Systems  Constitutional: Positive for weight loss. Negative for diaphoresis, activity change, appetite change, fatigue and unexpected weight change.  HENT: Negative for congestion, sinus pressure, sneezing and trouble swallowing.   Eyes: Negative for photophobia and visual disturbance.  Respiratory: Positive for cough, chest tightness, shortness of breath and wheezing. Negative for stridor.   Cardiovascular: Negative for palpitations and leg swelling.  Gastrointestinal: Negative for vomiting, abdominal pain, diarrhea, constipation, blood in stool, abdominal distention and anal bleeding.  Genitourinary: Negative for dysuria, hematuria, flank pain and difficulty urinating.  Musculoskeletal: Negative for back pain, joint swelling, arthralgias and gait problem.  Skin: Negative for color change, pallor and wound.  Neurological: Negative for dizziness, tremors, weakness and light-headedness.  Hematological: Negative for adenopathy. Does not bruise/bleed easily.  Psychiatric/Behavioral: Negative for behavioral problems, sleep disturbance, dysphoric mood, decreased concentration and agitation.       Objective:   Physical Exam  Constitutional: She is oriented to person, place, and time. She appears well-developed and well-nourished. No distress.  HENT:  Head: Normocephalic and atraumatic.  Mouth/Throat: No oropharyngeal exudate.  Eyes: Conjunctivae and EOM are normal. No scleral icterus.  Neck: Normal range of motion. Neck supple.  Cardiovascular: Normal rate, regular rhythm and normal heart sounds.  Exam reveals no gallop and no friction rub.    No murmur heard. Pulmonary/Chest: Effort normal. No respiratory distress. She has no wheezes.  Crackles at bases, prolonged expiratory phase  Abdominal: She exhibits no distension.  Musculoskeletal: She exhibits no edema or tenderness.  Neurological: She is alert and oriented to person, place, and time. She exhibits normal muscle tone. Coordination normal.  Skin: Skin is warm and dry. No rash noted. She is not diaphoretic. No erythema. No pallor.  Psychiatric: She has a normal mood and affect. Her behavior is normal. Judgment and thought content normal.          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 -? Achilles tendonitis from FQ?  Per Dr. Lorenda Cahill the Amikacin would be considered active at this MIC, but it is itself NOT without risk in elderly pt who  already has hearing loss and would be at risk for renal failure.   I have proposed to the pt that if she continues to worsen my recommendation would be inpatient admission to [facility] (or [facility] directly) vs transfer to [facility] where she could be seen by Dr Lorenda Cahill.  IF we kept her at [facility] would consider either RE-CHALLENGING with a MACROlide with the patient on constant telemetry + rifampin (she apparently had loss of vision with rifabutin?) and ethambutol  Not sure that clarithro would be less risk for pericarditis/myocarditis than azithromycin (again this is very rare reported condition)   #2 Abnormal EKG :see above discssuion  #3 Bronchiectasis: due to #1 with  cavity: there is risk of fungal infection esp aspergilloma in this cavity to be need to be watched carefully.  #4 HTN: not well controlled but in circumstance of her MAvium causing much misery, der to primary care MD.

## 2014-05-25 ENCOUNTER — Ambulatory Visit
Admission: RE | Admit: 2014-05-25 | Discharge: 2014-05-25 | Disposition: A | Discharge status: Home / Self Care | Payer: Medicare Other | Source: Ambulatory Visit | Attending: Neurosurgery | Admitting: Neurosurgery

## 2014-05-25 DIAGNOSIS — M5032 Other cervical disc degeneration, mid-cervical region: Secondary | ICD-10-CM | POA: Diagnosis not present

## 2014-05-25 DIAGNOSIS — M47813 Spondylosis without myelopathy or radiculopathy, cervicothoracic region: Secondary | ICD-10-CM | POA: Diagnosis not present

## 2014-05-25 DIAGNOSIS — M5031 Other cervical disc degeneration,  high cervical region: Secondary | ICD-10-CM | POA: Diagnosis not present

## 2014-05-25 DIAGNOSIS — M47812 Spondylosis without myelopathy or radiculopathy, cervical region: Secondary | ICD-10-CM

## 2014-05-27 DIAGNOSIS — A312 Disseminated mycobacterium avium-intracellulare complex (DMAC): Secondary | ICD-10-CM | POA: Diagnosis not present

## 2014-05-27 DIAGNOSIS — Z1389 Encounter for screening for other disorder: Secondary | ICD-10-CM | POA: Diagnosis not present

## 2014-05-27 DIAGNOSIS — Z681 Body mass index (BMI) 19 or less, adult: Secondary | ICD-10-CM | POA: Diagnosis not present

## 2014-05-27 DIAGNOSIS — Z0000 Encounter for general adult medical examination without abnormal findings: Secondary | ICD-10-CM | POA: Diagnosis not present

## 2014-05-27 DIAGNOSIS — M810 Age-related osteoporosis without current pathological fracture: Secondary | ICD-10-CM | POA: Diagnosis not present

## 2014-05-31 ENCOUNTER — Telehealth: Payer: Self-pay | Admitting: *Deleted

## 2014-05-31 NOTE — Telephone Encounter (Signed)
Pt still c/o blood-tinged sputum.  Appt at [facility] is scheduled for Aug. 8, 2016.  Pt conferring with cardiologist, Dr. Sallyanne Kuster, about the need to be hospitalized for treatment.  Pt requesting that Dr. Tommy Medal speak with Dr. Sallyanne Kuster about treatment options.  Pt would appreciate a note from Dr. Tommy Medal via [place] about the results of his conversation with Dr. Sallyanne Kuster.

## 2014-06-02 ENCOUNTER — Ambulatory Visit: Payer: Medicare Other | Admitting: Infectious Disease

## 2014-06-02 DIAGNOSIS — I10 Essential (primary) hypertension: Secondary | ICD-10-CM | POA: Diagnosis not present

## 2014-06-02 DIAGNOSIS — M47812 Spondylosis without myelopathy or radiculopathy, cervical region: Secondary | ICD-10-CM | POA: Diagnosis not present

## 2014-06-08 ENCOUNTER — Encounter: Payer: Self-pay | Admitting: Infectious Disease

## 2014-06-08 ENCOUNTER — Ambulatory Visit (INDEPENDENT_AMBULATORY_CARE_PROVIDER_SITE_OTHER): Payer: Medicare Other | Admitting: Infectious Disease

## 2014-06-08 ENCOUNTER — Telehealth: Payer: Self-pay | Admitting: Licensed Clinical Social Worker

## 2014-06-08 VITALS — BP 177/109 | HR 94 | Temp 97.7°F | Wt 102.0 lb

## 2014-06-08 DIAGNOSIS — R9431 Abnormal electrocardiogram [ECG] [EKG]: Secondary | ICD-10-CM

## 2014-06-08 DIAGNOSIS — I4581 Long QT syndrome: Secondary | ICD-10-CM

## 2014-06-08 DIAGNOSIS — I10 Essential (primary) hypertension: Secondary | ICD-10-CM | POA: Diagnosis not present

## 2014-06-08 DIAGNOSIS — I309 Acute pericarditis, unspecified: Secondary | ICD-10-CM | POA: Diagnosis not present

## 2014-06-08 DIAGNOSIS — T50905A Adverse effect of unspecified drugs, medicaments and biological substances, initial encounter: Secondary | ICD-10-CM

## 2014-06-08 DIAGNOSIS — A310 Pulmonary mycobacterial infection: Secondary | ICD-10-CM

## 2014-06-08 DIAGNOSIS — I514 Myocarditis, unspecified: Secondary | ICD-10-CM | POA: Diagnosis not present

## 2014-06-08 NOTE — Telephone Encounter (Signed)
I had asked that she be seen at [facility]. Do we know how soon she will be seen there?  If she is really feeling badly enough to be admitted we can facilitate this but she will probably be in the hospital for several days and possibly need to be transferred to [facility].   If she truly feels badly enough we can add her to the schedule though I have a very tight schedule with a phone call to 2pm and double book at 145, we can likely facilitate an admission to [facility] with consult from ID here and telephonic advice from [facility]--though I suspect it will be a fairly long process and no quick fixes here

## 2014-06-08 NOTE — Progress Notes (Signed)
Subjective:    Patient ID: Mia Hernandez, female    DOB: June 18, 1940, 74 y.o.   MRN: [id number]  Cough This is a recurrent problem. The current episode started more than 1 year ago. The problem has been gradually worsening. The problem occurs every few hours. The cough is productive of blood-tinged sputum. Associated symptoms include shortness of breath, weight loss and wheezing. The symptoms are aggravated by lying down. She has tried body position changes for the symptoms. The treatment provided no relief. Her past medical history is significant for bronchiectasis.   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 She first started on therapy with clarithromycin rifampin and ethambutol 3 days per week. She had great difficulty tolerating the Biaxin which was she was taking it twice daily doses on the day she was taking it was reduced to once daily dosing on July 01 2012 Apparently at that time there was concern about toxicity although she states that now that she was found to need simply new hearing aids. Initially while on therapy for Mycobacterium avium showed mprovement in her symptoms of chronic daily cough and sputum production.   In Fall of 2013 while [place] 2013 apparently she had episodes of hemoptysis and was hospitalized due to Hospital in [place] where she was placed in isolation for rule out tuberculosis. Ultimately was realized that she was suffering from Mycobacterium avium infection alone.  . She had repeat sputum taken Spring of 2014 and again grow Mycobacterium avium. The organism was sent for susceptibility testing and showed macrolide sensitivity and in vitro synergy with rifampin and ETH in inhibiting growth in lab. Amikacin would also be considered active at S she had.  I had Changed her to  daily therapy with macrolide (Azithromycin) ethambutol and rifampin. Since change she had been initiallycoughing less,  she was gaining weight and was  absent fevers.   She saw Dr. Annamaria Boots in November 2014 when she reported 1 month she is coughing up more yellow/liquid phlem since on Rafampin. Chest x-ray was done and showed no changes. Sputum was sent for routine culture which only isolated normal oral pharyngeal flora. AFB culture is without AFB seen on smear blood cultures in November DID grow M avium again   I had tried to get her on daily RIFABUTIN with continue azithro and ETH but she did not tolerate at all with severe anorexia, and apparent visual problems, stating that "I could not see for several hours."  She went back to Rifampin TIW,  azithro 500mg  daily, ETH 700mg  TU, TH< SA< SU  In the interim she was seen by her Cardiologist Dr. Sallyanne Kuster  who saw her in October and found NEW  deep symmetrical inverted T waves in all the inferior leads as well as V3-V6. The QTC prolongation on EKG that persistd on followup EKG. he was concern for possible antibiotic induced myocarditis and the patient stopped her anti-Mycobacterium drugs and had MRI of her heart performed which was normal echocardiogram was also normal.  Repeat EKG done in the next few days showed resolution of T wave changes and improvement in QT  Since coming off her anti-Mycobacterium avium drugs she has continued to cough on a daily basis   She had CXR that showed expansion of cavitary disease though most recent CXR showed stable findings.   Note her M avium was R to Moxifloxacin with MIC of 4.  It was I to linezolid with  MIC of 16  It was S to clarithro with MIC of 2  Further MICs were:  Amikacin 16 Cipro 16 Ethambutol 8 Ethimodate > INH 8 Rifampin >8 Rifabutin 0.5 Streptomcin 32  Combination of ETH with clarithro, erythrom, rifampin, rifabutin all produced no growth in vitro  See below      She has come to our clinic multiple times this Spring with worsening cough, with blood tinged sputum and actual hemoptysis, fatigue,  lack of energy.  I corresponded with Dr Lorenda Cahill via email and he recommended not starting any therapy until he had a chance to review her case himself when she comes to visit him. She is on schedule to see him in August.  She was worked into clinic today with worsening blood tinged sputum and now expressing a desire over the phone to be admitted to the hospital for consideration of re-challenge with her prior M avium drugs.   Review of Systems  Constitutional: Positive for weight loss. Negative for diaphoresis, activity change, appetite change, fatigue and unexpected weight change.  HENT: Negative for congestion, sinus pressure, sneezing and trouble swallowing.   Eyes: Negative for photophobia and visual disturbance.  Respiratory: Positive for cough, chest tightness, shortness of breath and wheezing. Negative for stridor.   Cardiovascular: Negative for palpitations and leg swelling.  Gastrointestinal: Negative for vomiting, abdominal pain, diarrhea, constipation, blood in stool, abdominal distention and anal bleeding.  Genitourinary: Negative for dysuria, hematuria, flank pain and difficulty urinating.  Musculoskeletal: Negative for back pain, joint swelling, arthralgias and gait problem.  Skin: Negative for color change, pallor and wound.  Neurological: Negative for dizziness, tremors, weakness and light-headedness.  Hematological: Negative for adenopathy. Does not bruise/bleed easily.  Psychiatric/Behavioral: Negative for behavioral problems, sleep disturbance, dysphoric mood, decreased concentration and agitation.       Objective:   Physical Exam  Constitutional: She is oriented to person, place, and time. She appears well-developed and well-nourished. No distress.  HENT:  Head: Normocephalic and atraumatic.  Mouth/Throat: No oropharyngeal exudate.  Eyes: Conjunctivae and EOM are normal. No scleral icterus.  Neck: Normal range of motion. Neck supple.  Cardiovascular: Normal rate,  regular rhythm and normal heart sounds.  Exam reveals no gallop and no friction rub.   No murmur heard. Pulmonary/Chest: Effort normal. No respiratory distress. She has no wheezes.  Crackles at bases, prolonged expiratory phase  Abdominal: She exhibits no distension.  Musculoskeletal: She exhibits no edema or tenderness.  Neurological: She is alert and oriented to person, place, and time. She exhibits normal muscle tone. Coordination normal.  Skin: Skin is warm and dry. No rash noted. She is not diaphoretic. No erythema. No pallor.  Psychiatric: She has a normal mood and affect. Her behavior is normal. Judgment and thought content normal.          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 -? Achilles tendonitis from FQ?  Per Dr. Lorenda Cahill the Amikacin would be considered active at this MIC, but it  is itself NOT without risk in elderly pt who already has hearing loss and would be at risk for renal failure.   I had proposed to the pt that if she continues to worsen my recommendation would be inpatient admission to [facility] (or [facility] directly) vs transfer to [facility] where she could be seen by Dr Lorenda Cahill. Today after further discussion she decided she did not want to come into the hospital.   We discussed idea of challenging her with these drugs as an outpatient and having her monitored by Dr. Sallyanne Kuster with serial EKG vs Loop recorder?  I will also touch base with Dr. Lorenda Cahill re her case and see if he thinks this is reasonable vs  trying to see if one could get clofazimine    #2 Abnormal EKG :see above discssuion  #3 Bronchiectasis: due to #1 with cavity: there is risk of fungal infection esp aspergilloma in this cavity to be need to be watched carefully.  #4 HTN: not well controlled  At all when she comes to our clinic but claims her BP is "fine" when she checks it at home MAvium causing much misery, der to primary care MD.  I spent greater than 40 minutes with the patient including greater than 50% of time in face to face counsel of the patient re her M avium infection, risks benefits of therapiew and in coordination of her  care.

## 2014-06-08 NOTE — Telephone Encounter (Signed)
Patient called stating that her coughing is worse and she is coughing up blood still. Patient states that Dr. Tommy Medal asked the patient to call if symptoms worsened and she could be worked in and be admitted from the clinic. Please advise

## 2014-06-11 ENCOUNTER — Ambulatory Visit (INDEPENDENT_AMBULATORY_CARE_PROVIDER_SITE_OTHER): Payer: Medicare Other | Admitting: Cardiovascular Disease

## 2014-06-11 ENCOUNTER — Encounter: Payer: Self-pay | Admitting: Cardiovascular Disease

## 2014-06-11 VITALS — BP 170/96 | HR 99 | Ht 59.0 in | Wt 102.0 lb

## 2014-06-11 DIAGNOSIS — I10 Essential (primary) hypertension: Secondary | ICD-10-CM

## 2014-06-11 DIAGNOSIS — J690 Pneumonitis due to inhalation of food and vomit: Secondary | ICD-10-CM

## 2014-06-11 LAB — CBC
HCT: 42 % (ref 36.0–46.0)
Hemoglobin: 14.3 g/dL (ref 12.0–15.0)
MCH: 31.1 pg (ref 26.0–34.0)
MCHC: 34 g/dL (ref 30.0–36.0)
MCV: 91.3 fL (ref 78.0–100.0)
MPV: 9.1 fL (ref 8.6–12.4)
PLATELETS: 353 10*3/uL (ref 150–400)
RBC: 4.6 MIL/uL (ref 3.87–5.11)
RDW: 13.6 % (ref 11.5–15.5)
WBC: 11.1 10*3/uL — AB (ref 4.0–10.5)

## 2014-06-11 NOTE — Progress Notes (Signed)
Patient ID: Mia Hernandez, female   DOB: 11-17-1940, 74 y.o.   MRN: [id number]     Cardiology Office Note   Date:  06/13/2014   ID:  Mia Hernandez, DOB Jan 17, 1940, MRN [id number]  PCP:  Geoffery Lyons, MD  Cardiologist:   Sanda Klein, MD   Chief Complaint  Patient presents with  . Follow-up    Patient has no complaints.      History of Present Illness: Mia Hernandez is a 74 y.o. female who presents for discussion of monitoring prior to reinitiation of antibiotic therapy for Mycobacterium avium intracellulare infection. She has had a long-standing problem with cavitary lung disease and has been on a variety of different anti-biotic combinations. All of her medications were stopped when she developed left ventricular dysfunction and marked ECG changes with severe QT prolongation suggestive of myocarditis, confirmed by MRI. Within a month of discontinuing her antibiotics the EKG changes returned to normal. Unfortunately, her cough has persisted and even worsened and she is now having constant hemoptysis.  It was felt that the macrolide (azithromycin) was the most likely culprit for her myocarditis. We plan to reintroduce her medications one at a time to see if can identify a culprit and will be using periodic EKG screening to hopefully detect myocardial abnormalities early.  She brings a detailed record of blood pressure recordings. For the most part her blood pressure has been in normal range although there are occasional episodic elevations in systolic blood pressure to the 150s.  Past Medical History  Diagnosis Date  . Osteoporosis   . ALLERGIC RHINITIS   . Chronic back pain   . Mycobacterium avium-intracellulare infection   . Bronchiectasis   . 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 Cholecystectomy    . Vaginal hysterectomy    . Laminectomy    . Breast biopsy       Current Outpatient Prescriptions  Medication Sig Dispense  Refill  . chlorthalidone (HYGROTON) 25 MG tablet Take 0.5 tablets (12.5 mg total) by mouth 3 (three) times a week. Monday, Wednesday, and Friday 75 tablet 3  . valsartan (DIOVAN) 320 MG tablet Take 320 mg by mouth daily.     No current facility-administered medications for this visit.    Allergies:   Latex; Rifabutin; Aspirin; Betadine; Codeine; Iodinated diagnostic agents; Macrolides and ketolides; Morphine and related; Penicillins; Prednisone; and Sulfa antibiotics    Social History:  The patient  reports that she has never smoked. She has never used smokeless tobacco. She reports that she drinks about 0.6 oz of alcohol per week. She reports that she does not use illicit drugs.   Family History:  The patient's family history includes Breast cancer in her mother; Pulmonary embolism in her brother; Stroke in her father.    ROS:  Please see the history of present illness.    Otherwise, review of systems positive for cough, hemoptysis, no palpitations.   All other systems are reviewed and negative.    PHYSICAL EXAM: VS:  BP 170/96 mmHg  Pulse 99  Ht $R'4\' 11"'cX$  (1.499 m)  Wt 46.267 kg (102 lb)  BMI 20.59 kg/m2 , BMI Body mass index is 20.59 kg/(m^2). BP recheck was 147/81. General: Alert, oriented x3, no distress Head: no evidence of trauma, PERRL, EOMI, no exophtalmos or lid lag, no myxedema, no xanthelasma; normal ears, nose and oropharynx Neck: normal jugular venous pulsations and no hepatojugular reflux; brisk carotid pulses without delay and no carotid  bruits Chest: clear to auscultation, no signs of consolidation by percussion or palpation, normal fremitus, symmetrical and full respiratory excursions Cardiovascular: normal position and quality of the apical impulse, regular rhythm, normal first and second heart sounds, no murmurs, rubs or gallops Abdomen: no tenderness or distention, no masses by palpation, no abnormal pulsatility or arterial bruits, normal bowel sounds, no  hepatosplenomegaly Extremities: no clubbing, cyanosis or edema; 2+ radial, ulnar and brachial pulses bilaterally; 2+ right femoral, posterior tibial and dorsalis pedis pulses; 2+ left femoral, posterior tibial and dorsalis pedis pulses; no subclavian or femoral bruits Neurological: grossly nonfocal Psych: euthymic mood, full affect   EKG:  EKG is ordered today. The ekg ordered today demonstrates normal sinus rhythm at 94 bpm, QT 360 ms, QTC 450 ms, possible left atrial abnormality. No repolarization changes are seen.   Recent Labs: 10/22/2013: ALT 21; Magnesium 2.1 05/03/2014: BUN 11; Creatinine 0.58; Potassium 4.1; Sodium 130* 06/11/2014: Hemoglobin 14.3; Platelets 353    Lipid Panel    Component Value Date/Time   CHOL 212* 03/08/2014 0834   TRIG 58 03/08/2014 0834   HDL 120 03/08/2014 0834   CHOLHDL 1.8 03/08/2014 0834   VLDL 12 03/08/2014 0834   LDLCALC 80 03/08/2014 0834      Wt Readings from Last 3 Encounters:  06/11/14 46.267 kg (102 lb)  06/08/14 46.267 kg (102 lb)  05/19/14 45.813 kg (101 lb)      Other studies Reviewed: Additional studies/ records that were reviewed today include: Multiple records from infectious disease clinic, Dr. Tommy Medal and discussions with Dr. Lorenda [facility]ill at [facility].    ASSESSMENT AND PLAN:  Acute myocarditis (presumed hypersensitivity to macrolide antibiotic), resolved Currently not receiving any antibiotic therapy for her MAI infection. Agents will be restarted one at a time and will plan ECG monitoring at least once every 2-4 weeks. In fact would recommend an ECG within 2 weeks of starting any new agent. We'll also use a sedimentation rate to hopefully detect signs of new inflammation. ESR is normal now.  Systemic hypertension Blood pressure control is good. At home the typical blood pressure is around 1:30/70. Occasional episodic increases (such as today) seem to be related to discomfort and frustration regarding her lung problem.   Current  medicines are reviewed at length with the patient today.  The patient does not have concerns regarding medicines.  The following changes have been made:  no change  Labs/ tests ordered today include:  Orders Placed This Encounter  Procedures  . Sed Rate (ESR)  . CBC  . EKG 12-Lead    Patient Instructions  Dr Sallyanne Kuster recommends that you schedule a follow-up appointment in with him in 8 weeks.  Please schedule a nurse visit for an EKG two weeks after you start your medication. You will need an EKG every two weeks thereafter.  Please have blood work done today.     Mikael Spray, MD  06/13/2014 12:50 PM    Sanda Klein, MD, [facility] [phone] office [phone] pager

## 2014-06-11 NOTE — Patient Instructions (Addendum)
Dr Sallyanne Kuster recommends that you schedule a follow-up appointment in with him in 8 weeks.  Please schedule a nurse visit for an EKG two weeks after you start your medication. You will need an EKG every two weeks thereafter.  Please have blood work done today.

## 2014-06-12 LAB — SEDIMENTATION RATE: Sed Rate: 10 mm/hr (ref 0–30)

## 2014-06-14 ENCOUNTER — Telehealth: Payer: Self-pay | Admitting: *Deleted

## 2014-06-14 ENCOUNTER — Telehealth: Payer: Self-pay | Admitting: Infectious Disease

## 2014-06-14 MED ORDER — AZITHROMYCIN 250 MG PO TABS: 250.0000 mg | ORAL_TABLET | Freq: Every day | ORAL | Status: DC

## 2014-06-14 MED ORDER — ETHAMBUTOL HCL 400 MG PO TABS: 800.0000 mg | ORAL_TABLET | Freq: Every day | ORAL | Status: DC

## 2014-06-14 MED ORDER — ETHAMBUTOL HCL 100 MG PO TABS: 100.0000 mg | ORAL_TABLET | Freq: Every day | ORAL | Status: DC

## 2014-06-14 NOTE — Telephone Encounter (Signed)
Patient is going to take 800 mg and she is having an EKG every two weeks with her Cardiologist.

## 2014-06-14 NOTE — Telephone Encounter (Signed)
Ok very good.

## 2014-06-14 NOTE — Telephone Encounter (Signed)
I would like to have an EKG done before starting the meds  She can come to [facility] to have that done today if she likes  I have called in the new prescriptions which will be  Daily  Azithromycin 250mg    And  Daily Ethambutol = TWO 400mg  tablets + ONE 100mg  tablet for 900mg  tablet daily   For now  Dr Lorenda Cahill will likely add some other meds when she sees him down the road

## 2014-06-14 NOTE — Telephone Encounter (Signed)
That is OFF by 100mg  from dose Dr. Lorenda Cahill wanted but I am fine if we only do 800 mg. Cam someone find the EKG for me?

## 2014-06-14 NOTE — Telephone Encounter (Signed)
The note is in [place] under [facility] for DOS 06/11/14. I don't see a picture but he is explaining the results in the note.

## 2014-06-14 NOTE — Telephone Encounter (Signed)
Anxious to start medication, would appreciate a call.  Pt understands from her "heart" doctor that the three MDs have discussed her case and have come up with a plan.  Requesting Dr. Tommy Medal call her to discuss the plan and to start the medication.

## 2014-06-14 NOTE — Telephone Encounter (Signed)
I would like the patient to start this new regimen of lower dose azithromycin 250mg  daily and 900mg  ethambutol daily  Can we make sure she has an EKG BEFORE we start the meds?  It can be done here in clinic as well

## 2014-06-14 NOTE — Telephone Encounter (Signed)
Patient had EKG on Friday 6/3 and it was normal per the patient. She is concerned with the 900 mg of ethambutol, please advise. Spoke with [place] and he would like to adjust her to 2 400 mg ethambutol daily. Is this ok?

## 2014-06-15 NOTE — Telephone Encounter (Signed)
Pt states

## 2014-06-22 ENCOUNTER — Telehealth: Payer: Self-pay | Admitting: Cardiovascular Disease

## 2014-06-22 NOTE — Telephone Encounter (Signed)
Patient notified OK to take azithromycin and Chlorthalidone but need an EKG 2 weeks after starting the azithromycin.  Patient scheduled this for 06/29/14.  Patient told this will be read and evaluated while she is here in the office.

## 2014-06-22 NOTE — Telephone Encounter (Signed)
Mia Hernandez - she will need to come in for an EKG 2 weeks after starting the azithromycin.  Can you please arrange that?  There is no concern about mixing it with azithromycin with chlorthalidone.

## 2014-06-22 NOTE — Telephone Encounter (Signed)
Pt called in wanting to know if she should continue to take the Chlorthalidone while on Azithromycin. Because she says that it is a chance that it will increase the QT and the prolongation , which has caused her problems in the past. Please advise pt  Thanks

## 2014-06-24 NOTE — Telephone Encounter (Signed)
Can this encounter be closed?

## 2014-06-29 ENCOUNTER — Ambulatory Visit (INDEPENDENT_AMBULATORY_CARE_PROVIDER_SITE_OTHER): Payer: Medicare Other | Admitting: *Deleted

## 2014-06-29 DIAGNOSIS — I10 Essential (primary) hypertension: Secondary | ICD-10-CM | POA: Diagnosis not present

## 2014-06-29 DIAGNOSIS — Z5181 Encounter for therapeutic drug level monitoring: Secondary | ICD-10-CM

## 2014-06-29 NOTE — Patient Instructions (Signed)
Dr. Sallyanne Kuster reviewed your EKG and it is stable.   Please follow up for your next EKG check on Wednesday July 6th around 1-2pm, before your appointment with Dr. Tommy Medal

## 2014-07-05 ENCOUNTER — Ambulatory Visit: Payer: Medicare Other | Admitting: Internal Medicine

## 2014-07-14 ENCOUNTER — Encounter: Payer: Self-pay | Admitting: Infectious Disease

## 2014-07-14 ENCOUNTER — Ambulatory Visit (INDEPENDENT_AMBULATORY_CARE_PROVIDER_SITE_OTHER): Payer: Medicare Other | Admitting: Infectious Disease

## 2014-07-14 ENCOUNTER — Ambulatory Visit (INDEPENDENT_AMBULATORY_CARE_PROVIDER_SITE_OTHER): Payer: Medicare Other

## 2014-07-14 VITALS — BP 180/90 | HR 79 | Ht 59.0 in | Wt 104.4 lb

## 2014-07-14 VITALS — BP 189/109 | HR 81 | Temp 98.0°F | Wt 103.0 lb

## 2014-07-14 DIAGNOSIS — I10 Essential (primary) hypertension: Secondary | ICD-10-CM

## 2014-07-14 DIAGNOSIS — A310 Pulmonary mycobacterial infection: Secondary | ICD-10-CM | POA: Diagnosis not present

## 2014-07-14 DIAGNOSIS — T50905A Adverse effect of unspecified drugs, medicaments and biological substances, initial encounter: Secondary | ICD-10-CM

## 2014-07-14 DIAGNOSIS — J471 Bronchiectasis with (acute) exacerbation: Secondary | ICD-10-CM | POA: Diagnosis not present

## 2014-07-14 DIAGNOSIS — I514 Myocarditis, unspecified: Secondary | ICD-10-CM

## 2014-07-14 DIAGNOSIS — I4581 Long QT syndrome: Secondary | ICD-10-CM

## 2014-07-14 DIAGNOSIS — R9431 Abnormal electrocardiogram [ECG] [EKG]: Secondary | ICD-10-CM

## 2014-07-14 DIAGNOSIS — I309 Acute pericarditis, unspecified: Secondary | ICD-10-CM

## 2014-07-14 NOTE — Progress Notes (Signed)
1.) Reason for visit: EKG  2.) Name of MD requesting visit: Dr.Croitoru  3.) H&P: 2 weeks after starting Azithromycin  4.) ROS related to problem: No complaints  5.) Assessment and plan per MD: Repeat EKG in 2 weeks

## 2014-07-14 NOTE — Progress Notes (Signed)
Subjective:    Patient ID: Mia Hernandez, female    DOB: 09/22/1940, 74 y.o.   MRN: [id number]  Cough This is a recurrent problem. The current episode started more than 1 year ago. The problem has been gradually worsening. The problem occurs every few hours. The cough is productive of blood-tinged sputum. Associated symptoms include weight loss. The symptoms are aggravated by lying down. She has tried body position changes for the symptoms. The treatment provided no relief. Her past medical history is significant for bronchiectasis.   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 She first started on therapy with clarithromycin rifampin and ethambutol 3 days per week. She had great difficulty tolerating the Biaxin which was she was taking it twice daily doses on the day she was taking it was reduced to once daily dosing on July 01 2012 Apparently at that time there was concern about toxicity although she states that now that she was found to need simply new hearing aids. Initially while on therapy for Mycobacterium avium showed mprovement in her symptoms of chronic daily cough and sputum production.   In Fall of 2013 while [place] 2013 apparently she had episodes of hemoptysis and was hospitalized due to Hospital in [place] where she was placed in isolation for rule out tuberculosis. Ultimately was realized that she was suffering from Mycobacterium avium infection alone.  . She had repeat sputum taken Spring of 2014 and again grow Mycobacterium avium. The organism was sent for susceptibility testing and showed macrolide sensitivity and in vitro synergy with rifampin and ETH in inhibiting growth in lab. Amikacin would also be considered active at S she had.  I had Changed her to  daily therapy with macrolide (Azithromycin) ethambutol and rifampin. Since change she had been initiallycoughing less, she was gaining weight and was   absent fevers.   She saw Dr. Annamaria Boots in November 2014 when she reported 1 month she is coughing up more yellow/liquid phlem since on Rafampin. Chest x-ray was done and showed no changes. Sputum was sent for routine culture which only isolated normal oral pharyngeal flora. AFB culture is without AFB seen on smear blood cultures in November DID grow M avium again   I had tried to get her on daily RIFABUTIN with continue azithro and ETH but she did not tolerate at all with severe anorexia, and apparent visual problems, stating that "I could not see for several hours."  She went back to Rifampin TIW,  azithro 500mg  daily, ETH 700mg  TU, TH< SA< SU  In the interim she was seen by her Cardiologist Dr. Sallyanne Kuster  who saw her in October and found NEW  deep symmetrical inverted T waves in all the inferior leads as well as V3-V6. The QTC prolongation on EKG that persistd on followup EKG. he was concern for possible antibiotic induced myocarditis and the patient stopped her anti-Mycobacterium drugs and had MRI of her heart performed which was normal echocardiogram was also normal.  Repeat EKG done in the next few days showed resolution of T wave changes and improvement in QT  Since coming off her anti-Mycobacterium avium drugs she has continued to cough on a daily basis   She had CXR that showed expansion of cavitary disease though most recent CXR showed stable findings.   Note her M avium was R to Moxifloxacin with MIC of 4.  It was I to linezolid with MIC of 16  It  was S to clarithro with MIC of 2  Further MICs were:  Amikacin 16 Cipro 16 Ethambutol 8 Ethimodate > INH 8 Rifampin >8 Rifabutin 0.5 Streptomcin 32  Combination of ETH with clarithro, erythrom, rifampin, rifabutin all produced no growth in vitro  See below      She has come to our clinic multiple times this Spring with worsening cough, with blood tinged sputum and actual hemoptysis, fatigue, lack of energy.  I corresponded  with Dr Lorenda Cahill via email and he recommended not starting any therapy until he had a chance to review her case himself when she comes to visit him. She is on schedule to see him in August.  She was worked into clinic at last visit with worsening blood tinged sputum and we ultimately decided upon consultation with Dr Lorenda Cahill and with Dr Sallyanne Kuster to re-challenge the patient with Azithro and [facility] which we did and there have been no EKG changes so far to suggest pericarditis , myocarditis or issues with QT prolongation.  Since she started on meds she has had improvement in sputum, no longer blood tinged and she is now able to sleep. She still does cough but quality of life has improved  Review of Systems  Constitutional: Positive for weight loss. Negative for diaphoresis, activity change, appetite change, fatigue and unexpected weight change.  HENT: Negative for congestion, sinus pressure, sneezing and trouble swallowing.   Eyes: Negative for photophobia and visual disturbance.  Respiratory: Positive for cough. Negative for stridor.   Cardiovascular: Negative for palpitations and leg swelling.  Gastrointestinal: Negative for vomiting, abdominal pain, diarrhea, constipation, blood in stool, abdominal distention and anal bleeding.  Genitourinary: Negative for dysuria, hematuria, flank pain and difficulty urinating.  Musculoskeletal: Negative for back pain, joint swelling, arthralgias and gait problem.  Skin: Negative for color change, pallor and wound.  Neurological: Negative for dizziness, tremors, weakness and light-headedness.  Hematological: Negative for adenopathy. Does not bruise/bleed easily.  Psychiatric/Behavioral: Negative for behavioral problems, sleep disturbance, dysphoric mood, decreased concentration and agitation.       Objective:   Physical Exam  Constitutional: She is oriented to person, place, and time. She appears well-developed and well-nourished. No distress.  HENT:  Head:  Normocephalic and atraumatic.  Mouth/Throat: No oropharyngeal exudate.  Eyes: Conjunctivae and EOM are normal. No scleral icterus.  Neck: Normal range of motion. Neck supple.  Cardiovascular: Normal rate, regular rhythm and normal heart sounds.  Exam reveals no gallop and no friction rub.   No murmur heard. Pulmonary/Chest: Effort normal. No respiratory distress. She has no wheezes.  Crackles at bases, prolonged expiratory phase but more clear than last exam  Abdominal: She exhibits no distension.  Musculoskeletal: She exhibits no edema or tenderness.  Neurological: She is alert and oriented to person, place, and time. She exhibits normal muscle tone. Coordination normal.  Skin: Skin is warm and dry. No rash noted. She is not diaphoretic. No erythema. No pallor.  Psychiatric: She has a normal mood and affect. Her behavior is normal. Judgment and thought content normal.          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  Would consider trying to procure clofazimine but would like pt seen by Dr Lorenda Cahill and also greater certainty she could adhere to this medication   I spent greater than 25 minutes with the patient  including greater than 50% of time in face to face counsel of the patient and in coordination of their care.   #2 Abnormal EKG :see above discssuion  #3 Bronchiectasis: due to #1 with cavity: there is risk of fungal infection esp aspergilloma in this cavity to be need to be watched carefully.  #4 HTN: not well controlled  At all when she comes to our clinic but claims her BP is "fine" when she checks it at home MAvium causing much misery, der to primary care MD.

## 2014-07-14 NOTE — Patient Instructions (Signed)
Continue same medications   Repeat EKG in 2 weeks  07/28/14 at 10:00 am

## 2014-07-30 DIAGNOSIS — A310 Pulmonary mycobacterial infection: Secondary | ICD-10-CM | POA: Insufficient documentation

## 2014-08-03 ENCOUNTER — Ambulatory Visit (INDEPENDENT_AMBULATORY_CARE_PROVIDER_SITE_OTHER): Payer: Medicare Other | Admitting: *Deleted

## 2014-08-03 DIAGNOSIS — R9431 Abnormal electrocardiogram [ECG] [EKG]: Secondary | ICD-10-CM

## 2014-08-03 DIAGNOSIS — I4581 Long QT syndrome: Secondary | ICD-10-CM

## 2014-08-03 DIAGNOSIS — I10 Essential (primary) hypertension: Secondary | ICD-10-CM | POA: Diagnosis not present

## 2014-08-03 DIAGNOSIS — I514 Myocarditis, unspecified: Secondary | ICD-10-CM

## 2014-08-03 DIAGNOSIS — T50905A Adverse effect of unspecified drugs, medicaments and biological substances, initial encounter: Secondary | ICD-10-CM

## 2014-08-03 NOTE — Progress Notes (Signed)
Patient into office today for 2 week EKG. Patient has taken Azithromycin recently  Patient states Dr Sallyanne Kuster is doing EKG for. the doctors at [facility].  EKG was reviewed by Dr Stanford Breed. Patient request a copy of EKG to take with her to [facility].

## 2014-08-03 NOTE — Patient Instructions (Signed)
EKG okay per  Dr Stanford Breed  EKG copy given to patient

## 2014-08-05 DIAGNOSIS — H6123 Impacted cerumen, bilateral: Secondary | ICD-10-CM | POA: Diagnosis not present

## 2014-08-09 ENCOUNTER — Telehealth: Payer: Self-pay | Admitting: Cardiovascular Disease

## 2014-08-09 DIAGNOSIS — A310 Pulmonary mycobacterial infection: Secondary | ICD-10-CM | POA: Diagnosis not present

## 2014-08-09 NOTE — Telephone Encounter (Signed)
Received records from Dr [facility] for appointment on 08/23/14 with Dr Sallyanne Kuster.  Records given to [facility] (medical records) for Dr Croitoru's schedule on 08/23/14. lp

## 2014-08-12 ENCOUNTER — Other Ambulatory Visit: Payer: Self-pay | Admitting: *Deleted

## 2014-08-12 ENCOUNTER — Telehealth: Payer: Self-pay | Admitting: *Deleted

## 2014-08-12 ENCOUNTER — Ambulatory Visit (INDEPENDENT_AMBULATORY_CARE_PROVIDER_SITE_OTHER): Payer: Medicare Other | Admitting: Licensed Clinical Social Worker

## 2014-08-12 DIAGNOSIS — Z23 Encounter for immunization: Secondary | ICD-10-CM

## 2014-08-12 DIAGNOSIS — A310 Pulmonary mycobacterial infection: Secondary | ICD-10-CM

## 2014-08-12 MED ORDER — AZITHROMYCIN 250 MG PO TABS: 250.0000 mg | ORAL_TABLET | Freq: Every day | ORAL | Status: DC

## 2014-08-12 MED ORDER — ETHAMBUTOL HCL 400 MG PO TABS: 800.0000 mg | ORAL_TABLET | Freq: Every day | ORAL | Status: DC

## 2014-08-12 NOTE — Telephone Encounter (Signed)
Pt was told by Dr. Lorenda Cahill at [facility] that she would need to receive 2 vaccines prior to having lung surgery performed by Dr. Abigail Butts.  Pt requesting Pneumovax23 and Tdap vaccines on different days.  Scheduled pt for these vaccines.

## 2014-08-12 NOTE — Telephone Encounter (Signed)
She is having lung surgery?? wow

## 2014-08-16 ENCOUNTER — Ambulatory Visit (INDEPENDENT_AMBULATORY_CARE_PROVIDER_SITE_OTHER): Payer: Medicare Other | Admitting: *Deleted

## 2014-08-16 DIAGNOSIS — Z23 Encounter for immunization: Secondary | ICD-10-CM

## 2014-08-23 ENCOUNTER — Encounter: Payer: Self-pay | Admitting: Cardiovascular Disease

## 2014-08-23 ENCOUNTER — Ambulatory Visit (INDEPENDENT_AMBULATORY_CARE_PROVIDER_SITE_OTHER): Payer: Medicare Other | Admitting: Cardiovascular Disease

## 2014-08-23 VITALS — BP 148/88 | HR 84 | Resp 24 | Ht 59.0 in | Wt 101.5 lb

## 2014-08-23 DIAGNOSIS — A310 Pulmonary mycobacterial infection: Secondary | ICD-10-CM | POA: Diagnosis not present

## 2014-08-23 DIAGNOSIS — Z8679 Personal history of other diseases of the circulatory system: Secondary | ICD-10-CM | POA: Diagnosis not present

## 2014-08-23 DIAGNOSIS — I10 Essential (primary) hypertension: Secondary | ICD-10-CM | POA: Diagnosis not present

## 2014-08-23 NOTE — Patient Instructions (Signed)
Dr. Sallyanne Kuster recommends that you schedule a follow-up appointment in: 3-4 months.

## 2014-08-23 NOTE — Progress Notes (Signed)
Patient ID: Mia Hernandez, female   DOB: 04-16-1940, 74 y.o.   MRN: [id number]     Cardiology Office Note   Date:  08/24/2014   ID:  Mia Hernandez, DOB Feb 09, 1940, MRN [id number]  PCP:  Geoffery Lyons, MD  Cardiologist:   Sanda Klein, MD   Chief Complaint  Patient presents with  . Follow-up      History of Present Illness: Mia Hernandez is a 74 y.o. 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 This is complicated by bronchiectasis and cavitary lesions with a chronic cough and previous hemoptysis. She was recently evaluated at [facility] by Dr. Lorenda Cahill and Dr. Elenor Quinones, with a plan for partial pneumonectomy. It is felt that continued antibiotically therapy will be unsuccessful in controlling the infection and will likely be again associated with multiple side effects.  Her myocarditis manifested by traumatic ECG changes with severe QT interval prolongation and ST segment elevation as well as mildly reduced left ventricular systolic function on echocardiography and telling him enhancement on cardiac MRI, all consistent with acute myocarditis. Not long for allowing discontinuation of her antibodies, all the cardiac abnormalities returned to normal. Azithromycin is felt to be the most likely culprit, although she was also taking ethambutol and rifampin at the time.    Past Medical History  Diagnosis Date  . Osteoporosis   . ALLERGIC RHINITIS   . Chronic back pain   . Mycobacterium avium-intracellulare infection   . Bronchiectasis   . 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 Cholecystectomy    . Vaginal hysterectomy    . Laminectomy    . Breast biopsy       Current Outpatient Prescriptions  Medication Sig Dispense Refill  . azithromycin (ZITHROMAX) 250 MG tablet Take 1 tablet (250 mg total) by mouth  daily. 90 each 3  . chlorthalidone (HYGROTON) 25 MG tablet Take 0.5 tablets (12.5 mg total) by mouth 3 (three) times a week. Monday, Wednesday, and Friday 75 tablet 3  . ethambutol (MYAMBUTOL) 400 MG tablet Take 2 tablets (800 mg total) by mouth daily. 180 tablet 3  . valsartan (DIOVAN) 320 MG tablet Take 320 mg by mouth daily.     No current facility-administered medications for this visit.    Allergies:   Latex; Rifabutin; Aspirin; Betadine; Codeine; Iodinated diagnostic agents; Macrolides and ketolides; Morphine and related; Penicillins; Prednisone; and Sulfa antibiotics    Social History:  The patient  reports that she has never smoked. She has never used smokeless tobacco. She reports that she drinks about 0.6 oz of alcohol per week. She reports that she does not use illicit drugs.   Family History:  The patient's family history includes Breast cancer in her mother; Pulmonary embolism in her brother; Stroke in her father.    ROS:  Please see the history of present illness.    Otherwise, review of systems positive for cough, poor appetite, no recent hemoptysis.   All other systems are reviewed and negative.    PHYSICAL EXAM: VS:  BP 148/88 mmHg  Pulse 84  Resp 24  Ht 4\' 11"  (1.499 m)  Wt 101 lb 8 oz (46.04 kg)  BMI 20.49 kg/m2 , BMI Body mass index is 20.49 kg/(m^2).  BP recheck was 140/84. General: Alert, oriented x3, no distress Head: no evidence of trauma, PERRL, EOMI, no exophtalmos or lid lag, no  myxedema, no xanthelasma; normal ears, nose and oropharynx Neck: normal jugular venous pulsations and no hepatojugular reflux; brisk carotid pulses without delay and no carotid bruits Chest: clear to auscultation, no signs of consolidation by percussion or palpation, normal fremitus, symmetrical and full respiratory excursions Cardiovascular: normal position and quality of the apical impulse, regular rhythm, normal first and second heart sounds, no murmurs, rubs or  gallops Abdomen: no tenderness or distention, no masses by palpation, no abnormal pulsatility or arterial bruits, normal bowel sounds, no hepatosplenomegaly Extremities: no clubbing, cyanosis or edema; 2+ radial, ulnar and brachial pulses bilaterally; 2+ right femoral, posterior tibial and dorsalis pedis pulses; 2+ left femoral, posterior tibial and dorsalis pedis pulses; no subclavian or femoral bruits Neurological: grossly nonfocal Psych: euthymic mood, full affect  EKG:  EKG is ordered today. The ekg ordered today demonstrates normal sinus rhythm, normal QT interval 437 ms   Recent Labs: 10/22/2013: ALT 21; Magnesium 2.1 05/03/2014: BUN 11; Creat 0.58; Potassium 4.1; Sodium 130* 06/11/2014: Hemoglobin 14.3; Platelets 353    Lipid Panel    Component Value Date/Time   CHOL 212* 03/08/2014 0834   TRIG 58 03/08/2014 0834   HDL 120 03/08/2014 0834   CHOLHDL 1.8 03/08/2014 0834   VLDL 12 03/08/2014 0834   LDLCALC 80 03/08/2014 0834      Wt Readings from Last 3 Encounters:  08/23/14 101 lb 8 oz (46.04 kg)  07/14/14 104 lb 7 oz (47.373 kg)  07/14/14 103 lb (46.72 kg)      ASSESSMENT AND PLAN:  1. Acute myocarditis/Microlite toxicity, resolved No evidence of cardiotoxicity on current dose of azithromycin  2. Systemic hypertension Normal blood pressure at home, episodic increase associated with emotional distress. No change in medications    Current medicines are reviewed at length with the patient today.  The patient does not have concerns regarding medicines.  The following changes have been made:  no change  Labs/ tests ordered today include:  Orders Placed This Encounter  Procedures  . EKG 12-Lead     Patient Instructions  Dr. Sallyanne Kuster recommends that you schedule a follow-up appointment in: 3-4 months.    Mikael Spray, MD  08/24/2014 4:26 PM    Sanda Klein, MD, [facility] [phone] office [phone] pager

## 2014-08-24 DIAGNOSIS — J470 Bronchiectasis with acute lower respiratory infection: Secondary | ICD-10-CM | POA: Diagnosis not present

## 2014-08-24 DIAGNOSIS — J471 Bronchiectasis with (acute) exacerbation: Secondary | ICD-10-CM | POA: Diagnosis not present

## 2014-08-24 DIAGNOSIS — Z8619 Personal history of other infectious and parasitic diseases: Secondary | ICD-10-CM | POA: Diagnosis not present

## 2014-08-24 DIAGNOSIS — A310 Pulmonary mycobacterial infection: Secondary | ICD-10-CM | POA: Diagnosis not present

## 2014-08-24 DIAGNOSIS — R918 Other nonspecific abnormal finding of lung field: Secondary | ICD-10-CM | POA: Diagnosis not present

## 2014-08-27 ENCOUNTER — Encounter: Payer: Self-pay | Admitting: Cardiovascular Disease

## 2014-08-27 NOTE — Telephone Encounter (Signed)
Pt is calling back in wanting to be advised about what to do about her sodium and potassium. Please call  Thanks

## 2014-08-31 ENCOUNTER — Telehealth: Payer: Self-pay | Admitting: Cardiovascular Disease

## 2014-08-31 DIAGNOSIS — E871 Hypo-osmolality and hyponatremia: Secondary | ICD-10-CM

## 2014-08-31 NOTE — Telephone Encounter (Signed)
Mrs.  Munns is calling about a lab order for her to pick up . Please call   Thanks

## 2014-08-31 NOTE — Telephone Encounter (Signed)
Spoke with pt, order for BMP placed at the front desk for patient pick up. See email correspondence

## 2014-09-01 ENCOUNTER — Telehealth: Payer: Self-pay | Admitting: *Deleted

## 2014-09-01 NOTE — Telephone Encounter (Signed)
Patient has lab order for a BMP in one week after being off the chlorthalidone.  Surgery has been rescheduled for 09/27/14.  Will send BP readings at the end of the week.

## 2014-09-01 NOTE — Telephone Encounter (Signed)
-----   Message from Sanda Klein, MD sent at 08/28/2014 11:38 AM EDT ----- Instructed to stop chlorthalidone due to lab abnormalities at [facility] Please recheck BMET after one week off chlorthalidone and check BP once daily and send records end of next week

## 2014-09-03 ENCOUNTER — Encounter: Payer: Self-pay | Admitting: Cardiovascular Disease

## 2014-09-15 ENCOUNTER — Encounter: Payer: Self-pay | Admitting: Infectious Disease

## 2014-09-15 ENCOUNTER — Ambulatory Visit (INDEPENDENT_AMBULATORY_CARE_PROVIDER_SITE_OTHER): Payer: Medicare Other | Admitting: Infectious Disease

## 2014-09-15 VITALS — BP 172/110 | HR 91 | Temp 98.2°F | Wt 102.0 lb

## 2014-09-15 DIAGNOSIS — I4581 Long QT syndrome: Secondary | ICD-10-CM | POA: Diagnosis not present

## 2014-09-15 DIAGNOSIS — A439 Nocardiosis, unspecified: Secondary | ICD-10-CM | POA: Diagnosis not present

## 2014-09-15 DIAGNOSIS — A310 Pulmonary mycobacterial infection: Secondary | ICD-10-CM | POA: Diagnosis not present

## 2014-09-15 DIAGNOSIS — J471 Bronchiectasis with (acute) exacerbation: Secondary | ICD-10-CM | POA: Diagnosis not present

## 2014-09-15 DIAGNOSIS — T50905A Adverse effect of unspecified drugs, medicaments and biological substances, initial encounter: Secondary | ICD-10-CM

## 2014-09-15 DIAGNOSIS — I514 Myocarditis, unspecified: Secondary | ICD-10-CM | POA: Diagnosis not present

## 2014-09-15 DIAGNOSIS — E871 Hypo-osmolality and hyponatremia: Secondary | ICD-10-CM

## 2014-09-15 DIAGNOSIS — I10 Essential (primary) hypertension: Secondary | ICD-10-CM | POA: Diagnosis not present

## 2014-09-15 DIAGNOSIS — I6782 Cerebral ischemia: Secondary | ICD-10-CM | POA: Insufficient documentation

## 2014-09-15 DIAGNOSIS — M1990 Unspecified osteoarthritis, unspecified site: Secondary | ICD-10-CM | POA: Insufficient documentation

## 2014-09-15 DIAGNOSIS — R9431 Abnormal electrocardiogram [ECG] [EKG]: Secondary | ICD-10-CM

## 2014-09-15 DIAGNOSIS — G939 Disorder of brain, unspecified: Secondary | ICD-10-CM | POA: Insufficient documentation

## 2014-09-15 HISTORY — DX: Hypo-osmolality and hyponatremia: E87.1

## 2014-09-15 HISTORY — DX: Nocardiosis, unspecified: A43.9

## 2014-09-15 LAB — COMPREHENSIVE METABOLIC PANEL
ALT: 16 U/L (ref 6–29)
AST: 30 U/L (ref 10–35)
Albumin: 4.5 g/dL (ref 3.6–5.1)
Alkaline Phosphatase: 62 U/L (ref 33–130)
BUN: 8 mg/dL (ref 7–25)
CHLORIDE: 95 mmol/L — AB (ref 98–110)
CO2: 28 mmol/L (ref 20–31)
CREATININE: 0.55 mg/dL — AB (ref 0.60–0.93)
Calcium: 9.3 mg/dL (ref 8.6–10.4)
GLUCOSE: 95 mg/dL (ref 65–99)
Potassium: 4.1 mmol/L (ref 3.5–5.3)
SODIUM: 131 mmol/L — AB (ref 135–146)
TOTAL PROTEIN: 7.3 g/dL (ref 6.1–8.1)
Total Bilirubin: 0.7 mg/dL (ref 0.2–1.2)

## 2014-09-15 LAB — CBC WITH DIFFERENTIAL/PLATELET
BASOS PCT: 0 % (ref 0–1)
Basophils Absolute: 0 10*3/uL (ref 0.0–0.1)
EOS ABS: 0.1 10*3/uL (ref 0.0–0.7)
Eosinophils Relative: 2 % (ref 0–5)
HCT: 39.8 % (ref 36.0–46.0)
Hemoglobin: 13.9 g/dL (ref 12.0–15.0)
Lymphocytes Relative: 30 % (ref 12–46)
Lymphs Abs: 2.2 10*3/uL (ref 0.7–4.0)
MCH: 32.1 pg (ref 26.0–34.0)
MCHC: 34.9 g/dL (ref 30.0–36.0)
MCV: 91.9 fL (ref 78.0–100.0)
MONOS PCT: 13 % — AB (ref 3–12)
MPV: 9 fL (ref 8.6–12.4)
Monocytes Absolute: 0.9 10*3/uL (ref 0.1–1.0)
NEUTROS PCT: 55 % (ref 43–77)
Neutro Abs: 4 10*3/uL (ref 1.7–7.7)
PLATELETS: 283 10*3/uL (ref 150–400)
RBC: 4.33 MIL/uL (ref 3.87–5.11)
RDW: 14.2 % (ref 11.5–15.5)
WBC: 7.3 10*3/uL (ref 4.0–10.5)

## 2014-09-15 LAB — C-REACTIVE PROTEIN

## 2014-09-15 NOTE — Progress Notes (Signed)
Subjective:   Chief complaint: "cough" and followup for her known M avium infection and now new problem of Nocardia infection    Patient ID: Mia Hernandez, female    DOB: [birth year], 74 y.o.   MRN: [id number]  Cough This is a recurrent problem. The current episode started more than 1 year ago. The problem has been gradually worsening. The problem occurs every few hours. The cough is productive of blood-tinged sputum. Associated symptoms include weight loss. The symptoms are aggravated by lying down. She has tried body position changes for the symptoms. The treatment provided no relief. Her past medical history is significant for bronchiectasis.   74 year old lady with past medical history significant for diagnosis of Mycobacterium avium infection of the lungs l  Interval History:    She first started on therapy with clarithromycin rifampin and ethambutol 3 days per week. She had great difficulty tolerating the Biaxin which was she was taking it twice daily doses on the day she was taking it was reduced to once daily dosing on July 01 2012 Apparently at that time there was concern about toxicity although she states that now that she was found to need simply new hearing aids. Initially while on therapy for Mycobacterium avium showed mprovement in her symptoms of chronic daily cough and sputum production.   In Fall of 2013 while [place] 2013 apparently she had episodes of hemoptysis and was hospitalized due to Hospital in [place] where she was placed in isolation for rule out tuberculosis. Ultimately was realized that she was suffering from Mycobacterium avium infection alone.  She had repeat sputum taken Spring of 2014 and again grow Mycobacterium avium. The organism was sent for susceptibility testing and showed macrolide sensitivity and in vitro synergy with rifampin and ETH in inhibiting growth in lab. Amikacin would also be considered active at S she had.  I had Changed her to  daily therapy  with macrolide (Azithromycin) ethambutol and rifampin. Since change she had been initiallycoughing less, she was gaining weight and was  absent fevers.   She saw Dr. Annamaria Boots in November 2014 when she reported 1 month she is coughing up more yellow/liquid phlem since on Rafampin. Chest x-ray was done and showed no changes. Sputum was sent for routine culture which only isolated normal oral pharyngeal flora. AFB culture is without AFB seen on smear blood cultures in November DID grow M avium again   I had tried to get her on daily RIFABUTIN with continue azithro and ETH but she did not tolerate at all with severe anorexia, and apparent visual problems, stating that "I could not see for several hours."  She went back to Rifampin TIW,  azithro 500mg  daily, ETH 700mg  TU, TH< SA< SU  In the interim she was seen by her Cardiologist Dr. Sallyanne Kuster  who saw her in October and found NEW  deep symmetrical inverted T waves in all the inferior leads as well as V3-V6. The QTC prolongation on EKG that persistd on followup EKG. he was concern for possible antibiotic induced myocarditis and the patient stopped her anti-Mycobacterium drugs and had MRI of her heart performed which was normal echocardiogram was also normal.  Repeat EKG done in the next few days showed resolution of T wave changes and improvement in QT  Since coming off her anti-Mycobacterium avium drugs she had continued to cough on a daily basis   She had CXR that showed expansion of cavitary disease though most recent CXR showed stable findings.   Note  her M avium was R to Moxifloxacin with MIC of 4.  It was I to linezolid with MIC of 16  It was S to clarithro with MIC of 2  Further MICs were:  Amikacin 16 Cipro 16 Ethambutol 8 Ethimodate > INH 8 Rifampin >8 Rifabutin 0.5 Streptomcin 32  Combination of ETH with clarithro, erythrom, rifampin, rifabutin all produced no growth in vitro  See below      She cameto our clinic multiple  times this Spring 2016 with worsening cough, with blood tinged sputum and actual hemoptysis, fatigue, lack of energy.  I corresponded with Dr Lorenda Cahill via email and he recommended not starting any therapy until he had a chance to review her case himself when she comes to visit him. She is on schedule to see him in August.  She was worked into clinic before seeing Dr. Lorenda Cahill with  worsening blood tinged sputum and we ultimately decided upon consultation with Dr Lorenda Cahill and with Dr Sallyanne Kuster to re-challenge the patient with Azithro and [facility] which we did and there have been no EKG changes so far to suggest pericarditis , myocarditis or issues with QT prolongation.  Since she started on meds she has had improvement in sputum, no longer blood tinged and she is now able to sleep.  She has now seen Dr Lorenda Cahill who collected sputum sample (though I cannot access it in [place]). Dr. Lorenda Cahill felt upon review of her films that her pulmonary disease was amenable to combined medical and surgical approach with resection of large cavitary areas from lungs to be considered by Dr. Elenor Quinones at [facility]. Surgery is scheduled for later this month.  In the interm she gre Nocardia species from sputum and has been started on zyvox which she is tolerating well.  She is also worried about hyponatremia she developed she believes due to a prior diuretic and requests [place] to check BMP as well as her LFTs.  She is stilll coughing but MUCH less and feesl MUCH better.   Past Medical History  Diagnosis Date  . Osteoporosis   . ALLERGIC RHINITIS   . Chronic back pain   . Mycobacterium avium-intracellulare infection   . Bronchiectasis   . 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 Hyponatremia 09/15/2014   Past Surgical History  Procedure Laterality Date  . Cholecystectomy    . Vaginal hysterectomy    . Laminectomy    . Breast biopsy     Family History  Problem Relation Age of Onset  . Breast cancer  Mother   . Stroke Father   . Pulmonary embolism Brother    Social History  Substance Use Topics  . Smoking status: Never Smoker   . Smokeless tobacco: Never Used  . Alcohol Use: 0.6 oz/week    1 Glasses of wine per week     Comment: at dinner     Review of Systems  Constitutional: Positive for weight loss. Negative for diaphoresis, activity change, appetite change, fatigue and unexpected weight change.  HENT: Negative for congestion, sinus pressure, sneezing and trouble swallowing.   Eyes: Negative for photophobia and visual disturbance.  Respiratory: Positive for cough. Negative for stridor.   Cardiovascular: Negative for palpitations and leg swelling.  Gastrointestinal: Negative for vomiting, abdominal pain, diarrhea, constipation, blood in stool, abdominal distention and anal bleeding.  Genitourinary: Negative for dysuria, hematuria, flank pain and difficulty urinating.  Musculoskeletal: Negative for back pain, joint swelling, arthralgias and gait problem.  Skin: Negative for  color change, pallor and wound.  Neurological: Negative for dizziness, tremors, weakness and light-headedness.  Hematological: Negative for adenopathy. Does not bruise/bleed easily.  Psychiatric/Behavioral: Negative for behavioral problems, sleep disturbance, dysphoric mood, decreased concentration and agitation.       Objective:   Physical Exam  Constitutional: She is oriented to person, place, and time. She appears well-developed and well-nourished. No distress.  HENT:  Head: Normocephalic and atraumatic.  Mouth/Throat: No oropharyngeal exudate.  Eyes: Conjunctivae and EOM are normal. No scleral icterus.  Neck: Normal range of motion. Neck supple.  Cardiovascular: Normal rate, regular rhythm and normal heart sounds.  Exam reveals no gallop and no friction rub.   No murmur heard. Pulmonary/Chest: Effort normal. No respiratory distress. She has no wheezes. She has rales.  Crackles at bases, prolonged  expiratory phase but more clear than last exam  Abdominal: She exhibits no distension.  Musculoskeletal: She exhibits no edema or tenderness.  Neurological: She is alert and oriented to person, place, and time. She exhibits normal muscle tone. Coordination normal.  Skin: Skin is warm and dry. No rash noted. She is not diaphoretic. No erythema. No pallor.  Psychiatric: She has a normal mood and affect. Her behavior is normal. Judgment and thought content normal.          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 Elenor Quinones. I GREATLY appreciate Dr. Lorenda Cahill and Dr. Florentina Jenny help with this challenging situtation.  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 I would like to have records from Dr. Lorenda Cahill as far as the culture, ID and sensitivity of this species. She is on zyvox for now. Will check CBC but she has only been on for 3 days  #3 Abnormal EKG : ? Of drug induced vs idiopathic pericarditis, myocarditis she has had no new findings on current regimen and followed by Dr. Orene Desanctis.  #4 Bronchiectasis: see above she is to have cavity resected by Dr. Elenor Quinones.  #5 HTN: not well controlled AGAIN  #6 Hyponatremia: will check CMP for patient and review  I independently reviewed her chest xrays, EKGs and labs.   I spent greater than 25 minutes with the patient including greater than 50% of time in face to face counsel of the patient re her M avium infection, her new Nocardia infection, her hyponatremia  Her bronchiectasis with cavitary lung disease, pericarditis and in coordination of their care.

## 2014-09-15 NOTE — Addendum Note (Signed)
Addended by: Landis Gandy on: 09/15/2014 03:59 PM   Modules accepted: Orders

## 2014-09-16 LAB — SEDIMENTATION RATE: SED RATE: 6 mm/h (ref 0–30)

## 2014-09-27 DIAGNOSIS — R410 Disorientation, unspecified: Secondary | ICD-10-CM | POA: Diagnosis not present

## 2014-09-27 DIAGNOSIS — A319 Mycobacterial infection, unspecified: Secondary | ICD-10-CM | POA: Diagnosis not present

## 2014-09-27 DIAGNOSIS — E871 Hypo-osmolality and hyponatremia: Secondary | ICD-10-CM | POA: Diagnosis not present

## 2014-09-27 DIAGNOSIS — M1389 Other specified arthritis, multiple sites: Secondary | ICD-10-CM | POA: Diagnosis present

## 2014-09-27 DIAGNOSIS — G8918 Other acute postprocedural pain: Secondary | ICD-10-CM | POA: Diagnosis not present

## 2014-09-27 DIAGNOSIS — A499 Bacterial infection, unspecified: Secondary | ICD-10-CM | POA: Diagnosis not present

## 2014-09-27 DIAGNOSIS — F05 Delirium due to known physiological condition: Secondary | ICD-10-CM | POA: Diagnosis not present

## 2014-09-27 DIAGNOSIS — R918 Other nonspecific abnormal finding of lung field: Secondary | ICD-10-CM | POA: Diagnosis not present

## 2014-09-27 DIAGNOSIS — G8912 Acute post-thoracotomy pain: Secondary | ICD-10-CM | POA: Diagnosis not present

## 2014-09-27 DIAGNOSIS — J90 Pleural effusion, not elsewhere classified: Secondary | ICD-10-CM | POA: Diagnosis not present

## 2014-09-27 DIAGNOSIS — J982 Interstitial emphysema: Secondary | ICD-10-CM | POA: Diagnosis not present

## 2014-09-27 DIAGNOSIS — J939 Pneumothorax, unspecified: Secondary | ICD-10-CM | POA: Diagnosis not present

## 2014-09-27 DIAGNOSIS — T40695A Adverse effect of other narcotics, initial encounter: Secondary | ICD-10-CM | POA: Diagnosis not present

## 2014-09-27 DIAGNOSIS — J8410 Pulmonary fibrosis, unspecified: Secondary | ICD-10-CM | POA: Diagnosis not present

## 2014-09-27 DIAGNOSIS — Z85828 Personal history of other malignant neoplasm of skin: Secondary | ICD-10-CM | POA: Diagnosis not present

## 2014-09-27 DIAGNOSIS — J948 Other specified pleural conditions: Secondary | ICD-10-CM | POA: Diagnosis not present

## 2014-09-27 DIAGNOSIS — A310 Pulmonary mycobacterial infection: Secondary | ICD-10-CM | POA: Diagnosis present

## 2014-09-27 DIAGNOSIS — Z8673 Personal history of transient ischemic attack (TIA), and cerebral infarction without residual deficits: Secondary | ICD-10-CM | POA: Diagnosis not present

## 2014-09-27 DIAGNOSIS — J479 Bronchiectasis, uncomplicated: Secondary | ICD-10-CM | POA: Diagnosis not present

## 2014-09-27 DIAGNOSIS — R110 Nausea: Secondary | ICD-10-CM | POA: Diagnosis not present

## 2014-09-27 DIAGNOSIS — I10 Essential (primary) hypertension: Secondary | ICD-10-CM | POA: Diagnosis present

## 2014-09-27 DIAGNOSIS — J984 Other disorders of lung: Secondary | ICD-10-CM | POA: Diagnosis not present

## 2014-09-27 DIAGNOSIS — A439 Nocardiosis, unspecified: Secondary | ICD-10-CM | POA: Diagnosis not present

## 2014-09-27 DIAGNOSIS — J470 Bronchiectasis with acute lower respiratory infection: Secondary | ICD-10-CM | POA: Diagnosis not present

## 2014-09-27 DIAGNOSIS — A430 Pulmonary nocardiosis: Secondary | ICD-10-CM | POA: Diagnosis present

## 2014-10-04 ENCOUNTER — Ambulatory Visit: Payer: Medicare Other | Admitting: Infectious Disease

## 2014-10-04 DIAGNOSIS — K859 Acute pancreatitis, unspecified: Secondary | ICD-10-CM | POA: Diagnosis not present

## 2014-10-04 DIAGNOSIS — I10 Essential (primary) hypertension: Secondary | ICD-10-CM | POA: Diagnosis not present

## 2014-10-04 DIAGNOSIS — J189 Pneumonia, unspecified organism: Secondary | ICD-10-CM | POA: Diagnosis not present

## 2014-10-04 DIAGNOSIS — J479 Bronchiectasis, uncomplicated: Secondary | ICD-10-CM | POA: Diagnosis not present

## 2014-10-04 DIAGNOSIS — A310 Pulmonary mycobacterial infection: Secondary | ICD-10-CM | POA: Diagnosis not present

## 2014-10-04 DIAGNOSIS — Z48813 Encounter for surgical aftercare following surgery on the respiratory system: Secondary | ICD-10-CM | POA: Diagnosis not present

## 2014-10-11 DIAGNOSIS — J189 Pneumonia, unspecified organism: Secondary | ICD-10-CM | POA: Diagnosis not present

## 2014-10-11 DIAGNOSIS — J479 Bronchiectasis, uncomplicated: Secondary | ICD-10-CM | POA: Diagnosis not present

## 2014-10-11 DIAGNOSIS — K859 Acute pancreatitis, unspecified: Secondary | ICD-10-CM | POA: Diagnosis not present

## 2014-10-11 DIAGNOSIS — A310 Pulmonary mycobacterial infection: Secondary | ICD-10-CM | POA: Diagnosis not present

## 2014-10-11 DIAGNOSIS — I10 Essential (primary) hypertension: Secondary | ICD-10-CM | POA: Diagnosis not present

## 2014-10-11 DIAGNOSIS — Z48813 Encounter for surgical aftercare following surgery on the respiratory system: Secondary | ICD-10-CM | POA: Diagnosis not present

## 2014-10-13 ENCOUNTER — Ambulatory Visit (INDEPENDENT_AMBULATORY_CARE_PROVIDER_SITE_OTHER): Payer: Medicare Other | Admitting: Infectious Disease

## 2014-10-13 ENCOUNTER — Encounter: Payer: Self-pay | Admitting: Infectious Disease

## 2014-10-13 VITALS — BP 162/91 | HR 89 | Temp 97.9°F | Wt 98.2 lb

## 2014-10-13 DIAGNOSIS — I514 Myocarditis, unspecified: Secondary | ICD-10-CM | POA: Diagnosis not present

## 2014-10-13 DIAGNOSIS — A310 Pulmonary mycobacterial infection: Secondary | ICD-10-CM | POA: Diagnosis present

## 2014-10-13 DIAGNOSIS — T50905A Adverse effect of unspecified drugs, medicaments and biological substances, initial encounter: Secondary | ICD-10-CM

## 2014-10-13 DIAGNOSIS — R1310 Dysphagia, unspecified: Secondary | ICD-10-CM | POA: Diagnosis not present

## 2014-10-13 DIAGNOSIS — J471 Bronchiectasis with (acute) exacerbation: Secondary | ICD-10-CM | POA: Diagnosis not present

## 2014-10-13 DIAGNOSIS — A439 Nocardiosis, unspecified: Secondary | ICD-10-CM

## 2014-10-13 DIAGNOSIS — I309 Acute pericarditis, unspecified: Secondary | ICD-10-CM | POA: Diagnosis not present

## 2014-10-13 DIAGNOSIS — I4581 Long QT syndrome: Secondary | ICD-10-CM | POA: Diagnosis not present

## 2014-10-13 DIAGNOSIS — T797XXA Traumatic subcutaneous emphysema, initial encounter: Secondary | ICD-10-CM

## 2014-10-13 DIAGNOSIS — R9431 Abnormal electrocardiogram [ECG] [EKG]: Secondary | ICD-10-CM

## 2014-10-13 HISTORY — DX: Dysphagia, unspecified: R13.10

## 2014-10-13 HISTORY — DX: Traumatic subcutaneous emphysema, initial encounter: T79.7XXA

## 2014-10-13 NOTE — Progress Notes (Signed)
[facility]    Chief complaint: subcutanous emphysema, dysphagia and followup for her known M avium infection and  Nocardia infection  Subjective:      Patient ID: Mia Hernandez, female    DOB: 19-Apr-1940, 74 y.o.   MRN: [id number]  HPI 74 year old lady with past medical history significant for diagnosis of Mycobacterium avium infection of the lungs, recent Nocardia  INTERVAL HiSTORY:     Started on therapy with clarithromycin rifampin and ethambutol 3 days per week. She had great difficulty tolerating the Biaxin which was she was taking it twice daily doses on the day she was taking it was reduced to once daily dosing on July 01 2012 Apparently at that time there was concern about toxicity although she states that now that she was found to need simply new hearing aids. Initially while on therapy for Mycobacterium avium showed mprovement in her symptoms of chronic daily cough and sputum production.   In Fall of 2013 while [place] 2013 apparently she had episodes of hemoptysis and was hospitalized due to Hospital in [place] where she was placed in isolation for rule out tuberculosis. Ultimately was realized that she was suffering from Mycobacterium avium infection alone.  She had repeat sputum taken Spring of 2014 and again grow Mycobacterium avium. The organism was sent for susceptibility testing and showed macrolide sensitivity and in vitro synergy with rifampin and ETH in inhibiting growth in lab. Amikacin would also be considered active at S she had.  I had Changed her to  daily therapy with macrolide (Azithromycin) ethambutol and rifampin. Since change she had been initiallycoughing less, she was gaining weight and was  absent fevers.   She saw Dr. Annamaria Boots in November 2014 when she reported 1 month she is coughing up more yellow/liquid phlem since on Rafampin. Chest x-ray was done and showed no changes. Sputum was sent for routine culture which only  isolated normal oral pharyngeal flora. AFB culture is without AFB seen on smear blood cultures in November DID grow M avium again   I had tried to get her on daily RIFABUTIN with continue azithro and ETH but she did not tolerate at all with severe anorexia, and apparent visual problems, stating that "I could not see for several hours."  She went back to Rifampin TIW,  azithro 500mg  daily, ETH 700mg  TU, TH< SA< SU  In the interim she was seen by her Cardiologist Dr. Sallyanne Kuster  who saw her in October and found NEW  deep symmetrical inverted T waves in all the inferior leads as well as V3-V6. The QTC prolongation on EKG that persistd on followup EKG. he was concern for possible antibiotic induced myocarditis and the patient stopped her anti-Mycobacterium drugs and had MRI of her heart performed which was normal echocardiogram was also normal.  Repeat EKG done in the next few days showed resolution of T wave changes and improvement in QT  Since coming off her anti-Mycobacterium avium drugs she had continued to cough on a daily basis   She had CXR that showed expansion of cavitary disease though most recent CXR showed stable findings.   Note her M avium was R to Moxifloxacin with MIC of 4.  It was I to linezolid with MIC of 16  It was S to clarithro with MIC of 2  Further MICs were:  Amikacin 16 Cipro 16 Ethambutol 8 Ethimodate > INH 8 Rifampin >8 Rifabutin 0.5 Streptomcin 32  Combination of ETH with  clarithro, erythrom, rifampin, rifabutin all produced no growth in vitro  See below      She came to our clinic multiple times this Spring 2016 with worsening cough, with blood tinged sputum and actual hemoptysis, fatigue, lack of energy.  I corresponded with Dr Lorenda Cahill via email and he recommended not starting any therapy until he had a chance to review her case himself when she comes to visit him. She is on schedule to see him in August.  She was worked into clinic before seeing  Dr. Lorenda Cahill with  worsening blood tinged sputum and we ultimately decided upon consultation with Dr Lorenda Cahill and with Dr Sallyanne Kuster to re-challenge the patient with Azithro and [facility] which we did and there have been no EKG changes so far to suggest pericarditis , myocarditis or issues with QT prolongation.  Since she started on meds she has had improvement in sputum, no longer blood tinged and she is now able to sleep.  She has now seen Dr Lorenda Cahill who collected sputum sample (though I cannot access it in [place]). Dr. Lorenda Cahill felt upon review of her films that her pulmonary disease was amenable to combined medical and surgical approach with resection of large cavitary areas from lungs to be considered by Dr. Elenor Quinones at [facility].   In the interm she gre Nocardia species from sputum and has been started on zyvox which she was tolerating  But then developed nose blleeds.  She ultimately underwent surgery at [facility] on 09/27/14 with bronchoscopy, thoracoscopy with removal of 2 lobes of lung. She was changed from zyvox to rocephin (to which her Norardia was S) and has remained on this with plan of 6 weeks of IV abx. It is believed that the infected part of lung was removed with surgery. She also grew M avium from prior culture from Dr.Stout's office.  Her cough has DRAMATICALLY improved postoperatively but she did develop significant postoperative subcutaneous emphysema. She has resumed her azithromycin and ETH. She has had some dysphagia due to subcutaneous emphysema but this is improving      Past Medical History  Diagnosis Date  . Osteoporosis   . ALLERGIC RHINITIS   . Chronic back pain   . Mycobacterium avium-intracellulare infection ([place])   . Bronchiectasis ([place])   . 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 Hyponatremia 09/15/2014   Past Surgical History  Procedure Laterality Date  . Cholecystectomy    . Vaginal hysterectomy    . Laminectomy    . Breast biopsy      Family History  Problem Relation Age of Onset  . Breast cancer Mother   . Stroke Father   . Pulmonary embolism Brother    Social History  Substance Use Topics  . Smoking status: Never Smoker   . Smokeless tobacco: Never Used  . Alcohol Use: 0.6 oz/week    1 Glasses of wine per week     Comment: at dinner     Review of Systems  Constitutional: Negative for diaphoresis, activity change, appetite change, fatigue and unexpected weight change.  HENT: Positive for trouble swallowing. Negative for congestion, sinus pressure and sneezing.   Eyes: Negative for photophobia and visual disturbance.  Respiratory: Negative for stridor.   Cardiovascular: Negative for palpitations and leg swelling.  Gastrointestinal: Negative for vomiting, abdominal pain, diarrhea, constipation, blood in stool, abdominal distention and anal bleeding.  Genitourinary: Negative for dysuria, hematuria, flank pain and difficulty urinating.  Musculoskeletal: Positive for neck pain. Negative  for back pain, joint swelling, arthralgias and gait problem.  Skin: Negative for color change, pallor and wound.  Neurological: Negative for dizziness, tremors, weakness and light-headedness.  Hematological: Negative for adenopathy. Does not bruise/bleed easily.  Psychiatric/Behavioral: Negative for behavioral problems, sleep disturbance, dysphoric mood, decreased concentration and agitation.       Objective:   Physical Exam  Constitutional: She is oriented to person, place, and time. She appears well-developed and well-nourished. No distress.  HENT:  Head: Normocephalic and atraumatic.  Mouth/Throat: No oropharyngeal exudate.  Eyes: Conjunctivae and EOM are normal. No scleral icterus.  Neck: Normal range of motion. Neck supple.    Cardiovascular: Normal rate, regular rhythm and normal heart sounds.  Exam reveals no gallop and no friction rub.   No murmur heard. Pulmonary/Chest: Effort normal. No respiratory distress.  She has no wheezes. She has rales.    Crackles at bases, prolonged expiratory phase but more clear than last exam  Abdominal: She exhibits no distension.  Musculoskeletal: She exhibits no edema or tenderness.  Neurological: She is alert and oriented to person, place, and time. She exhibits normal muscle tone. Coordination normal.  Skin: Skin is warm and dry. No rash noted. She is not diaphoretic. No erythema. No pallor.  Psychiatric: She has a normal mood and affect. Her behavior is normal. Judgment and thought content normal.          Assessment & Plan:   #1 Mycobacterium Avium Intracellulare infection and Nocardia  of the lungs sp resection of TWO lobes of lung    I GREATLY appreciate Dr. Lorenda Cahill and Dr. Florentina Jenny help with this challenging situtation.  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 Sensi are in [place]  Hopefully it was indeed in diseased resected lobes and we can "mop" this up with the IV rocephin rather than needing protracted abx  #3 Abnormal EKG : ? Of drug induced vs idiopathic pericarditis, myocarditis she has had no new findings on current regimen and followed by Dr. Orene Desanctis.  #4  Bronchiectasis: sp lobectomy  #5 Subcutaneous emphysema improving to folowup with Dr. Elenor Quinones  #6 Dysphagia: likely due to #5, no candida  I spent greater than 40 minutes with the patient including greater than 50% of time in face to face counsel of the patient re her M avium infection, her  Nocardia infection,   Her bronchiectasis with cavitary lung disease, pericarditis, subcutaneous emphysema, dysphagia coordination of their care.

## 2014-10-18 DIAGNOSIS — Z48813 Encounter for surgical aftercare following surgery on the respiratory system: Secondary | ICD-10-CM | POA: Diagnosis not present

## 2014-10-18 DIAGNOSIS — J189 Pneumonia, unspecified organism: Secondary | ICD-10-CM | POA: Diagnosis not present

## 2014-10-18 DIAGNOSIS — A310 Pulmonary mycobacterial infection: Secondary | ICD-10-CM | POA: Diagnosis not present

## 2014-10-18 DIAGNOSIS — I10 Essential (primary) hypertension: Secondary | ICD-10-CM | POA: Diagnosis not present

## 2014-10-18 DIAGNOSIS — K859 Acute pancreatitis, unspecified: Secondary | ICD-10-CM | POA: Diagnosis not present

## 2014-10-18 DIAGNOSIS — J479 Bronchiectasis, uncomplicated: Secondary | ICD-10-CM | POA: Diagnosis not present

## 2014-10-25 ENCOUNTER — Encounter: Payer: Self-pay | Admitting: Infectious Disease

## 2014-10-25 DIAGNOSIS — A310 Pulmonary mycobacterial infection: Secondary | ICD-10-CM | POA: Diagnosis not present

## 2014-10-25 DIAGNOSIS — J479 Bronchiectasis, uncomplicated: Secondary | ICD-10-CM | POA: Diagnosis not present

## 2014-10-25 DIAGNOSIS — I10 Essential (primary) hypertension: Secondary | ICD-10-CM | POA: Diagnosis not present

## 2014-10-25 DIAGNOSIS — Z48813 Encounter for surgical aftercare following surgery on the respiratory system: Secondary | ICD-10-CM | POA: Diagnosis not present

## 2014-10-25 DIAGNOSIS — J189 Pneumonia, unspecified organism: Secondary | ICD-10-CM | POA: Diagnosis not present

## 2014-10-25 DIAGNOSIS — K859 Acute pancreatitis, unspecified: Secondary | ICD-10-CM | POA: Diagnosis not present

## 2014-10-26 ENCOUNTER — Telehealth: Payer: Self-pay | Admitting: *Deleted

## 2014-10-26 DIAGNOSIS — Z9889 Other specified postprocedural states: Secondary | ICD-10-CM | POA: Diagnosis not present

## 2014-10-26 DIAGNOSIS — J948 Other specified pleural conditions: Secondary | ICD-10-CM | POA: Diagnosis not present

## 2014-10-26 DIAGNOSIS — T797XXA Traumatic subcutaneous emphysema, initial encounter: Secondary | ICD-10-CM | POA: Diagnosis not present

## 2014-10-26 DIAGNOSIS — Z48813 Encounter for surgical aftercare following surgery on the respiratory system: Secondary | ICD-10-CM | POA: Diagnosis not present

## 2014-10-26 DIAGNOSIS — A310 Pulmonary mycobacterial infection: Secondary | ICD-10-CM | POA: Diagnosis not present

## 2014-10-26 NOTE — Telephone Encounter (Signed)
Patient emailed Dr. Tommy Medal regarding side effects from her myambutol, has stopped it. Patient is scheduled to follow up on 11/7.  Received the following advice from Dr. Tommy Medal and relayed it to the patient's husband:   "IF she is going to stop her ethambutol she will ALSO need to stop her azithromycin since azithromycin monotherapy for her M avium is a disastrous strategy. She is going to need at least 2 drugs to treat her M avium. We will likely need to get her clofazaimine"   Landis Gandy, RN

## 2014-11-01 DIAGNOSIS — J189 Pneumonia, unspecified organism: Secondary | ICD-10-CM | POA: Diagnosis not present

## 2014-11-01 DIAGNOSIS — J479 Bronchiectasis, uncomplicated: Secondary | ICD-10-CM | POA: Diagnosis not present

## 2014-11-01 DIAGNOSIS — A310 Pulmonary mycobacterial infection: Secondary | ICD-10-CM | POA: Diagnosis not present

## 2014-11-01 DIAGNOSIS — I10 Essential (primary) hypertension: Secondary | ICD-10-CM | POA: Diagnosis not present

## 2014-11-01 DIAGNOSIS — K859 Acute pancreatitis, unspecified: Secondary | ICD-10-CM | POA: Diagnosis not present

## 2014-11-01 DIAGNOSIS — Z48813 Encounter for surgical aftercare following surgery on the respiratory system: Secondary | ICD-10-CM | POA: Diagnosis not present

## 2014-11-08 ENCOUNTER — Other Ambulatory Visit: Payer: Self-pay | Admitting: Cardiovascular Disease

## 2014-11-15 ENCOUNTER — Encounter: Payer: Self-pay | Admitting: Infectious Disease

## 2014-11-15 ENCOUNTER — Ambulatory Visit (INDEPENDENT_AMBULATORY_CARE_PROVIDER_SITE_OTHER): Payer: Medicare Other | Admitting: Infectious Disease

## 2014-11-15 VITALS — BP 159/91 | HR 76 | Temp 97.5°F | Wt 97.5 lb

## 2014-11-15 DIAGNOSIS — I514 Myocarditis, unspecified: Secondary | ICD-10-CM

## 2014-11-15 DIAGNOSIS — M79674 Pain in right toe(s): Secondary | ICD-10-CM

## 2014-11-15 DIAGNOSIS — H5712 Ocular pain, left eye: Secondary | ICD-10-CM | POA: Diagnosis not present

## 2014-11-15 DIAGNOSIS — I400 Infective myocarditis: Secondary | ICD-10-CM

## 2014-11-15 DIAGNOSIS — H469 Unspecified optic neuritis: Secondary | ICD-10-CM | POA: Diagnosis not present

## 2014-11-15 DIAGNOSIS — A310 Pulmonary mycobacterial infection: Secondary | ICD-10-CM

## 2014-11-15 DIAGNOSIS — I10 Essential (primary) hypertension: Secondary | ICD-10-CM | POA: Diagnosis not present

## 2014-11-15 DIAGNOSIS — A439 Nocardiosis, unspecified: Secondary | ICD-10-CM

## 2014-11-15 DIAGNOSIS — J479 Bronchiectasis, uncomplicated: Secondary | ICD-10-CM | POA: Diagnosis not present

## 2014-11-15 DIAGNOSIS — M79676 Pain in unspecified toe(s): Secondary | ICD-10-CM | POA: Insufficient documentation

## 2014-11-15 DIAGNOSIS — J948 Other specified pleural conditions: Secondary | ICD-10-CM

## 2014-11-15 DIAGNOSIS — T797XXD Traumatic subcutaneous emphysema, subsequent encounter: Secondary | ICD-10-CM

## 2014-11-15 DIAGNOSIS — R9431 Abnormal electrocardiogram [ECG] [EKG]: Secondary | ICD-10-CM

## 2014-11-15 DIAGNOSIS — T50905A Adverse effect of unspecified drugs, medicaments and biological substances, initial encounter: Secondary | ICD-10-CM

## 2014-11-15 DIAGNOSIS — I4581 Long QT syndrome: Secondary | ICD-10-CM | POA: Diagnosis not present

## 2014-11-15 HISTORY — DX: Ocular pain, left eye: H57.12

## 2014-11-15 HISTORY — DX: Other specified pleural conditions: J94.8

## 2014-11-15 HISTORY — DX: Unspecified optic neuritis: H46.9

## 2014-11-15 HISTORY — DX: Pain in unspecified toe(s): M79.676

## 2014-11-15 NOTE — Progress Notes (Signed)
[facility]    Chief complaint: left toe swelling left eye pain, reduced visual acuity and hearing most of attributed to ethambutol Subjective:      Patient ID: Mia Hernandez, female    DOB: 13-Mar-1940, 74 y.o.   MRN: [id number]  HPI  74 year old lady with past medical history significant for diagnosis of Mycobacterium avium infection of the lungs, recent Nocardia  INTERVAL HiSTORY:     Started on therapy with clarithromycin rifampin and ethambutol 3 days per week. She had great difficulty tolerating the Biaxin which was she was taking it twice daily doses on the day she was taking it was reduced to once daily dosing on July 01 2012 Apparently at that time there was concern about toxicity although she states that now that she was found to need simply new hearing aids. Initially while on therapy for Mycobacterium avium showed mprovement in her symptoms of chronic daily cough and sputum production.   In Fall of 2013 while [place] 2013 apparently she had episodes of hemoptysis and was hospitalized due to Hospital in [place] where she was placed in isolation for rule out tuberculosis. Ultimately was realized that she was suffering from Mycobacterium avium infection alone.  She had repeat sputum taken Spring of 2014 and again grow Mycobacterium avium. The organism was sent for susceptibility testing and showed macrolide sensitivity and in vitro synergy with rifampin and ETH in inhibiting growth in lab. Amikacin would also be considered active at S she had.  I had Changed her to  daily therapy with macrolide (Azithromycin) ethambutol and rifampin. Since change she had been initiallycoughing less, she was gaining weight and was  absent fevers.   She saw Dr. Annamaria Boots in November 2014 when she reported 1 month she is coughing up more yellow/liquid phlem since on Rafampin. Chest x-ray was done and showed no changes. Sputum was sent for routine culture which only isolated  normal oral pharyngeal flora. AFB culture is without AFB seen on smear blood cultures in November DID grow M avium again   I had tried to get her on daily RIFABUTIN with continue azithro and ETH but she did not tolerate at all with severe anorexia, and apparent visual problems, stating that "I could not see for several hours."  She went back to Rifampin TIW,  azithro 500mg  daily, ETH 700mg  TU, TH< SA< SU  In the interim she was seen by her Cardiologist Dr. Sallyanne Kuster  who saw her in October and found NEW  deep symmetrical inverted T waves in all the inferior leads as well as V3-V6. The QTC prolongation on EKG that persistd on followup EKG. he was concern for possible antibiotic induced myocarditis and the patient stopped her anti-Mycobacterium drugs and had MRI of her heart performed which was normal echocardiogram was also normal.  Repeat EKG done in the next few days showed resolution of T wave changes and improvement in QT  Since coming off her anti-Mycobacterium avium drugs she had continued to cough on a daily basis   She had CXR that showed expansion of cavitary disease though most recent CXR showed stable findings.   Note her M avium was R to Moxifloxacin with MIC of 4.  It was I to linezolid with MIC of 16  It was S to clarithro with MIC of 2  Further MICs were:  Amikacin 16 Cipro 16 Ethambutol 8 Ethimodate > INH 8 Rifampin >8 Rifabutin 0.5 Streptomcin 32  Combination of ETH  with clarithro, erythrom, rifampin, rifabutin all produced no growth in vitro  See below      She came to our clinic multiple times in  Spring 2016 with worsening cough, with blood tinged sputum and actual hemoptysis, fatigue, lack of energy.  I corresponded with Dr Lorenda Cahill via email and he recommended not starting any therapy until he had a chance to review her case himself when she comes to visit him. She is on schedule to see him in August.  She was worked into clinic before seeing Dr. Lorenda Cahill  with  worsening blood tinged sputum and we ultimately decided upon consultation with Dr Lorenda Cahill and with Dr Sallyanne Kuster to re-challenge the patient with Azithro and [facility] which we did and there have been no EKG changes so far to suggest pericarditis , myocarditis or issues with QT prolongation.  Since she started on meds she has had improvement in sputum, no longer blood tinged and she is now able to sleep.  She has now seen Dr Lorenda Cahill who collected sputum sample (though I cannot access it in [place]). Dr. Lorenda Cahill felt upon review of her films that her pulmonary disease was amenable to combined medical and surgical approach with resection of large cavitary areas from lungs to be considered by Dr. Elenor Quinones at [facility].   In the interm she grew Nocardia species from sputum and has been started on zyvox which she was tolerating  But then developed nose blleeds.  She ultimately underwent surgery at [facility] on 09/27/14 with bronchoscopy, thoracoscopy with removal of 2 lobes of lung. She was changed from zyvox to rocephin (to which her Norardia was S) and has remained on this with plan of 6 weeks of IV abx. It is believed that the infected part of lung was removed with surgery. She also grew M avium from prior culture from Dr.Stout's office.  Her cough has DRAMATICALLY improved postoperatively but she did develop significant postoperative subcutaneous emphysema. She had resumed her azithromycin and ETH. She has had some dysphagia due to subcutaneous emphysema but this is improving.  Since I last saw her she developed NEW complaints of acute left toe pain, with pain in her left eye and decreased visual acuity, along with malaise, nausea and reduced hearing.She stopped EThambutol and with our advise also the azithromycin to avoid monotherapy for her M avium. She has no further hemoptysis episodes, weight is stablizing. She does still have chronic daily cough with yellow phlegm. She asked me to review findings from her CXR  including hydropneumothorax, lung nodules.       Past Medical History  Diagnosis Date  . Osteoporosis   . ALLERGIC RHINITIS   . Chronic back pain   . Mycobacterium avium-intracellulare infection ([place])   . Bronchiectasis ([place])   . 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 Hyponatremia 09/15/2014  . Subcutaneous emphysema ([place]) 10/13/2014  . Dysphagia 10/13/2014   Past Surgical History  Procedure Laterality Date  . Cholecystectomy    . Vaginal hysterectomy    . Laminectomy    . Breast biopsy     Family History  Problem Relation Age of Onset  . Breast cancer Mother   . Stroke Father   . Pulmonary embolism Brother    Social History  Substance Use Topics  . Smoking status: Never Smoker   . Smokeless tobacco: Never Used  . Alcohol Use: 0.6 oz/week    1 Glasses of wine per week     Comment: at  dinner     Review of Systems  Constitutional: Negative for diaphoresis, activity change, appetite change, fatigue and unexpected weight change.  HENT: Negative for congestion, sinus pressure and sneezing.   Eyes: Negative for photophobia and visual disturbance.  Respiratory: Positive for cough. Negative for stridor.   Cardiovascular: Negative for palpitations and leg swelling.  Gastrointestinal: Negative for vomiting, abdominal pain, diarrhea, constipation, blood in stool, abdominal distention and anal bleeding.  Genitourinary: Negative for dysuria, hematuria, flank pain and difficulty urinating.  Musculoskeletal: Positive for neck pain. Negative for back pain, joint swelling, arthralgias and gait problem.  Skin: Negative for color change, pallor and wound.  Neurological: Negative for dizziness, tremors, weakness and light-headedness.  Hematological: Negative for adenopathy. Does not bruise/bleed easily.  Psychiatric/Behavioral: Negative for behavioral problems, sleep disturbance, dysphoric mood, decreased concentration and agitation.         Objective:   Physical Exam  Constitutional: She is oriented to person, place, and time. She appears well-developed and well-nourished. No distress.  HENT:  Head: Normocephalic and atraumatic.  Mouth/Throat: No oropharyngeal exudate.  Eyes: Conjunctivae and EOM are normal. No scleral icterus.  Neck: Normal range of motion. Neck supple.    Cardiovascular: Normal rate, regular rhythm and normal heart sounds.  Exam reveals no gallop and no friction rub.   No murmur heard. Pulmonary/Chest: Effort normal. No respiratory distress. She has no wheezes. She has rales.    Crackles at bases, prolonged expiratory phase but more clear than last exam  Abdominal: She exhibits no distension.  Musculoskeletal: She exhibits no edema or tenderness.  Neurological: She is alert and oriented to person, place, and time. She exhibits normal muscle tone. Coordination normal.  Skin: Skin is warm and dry. No rash noted. She is not diaphoretic. No erythema. No pallor.  Psychiatric: She has a normal mood and affect. Her behavior is normal. Judgment and thought content normal.          Assessment & Plan:   #1 NEW left sided eye pain, left toe pain, reduced hearing.   The pain in her eyes could have been due to optic neuritis from [facility]. She states she also has a cataract she believes that could be contributing to the vision loss but the eye pain is highly concerning for me for possible ETH related optic neuritis  The toe pain could have been gout precipitated by [facility] as well  Hearing loss: has been gradual and long standing, may or may not be related to one of these 2 drugs. She is to see ophthalmology and ENT within a week  Will keep her off Ione    #2 Mycobacterium Avium Intracellulare infection and Nocardia  of the lungs sp resection of TWO lobes of lung    I GREATLY appreciate Dr. Lorenda Cahill and Dr. Florentina Jenny help with this challenging situtation.  Now with apparent adverse reaction to [facility], will need to  try to procure  Clofazimine and start this with azithromycin. She hates Rifampin and I dont think we will get her to be able to take this or rifabutin. Amikacin is too risky here  RTC in a month to reassess and try to get clofazamine from [facility] by IND  Finish her 6 weeks of Rocephin  #3 New Nocardia infection of the lungs. [place] are in [place]  Hopefully it was indeed in diseased resected lobes and we can "mop" this up with the IV rocephin that she has finished  #3 Abnormal EKG : ? Of drug induced vs  idiopathic pericarditis, myocarditis she has had no new findings on current regimen and followed by Dr. Orene Desanctis.  #4  Bronchiectasis: sp lobectomy  #5 Subcutaneous emphysema improving to folowup with Dr. Elenor Quinones  #6 Dysphagia: likely due to #5 and improving  #7 Hydropneumothorax: should resolve with time. Offered to check CXR  I spent greater than 40 minutes with the patient including greater than 50% of time in face to face counsel of the patient re her M avium infection, her  Nocardia infection,   Her bronchiectasis with cavitary lung disease, pericarditis, subcutaneous emphysema, dysphagia and her new ssx of eye pain, toe pain, hearing loss and in  coordination of her care.

## 2014-11-16 ENCOUNTER — Encounter: Payer: Self-pay | Admitting: Infectious Disease

## 2014-11-19 DIAGNOSIS — H16103 Unspecified superficial keratitis, bilateral: Secondary | ICD-10-CM | POA: Diagnosis not present

## 2014-11-19 DIAGNOSIS — Z961 Presence of intraocular lens: Secondary | ICD-10-CM | POA: Diagnosis not present

## 2014-11-19 DIAGNOSIS — H25811 Combined forms of age-related cataract, right eye: Secondary | ICD-10-CM | POA: Diagnosis not present

## 2014-11-19 DIAGNOSIS — H04123 Dry eye syndrome of bilateral lacrimal glands: Secondary | ICD-10-CM | POA: Diagnosis not present

## 2014-11-23 ENCOUNTER — Encounter: Payer: Self-pay | Admitting: Cardiovascular Disease

## 2014-11-23 DIAGNOSIS — I514 Myocarditis, unspecified: Secondary | ICD-10-CM | POA: Insufficient documentation

## 2014-11-23 NOTE — Progress Notes (Signed)
Patient ID: Mia Hernandez, female   DOB: [birth year], 74 y.o.   MRN: [id number]     Cardiology Office Note   Date:  11/24/2014   ID:  Mia Hernandez, DOB Aug 22, 1940, MRN [id number]  PCP:  Geoffery Lyons, MD  Cardiologist:   Sanda Klein, MD   Chief Complaint  Patient presents with  . Follow-up    no chest pain, no shortness of breath, no edema, no pain in legs, no cramping in legs, no lightheadedness, no dizziness      History of Present Illness: Makda Reifsteck is a 74 y.o. female who presents for follow up of HTN and history of acute myocarditis (possibly hypersensitivity to one of her antimycobacterial drugs). She has grown MAI from sputum repeatedly, more recently Nocardia. She is better after 2 lobe partial pneumonectomy at [facility] on Sept 19. Still issues with hydropneuomthorax and subcutaneous emhysema. Now off antimycobacterials due to left toe pain (gout?) and eye pain with reduced visual acuity. Finished Rocephin for the Nocardia.  cough has completely resolved. She remains underweight. She is walking 2 miles a day. She brings a list of her home blood pressure which is always in the lower end of normal range (106/56-129/72).   Past Medical History  Diagnosis Date  . Osteoporosis   . ALLERGIC RHINITIS   . Chronic back pain   . Mycobacterium avium-intracellulare infection ([place])   . Bronchiectasis ([place])   . 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 Hyponatremia 09/15/2014  . Subcutaneous emphysema ([place]) 10/13/2014  . Dysphagia 10/13/2014  . Toe pain 11/15/2014  . Acute left eye pain 11/15/2014  . Hydropneumothorax 11/15/2014  . 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 Cholecystectomy    . Vaginal hysterectomy    . Laminectomy    . Breast biopsy       Current Outpatient Prescriptions  Medication Sig Dispense Refill  . valsartan (DIOVAN) 160 MG tablet Take 1 tablet (160 mg total) by mouth daily. 90 tablet  3   No current facility-administered medications for this visit.    Allergies:   Ethambutol; Latex; Rifabutin; Aspirin; Betadine; Codeine; Iodinated diagnostic agents; Macrolides and ketolides; Morphine and related; Penicillins; Prednisone; and Sulfa antibiotics    Social History:  The patient  reports that she has never smoked. She has never used smokeless tobacco. She reports that she drinks about 0.6 oz of alcohol per week. She reports that she does not use illicit drugs.   Family History:  The patient's family history includes Breast cancer in her mother; Pulmonary embolism in her brother; Stroke in her father.    ROS:  Please see the history of present illness.    Otherwise, review of systems positive for none.   All other systems are reviewed and negative.    PHYSICAL EXAM: VS:  BP 142/84 mmHg  Pulse 71  Resp 16  Ht 4\' 11"  (1.499 m)  Wt 95 lb 8 oz (43.319 kg)  BMI 19.28 kg/m2 , BMI Body mass index is 19.28 kg/(m^2).  General: Alert, oriented x3, no distress Head: no evidence of trauma, PERRL, EOMI, no exophtalmos or lid lag, no myxedema, no xanthelasma; normal ears, nose and oropharynx Neck: normal jugular venous pulsations and no hepatojugular reflux; brisk carotid pulses without delay and no carotid bruits Chest:  Reduced breath sounds, dullness to percussion and egophony lower 1/4 right lung field, symmetrical and full respiratory excursions Cardiovascular: normal position and quality of the  apical impulse, regular rhythm, normal first and second heart sounds, no murmurs, rubs or gallops Abdomen: no tenderness or distention, no masses by palpation, no abnormal pulsatility or arterial bruits, normal bowel sounds, no hepatosplenomegaly Extremities: no clubbing, cyanosis or edema; 2+ radial, ulnar and brachial pulses bilaterally; 2+ right femoral, posterior tibial and dorsalis pedis pulses; 2+ left femoral, posterior tibial and dorsalis pedis pulses; no subclavian or femoral  bruits Neurological: grossly nonfocal Psych: euthymic mood, full affect   EKG:  EKG is ordered today. The ekg ordered today demonstrates  Normal sinus rhythm, QTC 421 ms   Recent Labs: 09/15/2014: ALT 16; BUN 8; Creat 0.55*; Hemoglobin 13.9; Platelets 283; Potassium 4.1; Sodium 131*    Lipid Panel    Component Value Date/Time   CHOL 212* 03/08/2014 0834   TRIG 58 03/08/2014 0834   HDL 120 03/08/2014 0834   CHOLHDL 1.8 03/08/2014 0834   VLDL 12 03/08/2014 0834   LDLCALC 80 03/08/2014 0834      Wt Readings from Last 3 Encounters:  11/24/14 95 lb 8 oz (43.319 kg)  11/15/14 97 lb 8 oz (44.226 kg)  10/13/14 98 lb 4 oz (44.566 kg)      Other studies Reviewed: Additional studies/ records that were reviewed today include:  Records from Dr. Tommy Medal.   ASSESSMENT AND PLAN:  1.  Essential hypertension with excellent control. Her blood pressure has steadily decreased after resolution of her infectious problems and the recovered from surgery. Decrease valsartan 260 mg daily. Call me if her systolic blood pressure is greater than 140 at home  2.  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 Completely resolved here at normal ECG normal QT.  3.  Malnutrition/underweight. She has always been lean I think she needs to try to put on some weight.,   Current medicines are reviewed at length with the patient today.  The patient does not have concerns regarding medicines.   Labs/ tests ordered today include:   Orders Placed This Encounter  Procedures  . EKG 12-Lead    Patient Instructions  Your physician has recommended you make the following change in your medication: DECREASE VALSARTAN TO 160MG  DAILY.  A NEW RX HAS BEEN SENT TO YOUR PHARMACY.  Dr. Sallyanne Kuster recommends that you schedule a follow-up appointment in: ONE YEAR.         Mikael Spray, MD  11/24/2014 9:44 AM    Sanda Klein, MD, [facility] [phone] office [phone] pager

## 2014-11-24 ENCOUNTER — Encounter: Payer: Self-pay | Admitting: Cardiovascular Disease

## 2014-11-24 ENCOUNTER — Ambulatory Visit (INDEPENDENT_AMBULATORY_CARE_PROVIDER_SITE_OTHER): Payer: Medicare Other | Admitting: Cardiovascular Disease

## 2014-11-24 VITALS — BP 142/84 | HR 71 | Resp 16 | Ht 59.0 in | Wt 95.5 lb

## 2014-11-24 DIAGNOSIS — I10 Essential (primary) hypertension: Secondary | ICD-10-CM | POA: Diagnosis not present

## 2014-11-24 DIAGNOSIS — I514 Myocarditis, unspecified: Secondary | ICD-10-CM | POA: Diagnosis not present

## 2014-11-24 MED ORDER — VALSARTAN 160 MG PO TABS: 160.0000 mg | ORAL_TABLET | Freq: Every day | ORAL | Status: DC

## 2014-11-24 NOTE — Patient Instructions (Signed)
Your physician has recommended you make the following change in your medication: DECREASE VALSARTAN TO 160MG  DAILY.  A NEW RX HAS BEEN SENT TO YOUR PHARMACY.  Dr. Sallyanne Kuster recommends that you schedule a follow-up appointment in: ONE YEAR.

## 2014-11-29 NOTE — Telephone Encounter (Signed)
Patient called this morning, stating she has done additional research and she would like to have Dr. Tommy Medal pursue the clofazimine.  She has decided against the ethambutol.  She stated that she realizes she needs to be on some treatment per Dr. Tommy Medal and Dr. Romie Levee recommendations.  She would like to do the clofazimine, but not until after Christmas.   Landis Gandy, RN

## 2014-11-30 DIAGNOSIS — H2511 Age-related nuclear cataract, right eye: Secondary | ICD-10-CM | POA: Diagnosis not present

## 2014-11-30 DIAGNOSIS — H25811 Combined forms of age-related cataract, right eye: Secondary | ICD-10-CM | POA: Diagnosis not present

## 2014-11-30 DIAGNOSIS — H25031 Anterior subcapsular polar age-related cataract, right eye: Secondary | ICD-10-CM | POA: Diagnosis not present

## 2014-11-30 DIAGNOSIS — H25041 Posterior subcapsular polar age-related cataract, right eye: Secondary | ICD-10-CM | POA: Diagnosis not present

## 2014-12-16 ENCOUNTER — Encounter: Payer: Self-pay | Admitting: Infectious Disease

## 2014-12-16 ENCOUNTER — Ambulatory Visit (INDEPENDENT_AMBULATORY_CARE_PROVIDER_SITE_OTHER): Payer: Medicare Other | Admitting: Infectious Disease

## 2014-12-16 VITALS — BP 173/105 | HR 81 | Temp 98.7°F | Wt 99.8 lb

## 2014-12-16 DIAGNOSIS — R9431 Abnormal electrocardiogram [ECG] [EKG]: Secondary | ICD-10-CM | POA: Diagnosis not present

## 2014-12-16 DIAGNOSIS — A439 Nocardiosis, unspecified: Secondary | ICD-10-CM | POA: Diagnosis not present

## 2014-12-16 DIAGNOSIS — M79676 Pain in unspecified toe(s): Secondary | ICD-10-CM

## 2014-12-16 DIAGNOSIS — J471 Bronchiectasis with (acute) exacerbation: Secondary | ICD-10-CM

## 2014-12-16 DIAGNOSIS — A310 Pulmonary mycobacterial infection: Secondary | ICD-10-CM

## 2014-12-16 DIAGNOSIS — H5712 Ocular pain, left eye: Secondary | ICD-10-CM

## 2014-12-16 DIAGNOSIS — J479 Bronchiectasis, uncomplicated: Secondary | ICD-10-CM | POA: Diagnosis not present

## 2014-12-16 MED ORDER — AZITHROMYCIN 250 MG PO TABS: 250.0000 mg | ORAL_TABLET | Freq: Every day | ORAL | Status: DC

## 2014-12-16 MED ORDER — ETHAMBUTOL HCL 400 MG PO TABS: 1200.0000 mg | ORAL_TABLET | ORAL | Status: DC

## 2014-12-16 NOTE — Progress Notes (Signed)
[facility]    Chief complaint: follow-up for M avium infectino  Subjective:      Patient ID: Mia Hernandez, female    DOB: 02/27/1940, 74 y.o.   MRN: [id number]  HPI  74 year old lady with past medical history significant for diagnosis of Mycobacterium avium infection of the lungs, recent Nocardia  INTERVAL HiSTORY:     Started on therapy with clarithromycin rifampin and ethambutol 3 days per week. She had great difficulty tolerating the Biaxin which was she was taking it twice daily doses on the day she was taking it was reduced to once daily dosing on July 01 2012 Apparently at that time there was concern about toxicity although she states that now that she was found to need simply new hearing aids. Initially while on therapy for Mycobacterium avium showed mprovement in her symptoms of chronic daily cough and sputum production.   In Fall of 2013 while [place] 2013 apparently she had episodes of hemoptysis and was hospitalized due to Hospital in [place] where she was placed in isolation for rule out tuberculosis. Ultimately was realized that she was suffering from Mycobacterium avium infection alone.  She had repeat sputum taken Spring of 2014 and again grow Mycobacterium avium. The organism was sent for susceptibility testing and showed macrolide sensitivity and in vitro synergy with rifampin and ETH in inhibiting growth in lab. Amikacin would also be considered active at S she had.  I had Changed her to  daily therapy with macrolide (Azithromycin) ethambutol and rifampin. Since change she had been initiallycoughing less, she was gaining weight and was  absent fevers.   She saw Dr. Annamaria Boots in November 2014 when she reported 1 month she is coughing up more yellow/liquid phlem since on Rafampin. Chest x-ray was done and showed no changes. Sputum was sent for routine culture which only isolated normal oral pharyngeal flora. AFB culture is without AFB seen on  smear blood cultures in November DID grow M avium again   I had tried to get her on daily RIFABUTIN with continue azithro and ETH but she did not tolerate at all with severe anorexia, and apparent visual problems, stating that "I could not see for several hours."  She went back to Rifampin TIW,  azithro 500mg  daily, ETH 700mg  TU, TH< SA< SU  In the interim she was seen by her Cardiologist Dr. Sallyanne Kuster  who saw her in October and found NEW  deep symmetrical inverted T waves in all the inferior leads as well as V3-V6. The QTC prolongation on EKG that persistd on followup EKG. he was concern for possible antibiotic induced myocarditis and the patient stopped her anti-Mycobacterium drugs and had MRI of her heart performed which was normal echocardiogram was also normal.  Repeat EKG done in the next few days showed resolution of T wave changes and improvement in QT  Since coming off her anti-Mycobacterium avium drugs she had continued to cough on a daily basis   She had CXR that showed expansion of cavitary disease though most recent CXR showed stable findings.   Note her M avium was R to Moxifloxacin with MIC of 4.  It was I to linezolid with MIC of 16  It was S to clarithro with MIC of 2  Further MICs were:  Amikacin 16 Cipro 16 Ethambutol 8 Ethimodate > INH 8 Rifampin >8 Rifabutin 0.5 Streptomcin 32  Combination of ETH with clarithro, erythrom, rifampin, rifabutin all produced no growth in  vitro  See below      She came to our clinic multiple times in  Spring 2016 with worsening cough, with blood tinged sputum and actual hemoptysis, fatigue, lack of energy.  I corresponded with Dr Lorenda Cahill via email and he recommended not starting any therapy until he had a chance to review her case himself when she comes to visit him. She is on schedule to see him in August.  She was worked into clinic before seeing Dr. Lorenda Cahill with  worsening blood tinged sputum and we ultimately decided upon  consultation with Dr Lorenda Cahill and with Dr Sallyanne Kuster to re-challenge the patient with Azithro and [facility] which we did and there have been no EKG changes so far to suggest pericarditis , myocarditis or issues with QT prolongation.  Since she started on meds she has had improvement in sputum, no longer blood tinged and she is now able to sleep.  She has now seen Dr Lorenda Cahill who collected sputum sample (though I cannot access it in [place]). Dr. Lorenda Cahill felt upon review of her films that her pulmonary disease was amenable to combined medical and surgical approach with resection of large cavitary areas from lungs to be considered by Dr. Elenor Quinones at [facility].   In the interm she grew Nocardia species from sputum and has been started on zyvox which she was tolerating  But then developed nose blleeds.  She ultimately underwent surgery at [facility] on 09/27/14 with bronchoscopy, thoracoscopy with removal of 2 lobes of lung. She was changed from zyvox to rocephin (to which her Norardia was S) and has remained on this with plan of 6 weeks of IV abx. It is believed that the infected part of lung was removed with surgery. She also grew M avium from prior culture from Dr.Stout's office.  Her cough has DRAMATICALLY improved postoperatively but she did develop significant postoperative subcutaneous emphysema. She had resumed her azithromycin and ETH. She has had some dysphagia due to subcutaneous emphysema but this is improving.  Before my last visit  she developed NEW complaints of acute left toe pain, with pain in her left eye and decreased visual acuity, along with malaise, nausea and reduced hearing.She stopped EThambutol and with our advise also the azithromycin to avoid monotherapy for her M avium. She has no further hemoptysis episodes, weight is stablizing. She does still have chronic daily cough with yellow phlegm. She asked me to review findings from her CXR including hydropneumothorax, lung nodules.   She has remained off  both since then.  WE had in the interim considered procuring clofazamine. Dr. Brigitte Pulse at [facility] corresponded with me and was very skeptical that the patients eye pain was due to [facility] and recommended rechallenge with [facility] at 25mg /kg TIW along with continued Azithro first.   Patient was willing to try this re-challenge but after the holidays.  Her cough is still daily but largely clear.      Past Medical History  Diagnosis Date  . Osteoporosis   . ALLERGIC RHINITIS   . Chronic back pain   . Mycobacterium avium-intracellulare infection ([place])   . Bronchiectasis ([place])   . 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 Hyponatremia 09/15/2014  . Subcutaneous emphysema ([place]) 10/13/2014  . Dysphagia 10/13/2014  . Toe pain 11/15/2014  . Acute left eye pain 11/15/2014  . Hydropneumothorax 11/15/2014  . 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 Cholecystectomy    .  Vaginal hysterectomy    . Laminectomy    . Breast biopsy     Family History  Problem Relation Age of Onset  . Breast cancer Mother   . Stroke Father   . Pulmonary embolism Brother    Social History  Substance Use Topics  . Smoking status: Never Smoker   . Smokeless tobacco: Never Used  . Alcohol Use: 0.6 oz/week    1 Glasses of wine per week     Comment: at dinner     Review of Systems  Constitutional: Negative for diaphoresis, activity change, appetite change, fatigue and unexpected weight change.  HENT: Negative for congestion, sinus pressure and sneezing.   Eyes: Negative for photophobia and visual disturbance.  Respiratory: Positive for cough. Negative for stridor.   Cardiovascular: Negative for palpitations and leg swelling.  Gastrointestinal: Negative for vomiting, abdominal pain, diarrhea, constipation, blood in stool, abdominal distention and anal bleeding.  Genitourinary: Negative for dysuria, hematuria, flank pain and difficulty urinating.    Musculoskeletal: Positive for neck pain. Negative for back pain, joint swelling, arthralgias and gait problem.  Skin: Negative for color change, pallor and wound.  Neurological: Negative for dizziness, tremors, weakness and light-headedness.  Hematological: Negative for adenopathy. Does not bruise/bleed easily.  Psychiatric/Behavioral: Negative for behavioral problems, sleep disturbance, dysphoric mood, decreased concentration and agitation.       Objective:   Physical Exam  Constitutional: She is oriented to person, place, and time. She appears well-developed and well-nourished. No distress.  HENT:  Head: Normocephalic and atraumatic.  Mouth/Throat: No oropharyngeal exudate.  Eyes: Conjunctivae and EOM are normal. No scleral icterus.  Neck: Normal range of motion. Neck supple.  Cardiovascular: Normal rate, regular rhythm and normal heart sounds.  Exam reveals no gallop and no friction rub.   No murmur heard. Pulmonary/Chest: Effort normal. No respiratory distress. She has no wheezes. She has no rhonchi.    Crackles at bases, prolonged expiratory phase but more clear than last exam  Abdominal: She exhibits no distension.  Musculoskeletal: She exhibits no edema or tenderness.  Neurological: She is alert and oriented to person, place, and time. She exhibits normal muscle tone. Coordination normal.  Skin: Skin is warm and dry. No rash noted. She is not diaphoretic. No erythema. No pallor.  Psychiatric: She has a normal mood and affect. Her behavior is normal. Judgment and thought content normal.          Assessment & Plan:   #1 NEW left sided eye pain, left toe pain, reduced hearing.   Eye pain and toe pain are gone. Hearing better with hearing aids. See below re rechallenge   #2 Mycobacterium Avium Intracellulare infection and Nocardia  of the lungs sp resection of TWO lobes of lung   Will rechallenge with ETH 25mg /kg  =m 1200mg  TIW and daily 250mg  azithromycin  #3 New  Nocardia infection of the lungs. Sensi are in [place]  Hopefully it was indeed in diseased resected lobes and we can "mop" this up with the IV rocephin that she has finished  #3 Abnormal EKG : ? Of drug induced vs idiopathic pericarditis, myocarditis she has had no new findings on current regimen and followed by Dr. Orene Desanctis.  #4  Bronchiectasis: sp lobectomy  #5 Subcutaneous emphysema improving to folowup with Dr. Elenor Quinones  I spent greater than 25 minutes with the patient including greater than 50% of time in face to face counsel of the patient re her M avium infection, her  Nocardia infection,  Her bronchiectasis with cavitary lung disease, pericarditis, subcutaneous emphysema,  and her new ssx of eye pain, toe pain, hearing loss and in  coordination of her care.

## 2015-01-06 ENCOUNTER — Telehealth: Payer: Self-pay | Admitting: Cardiovascular Disease

## 2015-01-06 ENCOUNTER — Telehealth: Payer: Self-pay | Admitting: *Deleted

## 2015-01-06 NOTE — Telephone Encounter (Signed)
Patient called back and reported everything has normalized. BP has returned to normal. No more skipped beats.  Will continue to hold medication unless she hears different from physician.

## 2015-01-06 NOTE — Telephone Encounter (Signed)
Mrs. Giambra is calling back

## 2015-01-06 NOTE — Telephone Encounter (Signed)
Patient called sating the azithromycin and ethambutol are causing her to have elevated B/P and racing heart. She has contacted her cardiologist and she wanted Dr. Tommy Medal to know that she is stopping these medications. Mia Hernandez

## 2015-01-06 NOTE — Telephone Encounter (Signed)
Patient has been back on Myambutol and Azithromycin Last night she noticed her heart was skipping beat and her BP was going up Last medication was last evening Michela Pitcher this has occurred before so she is stopping the medication; just wants to know if there is anything else she needs to have done such as any kind of test.

## 2015-01-06 NOTE — Telephone Encounter (Signed)
Mia Hernandez is calling to see if she should stop taking Myambutol, and Azithromycin , because her blood pressure is going up ,and her heart is skipping . Please call   thanks

## 2015-01-08 NOTE — Telephone Encounter (Signed)
Hard to be sure that her transient symptoms were from the antibiotics. Could she come in for an ECG only, please?

## 2015-01-11 NOTE — Telephone Encounter (Signed)
She really makes it very difficult to treat her I am glad that she had lung reduction surgery since that helped so much.  She may have hada  Gout flare related to [facility] a few weeks ago too

## 2015-02-07 ENCOUNTER — Other Ambulatory Visit: Payer: Self-pay | Admitting: Infectious Disease

## 2015-02-07 ENCOUNTER — Telehealth: Payer: Self-pay | Admitting: *Deleted

## 2015-02-07 DIAGNOSIS — A319 Mycobacterial infection, unspecified: Secondary | ICD-10-CM

## 2015-02-07 NOTE — Telephone Encounter (Signed)
Pt wondering if chest x-ray can be done prior to [place] on 02/18/15?  MD please order and have staff member notify pt.

## 2015-02-10 ENCOUNTER — Ambulatory Visit
Admission: RE | Admit: 2015-02-10 | Discharge: 2015-02-10 | Disposition: A | Discharge status: Home / Self Care | Payer: Medicare Other | Source: Ambulatory Visit | Attending: Infectious Disease | Admitting: Infectious Disease

## 2015-02-10 DIAGNOSIS — R05 Cough: Secondary | ICD-10-CM | POA: Diagnosis not present

## 2015-02-10 DIAGNOSIS — A319 Mycobacterial infection, unspecified: Secondary | ICD-10-CM

## 2015-02-11 NOTE — Progress Notes (Signed)
Excellent

## 2015-02-16 ENCOUNTER — Telehealth: Payer: Self-pay | Admitting: Internal Medicine

## 2015-02-16 ENCOUNTER — Ambulatory Visit (INDEPENDENT_AMBULATORY_CARE_PROVIDER_SITE_OTHER): Payer: Medicare Other | Admitting: Internal Medicine

## 2015-02-16 ENCOUNTER — Encounter: Payer: Self-pay | Admitting: Infectious Disease

## 2015-02-16 ENCOUNTER — Other Ambulatory Visit (INDEPENDENT_AMBULATORY_CARE_PROVIDER_SITE_OTHER): Payer: Medicare Other

## 2015-02-16 ENCOUNTER — Encounter: Payer: Self-pay | Admitting: Internal Medicine

## 2015-02-16 ENCOUNTER — Ambulatory Visit (INDEPENDENT_AMBULATORY_CARE_PROVIDER_SITE_OTHER): Payer: Medicare Other | Admitting: Infectious Disease

## 2015-02-16 VITALS — BP 112/66 | HR 93 | Ht 60.0 in | Wt 103.2 lb

## 2015-02-16 VITALS — Wt 102.0 lb

## 2015-02-16 DIAGNOSIS — A439 Nocardiosis, unspecified: Secondary | ICD-10-CM

## 2015-02-16 DIAGNOSIS — I514 Myocarditis, unspecified: Secondary | ICD-10-CM | POA: Diagnosis not present

## 2015-02-16 DIAGNOSIS — J479 Bronchiectasis, uncomplicated: Secondary | ICD-10-CM | POA: Diagnosis not present

## 2015-02-16 DIAGNOSIS — J90 Pleural effusion, not elsewhere classified: Secondary | ICD-10-CM | POA: Diagnosis not present

## 2015-02-16 DIAGNOSIS — M79674 Pain in right toe(s): Secondary | ICD-10-CM | POA: Diagnosis not present

## 2015-02-16 DIAGNOSIS — J470 Bronchiectasis with acute lower respiratory infection: Secondary | ICD-10-CM

## 2015-02-16 DIAGNOSIS — I309 Acute pericarditis, unspecified: Secondary | ICD-10-CM | POA: Diagnosis not present

## 2015-02-16 DIAGNOSIS — H5712 Ocular pain, left eye: Secondary | ICD-10-CM | POA: Diagnosis not present

## 2015-02-16 DIAGNOSIS — A310 Pulmonary mycobacterial infection: Secondary | ICD-10-CM

## 2015-02-16 LAB — CBC WITH DIFFERENTIAL/PLATELET
BASOS ABS: 0 10*3/uL (ref 0.0–0.1)
Basophils Relative: 0.5 % (ref 0.0–3.0)
EOS ABS: 0.1 10*3/uL (ref 0.0–0.7)
Eosinophils Relative: 1.1 % (ref 0.0–5.0)
HCT: 44.5 % (ref 36.0–46.0)
Hemoglobin: 14.8 g/dL (ref 12.0–15.0)
LYMPHS ABS: 2.4 10*3/uL (ref 0.7–4.0)
Lymphocytes Relative: 31.5 % (ref 12.0–46.0)
MCHC: 33.2 g/dL (ref 30.0–36.0)
MCV: 91.9 fl (ref 78.0–100.0)
MONO ABS: 0.8 10*3/uL (ref 0.1–1.0)
Monocytes Relative: 10.2 % (ref 3.0–12.0)
NEUTROS ABS: 4.3 10*3/uL (ref 1.4–7.7)
NEUTROS PCT: 56.7 % (ref 43.0–77.0)
PLATELETS: 295 10*3/uL (ref 150.0–400.0)
RBC: 4.84 Mil/uL (ref 3.87–5.11)
RDW: 14.5 % (ref 11.5–15.5)
WBC: 7.7 10*3/uL (ref 4.0–10.5)

## 2015-02-16 LAB — PROTIME-INR
INR: 1.1 ratio — AB (ref 0.8–1.0)
PROTHROMBIN TIME: 11.8 s (ref 9.6–13.1)

## 2015-02-16 LAB — LACTATE DEHYDROGENASE: LDH: 173 U/L (ref 94–250)

## 2015-02-16 LAB — PROTEIN, TOTAL: Total Protein: 8.1 g/dL (ref 6.0–8.3)

## 2015-02-16 NOTE — Telephone Encounter (Signed)
sched pt appoint.Mia Hernandez'

## 2015-02-16 NOTE — Assessment & Plan Note (Signed)
Right middle lobe and right lower lobe resection in September, 2016 at [facility] remove the bleeding source. Cough is much improved

## 2015-02-16 NOTE — Progress Notes (Signed)
[facility] for Infectious Disease    Chief complaint: difficulty breathing and taking full breath and  follow-up for M avium infection and pleural effusion Subjective:      Patient ID: Mia Hernandez, female    DOB: 01-Feb-1940, 75 y.o.   MRN: [id number]  HPI  75 year old lady with past medical history significant for diagnosis of Mycobacterium avium infection of the lungs, recent Nocardia  INTERVAL HiSTORY:     Started on therapy with clarithromycin rifampin and ethambutol 3 days per week. She had great difficulty tolerating the Biaxin which was she was taking it twice daily doses on the day she was taking it was reduced to once daily dosing on July 01 2012 Apparently at that time there was concern about toxicity although she states that now that she was found to need simply new hearing aids. Initially while on therapy for Mycobacterium avium showed mprovement in her symptoms of chronic daily cough and sputum production.   In Fall of 2013 while [place] 2013 apparently she had episodes of hemoptysis and was hospitalized due to Hospital in [place] where she was placed in isolation for rule out tuberculosis. Ultimately was realized that she was suffering from Mycobacterium avium infection alone.  She had repeat sputum taken Spring of 2014 and again grow Mycobacterium avium. The organism was sent for susceptibility testing and showed macrolide sensitivity and in vitro synergy with rifampin and ETH in inhibiting growth in lab. Amikacin would also be considered active at S she had.  I had Changed her to  daily therapy with macrolide (Azithromycin) ethambutol and rifampin. Since change she had been initiallycoughing less, she was gaining weight and was  absent fevers.   She saw Dr. Annamaria Boots in November 2014 when she reported 1 month she is coughing up more yellow/liquid phlem since on Rafampin. Chest x-ray was done and showed no changes. Sputum was sent for routine culture which only  isolated normal oral pharyngeal flora. AFB culture is without AFB seen on smear blood cultures in November DID grow M avium again   I had tried to get her on daily RIFABUTIN with continue azithro and ETH but she did not tolerate at all with severe anorexia, and apparent visual problems, stating that "I could not see for several hours."  She went back to Rifampin TIW,  azithro 500mg  daily, ETH 700mg  TU, TH< SA< SU  In the interim she was seen by her Cardiologist Dr. Sallyanne Kuster  who saw her in October and found NEW  deep symmetrical inverted T waves in all the inferior leads as well as V3-V6. The QTC prolongation on EKG that persistd on followup EKG. he was concern for possible antibiotic induced myocarditis and the patient stopped her anti-Mycobacterium drugs and had MRI of her heart performed which was normal echocardiogram was also normal.  Repeat EKG done in the next few days showed resolution of T wave changes and improvement in QT  Since coming off her anti-Mycobacterium avium drugs she had continued to cough on a daily basis   She had CXR that showed expansion of cavitary disease though most recent CXR showed stable findings.   Note her M avium was R to Moxifloxacin with MIC of 4.  It was I to linezolid with MIC of 16  It was S to clarithro with MIC of 2  Further MICs were:  Amikacin 16 Cipro 16 Ethambutol 8 Ethimodate > INH 8 Rifampin >8 Rifabutin 0.5 Streptomcin 32  Combination of ETH  with clarithro, erythrom, rifampin, rifabutin all produced no growth in vitro  See below      She came to our clinic multiple times in  Spring 2016 with worsening cough, with blood tinged sputum and actual hemoptysis, fatigue, lack of energy.  I corresponded with Dr Lorenda Cahill via email and he recommended not starting any therapy until he had a chance to review her case himself when she comes to visit him. She is on schedule to see him in August.  She was worked into clinic before seeing Dr.  Lorenda Cahill with  worsening blood tinged sputum and we ultimately decided upon consultation with Dr Lorenda Cahill and with Dr Sallyanne Kuster to re-challenge the patient with Azithro and [facility] which we did and there have been no EKG changes so far to suggest pericarditis , myocarditis or issues with QT prolongation.  Since she started on meds she has had improvement in sputum, no longer blood tinged and she is now able to sleep.  She has now seen Dr Lorenda Cahill who collected sputum sample (though I cannot access it in [place]). Dr. Lorenda Cahill felt upon review of her films that her pulmonary disease was amenable to combined medical and surgical approach with resection of large cavitary areas from lungs to be considered by Dr. Elenor Quinones at [facility].   In the interm she grew Nocardia species from sputum and has been started on zyvox which she was tolerating  But then developed nose blleeds.  She ultimately underwent surgery at [facility] on 09/27/14 with bronchoscopy, thoracoscopy with removal of 2 lobes of lung. She was changed from zyvox to rocephin (to which her Norardia was S) and has remained on this with plan of 6 weeks of IV abx. It is believed that the infected part of lung was removed with surgery. She also grew M avium from prior culture from Dr.Stout's office.  Her cough had DRAMATICALLY improved postoperatively but she did develop significant postoperative subcutaneous emphysema. She had resumed her azithromycin and ETH. She has had some dysphagia due to subcutaneous emphysema but this is improving.  A few visits ago she developed NEW complaints of acute left toe pain, with pain in her left eye and decreased visual acuity, along with malaise, nausea and reduced hearing.She stopped EThambutol and with our advise also the azithromycin to avoid monotherapy for her M avium.   WE had in the interim considered procuring clofazamine. Dr. Brigitte Pulse at [facility] corresponded with me and was very skeptical that the patients eye pain was due to  [facility] and recommended rechallenge with ETH at 25mg /kg TIW along with continued Azithro first.   Unfortunately when she restarted those meds she began experiencing eye pain, joint pain and palpitations and stopped all f these meds again   She requested a Cxr be done prior to her visit and this showed a moderate right sided pleural effusion.  She states that she still has early am cough with clear sputum. She is without fevers, chills or malaise and she continues to gain weight.    Past Medical History  Diagnosis Date  . Osteoporosis   . ALLERGIC RHINITIS   . Chronic back pain   . Mycobacterium avium-intracellulare infection ([place])   . Bronchiectasis ([place])   . 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 Hyponatremia 09/15/2014  . Subcutaneous emphysema ([place]) 10/13/2014  . Dysphagia 10/13/2014  . Toe pain 11/15/2014  . Acute left eye pain 11/15/2014  . Hydropneumothorax 11/15/2014  . 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 Cholecystectomy    . Vaginal hysterectomy    . Laminectomy    . Breast biopsy     Family History  Problem Relation Age of Onset  . Breast cancer Mother   . Stroke Father   . Pulmonary embolism Brother    Social History  Substance Use Topics  . Smoking status: Never Smoker   . Smokeless tobacco: Never Used  . Alcohol Use: 0.6 oz/week    1 Glasses of wine per week     Comment: at dinner     Review of Systems  Constitutional: Negative for diaphoresis, activity change, appetite change, fatigue and unexpected weight change.  HENT: Negative for congestion, sinus pressure and sneezing.   Eyes: Negative for photophobia and visual disturbance.  Respiratory: Positive for cough and shortness of breath. Negative for stridor.   Cardiovascular: Negative for palpitations and leg swelling.  Gastrointestinal: Negative for vomiting, abdominal pain, diarrhea, constipation, blood in stool, abdominal  distention and anal bleeding.  Genitourinary: Negative for dysuria, hematuria, flank pain and difficulty urinating.  Musculoskeletal: Negative for back pain, joint swelling, arthralgias and gait problem.  Skin: Negative for color change, pallor and wound.  Neurological: Negative for dizziness, tremors, weakness and light-headedness.  Hematological: Negative for adenopathy. Does not bruise/bleed easily.  Psychiatric/Behavioral: Negative for behavioral problems, sleep disturbance, dysphoric mood, decreased concentration and agitation.       Objective:   Physical Exam  Constitutional: She is oriented to person, place, and time. She appears well-developed and well-nourished. No distress.  HENT:  Head: Normocephalic and atraumatic.  Mouth/Throat: No oropharyngeal exudate.  Eyes: Conjunctivae and EOM are normal. No scleral icterus.  Neck: Normal range of motion. Neck supple.  Cardiovascular: Normal rate, regular rhythm and normal heart sounds.  Exam reveals no gallop and no friction rub.   No murmur heard. Pulmonary/Chest: Effort normal. No respiratory distress. She has decreased breath sounds in the right lower field. She has wheezes in the right lower field. She has no rhonchi.    Crackles at bases, prolonged expiratory phase  Dullness to percussion right base  Abdominal: She exhibits no distension.  Musculoskeletal: She exhibits no edema or tenderness.  Neurological: She is alert and oriented to person, place, and time. She exhibits normal muscle tone. Coordination normal.  Skin: Skin is warm and dry. No rash noted. She is not diaphoretic. No erythema. No pallor.  Psychiatric: She has a normal mood and affect. Her behavior is normal. Judgment and thought content normal.          Assessment & Plan:   #1 New moderate Right pleural effusion:  I independently reviewed Xray with the patient. She will have thoracocentesis with IR soon with appropriate workup. Hopefully this is NOT an  empyema. Clinically she does not appear overtly infected   #2 Recurrent  eye pain, left toe pain, arthralgias, palpitations on rechallenge with M avium meds  Very strange symptoms complex and potentially some driven by anxiety. Will not challenge with these drugs in near future  #2 Mycobacterium Avium Intracellulare infection and Nocardia  of the lungs sp resection of TWO lobes of lung  We tried to rechallenge.She is lacking much symptoms or other clinical data that would push [place] towards urgent treatment. Will hold off, RTC in next 2 months  #3 New Nocardia infection of the lungs. Sensi are in [place]  Hopefully it was indeed in diseased resected lobes and we can "mop" this up  with the IV rocephin that she has finished  #3 Abnormal EKG : ? Of drug induced vs idiopathic pericarditis, myocarditis she has had no new findings on current regimen and followed by Dr. Orene Desanctis.  #4  Bronchiectasis: sp lobectomy  I spent greater than 40 minutes with the patient including greater than 50% of time in face to face counsel of the patient re her M avium infection, her  Nocardia infection,  Her new pleural effusion, Her bronchiectasis with cavitary lung disease, pericarditis, ,  and her recurrent ssx of  new of eye pain, toe pain, hearing loss and in  coordination of her care.

## 2015-02-16 NOTE — Progress Notes (Signed)
04/18/11- 75 yo F never smoker referred by Dr Reynaldo Minium because of cough. Persistent cough since January. In late February she was treated with 7 days of Levaquin after chest x-ray showed some density. Cough is better in warm moist air, worse when out in pollen or when active and better at night. Cough improved with Levaquin but persists. Notices postnasal drip. Saw ENT/Dr.Wolicki who didn't think there was enough of sinus problem to be of concern. Cough is dry. She denies enlarged glands, fever, sweat or weight loss. Denies history of lung or nasal condition. She has been told that she has some wheeze in the right lung, going back years. She insists she has always been healthy and athletic, never aware of shortness of breath or breathing problems. Denies GERD. Remote history of walking pneumonia. As a small child mother had tuberculosis. No PPD in many years but not remembered  as positive. Chest x-ray 02/26/2011-right-sided infiltrate recommend followup. Chest x-ray 03/07/2011-COPD, prominent linear markings in the right middle lobe may be due to atelectasis or scarring but pneumonia can't be excluded.  05/11/11-  75 yoF never smoker followed for cough     PCP Dr Reynaldo Minium      Husband here Denies seasonal rhinitis complaints. Still some dry cough with a sense there is mucus in her throat. Dr Erik Obey ENT gave Z-Pak then Cleocin now for 30 days. She denies shortness [place] chest 04/18/11- reviewed w/ them-- IMPRESSION:  Diffuse peribronchovascular nodularity, right greater than left,  with areas of bronchiectasis and mild cavitation. 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 Follow-up of the  dominant cavitary lesion in the right lower lobe is recommended in  4-6 weeks to ensure stability, as a developing abscess cannot be  excluded. Similarly, although felt to be less likely, malignancy  cannot be definitively excluded and continued consideration on   follow-up exams is warranted.  Original Report Authenticated By: Luretha Rued, M.D.  PFT 05/10/11- mild obstructive airways disease, small airways, insignificant response to bronchodilator. air trapping, hyperinflation, normal diffusion FEV1 1.81/111%, FEV1/FVC 0.71, FEF 25-75% 1.19/59%. TLC 134%, RV 174%, DLCO 115%.  07/02/11- 75 yoF never smoker followed for cough, +MAIC/ triple therapy    PCP Dr Reynaldo Minium      Husband here Slow 1 little streak of blood in mucus as a single event. Mostly thin clear mucus now. Denies fever, night sweats, nodes. Of her 3 drugs, Biaxin bothers her the most and she thinks it is affecting her hearing. She wears hearing aids already, fitted by the audiologist at [facility] Dr. Kathrin Penner has already checked her eyes on ethambutol. Sputum Cx - 06/07/11- + M.avium CXR 06/12/11- reviewed IMPRESSION:  Stable radiographic appearance of the chest since 03/07/2011.  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 Continued follow-up of the right lower lobe cavitary  lesion is recommended.  Original Report Authenticated By: Randall An, M.D.   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 Feeling well. Not coughing at all-cough was her original complaint. Tolerating medications well as we reduced Biaxin from 1500 mg 3 days per week. That was because of concern about her hearing.  She suspects now there was no connection. She denies fever, sweat, chills. Scant sputum is clear. CXR 08/31/11- reviewed with her IMPRESSION:  1. Persistent cavitary lesion in the right lower lobe. Favor  Mycobacterium avium intracellular. Recommend ongoing radiographic  follow-up.  2. Similar appearance of interstitial thickening and right middle  lobe bronchiectasis.  Original Report Authenticated By: Areta Haber, M.D.   12/21/11- 75 yoF never smoker followed for cough, +MAIC/ triple therapy    PCP Dr Reynaldo Minium  FOLLOWS FOR: recently in hospital-[place] 12/05/11 for hemoptysis; denies any wheezing, SOB, cough, or congestion CT 12/05/11- [place]- RLL cavitary lesion, sub-centimeter satellite foci in RML, RLL, with COPD levaquin was added to her triple therapy ( which began 06/18/11). No prior or subsequent heme.  CXR 08/31/11   IMPRESSION:  1. Persistent cavitary lesion in the right lower lobe. Favor  Mycobacterium avium intracellular. Recommend ongoing radiographic  follow-up.  2. Similar appearance of interstitial thickening and right middle  lobe bronchiectasis.  Original Report Authenticated By: Areta Haber, M.D.   03/20/12- 75 yoF never smoker followed for cough, +MAIC/ triple therapy began 06/18/11    PCP Dr Reynaldo Minium   Husband here  FOLLOWS FOR: feels like she can breathe deeper than before, Had coughing (productive-in mornings;yellow/green to white in color) since last bleeding episode she had(3 times); also head cold recently.Continues triple therapy for [facility]. Third and latest hemoptysis was only slight- in January. Took extra biaxin then. Most days morning cough, scant, clear mucus. Just got over a cold 1 week ago.   05/22/12- 75 yoF never smoker followed for cough, +MAIC/ triple therapy began 06/18/11    PCP Dr Reynaldo Minium   Husband here FOLLOWS FOR: Morning times-has cough spell and gets little aount of phlegm-lite in color. Sputum Cx AFB again POS [facility]  03/21/12 She has felt somewhat better in the last few weeks with more energy but still productive cough with scant yellow sputum. No sweats or fevers, adenopathy, or recent hemoptysis CXR 04/28/12 IMPRESSION:  1. No significant change in cavitary lung lesion within the right  lower lobe.  2. Chronic lung abnormality is stable compared with previous exam.  Original Report Authenticated By: Kerby Moors, M.D.  11/24/12- 76 yoF never smoker followed for cough, +MAIC/ triple therapy began 06/18/11    PCP Dr Reynaldo Minium   Husband here Bronchiectasis. Pt reports breathing has unchanged. for about 1 month she is coughing up more yellow/liquid phlegm since on Rafampin She declines flu vaccine and pneumonia vaccine, reporting large local reaction in the past. Discussed. Denies fever or night sweats. Increased cough productive yellow sputum. No blood. Sputum culture for AFB negative in 6 weeks, obtain 07/25/2012 Has been seeing Infectious Disease:  --azithromycin 500 mg daily  --Ethambutol 700 mg daily  --Rifampin 600 mg daily CT 05/22/12 IMPRESSION:  1. Diffuse peribronchovascular nodularity, right greater than  left, with areas of bronchiectasis and cavitation. 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  2. The dominant cavitary lesion in the right lower lobe has  increased in size from previous exam.  Original Report Authenticated By: Kerby Moors, M.D.  02/16/13- 71 yoF never smoker followed for cough, +MAIC/ triple therapy began 06/18/11    PCP Dr Reynaldo Minium   Husband here FOLLOWS FOR: has been doing well since last visit; husband states she continues to cough alot. AFB cx 11/26/12 again POS MAIC Followed by [facility]/ Dr Wendie Agreste and has been on daily triple therapy since June. We reviewed her x-rays and liver function tests, noting minor elevation of transaminase. She is breathing well, denies fever or sweats" functioning fine". She complains of very productive cough,  especially in the mornings and after sitting. Rare specks of blood. Sputum is usually watery clear with slight yellow. CXR 11/26/12 IMPRESSION:  Stable appearance of cavitary lesion seen in right lower lobe  compared to prior exam. Stable interstitial densities are noted  throughout both lungs most consistent with scarring or fibrosis.  Electronically Signed  By: Sabino Dick M.D.  On: 11/26/2012 12:37  05/25/13- 53 yoF never smoker followed for cough, +MAIC/ triple therapy began 06/18/11    PCP Dr Reynaldo Minium   Husband here                     Lucianne Lei Dam/ Infectious Disease FOLLOWS FOR: Cough-productive-clear to yellow at times, Denies any SOB or wheezing. She had asked Dr Tommy Medal about stopping her meds.   06/30/13- 72 yoF never smoker followed for cough, +MAIC/ triple therapy began 06/18/11    PCP Dr Reynaldo Minium   Husband here                     Lucianne Lei Dam/ Infectious Disease FOLLOWS FOR: review most recent CT with patient; pt states she is breathing well since last visit and continues to abx's. Now almost 2 years on triple therapy. Some cough daily, productive, white. 2 blood spots one month ago. Abdominal bloating better. Thinks sometimes her speech is more hesitant on the days she takes Ritalin. Last positive AFB sputum 11/26/2012. CT 05/29/13- images reviewed with them today IMPRESSION:  Re- demonstrated diffuse peribronchovascular nodularity within the  right greater than left lungs with associated bronchiectasis and  cavitation, particularly within the right lower lobe. 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  Prominent cavitary lesion within the right lower lobe is grossly  stable from recent prior however has increased in size from more  remote prior exams.  Electronically Signed:  By: Lovey Newcomer M.D.  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 Due to  zith? FOLLOWS FOR: No longer on meds for [facility] due to heart issues. Pt states she is coughing now. Next ID appt is in March. Cough dry or scant white. No blood, fever or night sweats for now. She has been told by ID with Pharmacist, that there is NO antibiotic regimen available that doesn't include a macrolide.   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 Due to zith?. Cavitary lesion with history of hemoptysis, RML/RLL lobectomy 09/2014 Infectious Disease note from 12/16/2014 reviewed. They are treating with azithromycin, ethambutol- She says these were stopped because eyes hurt and iregular heart rhythm. No more heme and much less cough since surgery.  . She is on valsartan for her heart. Had right middle and lower lobectomy 09/27/2014 at [facility] to remove right lower  lobe cavity. ID wanted [place] to evaluate her for moderate right pleural effusion, residual now 5 months after surgery.. I reviewed the images with her and husband. There is tracheal shift to the right so this may all be traction effusion, including elevation of the right hemidiaphragm. She notices dyspnea mainly bending over, cleaning under her bed, with no chest pain and no fever. I explained that thoracentesis would include potential risks of pneumothorax and bleeding even with ultrasound guidance. The fluid may be mostly loculated. Even if we are able to draw it off, it may very well return. We can try thoracentesis once. CXR 02/10/2015 IMPRESSION:  1. Moderate right pleural effusion. Postsurgical changes on the right from the previous lobectomies. 2. No convincing pneumonia. No pulmonary edema. Electronically Signed  By: Lajean Manes M.D.  On: 02/10/2015 14:38  ROS-see HPI Constitutional:   No-   weight loss, night sweats, fevers, chills, fatigue, lassitude. HEENT:   No-  headaches, difficulty swallowing, tooth/dental problems, sore  throat,       No-  sneezing, itching, ear ache, nasal congestion, post nasal drip,  CV:  No-   chest pain, orthopnea, PND, swelling in lower extremities, anasarca, dizziness, palpitations Resp: +  shortness of breath with exertion or at rest.              +  productive cough,  little non-productive cough,  No recent coughing up of blood.              No-   change in color of mucus.  No- wheezing.   Skin: No-   rash or lesions. GI:  No-   heartburn, indigestion, abdominal pain, nausea, vomiting,  GU:  MS:  No-   joint pain or swelling.   Neuro-     nothing unusual Psych:  No- change in mood or affect. No depression or anxiety.  No memory loss.  OBJ- Physical Exam  General- Alert, Oriented, Affect-appropriate, Distress- none acute, slender, talkative Skin- rash-none, lesions- none, excoriation- none Lymphadenopathy- none Head- atraumatic            Eyes- Gross vision intact, PERRLA, conjunctivae and secretions clear            Ears- Hearing aides            Nose- Clear, no-Septal dev, mucus, polyps, erosion, perforation             Throat- Mallampati II , mucosa clear , drainage- none, tonsils- atrophic Neck- flexible , trachea midline, no stridor , thyroid nl, carotid no bruit Chest - symmetrical excursion , unlabored           Heart/CV- RRR , no murmur , no gallop  , no rub, nl s1 s2                           - JVD- none , edema- none, stasis changes- none, varices- none           Lung- clear,   without wheeze or rhonchi, cough- none , dullness+ lower 1/3 R lung, rub- none           Chest wall-  Abd-  Br/ Gen/ Rectal- Not done, not indicated Extrem- cyanosis- none, clubbing, none, atrophy- none, strength- nl Neuro- grossly intact to observation

## 2015-02-16 NOTE — Patient Instructions (Signed)
Order- lab- CBC w diff, ProTime INR, LDH, Total protein        Dx pleural efusion  Order- schedule ultrasound guided Right thoracentesis     decompress effusion, send labs for cultures- routine, fungal, AFB and for cytology and cell block, Total protein, LDH

## 2015-02-16 NOTE — Telephone Encounter (Signed)
Per Joellen Jersey- pt can see CDY on 04-18-15 at 11:15 am  LMTCB for the pt

## 2015-02-16 NOTE — Assessment & Plan Note (Signed)
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 We discussed and will try ultrasound guided thoracentesis for labs and cultures. We will see if any volume rermoved is replaced by lung expansion. Otherwise she will need to live with this.

## 2015-02-23 ENCOUNTER — Encounter: Payer: Self-pay | Admitting: Cardiovascular Disease

## 2015-02-24 ENCOUNTER — Encounter: Payer: Self-pay | Admitting: *Deleted

## 2015-02-24 ENCOUNTER — Telehealth: Payer: Self-pay | Admitting: *Deleted

## 2015-02-24 MED ORDER — VALSARTAN 320 MG PO TABS: 320.0000 mg | ORAL_TABLET | Freq: Every day | ORAL | Status: DC

## 2015-02-24 NOTE — Telephone Encounter (Signed)
Rx for Valsartan 320mg  sent to pharmacy.  Patient notified.

## 2015-02-24 NOTE — Telephone Encounter (Signed)
-----   Message from Sanda Klein, MD sent at 02/23/2015  9:13 AM EST ----- Please increase valsartan to 320 mg daily

## 2015-02-28 ENCOUNTER — Ambulatory Visit (HOSPITAL_COMMUNITY)
Admission: RE | Admit: 2015-02-28 | Discharge: 2015-02-28 | Disposition: A | Discharge status: Home / Self Care | Payer: Medicare Other | Source: Ambulatory Visit | Attending: Internal Medicine | Admitting: Internal Medicine

## 2015-02-28 ENCOUNTER — Ambulatory Visit (HOSPITAL_COMMUNITY)
Admission: RE | Admit: 2015-02-28 | Discharge: 2015-02-28 | Disposition: A | Discharge status: Home / Self Care | Payer: Medicare Other | Source: Ambulatory Visit | Attending: General Surgery | Admitting: General Surgery

## 2015-02-28 DIAGNOSIS — Z9889 Other specified postprocedural states: Secondary | ICD-10-CM

## 2015-02-28 DIAGNOSIS — J948 Other specified pleural conditions: Secondary | ICD-10-CM | POA: Insufficient documentation

## 2015-02-28 DIAGNOSIS — J90 Pleural effusion, not elsewhere classified: Secondary | ICD-10-CM | POA: Insufficient documentation

## 2015-02-28 LAB — LACTATE DEHYDROGENASE, PLEURAL OR PERITONEAL FLUID: LD, Fluid: 136 U/L — ABNORMAL HIGH (ref 3–23)

## 2015-02-28 LAB — GRAM STAIN

## 2015-02-28 MED ORDER — LIDOCAINE HCL (PF) 1 % IJ SOLN
INTRAMUSCULAR | Status: AC
  Filled 2015-02-28: qty 10

## 2015-02-28 NOTE — Procedures (Signed)
Ultrasound-guided diagnostic and therapeutic right thoracentesis performed yielding 0.38liters of clear yellow colored fluid. No immediate complications. Follow-up chest x-ray pending.       Daiel Strohecker E 10:35 AM 02/28/2015

## 2015-03-05 LAB — CULTURE, BODY FLUID-BOTTLE

## 2015-03-05 LAB — CULTURE, BODY FLUID W GRAM STAIN -BOTTLE: Culture: NO GROWTH

## 2015-03-07 ENCOUNTER — Encounter: Payer: Self-pay | Admitting: Internal Medicine

## 2015-03-07 NOTE — Telephone Encounter (Signed)
Is there anything in the recent cytology report on my pleural effusion fluid that should concern me? Did not understand the significance of findings. Insight on these tests via phone or my chart would be helpful. Not due to see Dr. Annamaria Boots until April 10.  Please advise, Dr Annamaria Boots, thanks!

## 2015-03-08 NOTE — Telephone Encounter (Signed)
The cells in the pleural fluid which was drawn off look benign. I am not seeing any reason for concern. We will watch over time to see how it acts, but I don't see anything we need to do except keep an eye on it.

## 2015-03-21 ENCOUNTER — Other Ambulatory Visit: Payer: Self-pay | Admitting: *Deleted

## 2015-03-21 MED ORDER — VALSARTAN 320 MG PO TABS: 320.0000 mg | ORAL_TABLET | Freq: Every day | ORAL | Status: DC

## 2015-04-04 ENCOUNTER — Encounter: Payer: Self-pay | Admitting: Infectious Disease

## 2015-04-04 ENCOUNTER — Ambulatory Visit (INDEPENDENT_AMBULATORY_CARE_PROVIDER_SITE_OTHER): Payer: Medicare Other | Admitting: Infectious Disease

## 2015-04-04 VITALS — BP 182/101 | HR 76 | Temp 97.6°F | Wt 104.0 lb

## 2015-04-04 DIAGNOSIS — J90 Pleural effusion, not elsewhere classified: Secondary | ICD-10-CM | POA: Diagnosis not present

## 2015-04-04 DIAGNOSIS — A439 Nocardiosis, unspecified: Secondary | ICD-10-CM

## 2015-04-04 DIAGNOSIS — I4581 Long QT syndrome: Secondary | ICD-10-CM

## 2015-04-04 DIAGNOSIS — A310 Pulmonary mycobacterial infection: Secondary | ICD-10-CM | POA: Diagnosis not present

## 2015-04-04 DIAGNOSIS — I400 Infective myocarditis: Secondary | ICD-10-CM

## 2015-04-04 DIAGNOSIS — J479 Bronchiectasis, uncomplicated: Secondary | ICD-10-CM

## 2015-04-04 DIAGNOSIS — T797XXD Traumatic subcutaneous emphysema, subsequent encounter: Secondary | ICD-10-CM

## 2015-04-04 DIAGNOSIS — M79674 Pain in right toe(s): Secondary | ICD-10-CM

## 2015-04-04 DIAGNOSIS — R9431 Abnormal electrocardiogram [ECG] [EKG]: Secondary | ICD-10-CM

## 2015-04-04 NOTE — Progress Notes (Signed)
[facility]   Chief complaint: difficultytaking full breath and  follow-up for M avium infection and pleural effusion Subjective:      Patient ID: Manley Mason, female    DOB: 1940/11/10, 75 y.o.   MRN: [id number]  HPI  75 year old lady with past medical history significant for diagnosis of Mycobacterium avium infection of the lungs, recent Nocardia  INTERVAL HiSTORY:     Started on therapy with clarithromycin rifampin and ethambutol 3 days per week. She had great difficulty tolerating the Biaxin which was she was taking it twice daily doses on the day she was taking it was reduced to once daily dosing on July 01 2012 Apparently at that time there was concern about toxicity although she states that now that she was found to need simply new hearing aids. Initially while on therapy for Mycobacterium avium showed mprovement in her symptoms of chronic daily cough and sputum production.   In Fall of 2013 while [place] 2013 apparently she had episodes of hemoptysis and was hospitalized due to Hospital in [place] where she was placed in isolation for rule out tuberculosis. Ultimately was realized that she was suffering from Mycobacterium avium infection alone.  She had repeat sputum taken Spring of 2014 and again grow Mycobacterium avium. The organism was sent for susceptibility testing and showed macrolide sensitivity and in vitro synergy with rifampin and ETH in inhibiting growth in lab. Amikacin would also be considered active at S she had.  I had Changed her to  daily therapy with macrolide (Azithromycin) ethambutol and rifampin. Since change she had been initiallycoughing less, she was gaining weight and was  absent fevers.   She saw Dr. Annamaria Boots in November 2014 when she reported 1 month she is coughing up more yellow/liquid phlem since on Rafampin. Chest x-ray was done and showed no changes. Sputum was sent for routine culture which only isolated normal oral  pharyngeal flora. AFB culture is without AFB seen on smear blood cultures in November DID grow M avium again   I had tried to get her on daily RIFABUTIN with continue azithro and ETH but she did not tolerate at all with severe anorexia, and apparent visual problems, stating that "I could not see for several hours."  She went back to Rifampin TIW,  azithro 500mg  daily, ETH 700mg  TU, TH< SA< SU  In the interim she was seen by her Cardiologist Dr. Sallyanne Kuster  who saw her in October and found NEW  deep symmetrical inverted T waves in all the inferior leads as well as V3-V6. The QTC prolongation on EKG that persistd on followup EKG. he was concern for possible antibiotic induced myocarditis and the patient stopped her anti-Mycobacterium drugs and had MRI of her heart performed which was normal echocardiogram was also normal.  Repeat EKG done in the next few days showed resolution of T wave changes and improvement in QT  Since coming off her anti-Mycobacterium avium drugs she had continued to cough on a daily basis   She had CXR that showed expansion of cavitary disease though most recent CXR showed stable findings.   Note her M avium was R to Moxifloxacin with MIC of 4.  It was I to linezolid with MIC of 16  It was S to clarithro with MIC of 2  Further MICs were:  Amikacin 16 Cipro 16 Ethambutol 8 Ethimodate > INH 8 Rifampin >8 Rifabutin 0.5 Streptomcin 32  Combination of ETH with clarithro, erythrom, rifampin, rifabutin all  produced no growth in vitro  See below      She came to our clinic multiple times in  Spring 2016 with worsening cough, with blood tinged sputum and actual hemoptysis, fatigue, lack of energy.  I corresponded with Dr Lorenda Cahill via email and he recommended not starting any therapy until he had a chance to review her case himself when she comes to visit him. She is on schedule to see him in August.  She was worked into clinic before seeing Dr. Lorenda Cahill with   worsening blood tinged sputum and we ultimately decided upon consultation with Dr Lorenda Cahill and with Dr Sallyanne Kuster to re-challenge the patient with Azithro and [facility] which we did and there have been no EKG changes so far to suggest pericarditis , myocarditis or issues with QT prolongation.  Since she started on meds she has had improvement in sputum, no longer blood tinged and she is now able to sleep.  She has now seen Dr Lorenda Cahill who collected sputum sample (though I cannot access it in [place]). Dr. Lorenda Cahill felt upon review of her films that her pulmonary disease was amenable to combined medical and surgical approach with resection of large cavitary areas from lungs to be considered by Dr. Elenor Quinones at [facility].   In the interm she grew Nocardia species from sputum and has been started on zyvox which she was tolerating  But then developed nose blleeds.  She ultimately underwent surgery at [facility] on 09/27/14 with bronchoscopy, thoracoscopy with removal of 2 lobes of lung. She was changed from zyvox to rocephin (to which her Norardia was S) and has remained on this with plan of 6 weeks of IV abx. It is believed that the infected part of lung was removed with surgery. She also grew M avium from prior culture from Dr.Stout's office.  Her cough had DRAMATICALLY improved postoperatively but she did develop significant postoperative subcutaneous emphysema. She had resumed her azithromycin and ETH. She has had some dysphagia due to subcutaneous emphysema but this is improving.  A few visits ago she developed NEW complaints of acute left toe pain, with pain in her left eye and decreased visual acuity, along with malaise, nausea and reduced hearing.She stopped EThambutol and with our advise also the azithromycin to avoid monotherapy for her M avium.   WE had in the interim considered procuring clofazamine. Dr. Brigitte Pulse at [facility] corresponded with me and was very skeptical that the patients eye pain was due to [facility] and  recommended rechallenge with [facility] at 25mg /kg TIW along with continued Azithro first.   Unfortunately when she restarted those meds she began experiencing eye pain, joint pain and palpitations and stopped all f these meds again   She requested a Cxr be done prior to her visit and this showed a moderate right sided pleural effusion.  She underwent diagnostic and therapeutic thoracocentesis by interventional radiology after having been seen by [facility]. Cytology failed to show evidence of malignancy don't see a protein on the pleural fluid LDH was slightly elevated routine cultures failed to grow any organisms fungal cultures are negative and final AFB cultures are no growth to date. Note none of these cultures other than the routine ones appear in the Epic chart I have asked if still status can work on making these labs crossover by the interim they are faxing the results to [place].  Patient has been without fevers chills nausea malaise she did explain some pain with reexpansion of her lungs with the thoracocentesis.  She still continues having daily morning cough but as mentioned above this is dramatically improved compared to previously. Again she was highly intolerant of her anti-Mycobacterium drugs   Past Medical History  Diagnosis Date  . Osteoporosis   . ALLERGIC RHINITIS   . Chronic back pain   . Mycobacterium avium-intracellulare infection ([place])   . Bronchiectasis ([place])   . 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 Hyponatremia 09/15/2014  . Subcutaneous emphysema ([place]) 10/13/2014  . Dysphagia 10/13/2014  . Toe pain 11/15/2014  . Acute left eye pain 11/15/2014  . Hydropneumothorax 11/15/2014  . 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 Cholecystectomy    . Vaginal hysterectomy    . Laminectomy    . Breast biopsy     Family History  Problem Relation Age of Onset  . Breast cancer Mother   . Stroke Father   .  Pulmonary embolism Brother    Social History  Substance Use Topics  . Smoking status: Never Smoker   . Smokeless tobacco: Never Used  . Alcohol Use: 0.6 oz/week    1 Glasses of wine per week     Comment: at dinner     Review of Systems  Constitutional: Negative for diaphoresis, activity change, appetite change, fatigue and unexpected weight change.  HENT: Negative for congestion, sinus pressure and sneezing.   Eyes: Negative for photophobia and visual disturbance.  Respiratory: Positive for cough and shortness of breath. Negative for stridor.   Cardiovascular: Negative for palpitations and leg swelling.  Gastrointestinal: Negative for vomiting, abdominal pain, diarrhea, constipation, blood in stool, abdominal distention and anal bleeding.  Genitourinary: Negative for dysuria, hematuria, flank pain and difficulty urinating.  Musculoskeletal: Negative for back pain, joint swelling, arthralgias and gait problem.  Skin: Negative for color change, pallor and wound.  Neurological: Negative for dizziness, tremors, weakness and light-headedness.  Hematological: Negative for adenopathy. Does not bruise/bleed easily.  Psychiatric/Behavioral: Negative for behavioral problems, sleep disturbance, dysphoric mood, decreased concentration and agitation.       Objective:   Physical Exam  Constitutional: She is oriented to person, place, and time. She appears well-developed and well-nourished. No distress.  HENT:  Head: Normocephalic and atraumatic.  Mouth/Throat: No oropharyngeal exudate.  Eyes: Conjunctivae and EOM are normal. No scleral icterus.  Neck: Normal range of motion. Neck supple.  Cardiovascular: Normal rate, regular rhythm and normal heart sounds.  Exam reveals no gallop and no friction rub.   No murmur heard. Pulmonary/Chest: Effort normal. No respiratory distress. She has decreased breath sounds in the right lower field. She has no rhonchi.    Crackles at bases, prolonged  expiratory phase  Dullness to percussion right base  Abdominal: She exhibits no distension.  Musculoskeletal: She exhibits no edema or tenderness.  Neurological: She is alert and oriented to person, place, and time. She exhibits normal muscle tone. Coordination normal.  Skin: Skin is warm and dry. No rash noted. She is not diaphoretic. No erythema. No pallor.  Psychiatric: She has a normal mood and affect. Her behavior is normal. Judgment and thought content normal.          Assessment & Plan:   #1 Right pleural effusion:  I independently reviewed Xray with the patient again  This does not appear to be infected at all. Mycobacterial cultures however are still incubating and they would also likely grow an medicine such as no cardia.  Will defer to pulmonary  whether or not she might need another thoracentesis to remove more fluid  #2 Recurrent  eye pain, left toe pain, arthralgias, palpitations on rechallenge with M avium meds  Very strange symptoms complex and potentially some driven by anxiety. Will not challenge with these drugs in near future  #2 Mycobacterium Avium Intracellulare infection and Nocardia  of the lungs sp resection of TWO lobes of lung  We tried to rechallenge.She is lacking much symptoms or other clinical data that would push [place] towards urgent treatment.  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 Sensi are in [place]  Hopefully it was indeed in diseased resected lobes and we can "mop" this up with the IV rocephin that she has finished  #3 Abnormal EKG : ? Of drug induced vs idiopathic pericarditis, myocarditis she has had no new findings on current regimen and followed by Dr. Orene Desanctis.  #4  Bronchiectasis: sp lobectomy  I spent greater than 25 minutes with the patient including greater than 50% of time in face to face counsel of the  patient re her M avium infection, her  Nocardia infection,  Her new pleural effusion, Her bronchiectasis with cavitary lung disease, pericarditis, ,  and her recurrent ssx of  new of eye pain, toe pain, hearing loss and in  coordination of her care.

## 2015-04-05 ENCOUNTER — Other Ambulatory Visit: Payer: Self-pay | Admitting: *Deleted

## 2015-04-05 MED ORDER — VALSARTAN 320 MG PO TABS: 320.0000 mg | ORAL_TABLET | Freq: Every day | ORAL | Status: DC

## 2015-04-18 ENCOUNTER — Encounter: Payer: Self-pay | Admitting: Internal Medicine

## 2015-04-18 ENCOUNTER — Ambulatory Visit (INDEPENDENT_AMBULATORY_CARE_PROVIDER_SITE_OTHER): Payer: Medicare Other | Admitting: Internal Medicine

## 2015-04-18 ENCOUNTER — Ambulatory Visit (INDEPENDENT_AMBULATORY_CARE_PROVIDER_SITE_OTHER)
Admission: RE | Admit: 2015-04-18 | Discharge: 2015-04-18 | Disposition: A | Discharge status: Home / Self Care | Payer: Medicare Other | Source: Ambulatory Visit | Attending: Internal Medicine | Admitting: Internal Medicine

## 2015-04-18 VITALS — BP 114/70 | HR 82 | Ht 60.0 in | Wt 104.8 lb

## 2015-04-18 DIAGNOSIS — A310 Pulmonary mycobacterial infection: Secondary | ICD-10-CM | POA: Diagnosis not present

## 2015-04-18 DIAGNOSIS — J90 Pleural effusion, not elsewhere classified: Secondary | ICD-10-CM | POA: Diagnosis not present

## 2015-04-18 NOTE — Progress Notes (Signed)
04/18/11- 75 yo F never smoker referred by Dr Reynaldo Minium because of cough. Persistent cough since January. In late February she was treated with 7 days of Levaquin after chest x-ray showed some density. Cough is better in warm moist air, worse when out in pollen or when active and better at night. Cough improved with Levaquin but persists. Notices postnasal drip. Saw ENT/Dr.Wolicki who didn't think there was enough of sinus problem to be of concern. Cough is dry. She denies enlarged glands, fever, sweat or weight loss. Denies history of lung or nasal condition. She has been told that she has some wheeze in the right lung, going back years. She insists she has always been healthy and athletic, never aware of shortness of breath or breathing problems. Denies GERD. Remote history of walking pneumonia. As a small child mother had tuberculosis. No PPD in many years but not remembered  as positive. Chest x-ray 02/26/2011-right-sided infiltrate recommend followup. Chest x-ray 03/07/2011-COPD, prominent linear markings in the right middle lobe may be due to atelectasis or scarring but pneumonia can't be excluded.  05/11/11-  75 yoF never smoker followed for cough     PCP Dr Reynaldo Minium      Husband here Denies seasonal rhinitis complaints. Still some dry cough with a sense there is mucus in her throat. Dr Erik Obey ENT gave Z-Pak then Cleocin now for 30 days. She denies shortness [place] chest 04/18/11- reviewed w/ them-- IMPRESSION:  Diffuse peribronchovascular nodularity, right greater than left,  with areas of bronchiectasis and mild cavitation. 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 Follow-up of the  dominant cavitary lesion in the right lower lobe is recommended in  4-6 weeks to ensure stability, as a developing abscess cannot be  excluded. Similarly, although felt to be less likely, malignancy  cannot be definitively excluded and continued consideration on   follow-up exams is warranted.  Original Report Authenticated By: Luretha Rued, M.D.  PFT 05/10/11- mild obstructive airways disease, small airways, insignificant response to bronchodilator. air trapping, hyperinflation, normal diffusion FEV1 1.81/111%, FEV1/FVC 0.71, FEF 25-75% 1.19/59%. TLC 134%, RV 174%, DLCO 115%.  07/02/11- 75 yoF never smoker followed for cough, +MAIC/ triple therapy    PCP Dr Reynaldo Minium      Husband here Slow 1 little streak of blood in mucus as a single event. Mostly thin clear mucus now. Denies fever, night sweats, nodes. Of her 3 drugs, Biaxin bothers her the most and she thinks it is affecting her hearing. She wears hearing aids already, fitted by the audiologist at [facility] Dr. Kathrin Penner has already checked her eyes on ethambutol. Sputum Cx - 06/07/11- + M.avium CXR 06/12/11- reviewed IMPRESSION:  Stable radiographic appearance of the chest since 03/07/2011.  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 Continued follow-up of the right lower lobe cavitary  lesion is recommended.  Original Report Authenticated By: Randall An, M.D.   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 Feeling well. Not coughing at all-cough was her original complaint. Tolerating medications well as we reduced Biaxin from 1500 mg 3 days per week. That was because of concern about her hearing.  She suspects now there was no connection. She denies fever, sweat, chills. Scant sputum is clear. CXR 08/31/11- reviewed with her IMPRESSION:  1. Persistent cavitary lesion in the right lower lobe. Favor  Mycobacterium avium intracellular. Recommend ongoing radiographic  follow-up.  2. Similar appearance of interstitial thickening and right middle  lobe bronchiectasis.  Original Report Authenticated By: Areta Haber, M.D.   12/21/11- 75 yoF never smoker followed for cough, +MAIC/ triple therapy    PCP Dr Reynaldo Minium  FOLLOWS FOR: recently in hospital-[place] 12/05/11 for hemoptysis; denies any wheezing, SOB, cough, or congestion CT 12/05/11- [place]- RLL cavitary lesion, sub-centimeter satellite foci in RML, RLL, with COPD levaquin was added to her triple therapy ( which began 06/18/11). No prior or subsequent heme.  CXR 08/31/11   IMPRESSION:  1. Persistent cavitary lesion in the right lower lobe. Favor  Mycobacterium avium intracellular. Recommend ongoing radiographic  follow-up.  2. Similar appearance of interstitial thickening and right middle  lobe bronchiectasis.  Original Report Authenticated By: Areta Haber, M.D.   03/20/12- 75 yoF never smoker followed for cough, +MAIC/ triple therapy began 06/18/11    PCP Dr Reynaldo Minium   Husband here  FOLLOWS FOR: feels like she can breathe deeper than before, Had coughing (productive-in mornings;yellow/green to white in color) since last bleeding episode she had(3 times); also head cold recently.Continues triple therapy for [facility]. Third and latest hemoptysis was only slight- in January. Took extra biaxin then. Most days morning cough, scant, clear mucus. Just got over a cold 1 week ago.   05/22/12- 75 yoF never smoker followed for cough, +MAIC/ triple therapy began 06/18/11    PCP Dr Reynaldo Minium   Husband here FOLLOWS FOR: Morning times-has cough spell and gets little aount of phlegm-lite in color. Sputum Cx AFB again POS [facility]  03/21/12 She has felt somewhat better in the last few weeks with more energy but still productive cough with scant yellow sputum. No sweats or fevers, adenopathy, or recent hemoptysis CXR 04/28/12 IMPRESSION:  1. No significant change in cavitary lung lesion within the right  lower lobe.  2. Chronic lung abnormality is stable compared with previous exam.  Original Report Authenticated By: Kerby Moors, M.D.  11/24/12- 76 yoF never smoker followed for cough, +MAIC/ triple therapy began 06/18/11    PCP Dr Reynaldo Minium   Husband here Bronchiectasis. Pt reports breathing has unchanged. for about 1 month she is coughing up more yellow/liquid phlegm since on Rafampin She declines flu vaccine and pneumonia vaccine, reporting large local reaction in the past. Discussed. Denies fever or night sweats. Increased cough productive yellow sputum. No blood. Sputum culture for AFB negative in 6 weeks, obtain 07/25/2012 Has been seeing Infectious Disease:  --azithromycin 500 mg daily  --Ethambutol 700 mg daily  --Rifampin 600 mg daily CT 05/22/12 IMPRESSION:  1. Diffuse peribronchovascular nodularity, right greater than  left, with areas of bronchiectasis and cavitation. 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  2. The dominant cavitary lesion in the right lower lobe has  increased in size from previous exam.  Original Report Authenticated By: Kerby Moors, M.D.  02/16/13- 71 yoF never smoker followed for cough, +MAIC/ triple therapy began 06/18/11    PCP Dr Reynaldo Minium   Husband here FOLLOWS FOR: has been doing well since last visit; husband states she continues to cough alot. AFB cx 11/26/12 again POS MAIC Followed by Infectious Disease Clinic/ Dr Wendie Agreste and has been on daily triple therapy since June. We reviewed her x-rays and liver function tests, noting minor elevation of transaminase. She is breathing well, denies fever or sweats" functioning fine". She complains of very productive cough,  especially in the mornings and after sitting. Rare specks of blood. Sputum is usually watery clear with slight yellow. CXR 11/26/12 IMPRESSION:  Stable appearance of cavitary lesion seen in right lower lobe  compared to prior exam. Stable interstitial densities are noted  throughout both lungs most consistent with scarring or fibrosis.  Electronically Signed  By: Sabino Dick M.D.  On: 11/26/2012 12:37  05/25/13- 53 yoF never smoker followed for cough, +MAIC/ triple therapy began 06/18/11    PCP Dr Reynaldo Minium   Husband here                     Lucianne Lei Dam/ Infectious Disease FOLLOWS FOR: Cough-productive-clear to yellow at times, Denies any SOB or wheezing. She had asked Dr Tommy Medal about stopping her meds.   06/30/13- 72 yoF never smoker followed for cough, +MAIC/ triple therapy began 06/18/11    PCP Dr Reynaldo Minium   Husband here                     Lucianne Lei Dam/ Infectious Disease FOLLOWS FOR: review most recent CT with patient; pt states she is breathing well since last visit and continues to abx's. Now almost 2 years on triple therapy. Some cough daily, productive, white. 2 blood spots one month ago. Abdominal bloating better. Thinks sometimes her speech is more hesitant on the days she takes Ritalin. Last positive AFB sputum 11/26/2012. CT 05/29/13- images reviewed with them today IMPRESSION:  Re- demonstrated diffuse peribronchovascular nodularity within the  right greater than left lungs with associated bronchiectasis and  cavitation, particularly within the right lower lobe. 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  Prominent cavitary lesion within the right lower lobe is grossly  stable from recent prior however has increased in size from more  remote prior exams.  Electronically Signed:  By: Lovey Newcomer M.D.  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 Due to  zith? FOLLOWS FOR: No longer on meds for [facility] due to heart issues. Pt states she is coughing now. Next ID appt is in March. Cough dry or scant white. No blood, fever or night sweats for now. She has been told by ID with Pharmacist, that there is NO antibiotic regimen available that doesn't include a macrolide.   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 Due to zith?. Cavitary lesion with history of hemoptysis, RML/RLL lobectomy 09/2014 Infectious Disease note from 12/16/2014 reviewed. They are treating with azithromycin, ethambutol- She says these were stopped because eyes hurt and iregular heart rhythm. No more heme and much less cough since surgery.  . She is on valsartan for her heart. Had right middle and lower lobectomy 09/27/2014 at [facility] to remove right lower  lobe cavity. ID wanted [place] to evaluate her for moderate right pleural effusion, residual now 5 months after surgery.. I reviewed the images with her and husband. There is tracheal shift to the right so this may all be traction effusion, including elevation of the right hemidiaphragm. She notices dyspnea mainly bending over, cleaning under her bed, with no chest pain and no fever. I explained that thoracentesis would include potential risks of pneumothorax and bleeding even with ultrasound guidance. The fluid may be mostly loculated. Even if we are able to draw it off, it may very well return. We can try thoracentesis once. CXR 02/10/2015 IMPRESSION:  1. Moderate right pleural effusion. Postsurgical changes on the right from the previous lobectomies. 2. No convincing pneumonia. No pulmonary edema. Electronically Signed  By: Lajean Manes M.D.  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 Due to zith?. Cavitary lesion  with history of hemoptysis, RML/RLL lobectomy 09/2014 for bronchiectasis. FOLLOWS FOR: Pt states she can up and down stairs but when she trys to take a deep breath she is unable to do so.  Dr Tommy Medal noted hearings of fluid in her lungs recently. Last saw ID 3/27- had cultured mycobacterium avium and Nocardia Thoracentesis R  02/28/2015-sterile fluid, benign cytology. She says that thoracentesis procedure was painful and she hopes to avoid repeating it. We reviewed her x-ray images demonstrating residual fluid on the right.  ROS- see HPI Constitutional:   No-   weight loss, night sweats, fevers, chills, fatigue, lassitude. HEENT:   No-  headaches, difficulty swallowing, tooth/dental problems, sore throat,       No-  sneezing, itching, ear ache, nasal congestion, post nasal drip,  CV:  No-   chest pain, orthopnea, PND, swelling in lower extremities, anasarca, dizziness, palpitations Resp: +  shortness of breath with exertion or at rest.              +  productive cough,  little non-productive cough,  No recent coughing up of blood.              No-   change in color of mucus.  No- wheezing.   Skin: No-   rash or lesions. GI:  No-   heartburn, indigestion, abdominal pain, nausea, vomiting,  GU:  MS:  No-   joint pain or swelling.   Neuro-     nothing unusual Psych:  No- change in mood or affect. No depression or anxiety.  No memory loss.  OBJ- Physical Exam  General- Alert, Oriented, Affect-appropriate, Distress- none acute, slender, talkative Skin- rash-none, lesions- none, excoriation- none Lymphadenopathy- none Head- atraumatic            Eyes- Gross vision intact, PERRLA, conjunctivae and secretions clear            Ears- Hearing aides            Nose- Clear, no-Septal dev, mucus, polyps, erosion, perforation             Throat- Mallampati II , mucosa clear , drainage- none, tonsils- atrophic Neck- flexible , trachea midline, no stridor , thyroid nl, carotid no bruit Chest -  symmetrical excursion , unlabored           Heart/CV- RRR , no murmur , no gallop  , no rub, nl s1 s2                           -  JVD- none , edema- none, stasis changes- none, varices- none           Lung- + few crackles, without wheeze or rhonchi, cough- none , dullness+ right base, rub- none           Chest wall-  Abd-  Br/ Gen/ Rectal- Not done, not indicated Extrem- cyanosis- none, clubbing, none, atrophy- none, strength- nl Neuro- grossly intact to observation

## 2015-04-18 NOTE — Patient Instructions (Signed)
Order-  CXR    Dx  Pleural effusion

## 2015-04-19 ENCOUNTER — Telehealth: Payer: Self-pay | Admitting: Internal Medicine

## 2015-04-19 NOTE — Telephone Encounter (Signed)
Notes Recorded by Deneise Lever, MD on 04/18/2015 at 1:36 PM CXR- stable scarring and post-operative changes. No obvious active infection seen.   Called and spoke with pt. Reviewed results and recs. Pt voiced understanding and had no further questions. Nothing further needed.

## 2015-04-19 NOTE — Progress Notes (Signed)
Quick Note:  Called and spoke with pt. Reviewed results and recs. Pt voiced understanding and had no further questions. ______ 

## 2015-04-24 NOTE — Assessment & Plan Note (Signed)
There are chronic postoperative changes after right lower lobectomy, but there is also some residual fluid which was sterile on recent tap. We will follow with occasional chest x-ray. Plan-chest x-ray

## 2015-04-24 NOTE — Assessment & Plan Note (Signed)
Pleural fluid culture negative. Continues following up with Infectious Disease

## 2015-04-25 ENCOUNTER — Encounter: Payer: Self-pay | Admitting: Internal Medicine

## 2015-04-25 NOTE — Telephone Encounter (Signed)
Some of the interval xray studies had been done at [facility].  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 It doesn't seem to have progressed much, if at all. There is a shadow in the lower right, left from the lobectomy surgery. It is hard to tell if there is any free fluid there now, or just scarring. Since we have tapped it once, I suggest we just follow this with occasional CXR. There is scarring and retraction in the upper right lung, which has developed in the last year. This concerns me. I'm not sure if it is a result of the surgery which took out the lung cavity.  The best way to evaluate all of these issues would be to go ahead with another CT scan, no contrast,   For dx atelectasis and [facility]

## 2015-04-25 NOTE — Telephone Encounter (Signed)
4.17.17 mychart message from pt: Message     Re DG Chest 2 View 04/18/15: Radiologist report notes refraction of right upper lobe consistent with atelectasis and scarring. Does this mean the right upper lobe has collapsed? Report also notes persistent right pleural thickening consistent with pleural effusion and/or scarring. In the IMPRESSION section, he states right base pleural thickening versus pleural effusion. Does this mean I have fluid or just scarring?  Finally, did the x-ray indicate to you any spread of MAC in the left lung?   Pt last seen in the office 4.10.17 : Patient Instructions     Order- CXR Dx Pleural effusion   Per 4.11.17 phone note: Call Documentation      [name], CMA at 04/19/2015 11:34 AM     Status: Signed       Expand All Collapse All    Notes Recorded by Deneise Lever, MD on 04/18/2015 at 1:36 PM CXR- stable scarring and post-operative changes. No obvious active infection seen.   Called and spoke with pt. Reviewed results and recs. Pt voiced understanding and had no further questions. Nothing further needed.        Please advise Dr Annamaria Boots, thank you.

## 2015-04-26 NOTE — Telephone Encounter (Signed)
My main concern is the recent finding of volume loss in the upper right lung, since I don't know what is causing it. It may be an innocent result of the surgery she had, but sometimes this pattern is seen with airway blockage, as from a tumor.  I would favor getting the CT. If it looks ok, then we can follow up at our scheduled office visit with less concern.

## 2015-04-26 NOTE — Telephone Encounter (Signed)
Dr Annamaria Boots please advise on urgency of having the CT done now vs later. Pt responded to e-mail with questions regarding what benefit there will be from having the CT now vs later.   Will the CT Scan help in determining further treatment or is it unnecessary at this time? If you determine it will help in determining how to move forward, then please schedule it. Otherwise, I'll just wait until next visit. I am feeling well at this time.

## 2015-04-27 NOTE — Telephone Encounter (Signed)
Pt is willing to do the CT. What kind of CT would you like done? With contrast, without contrast?

## 2015-05-02 ENCOUNTER — Telehealth: Payer: Self-pay | Admitting: Internal Medicine

## 2015-05-02 DIAGNOSIS — A310 Pulmonary mycobacterial infection: Secondary | ICD-10-CM

## 2015-05-02 DIAGNOSIS — J9811 Atelectasis: Secondary | ICD-10-CM

## 2015-05-02 NOTE — Telephone Encounter (Signed)
Per chart CT had not been ordered.  Order placed per CY.  Please advise pt that order has been placed and someone will be calling her in the next few days for scheduling. Please route to [facility] for follow up. LMTCB

## 2015-05-03 NOTE — Telephone Encounter (Signed)
Per chart, ct already scheduled.  Nothing further needed.

## 2015-05-04 ENCOUNTER — Inpatient Hospital Stay: Admission: RE | Admit: 2015-05-04 | Payer: Medicare Other | Source: Ambulatory Visit

## 2015-05-05 ENCOUNTER — Ambulatory Visit (INDEPENDENT_AMBULATORY_CARE_PROVIDER_SITE_OTHER)
Admission: RE | Admit: 2015-05-05 | Discharge: 2015-05-05 | Disposition: A | Discharge status: Home / Self Care | Payer: Medicare Other | Source: Ambulatory Visit | Attending: Internal Medicine | Admitting: Internal Medicine

## 2015-05-05 DIAGNOSIS — J9811 Atelectasis: Secondary | ICD-10-CM | POA: Diagnosis not present

## 2015-05-05 DIAGNOSIS — J90 Pleural effusion, not elsewhere classified: Secondary | ICD-10-CM | POA: Diagnosis not present

## 2015-05-05 DIAGNOSIS — A310 Pulmonary mycobacterial infection: Secondary | ICD-10-CM | POA: Diagnosis not present

## 2015-05-09 ENCOUNTER — Other Ambulatory Visit: Payer: Self-pay

## 2015-05-09 DIAGNOSIS — Z1231 Encounter for screening mammogram for malignant neoplasm of breast: Secondary | ICD-10-CM

## 2015-05-25 ENCOUNTER — Ambulatory Visit (INDEPENDENT_AMBULATORY_CARE_PROVIDER_SITE_OTHER): Payer: Medicare Other | Admitting: Internal Medicine

## 2015-05-25 ENCOUNTER — Encounter: Payer: Self-pay | Admitting: Internal Medicine

## 2015-05-25 VITALS — BP 148/86 | HR 86 | Ht 60.0 in | Wt 104.2 lb

## 2015-05-25 DIAGNOSIS — J90 Pleural effusion, not elsewhere classified: Secondary | ICD-10-CM

## 2015-05-25 NOTE — Assessment & Plan Note (Signed)
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 Hopefully it will change only very slowly. We discussed management of cough in this context.

## 2015-05-25 NOTE — Patient Instructions (Signed)
Make follow up as planned with your infectious disease doctor  Please call here as needed

## 2015-05-25 NOTE — Assessment & Plan Note (Signed)
Areas of loculated effusion residual after thoracotomy to resect a cavitary lung lesion. Plan-continue to observation

## 2015-05-25 NOTE — Progress Notes (Signed)
04/18/11- 75 yo F never smoker referred by Dr Reynaldo Minium because of cough. Persistent cough since January. In late February she was treated with 7 days of Levaquin after chest x-ray showed some density. Cough is better in warm moist air, worse when out in pollen or when active and better at night. Cough improved with Levaquin but persists. Notices postnasal drip. Saw ENT/Dr.Wolicki who didn't think there was enough of sinus problem to be of concern. Cough is dry. She denies enlarged glands, fever, sweat or weight loss. Denies history of lung or nasal condition. She has been told that she has some wheeze in the right lung, going back years. She insists she has always been healthy and athletic, never aware of shortness of breath or breathing problems. Denies GERD. Remote history of walking pneumonia. As a small child mother had tuberculosis. No PPD in many years but not remembered  as positive. Chest x-ray 02/26/2011-right-sided infiltrate recommend followup. Chest x-ray 03/07/2011-COPD, prominent linear markings in the right middle lobe may be due to atelectasis or scarring but pneumonia can't be excluded.  05/11/11-  75 yoF never smoker followed for cough     PCP Dr Reynaldo Minium      Husband here Denies seasonal rhinitis complaints. Still some dry cough with a sense there is mucus in her throat. Dr Erik Obey ENT gave Z-Pak then Cleocin now for 30 days. She denies shortness [place] chest 04/18/11- reviewed w/ them-- IMPRESSION:  Diffuse peribronchovascular nodularity, right greater than left,  with areas of bronchiectasis and mild cavitation. 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 Follow-up of the  dominant cavitary lesion in the right lower lobe is recommended in  4-6 weeks to ensure stability, as a developing abscess cannot be  excluded. Similarly, although felt to be less likely, malignancy  cannot be definitively excluded and continued consideration on   follow-up exams is warranted.  Original Report Authenticated By: Luretha Rued, M.D.  PFT 05/10/11- mild obstructive airways disease, small airways, insignificant response to bronchodilator. air trapping, hyperinflation, normal diffusion FEV1 1.81/111%, FEV1/FVC 0.71, FEF 25-75% 1.19/59%. TLC 134%, RV 174%, DLCO 115%.  07/02/11- 75 yoF never smoker followed for cough, +MAIC/ triple therapy    PCP Dr Reynaldo Minium      Husband here Slow 1 little streak of blood in mucus as a single event. Mostly thin clear mucus now. Denies fever, night sweats, nodes. Of her 3 drugs, Biaxin bothers her the most and she thinks it is affecting her hearing. She wears hearing aids already, fitted by the audiologist at [facility] Dr. Kathrin Penner has already checked her eyes on ethambutol. Sputum Cx - 06/07/11- + M.avium CXR 06/12/11- reviewed IMPRESSION:  Stable radiographic appearance of the chest since 03/07/2011.  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 Continued follow-up of the right lower lobe cavitary  lesion is recommended.  Original Report Authenticated By: Randall An, M.D.   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 Feeling well. Not coughing at all-cough was her original complaint. Tolerating medications well as we reduced Biaxin from 1500 mg 3 days per week. That was because of concern about her hearing.  She suspects now there was no connection. She denies fever, sweat, chills. Scant sputum is clear. CXR 08/31/11- reviewed with her IMPRESSION:  1. Persistent cavitary lesion in the right lower lobe. Favor  Mycobacterium avium intracellular. Recommend ongoing radiographic  follow-up.  2. Similar appearance of interstitial thickening and right middle  lobe bronchiectasis.  Original Report Authenticated By: Areta Haber, M.D.   12/21/11-  75 yoF never smoker followed for cough, +MAIC/ triple therapy    PCP Dr Reynaldo Minium  FOLLOWS FOR: recently in hospital-[place] 12/05/11 for hemoptysis; denies any wheezing, SOB, cough, or congestion CT 12/05/11- [place]- RLL cavitary lesion, sub-centimeter satellite foci in RML, RLL, with COPD levaquin was added to her triple therapy ( which began 06/18/11). No prior or subsequent heme.  CXR 08/31/11   IMPRESSION:  1. Persistent cavitary lesion in the right lower lobe. Favor  Mycobacterium avium intracellular. Recommend ongoing radiographic  follow-up.  2. Similar appearance of interstitial thickening and right middle  lobe bronchiectasis.  Original Report Authenticated By: Areta Haber, M.D.   03/20/12- 75 yoF never smoker followed for cough, +MAIC/ triple therapy began 06/18/11    PCP Dr Reynaldo Minium   Husband here  FOLLOWS FOR: feels like she can breathe deeper than before, Had coughing (productive-in mornings;yellow/green to white in color) since last bleeding episode she had(3 times); also head cold recently.Continues triple therapy for [facility]. Third and latest hemoptysis was only slight- in January. Took extra biaxin then. Most days morning cough, scant, clear mucus. Just got over a cold 1 week ago.   05/22/12- 75 yoF never smoker followed for cough, +MAIC/ triple therapy began 06/18/11    PCP Dr Reynaldo Minium   Husband here FOLLOWS FOR: Morning times-has cough spell and gets little aount of phlegm-lite in color. Sputum Cx AFB again POS [facility]  03/21/12 She has felt somewhat better in the last few weeks with more energy but still productive cough with scant yellow sputum. No sweats or fevers, adenopathy, or recent hemoptysis CXR 04/28/12 IMPRESSION:  1. No significant change in cavitary lung lesion within the right  lower lobe.  2. Chronic lung abnormality is stable compared with previous exam.  Original Report Authenticated By: Kerby Moors, M.D.  11/24/12- 76 yoF never smoker followed for cough, +MAIC/ triple therapy began 06/18/11    PCP Dr Reynaldo Minium   Husband here Bronchiectasis. Pt reports breathing has unchanged. for about 1 month she is coughing up more yellow/liquid phlegm since on Rafampin She declines flu vaccine and pneumonia vaccine, reporting large local reaction in the past. Discussed. Denies fever or night sweats. Increased cough productive yellow sputum. No blood. Sputum culture for AFB negative in 6 weeks, obtain 07/25/2012 Has been seeing Infectious Disease:  --azithromycin 500 mg daily  --Ethambutol 700 mg daily  --Rifampin 600 mg daily CT 05/22/12 IMPRESSION:  1. Diffuse peribronchovascular nodularity, right greater than  left, with areas of bronchiectasis and cavitation. 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  2. The dominant cavitary lesion in the right lower lobe has  increased in size from previous exam.  Original Report Authenticated By: Kerby Moors, M.D.  02/16/13- 71 yoF never smoker followed for cough, +MAIC/ triple therapy began 06/18/11    PCP Dr Reynaldo Minium   Husband here FOLLOWS FOR: has been doing well since last visit; husband states she continues to cough alot. AFB cx 11/26/12 again POS MAIC Followed by [facility] Disease [facility]/ Dr Wendie Agreste and has been on daily triple therapy since June. We reviewed her x-rays and liver function tests, noting minor elevation of transaminase. She is breathing well, denies fever or sweats" functioning fine". She complains of very productive cough,  especially in the mornings and after sitting. Rare specks of blood. Sputum is usually watery clear with slight yellow. CXR 11/26/12 IMPRESSION:  Stable appearance of cavitary lesion seen in right lower lobe  compared to prior exam. Stable interstitial densities are noted  throughout both lungs most consistent with scarring or fibrosis.  Electronically Signed  By: Sabino Dick M.D.  On: 11/26/2012 12:37  05/25/13- 53 yoF never smoker followed for cough, +MAIC/ triple therapy began 06/18/11    PCP Dr Reynaldo Minium   Husband here                     Lucianne Lei Dam/ Infectious Disease FOLLOWS FOR: Cough-productive-clear to yellow at times, Denies any SOB or wheezing. She had asked Dr Tommy Medal about stopping her meds.   06/30/13- 72 yoF never smoker followed for cough, +MAIC/ triple therapy began 06/18/11    PCP Dr Reynaldo Minium   Husband here                     Lucianne Lei Dam/ Infectious Disease FOLLOWS FOR: review most recent CT with patient; pt states she is breathing well since last visit and continues to abx's. Now almost 2 years on triple therapy. Some cough daily, productive, white. 2 blood spots one month ago. Abdominal bloating better. Thinks sometimes her speech is more hesitant on the days she takes Ritalin. Last positive AFB sputum 11/26/2012. CT 05/29/13- images reviewed with them today IMPRESSION:  Re- demonstrated diffuse peribronchovascular nodularity within the  right greater than left lungs with associated bronchiectasis and  cavitation, particularly within the right lower lobe. 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  Prominent cavitary lesion within the right lower lobe is grossly  stable from recent prior however has increased in size from more  remote prior exams.  Electronically Signed:  By: Lovey Newcomer M.D.  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 Due to  zith? FOLLOWS FOR: No longer on meds for [facility] due to heart issues. Pt states she is coughing now. Next ID appt is in March. Cough dry or scant white. No blood, fever or night sweats for now. She has been told by ID with Pharmacist, that there is NO antibiotic regimen available that doesn't include a macrolide.   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 Due to zith?. Cavitary lesion with history of hemoptysis, RML/RLL lobectomy 09/2014 Infectious Disease note from 12/16/2014 reviewed. They are treating with azithromycin, ethambutol- She says these were stopped because eyes hurt and iregular heart rhythm. No more heme and much less cough since surgery.  . She is on valsartan for her heart. Had right middle and lower lobectomy 09/27/2014 at [facility] to remove right lower  lobe cavity. ID wanted [place] to evaluate her for moderate right pleural effusion, residual now 5 months after surgery.. I reviewed the images with her and husband. There is tracheal shift to the right so this may all be traction effusion, including elevation of the right hemidiaphragm. She notices dyspnea mainly bending over, cleaning under her bed, with no chest pain and no fever. I explained that thoracentesis would include potential risks of pneumothorax and bleeding even with ultrasound guidance. The fluid may be mostly loculated. Even if we are able to draw it off, it may very well return. We can try thoracentesis once. CXR 02/10/2015 IMPRESSION:  1. Moderate right pleural effusion. Postsurgical changes on the right from the previous lobectomies. 2. No convincing pneumonia. No pulmonary edema. Electronically Signed  By: Lajean Manes M.D.  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 Due to zith?. Cavitary lesion  with history of hemoptysis, RML/RLL lobectomy 09/2014 for bronchiectasis. FOLLOWS FOR: Pt states she can up and down stairs but when she trys to take a deep breath she is unable to do so.  Dr Tommy Medal noted hearings of fluid in her lungs recently. Last saw ID 3/27- had cultured mycobacterium avium and Nocardia Thoracentesis R  02/28/2015-sterile fluid, benign cytology. She says that thoracentesis procedure was painful and she hopes to avoid repeating it. We reviewed her x-ray images demonstrating residual fluid on the right.  05/25/15-75 year old female never smoker followed for cough +MAIC/ triple therapy began 06/18/11- dc'd 2015 by Cardiology due to myocarditis ? Due to zith?. Cavitary lesion with history of hemoptysis, RML/RLL lobectomy 09/2014 for bronchiectasis, right pleural effusion FOLLOWS FOR: Pt reports having a head cold and low grade fever over the past weekend. C/o PND. Pt does note some cough x 1 month.  Baseline cough is productive of scant white sputum. She is getting over the recent cold. We reviewed her CT report. She has a copy and focused especially on evidence of progression and bilateral involvement. She has been under the care of infectious disease here and at [facility] and realizes that within the limits of her medication intolerances, there is little additional to offer her. She does not currently recognize weight loss or night sweats CT chest 05/05/2015 IMPRESSION: 1. Small loculated apical right pleural effusion. Small loculated basilar right pleural effusion with associated mild smooth posterior basilar right pleural thickening. No appreciable pleural nodules. 2. 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 3. One vessel coronary  atherosclerosis. Electronically Signed  By: Ilona Sorrel M.D.  On: 05/05/2015 10:27  ROS- see HPI Constitutional:   No-   weight loss, night sweats, fevers, chills, fatigue, lassitude. HEENT:   No-  headaches, difficulty swallowing, tooth/dental problems, sore throat,       No-  sneezing, itching, ear ache, nasal congestion, post nasal drip,  CV:  No-   chest pain, orthopnea, PND, swelling in lower extremities, anasarca, dizziness, palpitations Resp: +  shortness of breath with exertion or at rest.              +  productive cough,  little non-productive cough,  No recent coughing up of blood.              No-   change  in color of mucus.  No- wheezing.   Skin: No-   rash or lesions. GI:  No-   heartburn, indigestion, abdominal pain, nausea, vomiting,  GU:  MS:  No-   joint pain or swelling.   Neuro-     nothing unusual Psych:  No- change in mood or affect. No depression or anxiety.  No memory loss.  OBJ- Physical Exam  General- Alert, Oriented, Affect-appropriate, Distress- none acute, +slender, talkative Skin- rash-none, lesions- none, excoriation- none Lymphadenopathy- none Head- atraumatic            Eyes- Gross vision intact, PERRLA, conjunctivae and secretions clear            Ears- Hearing aides            Nose- Clear, no-Septal dev, mucus, polyps, erosion, perforation             Throat- Mallampati II , mucosa clear , drainage- none, tonsils- atrophic Neck- flexible , trachea midline, no stridor , thyroid nl, carotid no bruit Chest - symmetrical excursion , unlabored           Heart/CV- RRR , no murmur , no gallop  , no rub, nl s1 s2                           - JVD- none , edema- none, stasis changes- none, varices- none           Lung- + few crackles in bases, without wheeze or rhonchi, cough- none , dullness+ right base, rub- none           Chest wall-  Abd-  Br/ Gen/ Rectal- Not done, not indicated Extrem- cyanosis- none, clubbing, none, atrophy- none, strength-  nl Neuro- grossly intact to observation

## 2015-06-07 ENCOUNTER — Ambulatory Visit
Admission: RE | Admit: 2015-06-07 | Discharge: 2015-06-07 | Disposition: A | Discharge status: Home / Self Care | Payer: Medicare Other | Source: Ambulatory Visit

## 2015-06-07 DIAGNOSIS — Z1231 Encounter for screening mammogram for malignant neoplasm of breast: Secondary | ICD-10-CM | POA: Diagnosis not present

## 2015-06-09 ENCOUNTER — Other Ambulatory Visit: Payer: Self-pay | Admitting: Internal Medicine

## 2015-06-09 DIAGNOSIS — R928 Other abnormal and inconclusive findings on diagnostic imaging of breast: Secondary | ICD-10-CM

## 2015-06-14 ENCOUNTER — Ambulatory Visit
Admission: RE | Admit: 2015-06-14 | Discharge: 2015-06-14 | Disposition: A | Discharge status: Home / Self Care | Payer: Medicare Other | Source: Ambulatory Visit | Attending: Internal Medicine | Admitting: Internal Medicine

## 2015-06-14 ENCOUNTER — Other Ambulatory Visit: Payer: Self-pay | Admitting: Internal Medicine

## 2015-06-14 DIAGNOSIS — N63 Unspecified lump in breast: Secondary | ICD-10-CM | POA: Diagnosis not present

## 2015-06-14 DIAGNOSIS — R928 Other abnormal and inconclusive findings on diagnostic imaging of breast: Secondary | ICD-10-CM

## 2015-06-14 LAB — HM MAMMOGRAPHY

## 2015-06-15 ENCOUNTER — Other Ambulatory Visit: Payer: Medicare Other

## 2015-06-16 ENCOUNTER — Ambulatory Visit
Admission: RE | Admit: 2015-06-16 | Discharge: 2015-06-16 | Disposition: A | Discharge status: Home / Self Care | Payer: Medicare Other | Source: Ambulatory Visit | Attending: Internal Medicine | Admitting: Internal Medicine

## 2015-06-16 ENCOUNTER — Other Ambulatory Visit: Payer: Self-pay | Admitting: Internal Medicine

## 2015-06-16 DIAGNOSIS — D242 Benign neoplasm of left breast: Secondary | ICD-10-CM | POA: Diagnosis not present

## 2015-06-16 DIAGNOSIS — R928 Other abnormal and inconclusive findings on diagnostic imaging of breast: Secondary | ICD-10-CM

## 2015-06-16 DIAGNOSIS — N63 Unspecified lump in breast: Secondary | ICD-10-CM | POA: Diagnosis not present

## 2015-06-29 ENCOUNTER — Other Ambulatory Visit: Payer: Self-pay | Admitting: General Surgery

## 2015-06-29 DIAGNOSIS — D242 Benign neoplasm of left breast: Secondary | ICD-10-CM

## 2015-08-02 LAB — ACID-FAST (MYCOBACTERIA) SMEAR AND CULTURE WITH REFLEX TO IDENTIFICATION

## 2015-08-04 ENCOUNTER — Ambulatory Visit (INDEPENDENT_AMBULATORY_CARE_PROVIDER_SITE_OTHER)
Admission: RE | Admit: 2015-08-04 | Discharge: 2015-08-04 | Disposition: A | Discharge status: Home / Self Care | Payer: Medicare Other | Source: Ambulatory Visit | Attending: Internal Medicine | Admitting: Internal Medicine

## 2015-08-04 ENCOUNTER — Ambulatory Visit (INDEPENDENT_AMBULATORY_CARE_PROVIDER_SITE_OTHER): Payer: Medicare Other | Admitting: Internal Medicine

## 2015-08-04 ENCOUNTER — Encounter: Payer: Self-pay | Admitting: Internal Medicine

## 2015-08-04 VITALS — BP 112/68 | HR 77 | Ht 60.0 in | Wt 107.4 lb

## 2015-08-04 DIAGNOSIS — J479 Bronchiectasis, uncomplicated: Secondary | ICD-10-CM | POA: Diagnosis not present

## 2015-08-04 DIAGNOSIS — Q893 Situs inversus: Secondary | ICD-10-CM

## 2015-08-04 DIAGNOSIS — A310 Pulmonary mycobacterial infection: Secondary | ICD-10-CM

## 2015-08-04 DIAGNOSIS — R0602 Shortness of breath: Secondary | ICD-10-CM | POA: Diagnosis not present

## 2015-08-04 NOTE — Patient Instructions (Addendum)
Order- CXR   Dx Chronic bronchiectasis, hx RLL lobectomy  Order- office spirometry    Ok to keep appointment September 19

## 2015-08-04 NOTE — Progress Notes (Signed)
04/18/11- 75 yo F never smoker   02/16/2015-75 year old female never smoker followed for cough,+MAIC/ triple therapy began 06/18/11- dc'd 2015 by Cardiology due to myocarditis ? Due to zith?. Cavitary lesion with history of hemoptysis, RML/RLL lobectomy 09/2014 Infectious Disease note from 12/16/2014 reviewed. They are treating with azithromycin, ethambutol- She says these were stopped because eyes hurt and iregular heart rhythm. No more heme and much less cough since surgery.  . She is on valsartan for her heart. Had right middle and lower lobectomy 09/27/2014 at [facility] to remove right lower lobe cavity. ID wanted [place] to evaluate her for moderate right pleural effusion, residual now 5 months after surgery.. I reviewed the images with her and husband. There is tracheal shift to the right so this may all be traction effusion, including elevation of the right hemidiaphragm. She notices dyspnea mainly bending over, cleaning under her bed, with no chest pain and no fever. I explained that thoracentesis would include potential risks of pneumothorax and bleeding even with ultrasound guidance. The fluid may be mostly loculated. Even if we are able to draw it off, it may very well return. We can try thoracentesis once. CXR 02/10/2015 IMPRESSION:  1. Moderate right pleural effusion. Postsurgical changes on the right from the previous lobectomies. 2. No convincing pneumonia. No pulmonary edema. Electronically Signed  By: Lajean Manes M.D.  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 Due to zith?. Cavitary lesion with history of hemoptysis, RML/RLL lobectomy 09/2014 for bronchiectasis. FOLLOWS FOR: Pt states she can up and down stairs but when she trys to take a deep breath she is unable to do so.  Dr Tommy Medal noted hearings of fluid in her lungs recently. Last saw ID 3/27- had cultured  mycobacterium avium and Nocardia Thoracentesis R  02/28/2015-sterile fluid, benign cytology. She says that thoracentesis procedure was painful and she hopes to avoid repeating it. We reviewed her x-ray images demonstrating residual fluid on the right.  05/25/15-75 year old female never smoker followed for cough +MAIC/ triple therapy began 06/18/11- dc'd 2015 by Cardiology due to myocarditis ? Due to zith?. Cavitary lesion with history of hemoptysis, RML/RLL lobectomy 09/2014 for bronchiectasis, right pleural effusion FOLLOWS FOR: Pt reports having a head cold and low grade fever over the past weekend. C/o PND. Pt does note some cough x 1 month.  Baseline cough is productive of scant white sputum. She is getting over the recent cold. We reviewed her CT report. She has a copy and focused especially on evidence of progression and bilateral involvement. She has been under the care of infectious disease here and at [facility] and realizes that within the limits of her medication intolerances, there is little additional to offer her. She does not currently recognize weight loss or night sweats CT chest 05/05/2015 IMPRESSION: 1. Small loculated apical right pleural effusion. Small loculated basilar right pleural effusion with associated mild smooth posterior basilar right pleural thickening. No appreciable pleural nodules. 2. 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 3. One vessel coronary atherosclerosis. Electronically Signed  By: Ilona Sorrel M.D.  On: 05/05/2015 10:27  08/04/2015-75 year old female never smoker followed for cough +MAIC/ triple therapy began 06/18/11- dc'd 2015 by Cardiology due to myocarditis ? Due to zith?. Cavitary lesion with  history of hemoptysis  resected, RML/RLL lobectomy 09/2014 for bronchiectasis, right pleural effusion ACUTE VISIT: Pt has noticed her breathing starting to affect her-? fluid build up. Pt is due for breast mass to be removed in few months as well. Feels that she can't take a comfortable deep breath she wants to make sure she is okay for pending surgery. Had problems with anesthesia at [facility] for resection of a cavitary nodule. She thinks the problem was multiple pain medications leading to confusion. Mild morning cough scant clear sputum, no fever, night sweats or chill. No blood or adenopathy. Denies palpitation or exertional chest pain. She understands Infectious Disease feels they have nothing left to offer for her [facility]. Office Spirometry 08/04/2015-mild obstructive airways disease. FVC 1.91/81%, FEV1 1.38/79%, FEV1/FVC 0.7 to, FEF 25-75 percent 0.94/65%.  ROS- see HPI Constitutional:   No-   weight loss, night sweats, fevers, chills, fatigue, lassitude. HEENT:   No-  headaches, difficulty swallowing, tooth/dental problems, sore throat,       No-  sneezing, itching, ear ache, nasal congestion, post nasal drip,  CV:  No-   chest pain, orthopnea, PND, swelling in lower extremities, anasarca, dizziness, palpitations Resp: +  shortness of breath with exertion or at rest.              +  productive cough,  little non-productive cough,  No recent coughing up of blood.              No-   change in color of mucus.  No- wheezing.   Skin: No-   rash or lesions. GI:  No-   heartburn, indigestion, abdominal pain, nausea, vomiting,  GU:  MS:  No-   joint pain or swelling.   Neuro-     nothing unusual Psych:  No- change in mood or affect. No depression or anxiety.  No memory loss.  OBJ- Physical Exam  General- Alert, Oriented, Affect-appropriate, Distress- none acute, +slender, talkative Skin- rash-none, lesions- none, excoriation- none Lymphadenopathy- none Head- atraumatic            Eyes- Gross vision intact, PERRLA,  conjunctivae and secretions clear            Ears- Hearing aides            Nose- Clear, no-Septal dev, mucus, polyps, erosion, perforation             Throat- Mallampati II , mucosa clear , drainage- none, tonsils- atrophic Neck- flexible , trachea midline, no stridor , thyroid nl, carotid no bruit Chest - symmetrical excursion , unlabored           Heart/CV- RRR , no murmur , no gallop  , no rub, nl s1 s2                           - JVD- none , edema- none, stasis changes- none, varices- none           Lung- + few crackles in bases, without wheeze or rhonchi, cough- none , dullness+ right base, rub- none           Chest wall-  Abd-  Br/ Gen/ Rectal- Not done, not indicated Extrem- cyanosis- none, clubbing, none, atrophy- none, strength- nl Neuro- grossly intact to observation

## 2015-08-10 ENCOUNTER — Other Ambulatory Visit: Payer: Self-pay | Admitting: General Surgery

## 2015-08-10 DIAGNOSIS — D242 Benign neoplasm of left breast: Secondary | ICD-10-CM

## 2015-08-19 ENCOUNTER — Telehealth: Payer: Self-pay | Admitting: Cardiovascular Disease

## 2015-08-19 ENCOUNTER — Ambulatory Visit: Payer: Medicare Other | Admitting: Internal Medicine

## 2015-08-19 NOTE — Telephone Encounter (Signed)
Returned call to patient she stated her B/P has been elevated ranging 168/100,170/90,150/80 pulse 88,77.Stated monitor read heart rate irregular.Stated she was not aware.Stated she has a red rash on her back.Itchy all over, stomach cramps with diarrhea.Stated she is scheduled to have seed implanted in left breast 10/03/15.Spoke to Dr.Croitoru he advised continue same medications.Monitor B/P.Appointment scheduled with Dr.Croitoru 09/09/15 at 11:15 am.

## 2015-08-19 NOTE — Telephone Encounter (Signed)
New Message  Pt c/o BP issue: STAT if pt c/o blurred vision, one-sided weakness or slurred speech  1. What are your last 5 BP readings? 173/83- this morning, 146/81-Yesterday 2pm,   2. Are you having any other symptoms (ex. Dizziness, headache, blurred vision, passed out)? No  3. What is your BP issue? Pt stated it was a skip in her heart beat and only time pt has known.  Pt verbalized husband has appt on Friday and was wondering if MD C. Could check her BP that day as well.

## 2015-08-26 ENCOUNTER — Encounter: Payer: Self-pay | Admitting: General Practice

## 2015-09-09 ENCOUNTER — Ambulatory Visit (INDEPENDENT_AMBULATORY_CARE_PROVIDER_SITE_OTHER): Payer: Medicare Other | Admitting: Cardiovascular Disease

## 2015-09-09 VITALS — BP 160/92 | HR 77 | Ht 60.0 in | Wt 105.8 lb

## 2015-09-09 DIAGNOSIS — I10 Essential (primary) hypertension: Secondary | ICD-10-CM

## 2015-09-09 MED ORDER — VALSARTAN 320 MG PO TABS: 320.0000 mg | ORAL_TABLET | Freq: Every day | ORAL | 3 refills | Status: DC

## 2015-09-09 NOTE — Progress Notes (Signed)
Cardiology Office Note    Date:  09/10/2015   ID:  Mia Hernandez, DOB Oct 08, 1940, MRN [id number]  PCP:  Annye Asa, MD  Cardiologist:   Sanda Klein, MD   Chief Complaint  Patient presents with  . Follow-up    pt has surgery 10/05/15    History of Present Illness:  Mia Hernandez is a 75 y.o. 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 Echo and ECG subsequently normalized after his continuation of medicines.   She feels that she has improved substantially after partial lung resection (September 2016, [facility], right mid and lower lobe resectio)n. Her cough is almost gone. Overall she feels much healthier. She has been checking her blood pressure periodically at home and typically her blood pressures in the 128/70-150/90 mm Hg range. Today in the office when she checked in her blood pressure was high (160/92), but by the time I examined her her blood pressure was normal (130/79 mm Hg). He denies angina, dyspnea, dizziness, leg edema, claudication, focal neurological complaints, syncope. She does complain of pruritus especially over her upper back and upper forearms. Sometimes this is associated with a rash, but not always. She wonders whether it could be from her last remaining medication, valsartan. We performed multiple changes in her hypertension medications over the last few years, due to a variety of side effects. No rashes apparent today.    Past Medical History:  Diagnosis Date  . Acute left eye pain 11/15/2014  . ALLERGIC RHINITIS   . Bronchiectasis ([place])   . Chronic back pain   . Dysphagia 10/13/2014  . Hydropneumothorax 11/15/2014  . Hyponatremia 09/15/2014  . Mycobacterium avium-intracellulare infection ([place])   .  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 Osteoporosis   . Subcutaneous emphysema ([place]) 10/13/2014  . Toe pain 11/15/2014    Past Surgical History:  Procedure Laterality Date  . BREAST BIOPSY    . CHOLECYSTECTOMY    . LAMINECTOMY    . VAGINAL HYSTERECTOMY      Current Medications: Outpatient Medications Prior to Visit  Medication Sig Dispense Refill  . valsartan (DIOVAN) 320 MG tablet Take 1 tablet (320 mg total) by mouth daily. 90 tablet 3   No facility-administered medications prior to visit.      Allergies:   Ethambutol; Latex; Rifabutin; Aspirin; Azithromycin; Betadine [povidone iodine]; Codeine; Iodinated diagnostic agents; Macrolides and ketolides; Morphine and related; Penicillins; Prednisone; and Sulfa antibiotics   Social History   Social History  . Marital status: Married    Spouse name: N/A  . Number of children: N/A  . Years of education: N/A   Occupational History  . retired    Social History Main Topics  . Smoking status: Never Smoker  . Smokeless tobacco: Never Used  . Alcohol use 0.6 oz/week    1 Glasses of wine per week     Comment: at dinner  . Drug use: No  . Sexual activity: Not on file   Other Topics Concern  . Not on file   Social History Narrative  . No narrative on file     Family History:  The patient's family history includes Breast cancer in her mother; Pulmonary embolism in her brother; Stroke in her father.   ROS:   Please see  the history of present illness.    ROS All other systems reviewed and are negative.   PHYSICAL EXAM:   VS:  BP (!) 160/92 (BP Location: Left Arm, Patient Position: Sitting, Cuff Size: Normal)   Pulse 77   Ht 5' (1.524 m)   Wt 105 lb 12.8 oz (48 kg)   SpO2 98%   BMI 20.66 kg/m    GEN: Well nourished, well developed, in no acute distress  HEENT: normal  Neck: no JVD, carotid bruits, or masses Cardiac: RRR; no murmurs, rubs, or gallops,no  edema  Respiratory:  clear to auscultation bilaterally, normal work of breathing GI: soft, nontender, nondistended, + BS MS: no deformity or atrophy  Skin: warm and dry, no rash Neuro:  Alert and Oriented x 3, Strength and sensation are intact Psych: euthymic mood, full affect  Wt Readings from Last 3 Encounters:  09/09/15 105 lb 12.8 oz (48 kg)  08/04/15 107 lb 6.4 oz (48.7 kg)  05/25/15 104 lb 3.2 oz (47.3 kg)      Studies/Labs Reviewed:   EKG:  EKG is ordered today.  The ekg ordered today demonstrates Sinus rhythm, left atrial abnormality, incomplete right bundle branch block. Normal QTC  Recent Labs: 09/15/2014: ALT 16; BUN 8; Creat 0.55; Potassium 4.1; Sodium 131 02/16/2015: Hemoglobin 14.8; Platelets 295.0   Lipid Panel    Component Value Date/Time   CHOL 212 (H) 03/08/2014 0834   TRIG 58 03/08/2014 0834   HDL 120 03/08/2014 0834   CHOLHDL 1.8 03/08/2014 0834   VLDL 12 03/08/2014 0834   LDLCALC 80 03/08/2014 0834      ASSESSMENT:    1. Essential hypertension   2. Asymptomatic LV dysfunction      PLAN:  In order of problems listed above:  1. HTN: Blood pressure control is adequate. I'm reluctant to make any more changes in her antihypertensive regimen, due to a history of intolerance to numerous agents. I don't see a rash today and I think it is unlikely, although not impossible, that her pruritus is secondary to valsartan. Encouraged her to use moisturizers to avoid dry skin.    Medication Adjustments/Labs and Tests Ordered: Current medicines are reviewed at length with the patient today.  Concerns regarding medicines are outlined above.  Medication changes, Labs and Tests ordered today are listed in the Patient Instructions below. Patient Instructions  Dr Sallyanne Kuster recommends that you schedule a follow-up appointment in 12 months. You will receive a reminder letter in the mail two months in advance. If you don't receive a letter, please call our office to  schedule the follow-up appointment.  If you need a refill on your cardiac medications before your next appointment, please call your pharmacy.    Signed, Sanda Klein, MD  09/10/2015 11:16 AM    [facility] [address], [place], [place]  [address] Phone: [phone]; Fax: [phone]

## 2015-09-09 NOTE — Patient Instructions (Signed)
Dr Croitoru recommends that you schedule a follow-up appointment in 12 months. You will receive a reminder letter in the mail two months in advance. If you don't receive a letter, please call our office to schedule the follow-up appointment.  If you need a refill on your cardiac medications before your next appointment, please call your pharmacy. 

## 2015-09-10 ENCOUNTER — Encounter: Payer: Self-pay | Admitting: Cardiovascular Disease

## 2015-09-26 ENCOUNTER — Encounter (HOSPITAL_COMMUNITY): Payer: Self-pay

## 2015-09-26 ENCOUNTER — Other Ambulatory Visit (HOSPITAL_COMMUNITY): Payer: Self-pay | Admitting: *Deleted

## 2015-09-26 ENCOUNTER — Encounter (HOSPITAL_COMMUNITY)
Admission: RE | Admit: 2015-09-26 | Discharge: 2015-09-26 | Disposition: A | Discharge status: Home / Self Care | Payer: Medicare Other | Source: Ambulatory Visit | Attending: General Surgery | Admitting: General Surgery

## 2015-09-26 DIAGNOSIS — Z01812 Encounter for preprocedural laboratory examination: Secondary | ICD-10-CM | POA: Diagnosis not present

## 2015-09-26 DIAGNOSIS — I10 Essential (primary) hypertension: Secondary | ICD-10-CM | POA: Diagnosis not present

## 2015-09-26 HISTORY — DX: Essential (primary) hypertension: I10

## 2015-09-26 HISTORY — DX: Other complications of anesthesia, initial encounter: T88.59XA

## 2015-09-26 HISTORY — DX: Malignant (primary) neoplasm, unspecified: C80.1

## 2015-09-26 HISTORY — DX: Cerebral infarction, unspecified: I63.9

## 2015-09-26 HISTORY — DX: Unspecified osteoarthritis, unspecified site: M19.90

## 2015-09-26 HISTORY — DX: Adverse effect of unspecified general anesthetics, initial encounter: T41.205A

## 2015-09-26 HISTORY — DX: Adverse effect of unspecified anesthetic, initial encounter: T41.45XA

## 2015-09-26 LAB — CBC
HEMATOCRIT: 42.6 % (ref 36.0–46.0)
HEMOGLOBIN: 14.1 g/dL (ref 12.0–15.0)
MCH: 30.6 pg (ref 26.0–34.0)
MCHC: 33.1 g/dL (ref 30.0–36.0)
MCV: 92.4 fL (ref 78.0–100.0)
Platelets: 288 10*3/uL (ref 150–400)
RBC: 4.61 MIL/uL (ref 3.87–5.11)
RDW: 13.9 % (ref 11.5–15.5)
WBC: 6.4 10*3/uL (ref 4.0–10.5)

## 2015-09-26 LAB — BASIC METABOLIC PANEL
ANION GAP: 7 (ref 5–15)
BUN: 6 mg/dL (ref 6–20)
CALCIUM: 9.5 mg/dL (ref 8.9–10.3)
CO2: 29 mmol/L (ref 22–32)
Chloride: 97 mmol/L — ABNORMAL LOW (ref 101–111)
Creatinine, Ser: 0.52 mg/dL (ref 0.44–1.00)
GLUCOSE: 102 mg/dL — AB (ref 65–99)
POTASSIUM: 4.3 mmol/L (ref 3.5–5.1)
Sodium: 133 mmol/L — ABNORMAL LOW (ref 135–145)

## 2015-09-26 NOTE — Pre-Procedure Instructions (Signed)
Mia Hernandez  09/26/2015     Your procedure is scheduled on Wednesday, October 05, 2015 at 8:30 AM.   Report to [facility] Entrance "A" Admitting Office at 6:30 AM.   Call this number if you have problems the morning of surgery: [phone]   Questions prior to day of surgery, please call [phone] between 8 & 4 PM.   Remember:  Do not eat food or drink liquids after midnight Tuesday, 10/04/15.    Do not wear jewelry, make-up or nail polish.  Do not wear lotions, powders, or perfumes, or deodorant.  Do not shave 48 hours prior to surgery.    Do not bring valuables to the hospital.  [facility] is not responsible for any belongings or valuables.  Contacts, dentures or bridgework may not be worn into surgery.  Leave your suitcase in the car.  After surgery it may be brought to your room.  For patients admitted to the hospital, discharge time will be determined by your treatment team.  Patients discharged the day of surgery will not be allowed to drive home.   Special instructions:  [facility] - Preparing for Surgery  Before surgery, you can play an important role.  Because skin is not sterile, your skin needs to be as free of germs as possible.  You can reduce the number of germs on you skin by washing with CHG (chlorahexidine gluconate) soap before surgery.  CHG is an antiseptic cleaner which kills germs and bonds with the skin to continue killing germs even after washing.  Please DO NOT use if you have an allergy to CHG or antibacterial soaps.  If your skin becomes reddened/irritated stop using the CHG and inform your nurse when you arrive at Short Stay.  Do not shave (including legs and underarms) for at least 48 hours prior to the first CHG shower.  You may shave your face.  Please follow these instructions carefully:   1.  Shower with CHG Soap the night before surgery and the                    morning of Surgery.  2.  If you choose to wash your hair, wash  your hair first as usual with your       normal shampoo.  3.  After you shampoo, rinse your hair and body thoroughly to remove the shampoo.  4.  Use CHG as you would any other liquid soap.  You can apply chg directly       to the skin and wash gently with scrungie or a clean washcloth.  5.  Apply the CHG Soap to your body ONLY FROM THE NECK DOWN.        Do not use on open wounds or open sores.  Avoid contact with your eyes, ears, mouth and genitals (private parts).  Wash genitals (private parts) with your normal soap.  6.  Wash thoroughly, paying special attention to the area where your surgery        will be performed.  7.  Thoroughly rinse your body with warm water from the neck down.  8.  DO NOT shower/wash with your normal soap after using and rinsing off       the CHG Soap.  9.  Pat yourself dry with a clean towel.            10.  Wear clean pajamas.  11.  Place clean sheets on your bed the night of your first shower and do not        sleep with pets.  Day of Surgery  Do not apply any lotions/deodorants the morning of surgery.  Please wear clean clothes to the hospital.   Please read over the following fact sheets that you were given. Pain Booklet, Coughing and Deep Breathing and Surgical Site Infection Prevention

## 2015-09-27 ENCOUNTER — Encounter: Payer: Self-pay | Admitting: Internal Medicine

## 2015-09-27 ENCOUNTER — Ambulatory Visit (INDEPENDENT_AMBULATORY_CARE_PROVIDER_SITE_OTHER): Payer: Medicare Other | Admitting: Internal Medicine

## 2015-09-27 DIAGNOSIS — Z23 Encounter for immunization: Secondary | ICD-10-CM | POA: Diagnosis not present

## 2015-09-27 DIAGNOSIS — I10 Essential (primary) hypertension: Secondary | ICD-10-CM

## 2015-09-27 NOTE — Patient Instructions (Signed)
Flu vax  Good luck with your surgery !  Please call if you need [place]

## 2015-09-27 NOTE — Assessment & Plan Note (Signed)
BP discussed. She is recently seen cardiology and will turn to them for blood pressure guidance.

## 2015-09-27 NOTE — Progress Notes (Signed)
04/18/11- 75 yo F never smoker   F never smoker followed for cough,+MAIC/ triple therapy began 06/18/11- dc'd 2015 by Cardiology due to myocarditis ? Due to zith?. Cavitary lesion with history of hemoptysis, RML/RLL lobectomy 09/2014   04/18/2015-75 year old female never smoker followed for cough., +MAIC/ triple therapy began 06/18/11- dc'd 2015 by Cardiology due to myocarditis ? Due to zith?. Cavitary lesion with history of hemoptysis, RML/RLL lobectomy 09/2014 for bronchiectasis. FOLLOWS FOR: Pt states she can up and down stairs but when she trys to take a deep breath she is unable to do so.  Dr Tommy Medal noted hearings of fluid in her lungs recently. Last saw ID 3/27- had cultured mycobacterium avium and Nocardia Thoracentesis R  02/28/2015-sterile fluid, benign cytology. She says that thoracentesis procedure was painful and she hopes to avoid repeating it. We reviewed her x-ray images demonstrating residual fluid on the right.  05/25/15-75 year old female never smoker followed for cough +MAIC/ triple therapy began 06/18/11- dc'd 2015 by Cardiology due to myocarditis ? Due to zith?. Cavitary lesion with history of hemoptysis, RML/RLL lobectomy 09/2014 for bronchiectasis, right pleural effusion FOLLOWS FOR: Pt reports having a head cold and low grade fever over the past weekend. C/o PND. Pt does note some cough x 1 month.  Baseline cough is productive of scant white sputum. She is getting over the recent cold. We reviewed her CT report. She has a copy and focused especially on evidence of progression and bilateral involvement. She has been under the care of infectious disease here and at [facility] and realizes that within the limits of her medication intolerances, there is little additional to offer her. She does not currently recognize weight loss or night sweats CT chest 05/05/2015 IMPRESSION: 1. Small loculated apical right pleural effusion. Small loculated basilar right pleural effusion with associated mild  smooth posterior basilar right pleural thickening. No appreciable pleural nodules. 2. 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 3. One vessel coronary atherosclerosis. Electronically Signed  By: Ilona Sorrel M.D.  On: 05/05/2015 10:27  08/04/2015-75 year old female never smoker followed for cough +MAIC/ triple therapy began 06/18/11- dc'd 2015 by Cardiology due to myocarditis ? Due to zith?. Cavitary lesion with history of hemoptysis resected, RML/RLL lobectomy 09/2014 for bronchiectasis, right pleural effusion ACUTE VISIT: Pt has noticed her breathing starting to affect her-? fluid build up. Pt is due for breast mass to be removed in few months as well.  09/27/2015-75 year old female never smoker followed for cough +MAIC/ triple therapy began 06/18/11- dc'd 2015 by Cardiology due to myocarditis ? Due to zith?. Cavitary lesion with history of hemoptysis resected, RML/RLL lobectomy 09/2014 for bronchiectasis, right pleural effusion FOLLOWS FOR: reports breathing is unchanged since last.  due for lumpectomy on 9/27    Husband is here Cardiology is managing her blood pressure. Breathing okay with no acute issues. Tends to cough a little if outdoors doing gardening but mask does not help this. CXR 08/04/2015 IMPRESSION: COPD, postsurgical changes on the right. No significant change since the previous study. If the patient's symptoms warrant further evaluation, chest CT scanning would be a useful next imaging modality. Aortic atherosclerosis. Electronically Signed   By: David  Martinique M.D.   On: 08/04/2015 11:24  ROS- see HPI Constitutional:   No-   weight loss, night sweats, fevers, chills, fatigue, lassitude. HEENT:  No-  headaches, difficulty swallowing,  tooth/dental problems, sore throat,       No-  sneezing, itching, ear ache, nasal congestion, post nasal drip,  CV:  No-   chest pain, orthopnea, PND, swelling in lower extremities, anasarca, dizziness, palpitations Resp: +  shortness of breath with exertion or at rest.              +  productive cough,  little non-productive cough,  No recent coughing up of blood.              No-   change in color of mucus.  No- wheezing.   Skin: No-   rash or lesions. GI:  No-   heartburn, indigestion, abdominal pain, nausea, vomiting,  GU:  MS:  No-   joint pain or swelling.   Neuro-     nothing unusual Psych:  No- change in mood or affect. No depression or anxiety.  No memory loss.  OBJ- Physical Exam  General- Alert, Oriented, Affect-appropriate, Distress- none acute, +slender, talkative Skin- rash-none, lesions- none, excoriation- none Lymphadenopathy- none Head- atraumatic            Eyes- Gross vision intact, PERRLA, conjunctivae and secretions clear            Ears- Hearing aides            Nose- Clear, no-Septal dev, mucus, polyps, erosion, perforation             Throat- Mallampati II , mucosa clear , drainage- none, tonsils- atrophic Neck- flexible , trachea midline, no stridor , thyroid nl, carotid no bruit Chest - symmetrical excursion , unlabored           Heart/CV- RRR , no murmur , no gallop  , no rub, nl s1 s2                           - JVD- none , edema- none, stasis changes- none, varices- none           Lung- + trace squeak left upper back, without wheeze or rhonchi, cough- none , dullness+ right base, rub- none           Chest wall-  Abd-  Br/ Gen/ Rectal- Not done, not indicated Extrem- cyanosis- none, clubbing, none, atrophy- none, strength- nl Neuro- grossly intact to observation

## 2015-09-27 NOTE — Assessment & Plan Note (Signed)
She understands Infectious Disease to save there is nothing more to do for her at this time. Symptoms are so modest that she is really functioning pretty well with no acute change expected.

## 2015-09-27 NOTE — Assessment & Plan Note (Signed)
Subclinical, very slow chronic atypical infection-MAIC. She does not need bronchodilators at this time.

## 2015-09-27 NOTE — Assessment & Plan Note (Signed)
Stable without recent pulmonary exacerbation, for planned breast lumpectomy. From a pulmonary standpoint she is expected to tolerate general anesthesia without difficulty if needed. Plan-flu shot

## 2015-10-03 ENCOUNTER — Ambulatory Visit
Admission: RE | Admit: 2015-10-03 | Discharge: 2015-10-03 | Disposition: A | Discharge status: Home / Self Care | Payer: Medicare Other | Source: Ambulatory Visit | Attending: General Surgery | Admitting: General Surgery

## 2015-10-03 DIAGNOSIS — D242 Benign neoplasm of left breast: Secondary | ICD-10-CM | POA: Diagnosis not present

## 2015-10-05 ENCOUNTER — Ambulatory Visit (HOSPITAL_COMMUNITY)
Admission: RE | Admit: 2015-10-05 | Discharge: 2015-10-05 | Disposition: A | Discharge status: Home / Self Care | Payer: Medicare Other | Source: Ambulatory Visit | Attending: General Surgery | Admitting: General Surgery

## 2015-10-05 ENCOUNTER — Encounter (HOSPITAL_COMMUNITY)
Admission: RE | Disposition: A | Discharge status: Home / Self Care | Payer: Self-pay | Source: Ambulatory Visit | Attending: General Surgery

## 2015-10-05 ENCOUNTER — Ambulatory Visit
Admission: RE | Admit: 2015-10-05 | Discharge: 2015-10-05 | Disposition: A | Discharge status: Home / Self Care | Payer: Medicare Other | Source: Ambulatory Visit | Attending: General Surgery | Admitting: General Surgery

## 2015-10-05 ENCOUNTER — Encounter (HOSPITAL_COMMUNITY): Payer: Self-pay | Admitting: Certified Registered Nurse Anesthetist

## 2015-10-05 ENCOUNTER — Ambulatory Visit (HOSPITAL_COMMUNITY): Payer: Medicare Other | Admitting: Certified Registered Nurse Anesthetist

## 2015-10-05 DIAGNOSIS — J449 Chronic obstructive pulmonary disease, unspecified: Secondary | ICD-10-CM | POA: Diagnosis not present

## 2015-10-05 DIAGNOSIS — Z823 Family history of stroke: Secondary | ICD-10-CM | POA: Diagnosis not present

## 2015-10-05 DIAGNOSIS — M1990 Unspecified osteoarthritis, unspecified site: Secondary | ICD-10-CM | POA: Insufficient documentation

## 2015-10-05 DIAGNOSIS — N6012 Diffuse cystic mastopathy of left breast: Secondary | ICD-10-CM | POA: Diagnosis not present

## 2015-10-05 DIAGNOSIS — M549 Dorsalgia, unspecified: Secondary | ICD-10-CM | POA: Diagnosis not present

## 2015-10-05 DIAGNOSIS — D242 Benign neoplasm of left breast: Secondary | ICD-10-CM | POA: Insufficient documentation

## 2015-10-05 DIAGNOSIS — I10 Essential (primary) hypertension: Secondary | ICD-10-CM | POA: Diagnosis not present

## 2015-10-05 DIAGNOSIS — Z8249 Family history of ischemic heart disease and other diseases of the circulatory system: Secondary | ICD-10-CM | POA: Insufficient documentation

## 2015-10-05 DIAGNOSIS — Z8673 Personal history of transient ischemic attack (TIA), and cerebral infarction without residual deficits: Secondary | ICD-10-CM | POA: Insufficient documentation

## 2015-10-05 HISTORY — PX: BREAST LUMPECTOMY WITH RADIOACTIVE SEED LOCALIZATION: SHX6424

## 2015-10-05 SURGERY — BREAST LUMPECTOMY WITH RADIOACTIVE SEED LOCALIZATION
Anesthesia: General | Site: Breast | Laterality: Left

## 2015-10-05 MED ORDER — PHENYLEPHRINE HCL 10 MG/ML IJ SOLN
INTRAVENOUS | Status: DC | PRN
  Administered 2015-10-05: 25 ug/min via INTRAVENOUS

## 2015-10-05 MED ORDER — CIPROFLOXACIN IN D5W 400 MG/200ML IV SOLN
INTRAVENOUS | Status: DC | PRN
  Administered 2015-10-05: 400 mg via INTRAVENOUS

## 2015-10-05 MED ORDER — ACETAMINOPHEN 500 MG PO TABS
ORAL_TABLET | ORAL | Status: AC
  Administered 2015-10-05: 1000 mg
  Filled 2015-10-05: qty 2

## 2015-10-05 MED ORDER — LACTATED RINGERS IV SOLN
INTRAVENOUS | Status: DC | PRN
  Administered 2015-10-05: 08:00:00 via INTRAVENOUS

## 2015-10-05 MED ORDER — BUPIVACAINE-EPINEPHRINE 0.25% -1:200000 IJ SOLN
INTRAMUSCULAR | Status: DC | PRN
  Administered 2015-10-05: 20 mL

## 2015-10-05 MED ORDER — MIDAZOLAM HCL 2 MG/2ML IJ SOLN
INTRAMUSCULAR | Status: AC
  Filled 2015-10-05: qty 2

## 2015-10-05 MED ORDER — FENTANYL CITRATE (PF) 100 MCG/2ML IJ SOLN
INTRAMUSCULAR | Status: AC
  Filled 2015-10-05: qty 2

## 2015-10-05 MED ORDER — PROPOFOL 10 MG/ML IV BOLUS
INTRAVENOUS | Status: AC
  Filled 2015-10-05: qty 20

## 2015-10-05 MED ORDER — 0.9 % SODIUM CHLORIDE (POUR BTL) OPTIME
TOPICAL | Status: DC | PRN
  Administered 2015-10-05: 1000 mL

## 2015-10-05 MED ORDER — LIDOCAINE HCL (CARDIAC) 20 MG/ML IV SOLN
INTRAVENOUS | Status: DC | PRN
  Administered 2015-10-05: 60 mg via INTRAVENOUS

## 2015-10-05 MED ORDER — FENTANYL CITRATE (PF) 100 MCG/2ML IJ SOLN
INTRAMUSCULAR | Status: DC | PRN
  Administered 2015-10-05 (×2): 50 ug via INTRAVENOUS

## 2015-10-05 MED ORDER — PROPOFOL 500 MG/50ML IV EMUL
INTRAVENOUS | Status: DC | PRN
  Administered 2015-10-05: 100 ug/kg/min via INTRAVENOUS

## 2015-10-05 MED ORDER — BUPIVACAINE-EPINEPHRINE (PF) 0.25% -1:200000 IJ SOLN
INTRAMUSCULAR | Status: AC
  Filled 2015-10-05: qty 30

## 2015-10-05 MED ORDER — DEXAMETHASONE SODIUM PHOSPHATE 10 MG/ML IJ SOLN
INTRAMUSCULAR | Status: DC | PRN
  Administered 2015-10-05: 8 mg via INTRAVENOUS

## 2015-10-05 MED ORDER — FENTANYL CITRATE (PF) 100 MCG/2ML IJ SOLN: 25.0000 ug | INTRAMUSCULAR | Status: DC | PRN

## 2015-10-05 MED ORDER — ONDANSETRON HCL 4 MG/2ML IJ SOLN
INTRAMUSCULAR | Status: DC | PRN
  Administered 2015-10-05: 4 mg via INTRAVENOUS

## 2015-10-05 MED ORDER — CIPROFLOXACIN IN D5W 400 MG/200ML IV SOLN
INTRAVENOUS | Status: AC
  Filled 2015-10-05: qty 200

## 2015-10-05 SURGICAL SUPPLY — 47 items
APPLIER CLIP 9.375 MED OPEN (MISCELLANEOUS)
BINDER BREAST LRG (GAUZE/BANDAGES/DRESSINGS) IMPLANT
BINDER BREAST XLRG (GAUZE/BANDAGES/DRESSINGS) IMPLANT
BLADE SURG 15 STRL LF DISP TIS (BLADE) ×1 IMPLANT
BLADE SURG 15 STRL SS (BLADE) ×2
CANISTER SUCTION 2500CC (MISCELLANEOUS) IMPLANT
CHLORAPREP W/TINT 26ML (MISCELLANEOUS) ×3 IMPLANT
CLIP APPLIE 9.375 MED OPEN (MISCELLANEOUS) IMPLANT
COVER PROBE W GEL 5X96 (DRAPES) ×3 IMPLANT
COVER SURGICAL LIGHT HANDLE (MISCELLANEOUS) ×3 IMPLANT
DERMABOND ADVANCED (GAUZE/BANDAGES/DRESSINGS) ×2
DERMABOND ADVANCED .7 DNX12 (GAUZE/BANDAGES/DRESSINGS) ×1 IMPLANT
DEVICE DUBIN SPECIMEN MAMMOGRA (MISCELLANEOUS) ×3 IMPLANT
DRAPE CHEST BREAST 15X10 FENES (DRAPES) ×3 IMPLANT
DRAPE SURG 17X23 STRL (DRAPES) ×3 IMPLANT
DRAPE UTILITY XL STRL (DRAPES) ×3 IMPLANT
ELECT CAUTERY BLADE 6.4 (BLADE) ×3 IMPLANT
ELECT COATED BLADE 2.86 ST (ELECTRODE) ×3 IMPLANT
ELECT REM PT RETURN 9FT ADLT (ELECTROSURGICAL) ×3
ELECTRODE REM PT RTRN 9FT ADLT (ELECTROSURGICAL) ×1 IMPLANT
GLOVE BIOGEL PI IND STRL 7.0 (GLOVE) ×2 IMPLANT
GLOVE BIOGEL PI IND STRL 8 (GLOVE) ×1 IMPLANT
GLOVE BIOGEL PI INDICATOR 7.0 (GLOVE) ×4
GLOVE BIOGEL PI INDICATOR 8 (GLOVE) ×2
GLOVE ECLIPSE 7.5 STRL STRAW (GLOVE) ×3 IMPLANT
GLOVE SURG SS PI 7.0 STRL IVOR (GLOVE) ×3 IMPLANT
GLOVE SURG SS PI 7.5 STRL IVOR (GLOVE) ×3 IMPLANT
GOWN STRL REUS W/ TWL LRG LVL3 (GOWN DISPOSABLE) ×1 IMPLANT
GOWN STRL REUS W/ TWL XL LVL3 (GOWN DISPOSABLE) ×1 IMPLANT
GOWN STRL REUS W/TWL LRG LVL3 (GOWN DISPOSABLE) ×2
GOWN STRL REUS W/TWL XL LVL3 (GOWN DISPOSABLE) ×2
KIT BASIN OR (CUSTOM PROCEDURE TRAY) ×3 IMPLANT
KIT MARKER MARGIN INK (KITS) ×3 IMPLANT
NEEDLE HYPO 25X1 1.5 SAFETY (NEEDLE) ×3 IMPLANT
NS IRRIG 1000ML POUR BTL (IV SOLUTION) IMPLANT
PACK SURGICAL SETUP 50X90 (CUSTOM PROCEDURE TRAY) ×3 IMPLANT
PENCIL BUTTON HOLSTER BLD 10FT (ELECTRODE) ×3 IMPLANT
SPONGE LAP 18X18 X RAY DECT (DISPOSABLE) ×3 IMPLANT
SUT MON AB 5-0 PS2 18 (SUTURE) ×3 IMPLANT
SUT VIC AB 3-0 SH 18 (SUTURE) ×3 IMPLANT
SYR BULB 3OZ (MISCELLANEOUS) ×3 IMPLANT
SYR CONTROL 10ML LL (SYRINGE) ×3 IMPLANT
TOWEL OR 17X24 6PK STRL BLUE (TOWEL DISPOSABLE) ×3 IMPLANT
TOWEL OR 17X26 10 PK STRL BLUE (TOWEL DISPOSABLE) ×3 IMPLANT
TUBE CONNECTING 12'X1/4 (SUCTIONS)
TUBE CONNECTING 12X1/4 (SUCTIONS) IMPLANT
YANKAUER SUCT BULB TIP NO VENT (SUCTIONS) IMPLANT

## 2015-10-05 NOTE — Transfer of Care (Signed)
Immediate Anesthesia Transfer of Care Note  Patient: Mia Hernandez  Procedure(s) Performed: Procedure(s): LEFT BREAST LUMPECTOMY WITH RADIOACTIVE SEED LOCALIZATION (Left)  Patient Location: PACU  Anesthesia Type:General  Level of Consciousness: awake, alert , oriented and patient cooperative  Airway & Oxygen Therapy: Patient Spontanous Breathing  Post-op Assessment: Report given to RN and Post -op Vital signs reviewed and stable  Post vital signs: Reviewed and stable  Last Vitals:  Vitals:   10/05/15 0730  BP: (!) 198/98  Pulse: 87  Resp: 20  Temp: 37.1 C    Last Pain: There were no vitals filed for this visit.       Complications: No apparent anesthesia complications

## 2015-10-05 NOTE — Progress Notes (Signed)
Report given to robin roberts rn as caregiver 

## 2015-10-05 NOTE — Anesthesia Postprocedure Evaluation (Signed)
Anesthesia Post Note  Patient: Mia Hernandez  Procedure(s) Performed: Procedure(s) (LRB): LEFT BREAST LUMPECTOMY WITH RADIOACTIVE SEED LOCALIZATION (Left)  Patient location during evaluation: PACU Anesthesia Type: General Level of consciousness: awake and alert, oriented and patient cooperative Pain management: pain level controlled Vital Signs Assessment: post-procedure vital signs reviewed and stable Respiratory status: spontaneous breathing, nonlabored ventilation and respiratory function stable Cardiovascular status: blood pressure returned to baseline and stable Postop Assessment: no signs of nausea or vomiting Anesthetic complications: no    Last Vitals:  Vitals:   10/05/15 1015 10/05/15 1023  BP:  (!) 151/82  Pulse: (!) 58 (!) 59  Resp: 19 15  Temp: 36.2 C     Last Pain:  Vitals:   10/05/15 1023  PainSc: 0-No pain                 Vaniah Chambers,E. [place]

## 2015-10-05 NOTE — Anesthesia Preprocedure Evaluation (Addendum)
Anesthesia Evaluation  Patient identified by MRN, date of birth, ID band Patient awake    Reviewed: Allergy & Precautions, NPO status , Patient's Chart, lab work & pertinent test results  History of Anesthesia Complications Negative for: history of anesthetic complications  Airway Mallampati: III  TM Distance: <3 FB Neck ROM: Full    Dental  (+) Teeth Intact   Pulmonary neg pulmonary ROS,    breath sounds clear to auscultation       Cardiovascular hypertension, Pt. on medications (-) angina(-) Past MI and (-) CHF  Rhythm:Regular Rate:Normal     Neuro/Psych TIAnegative psych ROS   GI/Hepatic negative GI ROS, Neg liver ROS,   Endo/Other  negative endocrine ROS  Renal/GU negative Renal ROS     Musculoskeletal  (+) Arthritis ,   Abdominal   Peds  Hematology negative hematology ROS (+)   Anesthesia Other Findings   Reproductive/Obstetrics                            Anesthesia Physical Anesthesia Plan  ASA: III  Anesthesia Plan: General   Post-op Pain Management:    Induction: Intravenous  Airway Management Planned: LMA  Additional Equipment: None  Intra-op Plan:   Post-operative Plan: Extubation in OR  Informed Consent: I have reviewed the patients History and Physical, chart, labs and discussed the procedure including the risks, benefits and alternatives for the proposed anesthesia with the patient or authorized representative who has indicated his/her understanding and acceptance.   Dental advisory given  Plan Discussed with: CRNA and Surgeon  Anesthesia Plan Comments:         Anesthesia Quick Evaluation

## 2015-10-05 NOTE — Anesthesia Procedure Notes (Signed)
Procedure Name: LMA Insertion Date/Time: 10/05/2015 8:56 AM Performed by: Oletta Lamas Pre-anesthesia Checklist: Patient identified, Emergency Drugs available, Suction available and Patient being monitored Patient Re-evaluated:Patient Re-evaluated prior to inductionOxygen Delivery Method: Circle system utilized Preoxygenation: Pre-oxygenation with 100% oxygen Intubation Type: IV induction LMA: LMA inserted LMA Size: 3.0 Number of attempts: 2 Placement Confirmation: positive ETCO2 and breath sounds checked- equal and bilateral Tube secured with: Tape Dental Injury: Teeth and Oropharynx as per pre-operative assessment  Difficulty Due To: Difficulty was unanticipated

## 2015-10-05 NOTE — H&P (Signed)
History of Present Illness  The patient is a 75 year old female who presents with a complaint of Breast problems. She is referred by Dr. Juanita Craver for a recent abnormal mammogram and core biopsy showing papilloma. The patient has a significant history of fibrocystic breast disease having had several stereotactic biopsies and 1 open left breast biopsy over the years. All of these it been benign. She recently presented for screening mammogram. Several small masses were seen in the left breast. Subsequent tomosynthesis and ultrasound showed several simple cysts in the left breast but at the 5 o'clock position 3 cm from the nipple there was a slightly irregular 7 mm solid mass. Large core needle biopsy was recommended and performed on June 16, 2015. This has revealed an intraductal papilloma. The patient is not experiencing any new breast symptoms. She generally has some lumpiness of the left breast was his unchanged. No nipple discharge or skin changes.   Other Problems  Arthritis Cerebrovascular Accident Chronic Obstructive Lung Disease General anesthesia - complications Hemorrhoids High blood pressure Lump In Breast Oophorectomy  Past Surgical History  Appendectomy Breast Biopsy Bilateral. multiple Cataract Surgery Bilateral. Gallbladder Surgery - Open Hysterectomy (not due to cancer) - Complete Lung Surgery Right. Oral Surgery Spinal Surgery - Neck Tonsillectomy  Diagnostic Studies History  Colonoscopy 5-10 years ago Mammogram within last year Pap Smear 1-5 years ago  Allergies  No Known Drug Allergies  Medication History  Valsartan (160MG  Tablet, Oral) Active. Medications Reconciled  Social History  Alcohol use Moderate alcohol use. Caffeine use Coffee. No drug use Tobacco use Never smoker.  Family History  Breast Cancer Mother. Cerebrovascular Accident Father. Diabetes Mellitus Brother. Hypertension Brother,  Mother. Respiratory Condition Mother.  Pregnancy / Birth History  Age at menarche 24 years. Contraceptive History Oral contraceptives. Gravida 1 Maternal age 71-40 Para 1    Review of Systems  General Not Present- Appetite Loss, Chills, Fatigue, Fever, Night Sweats, Weight Gain and Weight Loss. Skin Present- Dryness. Not Present- Change in Wart/Mole, Hives, Jaundice, New Lesions, Non-Healing Wounds, Rash and Ulcer. HEENT Present- Hearing Loss and Wears glasses/contact lenses. Not Present- Earache, Hoarseness, Nose Bleed, Oral Ulcers, Ringing in the Ears, Seasonal Allergies, Sinus Pain, Sore Throat, Visual Disturbances and Yellow Eyes. Breast Present- Breast Mass. Not Present- Breast Pain, Nipple Discharge and Skin Changes. Cardiovascular Not Present- Chest Pain, Difficulty Breathing Lying Down, Leg Cramps, Palpitations, Rapid Heart Rate, Shortness of Breath and Swelling of Extremities. Gastrointestinal Not Present- Abdominal Pain, Bloating, Bloody Stool, Change in Bowel Habits, Chronic diarrhea, Constipation, Difficulty Swallowing, Excessive gas, Gets full quickly at meals, Hemorrhoids, Indigestion, Nausea, Rectal Pain and Vomiting.   BP (!) 198/98   Pulse 87   Temp 98.8 F (37.1 C)   Resp 20   Ht 5' (1.524 m)   Wt 48.1 kg (106 lb)   SpO2 100%   BMI 20.70 kg/m     Physical Exam The physical exam findings are as follows: Note:General: Thin elderly Caucasian female, in no distress Skin: Warm and dry without rash or infection. HEENT: No palpable masses or thyromegaly. Sclera nonicteric. Pupils equal round and reactive. Lymph nodes: No cervical, supraclavicular, nodes palpable. Breasts: Slight irregularity outer left breast. Old incision. No dominant masses. No skin changes or nipple discharge. Lungs: Decreased breath sounds right lower chest. No wheezing or increased work of breathing. Cardiovascular: Regular rate and rhythm without murmer. No JVD or edema. Abdomen:  Healed upper midline incision. Nontender. Extremities: No edema or joint swelling or deformity. No chronic  venous stasis changes. Neurologic: Alert and fully oriented. Gait normal. No focal weakness. Psychiatric: Normal mood and affect. Thought content appropriate with normal judgement and insight    Assessment & Plan INTRADUCTAL PAPILLOMA OF BREAST, LEFT (D24.2) Impression: Abnormal mammogram and ultrasound and core biopsy showing intraductal papilloma. I discussed the diagnosis in detail with the patient and her husband. We discussed that there is a small risk, probably less than 10% of an undiagnosed adjacent early stage breast cancer. I discussed options with the patient including excisional biopsy versus close follow-up with imaging. With her pulmonary problems and age I think close follow-up would be reasonable. However she is doing very well from a pulmonary standpoint and she feels that she would have difficulty handling this from a emotional standpoint and would prefer aggressive evaluation to rule out malignancy. Therefore after discussion we elected to proceed with radioactive seed localized left breast lumpectomy. I discussed the nature of surgery and recovery and risks of bleeding and infection and anesthetic problems. Current Plans Pt Education - [facility] Breast Biopsy HCI: discussed with patient and provided information. Radioactive seed localized left breast lumpectomy under general anesthesia as an outpatient

## 2015-10-05 NOTE — Discharge Instructions (Signed)
[place] Office Phone Number [phone]  BREAST BIOPSY/ PARTIAL MASTECTOMY: POST OP INSTRUCTIONS  Always review your discharge instruction sheet given to you by the facility where your surgery was performed.  IF YOU HAVE DISABILITY OR FAMILY LEAVE FORMS, YOU MUST BRING THEM TO THE OFFICE FOR PROCESSING.  DO NOT GIVE THEM TO YOUR DOCTOR.  1. A prescription for pain medication may be given to you upon discharge.  Take your pain medication as prescribed, if needed.  If narcotic pain medicine is not needed, then you may take acetaminophen (Tylenol) or ibuprofen (Advil) as needed. 2. Take your usually prescribed medications unless otherwise directed 3. If you need a refill on your pain medication, please contact your pharmacy.  They will contact our office to request authorization.  Prescriptions will not be filled after 5pm or on week-ends. 4. You should eat very light the first 24 hours after surgery, such as soup, crackers, pudding, etc.  Resume your normal diet the day after surgery. 5. Most patients will experience some swelling and bruising in the breast.  Ice packs and a good support bra will help.  Swelling and bruising can take several days to resolve.  6. It is common to experience some constipation if taking pain medication after surgery.  Increasing fluid intake and taking a stool softener will usually help or prevent this problem from occurring.  A mild laxative (Milk of Magnesia or Miralax) should be taken according to package directions if there are no bowel movements after 48 hours. 7. Unless discharge instructions indicate otherwise, you may remove your bandages 24-48 hours after surgery, and you may shower at that time.  You may have steri-strips (small skin tapes) in place directly over the incision.  These strips should be left on the skin for 7-10 days.  If your surgeon used skin glue on the incision, you may shower in 24 hours.  The glue will flake off over the  next 2-3 weeks.  Any sutures or staples will be removed at the office during your follow-up visit. 8. ACTIVITIES:  You may resume regular daily activities (gradually increasing) beginning the next day.  Wearing a good support bra or sports bra minimizes pain and swelling.  You may have sexual intercourse when it is comfortable. a. You may drive when you no longer are taking prescription pain medication, you can comfortably wear a seatbelt, and you can safely maneuver your car and apply brakes. b. RETURN TO WORK:  ______________________________________________________________________________________ 9. You should see your doctor in the office for a follow-up appointment approximately two weeks after your surgery.  Your doctors nurse will typically make your follow-up appointment when she calls you with your pathology report.  Expect your pathology report 2-3 business days after your surgery.  You may call to check if you do not hear from [place] after three days. 10. OTHER INSTRUCTIONS: __________________ Tylenol or Advil as needed for pain _____________________________________________________________________________ _____________________________________________________________________________________________________________________________________ _____________________________________________________________________________________________________________________________________ _____________________________________________________________________________________________________________________________________  WHEN TO CALL YOUR DOCTOR: 1. Fever over 101.0 2. Nausea and/or vomiting. 3. Extreme swelling or bruising. 4. Continued bleeding from incision. 5. Increased pain, redness, or drainage from the incision.  The clinic staff is available to answer your questions during regular business hours.  Please dont hesitate to call and ask to speak to one of the nurses for clinical concerns.  If you have a  medical emergency, go to the nearest emergency room or call 911.  A surgeon from [facility] is always on call at the hospital.  For further questions, please visit [email or web address]

## 2015-10-05 NOTE — Op Note (Signed)
Preoperative Diagnosis: LEFT BREAST PAPILLOMA  Postoprative Diagnosis: LEFT BREAST PAPILLOMA  Procedure: Procedure(s): LEFT BREAST LUMPECTOMY WITH RADIOACTIVE SEED LOCALIZATION   Surgeon: Excell Seltzer T   Assistants: None  Anesthesia:  General LMA anesthesia  Indications: Patient is a 75 year old female with a history of several previous benign breast biopsies with a recent abnormal mammogram of the left breast showing a small mass of large core needle biopsy revealing papilloma. I discussed options with the patient including excision versus observation and she has elected to proceed with excision to rule out the chance of underlying malignancy. We have previously discussed discussed the procedure and recovery and potential risks.  Procedure Detail:  Patient was brought to the operating room, placed in supine position on the operating table and laryngeal mask anesthesia induced. The seed placement had been confirmed with the neoprobe in the holding area. The left breast was widely sterilely prepped and draped. She received preoperative IV antibiotics. Patient timeout was performed and correct procedure verified. Local anesthesia was used to infiltrate the skin and underlying soft tissue. The seed was localized near the inframammary crease of the left. Counts indicated it was very close to the skin. An incision was made in a skin crease near the inframammary fold. Immediately upon making the incision the seed was visible in the subcutaneous tissue. The seed was removed and secured and a cup. I then excised an approximately 1-1/2 cm globular specimen of breast tissue just beneath this which because of the thin breast tissue in this area extended down close to the chest wall. Specimen x-ray was obtained after inking margins and replacing the seed at its previous location and this showed of course the seed as well as the marking clip centrally located. This was sent for permanent pathology.  Hemostasis was assured. The deep breast dissecting this tissue was closed with interrupted Vicryl and the skin with subcuticular Monocryl and Dermabond. Sponge needle and instrument counts were correct.    Findings: As above  Estimated Blood Loss:  Minimal         Drains: None  Blood Given: none          Specimens: Left breast lumpectomy        Complications:  * No complications entered in OR log *         Disposition: PACU - hemodynamically stable.         Condition: stable

## 2015-10-05 NOTE — Interval H&P Note (Signed)
History and Physical Interval Note:  10/05/2015 8:35 AM  Mia Hernandez  has presented today for surgery, with the diagnosis of LEFT BREAST PAPILLOMA  The various methods of treatment have been discussed with the patient and family. After consideration of risks, benefits and other options for treatment, the patient has consented to  Procedure(s): LEFT BREAST LUMPECTOMY WITH RADIOACTIVE SEED LOCALIZATION (Left) as a surgical intervention .  The patient's history has been reviewed, patient examined, no change in status, stable for surgery.  I have reviewed the patient's chart and labs.  Questions were answered to the patient's satisfaction.     Donie Moulton T

## 2015-10-06 ENCOUNTER — Encounter (HOSPITAL_COMMUNITY): Payer: Self-pay | Admitting: General Surgery

## 2015-10-13 ENCOUNTER — Encounter: Payer: Self-pay | Admitting: Cardiovascular Disease

## 2015-10-14 ENCOUNTER — Other Ambulatory Visit: Payer: Self-pay | Admitting: *Deleted

## 2015-10-14 MED ORDER — VALSARTAN 160 MG PO TABS: 160.0000 mg | ORAL_TABLET | Freq: Every day | ORAL | 3 refills | Status: DC

## 2015-10-26 ENCOUNTER — Ambulatory Visit (INDEPENDENT_AMBULATORY_CARE_PROVIDER_SITE_OTHER): Payer: Medicare Other | Admitting: Family Medicine

## 2015-10-26 ENCOUNTER — Encounter: Payer: Self-pay | Admitting: General Practice

## 2015-10-26 ENCOUNTER — Encounter: Payer: Self-pay | Admitting: Family Medicine

## 2015-10-26 VITALS — BP 130/78 | HR 71 | Temp 98.1°F | Resp 16 | Ht 60.0 in | Wt 104.1 lb

## 2015-10-26 DIAGNOSIS — A310 Pulmonary mycobacterial infection: Secondary | ICD-10-CM

## 2015-10-26 DIAGNOSIS — I10 Essential (primary) hypertension: Secondary | ICD-10-CM

## 2015-10-26 DIAGNOSIS — I514 Myocarditis, unspecified: Secondary | ICD-10-CM | POA: Diagnosis not present

## 2015-10-26 NOTE — Progress Notes (Signed)
   Subjective:    Patient ID: Mia Hernandez, female    DOB: 09-01-1940, 75 y.o.   MRN: [id number]  HPI New to establish.  Previous MD- Amado Nash- [place]  Cards- Dr Avon Gully  HTN- chronic problem, on Valsartan w/ adequate control.  Denies CP, SOB, HAs, visual changes, edema.  Pt is very active.  [facility]- chronic problem, seeing Dr Annamaria Boots.  Has also seen Dr Drucilla Schmidt.  Pt had resection of 2 R lung lobes at [facility].  Pt denies current cough, SOB.  No recent fevers.   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  Pt reports sxs improved once Azithro was d/c'd.     Review of Systems For ROS see HPI     Objective:   Physical Exam  Constitutional: She is oriented to person, place, and time. She appears well-developed and well-nourished. No distress.  HENT:  Head: Normocephalic and atraumatic.  Eyes: Conjunctivae and EOM are normal. Pupils are equal, round, and reactive to light.  Neck: Normal range of motion. Neck supple. No thyromegaly present.  Cardiovascular: Normal rate, regular rhythm, normal heart sounds and intact distal pulses.   No murmur heard. Pulmonary/Chest: Effort normal. No respiratory distress. She has no wheezes. She exhibits no tenderness.  No crackles but scattered squeaking, L>R  Abdominal: Soft. She exhibits no distension. There is no tenderness.  Musculoskeletal: She exhibits no edema.  Lymphadenopathy:    She has no cervical adenopathy.  Neurological: She is alert and oriented to person, place, and time.  Skin: Skin is warm and dry.  Psychiatric: She has a normal mood and affect. Her behavior is normal.  Vitals reviewed.         Assessment & Plan:

## 2015-10-26 NOTE — Assessment & Plan Note (Signed)
New to provider, ongoing for pt.  Well controlled today.  Asymptomatic.  Reviewed recent labs done by Dr Annamaria Boots.  No med changes at this time

## 2015-10-26 NOTE — Assessment & Plan Note (Signed)
New to provider, ongoing for pt.  She had R middle and lower lobes resected at [facility].  Was on triple therapy until she had multiple complications including Myocarditis.  Pt continues to follow w/ Dr Annamaria Boots.  Asymptomatic at this time.

## 2015-10-26 NOTE — Assessment & Plan Note (Signed)
New to provider.  Pt has hx of this due to prolonged use of Azithromycin in the setting of [facility] tx.  Pt reports she is asymptomatic since stopping the abx.  Following w/ Dr C.  Will follow along and assist as able.

## 2015-10-26 NOTE — Progress Notes (Signed)
Pre visit review using our clinic review tool, if applicable. No additional management support is needed unless otherwise documented below in the visit note. 

## 2015-10-26 NOTE — Patient Instructions (Signed)
Schedule your complete physical in 6 months No medication changes at this time Keep up the good work!  You look great! Call with any questions or concerns Welcome!  We're glad to have you!!! HAPPY BIRTHDAY!!!

## 2015-11-08 LAB — FUNGUS CULTURE W SMEAR

## 2015-11-29 ENCOUNTER — Encounter: Payer: Self-pay | Admitting: General Practice

## 2015-12-28 ENCOUNTER — Ambulatory Visit (INDEPENDENT_AMBULATORY_CARE_PROVIDER_SITE_OTHER): Payer: Medicare Other | Admitting: Family Medicine

## 2015-12-28 ENCOUNTER — Encounter: Payer: Self-pay | Admitting: Family Medicine

## 2015-12-28 VITALS — BP 121/72 | HR 90 | Temp 98.0°F | Resp 17 | Ht 59.0 in | Wt 104.0 lb

## 2015-12-28 DIAGNOSIS — M62838 Other muscle spasm: Secondary | ICD-10-CM | POA: Diagnosis not present

## 2015-12-28 MED ORDER — TIZANIDINE HCL 2 MG PO TABS: 2.0000 mg | ORAL_TABLET | Freq: Three times a day (TID) | ORAL | 0 refills | Status: DC | PRN

## 2015-12-28 NOTE — Patient Instructions (Signed)
This appears to be a muscle spasm Continue to use heat to improve your muscle pain 1-2 Tylenol up to 3x/day for pain relief Use the muscle relaxer as needed- may cause drowsiness so no driving! Call with any questions or concerns- particularly if not improving Happy Holidays!!!

## 2015-12-28 NOTE — Progress Notes (Signed)
Pre visit review using our clinic review tool, if applicable. No additional management support is needed unless otherwise documented below in the visit note. 

## 2015-12-28 NOTE — Progress Notes (Signed)
   Subjective:    Patient ID: Mia Hernandez, female    DOB: [birth year], 75 y.o.   MRN: [id number]  HPI Neck/shoulder pain- R sided, sxs started ~4 days ago.  Pt took 2 ES Tylenol w/ some relief.  S/p laminectomy w/ Dr Trenton Gammon.  Has also been told she has arthritis.  Pt has been doing a lot of overhead motion.  No change in sleeping arrangements.  Pain starts 'just beside the spine and travels outward'.     Review of Systems For ROS see HPI     Objective:   Physical Exam  Constitutional: She is oriented to person, place, and time. She appears well-developed and well-nourished. No distress.  HENT:  Head: Normocephalic and atraumatic.  Neck: Normal range of motion. Neck supple.  TTP over R trap w/ obvious spasm No TTP over spine  Cardiovascular: Intact distal pulses.   Neurological: She is alert and oriented to person, place, and time. She has normal reflexes. No cranial nerve deficit. Coordination normal.  Skin: Skin is warm and dry.  Psychiatric: She has a normal mood and affect. Her behavior is normal. Thought content normal.  Vitals reviewed.         Assessment & Plan:  R trap spasm- new.  Reviewed dx w/ pt and husband.  Start tylenol and muscle relaxer prn.  Reviewed supportive care and red flags that should prompt return.  Pt expressed understanding and is in agreement w/ plan.

## 2016-01-03 ENCOUNTER — Telehealth: Payer: Self-pay | Admitting: Family Medicine

## 2016-01-03 DIAGNOSIS — M542 Cervicalgia: Secondary | ICD-10-CM

## 2016-01-03 NOTE — Telephone Encounter (Signed)
Please advise pt was seen for trapezius spasm.

## 2016-01-03 NOTE — Telephone Encounter (Signed)
If it is still muscular (as it was when she was here), there is no need for xray.  Continue the muscle relaxer, add tylenol or ibuprofen for additional pain relief and use a heating pad.  If she continues to have pain, I recommend she call Neurosurg and make them aware of what's going on

## 2016-01-03 NOTE — Telephone Encounter (Signed)
Pt LM on VM stating that she is still having pain from last visit with KT ans states that the Rx helps some and asking if she would need an Xray, please advise.

## 2016-01-03 NOTE — Telephone Encounter (Signed)
[place] for referral back to neurosurg- dx neck pain If the muscle relaxer is causing palpitations- she can stop it. I would recommend OTC Tylenol 325mg - 2 tabs- alternating w/ Ibuprofen 200mg - 2 tabs- every 4 hrs for pain relief Continue to use heating pad I would typically start a course of steroids but she has this on her allergy list

## 2016-01-03 NOTE — Telephone Encounter (Signed)
Pt states that she is having some palpitations due to the medication (tizanidine). Pt states that she thinks she needs to have a referral back to neurosurgery. Please advise. Pt states that she is in just as much pain as she was when she was seen.

## 2016-01-03 NOTE — Telephone Encounter (Signed)
Pt informed, referral placed.

## 2016-01-06 ENCOUNTER — Ambulatory Visit (INDEPENDENT_AMBULATORY_CARE_PROVIDER_SITE_OTHER): Payer: Medicare Other | Admitting: Student

## 2016-01-06 ENCOUNTER — Telehealth: Payer: Self-pay | Admitting: Cardiovascular Disease

## 2016-01-06 ENCOUNTER — Encounter: Payer: Self-pay | Admitting: Student

## 2016-01-06 ENCOUNTER — Encounter: Payer: Self-pay | Admitting: Cardiovascular Disease

## 2016-01-06 VITALS — BP 140/82 | HR 95 | Ht 59.0 in | Wt 103.8 lb

## 2016-01-06 DIAGNOSIS — I499 Cardiac arrhythmia, unspecified: Secondary | ICD-10-CM | POA: Diagnosis not present

## 2016-01-06 DIAGNOSIS — I10 Essential (primary) hypertension: Secondary | ICD-10-CM

## 2016-01-06 DIAGNOSIS — I493 Ventricular premature depolarization: Secondary | ICD-10-CM

## 2016-01-06 NOTE — Progress Notes (Signed)
Cardiology Office Note    Date:  01/06/2016   ID:  Mia Hernandez, DOB 09/19/1940, MRN [id number]  PCP:  Annye Asa, MD  Cardiologist: Dr. Sallyanne Kuster  Chief Complaint  Patient presents with  . Follow-up    pt states having an irregular heart rate but no chest pain no SOB no dizziness only with medication no edema     History of Present Illness:    Mia Hernandez is a 75 y.o. female with past medical history of HTN and chronic Mycobacterium Avium Complex (had reduced LV function when on medication regimen in the past, s/p partial lung resection in 09/2014) who presents to the office today for evaluation of an irregular heart beat.   In talking with the patient today, she reports being started on Zanaflex on 12/28/2015. The day following initiation of this, she realized her pulse was irregular. She continued to take the medication for her back pain but this was not helping her symptoms, therefore she stopped the medication on 01/03/2016. Since stopping the medication, she says her heart rate feels slightly more regular but she still notices extra beats at times.   Thankfully, she denies any symptoms of palpitations, chest discomfort, dyspnea with exertion, dizziness, lightheadedness, or presyncope. She reports overall feeling well except for her back discomfort. 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  She brings with her a list of recent blood pressure and pulse readings today which shows systolic readings in the [id number] to 130s. She reports good compliance with her Valsartan 160 mg daily.   Past Medical History:  Diagnosis Date  . Acute left eye pain 11/15/2014  . Adverse effect of general anesthetic    DELERIUM  . ALLERGIC RHINITIS   . Arthritis   . Bronchiectasis ([place])   . Cancer ([place])    basal cell skin cancer removed  . Chronic back pain   . Complication of  anesthesia    s/p lung surgery at [facility] X 2 MONTHS  . Dysphagia 10/13/2014  . Hydropneumothorax 11/15/2014  . Hypertension   . Hyponatremia 09/15/2014  . 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 Osteoporosis   . Stroke ([place])    TIA  SEVERAL YEARS AGO X 1 (100YRS -30 YRS AGO)  . Subcutaneous emphysema ([place]) 10/13/2014  . Toe pain 11/15/2014    Past Surgical History:  Procedure Laterality Date  . BREAST BIOPSY    . BREAST LUMPECTOMY WITH RADIOACTIVE SEED LOCALIZATION Left 10/05/2015   Procedure: LEFT BREAST LUMPECTOMY WITH RADIOACTIVE SEED LOCALIZATION;  Surgeon: Excell Seltzer, MD;  Location: [facility];  Service: General;  Laterality: Left;  . CATARACT EXTRACTION W/ INTRAOCULAR LENS  IMPLANT, BILATERAL    . CHOLECYSTECTOMY    . LAMINECTOMY     cervical   . VAGINAL HYSTERECTOMY      Current Medications: Outpatient Medications Prior to Visit  Medication Sig Dispense Refill  . valsartan (DIOVAN) 160 MG tablet Take 1 tablet (160 mg total) by mouth daily. 90 tablet 3  . tiZANidine (ZANAFLEX) 2 MG tablet Take 1 tablet (2 mg total) by mouth every 8 (eight) hours as needed for muscle spasms. 30 tablet 0   No facility-administered medications prior to visit.      Allergies:   Ethambutol; Latex; Penicillins; Rifabutin;  Aspirin; Azithromycin; Betadine [povidone iodine]; Codeine; Iodinated diagnostic agents; Macrolides and ketolides; Morphine and related; Prednisone; and Sulfa antibiotics   Social History   Social History  . Marital status: Married    Spouse name: N/A  . Number of children: N/A  . Years of education: N/A   Occupational History  . retired    Social History Main Topics  . Smoking status: Never Smoker  . Smokeless tobacco: Never Used  . Alcohol use 0.6 oz/week    1 Glasses of wine per week     Comment: at dinner  . Drug use:  No  . Sexual activity: Not Asked   Other Topics Concern  . None   Social History Narrative  . None     Family History:  The patient's family history includes Breast cancer in her mother; Pulmonary embolism in her brother; Stroke in her father.   Review of Systems:   Please see the history of present illness.     General:  No chills, fever, night sweats or weight changes.  Cardiovascular:  No chest pain, dyspnea on exertion, edema, orthopnea, palpitations, paroxysmal nocturnal dyspnea. Positive for an irregular heart rate.  Dermatological: No rash, lesions/masses Respiratory: No cough, dyspnea Urologic: No hematuria, dysuria Abdominal:   No nausea, vomiting, diarrhea, bright red blood per rectum, melena, or hematemesis Neurologic:  No visual changes, wkns, changes in mental status. All other systems reviewed and are otherwise negative except as noted above.   Physical Exam:    VS:  BP 140/82   Pulse 95   Ht 4\' 11"  (1.499 m)   Wt 103 lb 12.8 oz (47.1 kg)   BMI 20.97 kg/m    General: Well developed, well nourished Caucasian female appearing in no acute distress. Head: Normocephalic, atraumatic, sclera non-icteric, no xanthomas, nares are without discharge.  Neck: No carotid bruits. JVD not elevated.  Lungs: Respirations regular and unlabored, without wheezes or rales.  Heart: Regular rate and rhythm with occasional ectopic beats. No S3 or S4.  No murmur, no rubs, or gallops appreciated. Abdomen: Soft, non-tender, non-distended with normoactive bowel sounds. No hepatomegaly. No rebound/guarding. No obvious abdominal masses. Msk:  Strength and tone appear normal for age. No joint deformities or effusions. Extremities: No clubbing or cyanosis. No edema.  Distal pedal pulses are 2+ bilaterally. Neuro: Alert and oriented X 3. Moves all extremities spontaneously. No focal deficits noted. Psych:  Responds to questions appropriately with a normal affect. Skin: No rashes or lesions  noted  Wt Readings from Last 3 Encounters:  01/06/16 103 lb 12.8 oz (47.1 kg)  12/28/15 104 lb (47.2 kg)  10/26/15 104 lb 2 oz (47.2 kg)     Studies/Labs Reviewed:   EKG:  EKG is ordered today.  The ekg ordered today demonstrates sinus rhythm with PVC's, HR 95, with no acute ST or T-wave changes when compared to prior tracings.   Recent Labs: 09/26/2015: BUN 6; Creatinine, Ser 0.52; Hemoglobin 14.1; Platelets 288; Potassium 4.3; Sodium 133   Lipid Panel    Component Value Date/Time   CHOL 212 (H) 03/08/2014 0834   TRIG 58 03/08/2014 0834   HDL 120 03/08/2014 0834   CHOLHDL 1.8 03/08/2014 0834   VLDL 12 03/08/2014 0834   LDLCALC 80 03/08/2014 0834    Additional studies/ records that were reviewed today include:   Echocardiogram: 10/2013 Study Conclusions  - Left ventricle: Abnormal septal motion. Posterior basal hypokinesis. The cavity size was normal. Wall thickness was increased in a pattern of  mild LVH. Systolic function was mildly reduced. The estimated ejection fraction was in the range of 45% to 50%. - Atrial septum: Mobile and redundant no obvious PFO. - Systemic veins: ? off axis image of IVC inlet to RA with 2.5 x 1 cm protrusion into lumen. - Impressions: ? tortuous aorta seen posterior to atrium  Impressions:  - ? tortuous aorta seen posterior to atrium   Assessment:    1. Irregular heart beat   2. PVC (premature ventricular contraction)   3. Essential (primary) hypertension      Plan:   In order of problems listed above:  1. Irregular Heart Beat/ PVC's - after being started on Zanaflex on 12/28/2015, she realized her pulse was irregular. She denies any symptoms of palpitations, chest discomfort, dyspnea with exertion, dizziness, lightheadedness, or presyncope.  - EKG today shows sinus rhythm with PVC's, HR 95, with no acute ST or T-wave changes when compared to prior tracings.  - with her being asymptomatic, would not pursue medical  therapy at this time. We discussed initiation of BB therapy if she became symptomatic, but she prefers to hold off for now due to her intolerance of multiple medications in the past which is certainly reasonable given her asymptomatic state.   2. HTN - Initial BP elevated at 140/82, at 134/84 on recheck. List of readings from home shows systolic readings in the [id number] - 130's.  - continue Valsartan 160mg  daily.    Medication Adjustments/Labs and Tests Ordered: Current medicines are reviewed at length with the patient today.  Concerns regarding medicines are outlined above.  Medication changes, Labs and Tests ordered today are listed in the Patient Instructions below. Patient Instructions  Medication Instructions:  Continue current medications  Labwork: None Ordered  Testing/Procedures: None Ordered  Follow-Up: Your physician recommends that you schedule a follow-up appointment in: August 2018 with Dr Sallyanne Kuster  Any Other Special Instructions Will Be Listed Below (If Applicable).      HAPPY NEW YEARS  If you need a refill on your cardiac medications before your next appointment, please call your pharmacy.   Arna Medici, [place]  01/06/2016 5:51 PM    [facility] [place], [address], [place]  [address] Phone: [phone]; Fax: [phone]  [address] [place], [place] [address] Phone: [phone]

## 2016-01-06 NOTE — Patient Instructions (Signed)
Medication Instructions:  Continue current medications  Labwork: None Ordered  Testing/Procedures: None Ordered  Follow-Up: Your physician recommends that you schedule a follow-up appointment in: August 2018 with Dr Sallyanne Kuster  Any Other Special Instructions Will Be Listed Below (If Applicable).      HAPPY NEW YEARS   If you need a refill on your cardiac medications before your next appointment, please call your pharmacy.

## 2016-01-06 NOTE — Telephone Encounter (Signed)
Pt of Dr. Kerney Elbe to patient. She's had a recurrence of palpitations, but denies SOB, chest discomfort, other symptoms. She's unsure about the HR specifically. She had some anxiety related to this. Pt expresses that she had recent medication changes that she feels may have brought her symptoms on. She was on Zanaflex and thinks this caused her palpitations. She discontinued this 4 days ago and has still had the palpitations - believes they've diminished in severity. Notes she is only currently taking valsartan daily.  She was initially disinterested in evaluation unless she could see Dr. Sallyanne Kuster. She did want to see if EKG could be done. I made her aware we could see her for APP evaluation today and she was agreeable to this. Added for Brittany's schedule today. Pt made aware of appt details.

## 2016-01-06 NOTE — Telephone Encounter (Signed)
Pt says her heart have been skipping a lot,this started about 4 days ago.Please call asap to advise.

## 2016-01-16 ENCOUNTER — Encounter: Payer: Self-pay | Admitting: Cardiovascular Disease

## 2016-01-24 DIAGNOSIS — M47812 Spondylosis without myelopathy or radiculopathy, cervical region: Secondary | ICD-10-CM | POA: Diagnosis not present

## 2016-01-30 DIAGNOSIS — M47812 Spondylosis without myelopathy or radiculopathy, cervical region: Secondary | ICD-10-CM | POA: Diagnosis not present

## 2016-02-02 DIAGNOSIS — M47812 Spondylosis without myelopathy or radiculopathy, cervical region: Secondary | ICD-10-CM | POA: Diagnosis not present

## 2016-02-07 DIAGNOSIS — M47812 Spondylosis without myelopathy or radiculopathy, cervical region: Secondary | ICD-10-CM | POA: Diagnosis not present

## 2016-02-23 DIAGNOSIS — M47812 Spondylosis without myelopathy or radiculopathy, cervical region: Secondary | ICD-10-CM | POA: Diagnosis not present

## 2016-02-24 ENCOUNTER — Other Ambulatory Visit: Payer: Self-pay | Admitting: Neurosurgery

## 2016-02-24 DIAGNOSIS — M47812 Spondylosis without myelopathy or radiculopathy, cervical region: Secondary | ICD-10-CM

## 2016-03-04 ENCOUNTER — Ambulatory Visit
Admission: RE | Admit: 2016-03-04 | Discharge: 2016-03-04 | Disposition: A | Discharge status: Home / Self Care | Payer: Medicare Other | Source: Ambulatory Visit | Attending: Neurosurgery | Admitting: Neurosurgery

## 2016-03-04 DIAGNOSIS — M47812 Spondylosis without myelopathy or radiculopathy, cervical region: Secondary | ICD-10-CM

## 2016-03-04 DIAGNOSIS — M50221 Other cervical disc displacement at C4-C5 level: Secondary | ICD-10-CM | POA: Diagnosis not present

## 2016-03-04 DIAGNOSIS — M50222 Other cervical disc displacement at C5-C6 level: Secondary | ICD-10-CM | POA: Diagnosis not present

## 2016-03-07 DIAGNOSIS — M47812 Spondylosis without myelopathy or radiculopathy, cervical region: Secondary | ICD-10-CM | POA: Diagnosis not present

## 2016-03-16 DIAGNOSIS — M25511 Pain in right shoulder: Secondary | ICD-10-CM | POA: Diagnosis not present

## 2016-03-30 ENCOUNTER — Ambulatory Visit (INDEPENDENT_AMBULATORY_CARE_PROVIDER_SITE_OTHER)
Admission: RE | Admit: 2016-03-30 | Discharge: 2016-03-30 | Disposition: A | Discharge status: Home / Self Care | Payer: Medicare Other | Source: Ambulatory Visit | Attending: Internal Medicine | Admitting: Internal Medicine

## 2016-03-30 ENCOUNTER — Ambulatory Visit (INDEPENDENT_AMBULATORY_CARE_PROVIDER_SITE_OTHER): Payer: Medicare Other | Admitting: Internal Medicine

## 2016-03-30 ENCOUNTER — Encounter: Payer: Self-pay | Admitting: Internal Medicine

## 2016-03-30 VITALS — BP 120/68 | HR 74 | Ht 60.0 in | Wt 103.4 lb

## 2016-03-30 DIAGNOSIS — R0609 Other forms of dyspnea: Secondary | ICD-10-CM | POA: Diagnosis not present

## 2016-03-30 DIAGNOSIS — Z902 Acquired absence of lung [part of]: Secondary | ICD-10-CM

## 2016-03-30 DIAGNOSIS — J479 Bronchiectasis, uncomplicated: Secondary | ICD-10-CM

## 2016-03-30 DIAGNOSIS — Z9010 Acquired absence of unspecified breast and nipple: Secondary | ICD-10-CM | POA: Diagnosis not present

## 2016-03-30 DIAGNOSIS — J90 Pleural effusion, not elsewhere classified: Secondary | ICD-10-CM | POA: Diagnosis not present

## 2016-03-30 DIAGNOSIS — A310 Pulmonary mycobacterial infection: Secondary | ICD-10-CM

## 2016-03-30 DIAGNOSIS — R05 Cough: Secondary | ICD-10-CM | POA: Diagnosis not present

## 2016-03-30 MED ORDER — UMECLIDINIUM-VILANTEROL 62.5-25 MCG/INH IN AEPB: 1.0000 | INHALATION_SPRAY | Freq: Every day | RESPIRATORY_TRACT | 0 refills | Status: DC

## 2016-03-30 NOTE — Assessment & Plan Note (Signed)
There may be low-grade slow progression. Is not obvious. I don't think it explains her increased dyspnea which is more consistent with obstructive airways disease.

## 2016-03-30 NOTE — Assessment & Plan Note (Signed)
Mostly scarring and residual loculated fluid right base post lobectomy

## 2016-03-30 NOTE — Progress Notes (Signed)
HPI female never smoker followed for cough, bronchiectasis/MAIC , obstructive airway disease, history of myocarditis. Cavitary lesion with history of hemoptysis resected/ RML/RLL lobectomy 09/2014 , right pleural effusion, left breast cancer/seeds  +MAIC/ triple therapy begun 06/18/11- dc'd 2015 by Cardiology due to myocarditis ? Due to zith?.  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 FVC 1.76/77%, FEV1 1.12/65%, ratio 0.63, FEF 25-75% 0.59/42%. -----------------------------------------------------------------------------------------  09/27/2015-76 year old female never smoker followed for cough +MAIC/ triple therapy began 06/18/11- dc'd 2015 by Cardiology due to myocarditis ? Due to zith?. Cavitary lesion with history of hemoptysis resected, RML/RLL lobectomy 09/2014 for bronchiectasis, right pleural effusion FOLLOWS FOR: reports breathing is unchanged since last.  due for lumpectomy on 9/27    Husband is here Cardiology is managing her blood pressure. Breathing okay with no acute issues. Tends to cough a little if outdoors doing gardening but mask does not help this. CXR 08/04/2015 IMPRESSION: COPD, postsurgical changes on the right. No significant change since the previous study. If the patient's symptoms warrant further evaluation, chest CT scanning would be a useful next imaging modality. Aortic atherosclerosis. Electronically Signed   By: David  Martinique M.D.   On: 08/04/2015 11:24  03/30/2016- 76 year old female never smoker followed for cough, bronchiectasis, obstructive airway disease , history of myocarditis Cavitary lesion with history of hemoptysis resected, RML/RLL lobectomy 09/2014 for bronchiectasis, right pleural effusion, left breast cancer/seeds/partial  mastect. +MAIC/ triple therapy began 06/18/11- dc'd 2015 by Cardiology due to myocarditis ? Due to zith?.  FOLLOWS FOR: Pt states she is doing well but has noticed on exertion she tires easily and hard to breathe. Pt notes sinus drainage in throat. Has noticed more dyspnea on exertion cleaning house, climbing stairs, bending over. Occasional paroxysmal cough with only clear mucus. Denies night sweats or fever, blood or adenopathy. Has had some painful muscle spasms and shoulder/back being treated. Cardiology treated palpitation and HBP. Office Spirometry 03/30/2016-moderate airway obstruction. FVC 1.76/77%, FEV1 1.12/65%, ratio 0.63, FEF 25-75% 0.59/42%.  ROS- see HPI    + = pos Constitutional:   No-   weight loss, night sweats, fevers, chills, fatigue, lassitude. HEENT:   No-  headaches, difficulty swallowing, tooth/dental problems, sore throat,       No-  sneezing, itching, ear ache, nasal congestion, + post nasal drip,  CV:  No-   chest pain, orthopnea, PND, swelling in lower extremities, anasarca, dizziness, palpitations Resp: +  shortness of breath with exertion or at rest.              +  productive cough,  little non-productive cough,  No recent coughing up of blood.              No-   change in color of mucus.  No- wheezing.   Skin: No-   rash or lesions. GI:  No-   heartburn, indigestion, abdominal pain, nausea, vomiting,  GU:  MS:  No-   joint pain or swelling.   Neuro-     nothing unusual Psych:  No- change in mood or affect. No depression or anxiety.  No memory loss.  OBJ- Physical Exam  General- Alert, Oriented, Affect-appropriate, Distress- none acute, +slender, talkative Skin- rash-none, lesions- none, excoriation- none Lymphadenopathy- none Head- atraumatic            Eyes- Gross vision intact, PERRLA, conjunctivae and secretions clear  Ears- Hearing aides            Nose- Clear, no-Septal dev, mucus, polyps, erosion, perforation             Throat- Mallampati  II , mucosa clear , drainage- none, tonsils- atrophic Neck- flexible , trachea midline, no stridor , thyroid nl, carotid no bruit Chest - symmetrical excursion , unlabored           Heart/CV- RRR , no murmur , no gallop  , no rub, nl s1 s2                           - JVD- none , edema- none, stasis changes- none, varices- none           Lung- + trace squeak left base, without wheeze or rhonchi, cough- none , dullness+ right base, rub- none           Chest wall-  Abd-  Br/ Gen/ Rectal- Not done, not indicated Extrem- cyanosis- none, clubbing, none, atrophy- none, strength- nl Neuro- grossly intact to observation

## 2016-03-30 NOTE — Assessment & Plan Note (Addendum)
I'm not sure that bronchiectasis explains the worsening obstructive airways disease pattern. She may have an asthma/bronchitis process, but never smoked. Plan-try sample Anoro with discussion

## 2016-03-30 NOTE — Patient Instructions (Signed)
Order- CXR dx bronchiectasis, R middle and lower lobectomy, L partial mastectomy  Order- office spirometry    Dx dyspnea on exertion  Sample Anoro Ellipta   Inhale 1 puff once daily maintenance  Please callas needed

## 2016-03-31 ENCOUNTER — Encounter: Payer: Self-pay | Admitting: Cardiovascular Disease

## 2016-03-31 ENCOUNTER — Encounter: Payer: Self-pay | Admitting: Internal Medicine

## 2016-04-02 NOTE — Telephone Encounter (Signed)
I expect the valsartan to control this rhythm- I considered the issue when I suggested trying Anoro. This will be a problem with most of the kinds of medicines that will open tight airways, so let's see how it does.

## 2016-04-02 NOTE — Telephone Encounter (Signed)
Dr Annamaria Boots has placed me on Anoro Ellipta to help with breathing. Side effects listed include increased BP, fast or irregular heartbeat, awareness of heartbeat, and chest pain. Should I be concerned since I already have skipped heartbeat and take Valsartan? ------------  CY please advise on pt's concerns regarding Anoro and side effects.  Thanks!

## 2016-04-03 ENCOUNTER — Other Ambulatory Visit: Payer: Self-pay

## 2016-04-03 MED ORDER — UMECLIDINIUM-VILANTEROL 62.5-25 MCG/INH IN AEPB: 1.0000 | INHALATION_SPRAY | Freq: Every day | RESPIRATORY_TRACT | 0 refills | Status: DC

## 2016-04-03 MED ORDER — UMECLIDINIUM-VILANTEROL 62.5-25 MCG/INH IN AEPB: 1.0000 | INHALATION_SPRAY | Freq: Every day | RESPIRATORY_TRACT | 1 refills | Status: DC

## 2016-04-04 ENCOUNTER — Encounter: Payer: Self-pay | Admitting: Family Medicine

## 2016-04-04 ENCOUNTER — Ambulatory Visit (INDEPENDENT_AMBULATORY_CARE_PROVIDER_SITE_OTHER): Payer: Medicare Other | Admitting: Family Medicine

## 2016-04-04 DIAGNOSIS — K589 Irritable bowel syndrome without diarrhea: Secondary | ICD-10-CM | POA: Insufficient documentation

## 2016-04-04 DIAGNOSIS — K581 Irritable bowel syndrome with constipation: Secondary | ICD-10-CM | POA: Diagnosis not present

## 2016-04-04 NOTE — Assessment & Plan Note (Signed)
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  Suspect this is stress related w/ her pulmonary condition and her increased concerns about this.  It may also have to do w/ recent addition of Anoro as pt reports this is the 1st side effect listed.  Reviewed dx, provided reassurance, and discussed dietary and lifestyle modifications to improve sxs.  Pt is not convinced.  Encouraged her to keep a stool diary to review at her upcoming appt.  If she continues to worry about this or has additional concerns, will refer to GI.  Pt expressed understanding and is in agreement w/ plan.

## 2016-04-04 NOTE — Progress Notes (Signed)
   Subjective:    Patient ID: Mia Hernandez, female    DOB: Dec 28, 1940, 76 y.o.   MRN: [id number]  HPI 'gel like material in my poop'- pt reports this has happened a few times, 'not very much'.  Pt reports ~6 weeks ago she started having intermittent constipation.  Pt started Anoro around the same time she developed 'severe constipation'- no BM in 4 days.  Relieved w/ Dulcolax.  After she had diarrhea she again noticed the mucous.  Pt is again having feeling of needed to have BM but only able to go a small amount.  No blood in stool.  No rectal pain.  Pt reports increased stress recently regarding her pulmonary condition.   Review of Systems For ROS see HPI     Objective:   Physical Exam  Constitutional: She is oriented to person, place, and time. She appears well-developed and well-nourished. No distress.  HENT:  Head: Normocephalic and atraumatic.  Pulmonary/Chest: Effort normal and breath sounds normal. No respiratory distress. She has no wheezes. She has no rales.  Abdominal: Soft. Bowel sounds are normal. She exhibits no distension. There is no tenderness. There is no rebound and no guarding.  Neurological: She is alert and oriented to person, place, and time.  Skin: Skin is warm and dry. No rash noted. No erythema.  Psychiatric: Her behavior is normal. Thought content normal.  anxious  Vitals reviewed.         Assessment & Plan:

## 2016-04-04 NOTE — Patient Instructions (Signed)
Follow up as needed/scheduled The symptoms you are describing are consistent w/ IBS (irritable bowel) and are likely due to the stress you are under Increase your water intake Try and get regular exercise/activity If you get backed up, take 1 capful of Miralax in 8 oz of liquid to help things move along Keep a stool diary and see if you can relate your bowel changes to any med changes/stressors/etc Call with any questions or concerns Hang in there!!!

## 2016-04-25 ENCOUNTER — Ambulatory Visit: Payer: Medicare Other | Admitting: Family Medicine

## 2016-04-25 NOTE — Progress Notes (Signed)
Subjective:   Mia Hernandez is a 76 y.o. female who presents for Medicare Annual (Subsequent) preventive examination.  Review of Systems:  No ROS.  Medicare Wellness Visit.   Cardiac Risk Factors include: advanced age (>27men, >16 women);hypertension;family history of premature cardiovascular disease   Sleep patterns: Sleeps 8-10 hours, up to void x 2.  Home Safety/Smoke Alarms:  Smoke detectors and security in place.  Living environment; residence and Firearm Safety: Lives with husband in 2 story home with rail. Feels safe in home.  Seat Belt Safety/Bike Helmet: Wears seat belt.   Counseling:   Eye Exam-Last exam 2017, yearly by [facility] exam 08/2015, every 6 months by [facility]   Female:   Pap-09/19/2011    Mammo-06/07/2015, masses (left). Ultrasound (06/14/15) and Bx (06/16/15) performed. Partial mastectomy (left 09/2015) Dexa scan-07/09/2012. Declines further testing.    CCS-Colonoscopy 01/29/2008, Diverticulosis. Recall 10 years.      Objective:     Vitals: BP 112/70 (BP Location: Right Arm, Patient Position: Sitting, Cuff Size: Normal)   Pulse 72   Temp 97.8 F (36.6 C) (Oral)   Resp 18   Ht 5' (1.524 m)   Wt 103 lb (46.7 kg)   SpO2 98%   BMI 20.12 kg/m   Body mass index is 20.12 kg/m.   Tobacco History  Smoking Status  . Never Smoker  Smokeless Tobacco  . Never Used     Counseling given: No   Past Medical History:  Diagnosis Date  . Acute left eye pain 11/15/2014  . Adverse effect of general anesthetic    DELERIUM  . ALLERGIC RHINITIS   . Arthritis   . Bronchiectasis ([place])   . Cancer ([place])    basal cell skin cancer removed  . Chronic back pain   . Complication of anesthesia    s/p lung surgery at [place] X 2 MONTHS  . Dysphagia 10/13/2014  . Hydropneumothorax 11/15/2014  . Hypertension   . Hyponatremia 09/15/2014  . 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 Osteoporosis   . Stroke ([place])    TIA  SEVERAL YEARS AGO X 1 (70YRS -30 YRS AGO)  . Subcutaneous emphysema ([place]) 10/13/2014  . Toe pain 11/15/2014   Past Surgical History:  Procedure Laterality Date  . BREAST BIOPSY    . BREAST LUMPECTOMY WITH RADIOACTIVE SEED LOCALIZATION Left 10/05/2015   Procedure: LEFT BREAST LUMPECTOMY WITH RADIOACTIVE SEED LOCALIZATION;  Surgeon: Excell Seltzer, MD;  Location: [place];  Service: General;  Laterality: Left;  . CATARACT EXTRACTION W/ INTRAOCULAR LENS  IMPLANT, BILATERAL    . CHOLECYSTECTOMY    . LAMINECTOMY     cervical   . VAGINAL HYSTERECTOMY     Family History  Problem Relation Age of Onset  . Breast cancer Mother   . Stroke Father   . Pulmonary embolism Brother    History  Sexual Activity  . Sexual activity: Not on file    Outpatient Encounter Prescriptions as of 04/26/2016  Medication Sig  . umeclidinium-vilanterol (ANORO ELLIPTA) 62.5-25 MCG/INH AEPB Inhale 1 puff into the lungs daily.  . valsartan (DIOVAN) 160 MG tablet Take 1 tablet (160 mg total) by mouth daily.   No facility-administered encounter medications on file as of 04/26/2016.     Activities of Daily Living In your present state of health, do you have any difficulty performing the following activities: 04/26/2016  10/26/2015  Hearing? N Y  Vision? N N  Difficulty concentrating or making decisions? N N  Walking or climbing stairs? N N  Dressing or bathing? N N  Doing errands, shopping? N N  Preparing Food and eating ? N N  Using the Toilet? N N  In the past six months, have you accidently leaked urine? N N  Do you have problems with loss of bowel control? N N  Managing your Medications? N N  Managing your Finances? N N  Housekeeping or managing your Housekeeping? N N  Some recent data might be hidden    Patient Care Team: Midge Minium, MD as PCP - General (Family Medicine) Truman Hayward,  MD as Consulting Physician (Infectious Diseases) Sanda Klein, MD as Consulting Physician (Cardiology) Deneise Lever, MD as Consulting Physician (Pulmonary Disease) [facility]    Assessment:    Physical assessment deferred to PCP.  Exercise Activities and Dietary recommendations Exercise limited by: None identified   Diet (meal preparation, eat out, water intake, caffeinated beverages, dairy products, fruits and vegetables): Drinks coffee and water.  Breakfast: English muffin (half), fruit, egg, coffee Lunch: sandwich, yogurt, salad Dinner: vegetables, lean meat   Encouraged to continue heart healthy diet and remain as active as possible.   Goals      Patient Stated   . patient (pt-stated)          Would like to relax more, but stay active.       Fall Risk Fall Risk  04/26/2016 10/26/2015 04/04/2015 12/16/2014 11/15/2014  Falls in the past year? No No No No No   Depression Screen PHQ 2/9 Scores 04/26/2016 10/26/2015 12/16/2014 11/15/2014  PHQ - 2 Score 0 0 0 0     Cognitive Function       Ad8 score reviewed for issues:  Issues making decisions: no  Less interest in hobbies / activities:  no  Repeats questions, stories (family complaining): no  Trouble using ordinary gadgets (microwave, computer, phone): no  Forgets the month or year: no  Mismanaging finances: no  Remembering appts: no  Daily problems with thinking and/or memory: no Ad8 score is=0     Immunization History  Administered Date(s) Administered  . Influenza,inj,Quad PF,36+ Mos 12/08/2012, 09/27/2015  . Influenza-Unspecified 10/29/2013, 09/09/2014  . PPD Test 04/18/2011  . Pneumococcal Conjugate-13 02/16/2013  . Pneumococcal Polysaccharide-23 08/12/2014  . Tdap 08/16/2014  . Zoster 02/26/2011   Screening Tests Health Maintenance  Topic Date Due  . DEXA SCAN  10/08/2016 (Originally  10/25/2004)  . INFLUENZA VACCINE  08/08/2016  . TETANUS/TDAP  08/15/2024  . PNA vac Low Risk Adult  Completed      Plan:     Bring a copy of your advance directives to your next office visit.  Continue to eat heart healthy diet (full of fruits, vegetables, whole grains, lean protein, water--limit salt, fat, and sugar intake) and increase physical activity as tolerated.  Continue doing brain stimulating activities (puzzles, reading, adult coloring books, staying active) to keep memory sharp.   During the course of the visit the patient was educated and counseled about the following appropriate screening and preventive services:   Vaccines to include Pneumoccal, Influenza, Hepatitis B, Td, Zostavax, HCV  Cardiovascular Disease  Colorectal cancer screening  Bone density screening  Diabetes screening  Glaucoma screening  Mammography/PAP  Nutrition counseling   Patient Instructions (the written plan) was given to the patient.  Gerilyn Nestle, RN  04/26/2016   Agree w/ documentation above.  Annye Asa, MD

## 2016-04-25 NOTE — Progress Notes (Signed)
Pre visit review using our clinic review tool, if applicable. No additional management support is needed unless otherwise documented below in the visit note. 

## 2016-04-26 ENCOUNTER — Ambulatory Visit (INDEPENDENT_AMBULATORY_CARE_PROVIDER_SITE_OTHER): Payer: Medicare Other | Admitting: Family Medicine

## 2016-04-26 ENCOUNTER — Encounter: Payer: Self-pay | Admitting: Family Medicine

## 2016-04-26 VITALS — BP 112/70 | HR 72 | Temp 97.8°F | Resp 18 | Ht 60.0 in | Wt 103.0 lb

## 2016-04-26 DIAGNOSIS — Z0000 Encounter for general adult medical examination without abnormal findings: Secondary | ICD-10-CM

## 2016-04-26 DIAGNOSIS — I10 Essential (primary) hypertension: Secondary | ICD-10-CM

## 2016-04-26 LAB — CBC WITH DIFFERENTIAL/PLATELET
BASOS PCT: 0.6 % (ref 0.0–3.0)
Basophils Absolute: 0 10*3/uL (ref 0.0–0.1)
EOS PCT: 13.7 % — AB (ref 0.0–5.0)
Eosinophils Absolute: 0.8 10*3/uL — ABNORMAL HIGH (ref 0.0–0.7)
HCT: 41.6 % (ref 36.0–46.0)
Hemoglobin: 13.7 g/dL (ref 12.0–15.0)
LYMPHS ABS: 1.8 10*3/uL (ref 0.7–4.0)
Lymphocytes Relative: 30.1 % (ref 12.0–46.0)
MCHC: 32.9 g/dL (ref 30.0–36.0)
MCV: 92.1 fl (ref 78.0–100.0)
MONO ABS: 0.7 10*3/uL (ref 0.1–1.0)
Monocytes Relative: 11.7 % (ref 3.0–12.0)
NEUTROS PCT: 43.9 % (ref 43.0–77.0)
Neutro Abs: 2.6 10*3/uL (ref 1.4–7.7)
Platelets: 231 10*3/uL (ref 150.0–400.0)
RBC: 4.52 Mil/uL (ref 3.87–5.11)
RDW: 14 % (ref 11.5–15.5)
WBC: 6 10*3/uL (ref 4.0–10.5)

## 2016-04-26 LAB — HEPATIC FUNCTION PANEL
ALBUMIN: 4.5 g/dL (ref 3.5–5.2)
ALK PHOS: 82 U/L (ref 39–117)
ALT: 19 U/L (ref 0–35)
AST: 25 U/L (ref 0–37)
Bilirubin, Direct: 0.1 mg/dL (ref 0.0–0.3)
Total Bilirubin: 0.5 mg/dL (ref 0.2–1.2)
Total Protein: 7.4 g/dL (ref 6.0–8.3)

## 2016-04-26 LAB — BASIC METABOLIC PANEL
BUN: 10 mg/dL (ref 6–23)
CHLORIDE: 99 meq/L (ref 96–112)
CO2: 30 meq/L (ref 19–32)
Calcium: 9.6 mg/dL (ref 8.4–10.5)
Creatinine, Ser: 0.65 mg/dL (ref 0.40–1.20)
GFR: 94.07 mL/min (ref >60.00)
Glucose, Bld: 94 mg/dL (ref 70–99)
POTASSIUM: 3.9 meq/L (ref 3.5–5.1)
Sodium: 135 mEq/L (ref 135–145)

## 2016-04-26 LAB — LIPID PANEL
CHOL/HDL RATIO: 2
CHOLESTEROL: 185 mg/dL (ref 0–200)
HDL: 79.4 mg/dL (ref >39.00)
LDL Cholesterol: 90 mg/dL (ref 0–99)
NonHDL: 105.21
TRIGLYCERIDES: 77 mg/dL (ref 0.0–149.0)
VLDL: 15.4 mg/dL (ref 0.0–40.0)

## 2016-04-26 LAB — TSH: TSH: 1.53 u[IU]/mL (ref 0.35–4.50)

## 2016-04-26 NOTE — Progress Notes (Signed)
   Subjective:    Patient ID: Mia Hernandez, female    DOB: 04/09/1940, 76 y.o.   MRN: [id number]  HPI HTN- chronic problem.  On Valsartan daily w/ good control.  Denies CP, SOB above baseline- improved since starting Anoro, HAs, visual changes, edema.   Review of Systems For ROS see HPI     Objective:   Physical Exam  Constitutional: She is oriented to person, place, and time. She appears well-developed and well-nourished. No distress.  HENT:  Head: Normocephalic and atraumatic.  Eyes: Conjunctivae and EOM are normal. Pupils are equal, round, and reactive to light.  Neck: Normal range of motion. Neck supple. No thyromegaly present.  Cardiovascular: Normal rate, regular rhythm, normal heart sounds and intact distal pulses.   No murmur heard. Pulmonary/Chest: Effort normal and breath sounds normal. No respiratory distress.  Abdominal: Soft. She exhibits no distension. There is no tenderness.  Musculoskeletal: She exhibits no edema.  Lymphadenopathy:    She has no cervical adenopathy.  Neurological: She is alert and oriented to person, place, and time.  Skin: Skin is warm and dry.  Psychiatric: She has a normal mood and affect. Her behavior is normal.  Vitals reviewed.         Assessment & Plan:

## 2016-04-26 NOTE — Patient Instructions (Addendum)
Follow up in 6 months to recheck BP We'll notify you of your lab results and make any changes if needed Continue to work on healthy diet and exercise as able- you look great! You no longer need colonoscopy- yay!!! You are up to date on mammo until June- yay! Call with any questions or concerns [place]!!!   Bring a copy of your advance directives to your next office visit.  Continue to eat heart healthy diet (full of fruits, vegetables, whole grains, lean protein, water--limit salt, fat, and sugar intake) and increase physical activity as tolerated.  Continue doing brain stimulating activities (puzzles, reading, adult coloring books, staying active) to keep memory sharp.     Health Maintenance, Female Adopting a healthy lifestyle and getting preventive care can go a long way to promote health and wellness. Talk with your health care provider about what schedule of regular examinations is right for you. This is a good chance for you to check in with your provider about disease prevention and staying healthy. In between checkups, there are plenty of things you can do on your own. Experts have done a lot of research about which lifestyle changes and preventive measures are most likely to keep you healthy. Ask your health care provider for more information. Weight and diet Eat a healthy diet  Be sure to include plenty of vegetables, fruits, low-fat dairy products, and lean protein.  Do not eat a lot of foods high in solid fats, added sugars, or salt.  Get regular exercise. This is one of the most important things you can do for your health.  Most adults should exercise for at least 150 minutes each week. The exercise should increase your heart rate and make you sweat (moderate-intensity exercise).  Most adults should also do strengthening exercises at least twice a week. This is in addition to the moderate-intensity exercise. Maintain a healthy weight  Body mass index (BMI) is a  measurement that can be used to identify possible weight problems. It estimates body fat based on height and weight. Your health care provider can help determine your BMI and help you achieve or maintain a healthy weight.  For females 87 years of age and older:  A BMI below 18.5 is considered underweight.  A BMI of 18.5 to 24.9 is normal.  A BMI of 25 to 29.9 is considered overweight.  A BMI of 30 and above is considered obese. Watch levels of cholesterol and blood lipids  You should start having your blood tested for lipids and cholesterol at 77 years of age, then have this test every 5 years.  You may need to have your cholesterol levels checked more often if:  Your lipid or cholesterol levels are high.  You are older than 77 years of age.  You are at high risk for heart disease. Cancer screening Lung Cancer  Lung cancer screening is recommended for adults 44-80 years old who are at high risk for lung cancer because of a history of smoking.  A yearly low-dose CT scan of the lungs is recommended for people who:  Currently smoke.  Have quit within the past 15 years.  Have at least a 30-pack-year history of smoking. A pack year is smoking an average of one pack of cigarettes a day for 1 year.  Yearly screening should continue until it has been 15 years since you quit.  Yearly screening should stop if you develop a health problem that would prevent you from having lung cancer treatment.  Breast Cancer  Practice breast self-awareness. This means understanding how your breasts normally appear and feel.  It also means doing regular breast self-exams. Let your health care provider know about any changes, no matter how small.  If you are in your 20s or 30s, you should have a clinical breast exam (CBE) by a health care provider every 1-3 years as part of a regular health exam.  If you are 8 or older, have a CBE every year. Also consider having a breast X-ray (mammogram) every  year.  If you have a family history of breast cancer, talk to your health care provider about genetic screening.  If you are at high risk for breast cancer, talk to your health care provider about having an MRI and a mammogram every year.  Breast cancer gene (BRCA) assessment is recommended for women who have family members with BRCA-related cancers. BRCA-related cancers include:  Breast.  Ovarian.  Tubal.  Peritoneal cancers.  Results of the assessment will determine the need for genetic counseling and BRCA1 and BRCA2 testing. Cervical Cancer  Your health care provider may recommend that you be screened regularly for cancer of the pelvic organs (ovaries, uterus, and vagina). This screening involves a pelvic examination, including checking for microscopic changes to the surface of your cervix (Pap test). You may be encouraged to have this screening done every 3 years, beginning at age 9.  For women ages 67-65, health care providers may recommend pelvic exams and Pap testing every 3 years, or they may recommend the Pap and pelvic exam, combined with testing for human papilloma virus (HPV), every 5 years. Some types of HPV increase your risk of cervical cancer. Testing for HPV may also be done on women of any age with unclear Pap test results.  Other health care providers may not recommend any screening for nonpregnant women who are considered low risk for pelvic cancer and who do not have symptoms. Ask your health care provider if a screening pelvic exam is right for you.  If you have had past treatment for cervical cancer or a condition that could lead to cancer, you need Pap tests and screening for cancer for at least 20 years after your treatment. If Pap tests have been discontinued, your risk factors (such as having a new sexual partner) need to be reassessed to determine if screening should resume. Some women have medical problems that increase the chance of getting cervical cancer. In  these cases, your health care provider may recommend more frequent screening and Pap tests. Colorectal Cancer  This type of cancer can be detected and often prevented.  Routine colorectal cancer screening usually begins at 77 years of age and continues through 77 years of age.  Your health care provider may recommend screening at an earlier age if you have risk factors for colon cancer.  Your health care provider may also recommend using home test kits to check for hidden blood in the stool.  A small camera at the end of a tube can be used to examine your colon directly (sigmoidoscopy or colonoscopy). This is done to check for the earliest forms of colorectal cancer.  Routine screening usually begins at age 55.  Direct examination of the colon should be repeated every 5-10 years through 77 years of age. However, you may need to be screened more often if early forms of precancerous polyps or small growths are found. Skin Cancer  Check your skin from head to toe regularly.  Tell your health  care provider about any new moles or changes in moles, especially if there is a change in a mole's shape or color.  Also tell your health care provider if you have a mole that is larger than the size of a pencil eraser.  Always use sunscreen. Apply sunscreen liberally and repeatedly throughout the day.  Protect yourself by wearing long sleeves, pants, a wide-brimmed hat, and sunglasses whenever you are outside. Heart disease, diabetes, and high blood pressure  High blood pressure causes heart disease and increases the risk of stroke. High blood pressure is more likely to develop in:  People who have blood pressure in the high end of the normal range (130-139/85-89 mm Hg).  People who are overweight or obese.  People who are African American.  If you are 56-60 years of age, have your blood pressure checked every 3-5 years. If you are 37 years of age or older, have your blood pressure checked  every year. You should have your blood pressure measured twice-once when you are at a hospital or clinic, and once when you are not at a hospital or clinic. Record the average of the two measurements. To check your blood pressure when you are not at a hospital or clinic, you can use:  An automated blood pressure machine at a pharmacy.  A home blood pressure monitor.  If you are between 21 years and 26 years old, ask your health care provider if you should take aspirin to prevent strokes.  Have regular diabetes screenings. This involves taking a blood sample to check your fasting blood sugar level.  If you are at a normal weight and have a low risk for diabetes, have this test once every three years after 77 years of age.  If you are overweight and have a high risk for diabetes, consider being tested at a younger age or more often. Preventing infection Hepatitis B  If you have a higher risk for hepatitis B, you should be screened for this virus. You are considered at high risk for hepatitis B if:  You were born in a country where hepatitis B is common. Ask your health care provider which countries are considered high risk.  Your parents were born in a high-risk country, and you have not been immunized against hepatitis B (hepatitis B vaccine).  You have HIV or AIDS.  You use needles to inject street drugs.  You live with someone who has hepatitis B.  You have had sex with someone who has hepatitis B.  You get hemodialysis treatment.  You take certain medicines for conditions, including cancer, organ transplantation, and autoimmune conditions. Hepatitis C  Blood testing is recommended for:  Everyone born from [birth year] through 1966.  Anyone with known risk factors for hepatitis C. Sexually transmitted infections (STIs)  You should be screened for sexually transmitted infections (STIs) including gonorrhea and chlamydia if:  You are sexually active and are younger than 77 years of  age.  You are older than 77 years of age and your health care provider tells you that you are at risk for this type of infection.  Your sexual activity has changed since you were last screened and you are at an increased risk for chlamydia or gonorrhea. Ask your health care provider if you are at risk.  If you do not have HIV, but are at risk, it may be recommended that you take a prescription medicine daily to prevent HIV infection. This is called pre-exposure prophylaxis (PrEP). You are considered  at risk if:  You are sexually active and do not regularly use condoms or know the HIV status of your partner(s).  You take drugs by injection.  You are sexually active with a partner who has HIV. Talk with your health care provider about whether you are at high risk of being infected with HIV. If you choose to begin PrEP, you should first be tested for HIV. You should then be tested every 3 months for as long as you are taking PrEP. Pregnancy  If you are premenopausal and you may become pregnant, ask your health care provider about preconception counseling.  If you may become pregnant, take 400 to 800 micrograms (mcg) of folic acid every day.  If you want to prevent pregnancy, talk to your health care provider about birth control (contraception). Osteoporosis and menopause  Osteoporosis is a disease in which the bones lose minerals and strength with aging. This can result in serious bone fractures. Your risk for osteoporosis can be identified using a bone density scan.  If you are 105 years of age or older, or if you are at risk for osteoporosis and fractures, ask your health care provider if you should be screened.  Ask your health care provider whether you should take a calcium or vitamin D supplement to lower your risk for osteoporosis.  Menopause may have certain physical symptoms and risks.  Hormone replacement therapy may reduce some of these symptoms and risks. Talk to your health  care provider about whether hormone replacement therapy is right for you. Follow these instructions at home:  Schedule regular health, dental, and eye exams.  Stay current with your immunizations.  Do not use any tobacco products including cigarettes, chewing tobacco, or electronic cigarettes.  If you are pregnant, do not drink alcohol.  If you are breastfeeding, limit how much and how often you drink alcohol.  Limit alcohol intake to no more than 1 drink per day for nonpregnant women. One drink equals 12 ounces of beer, 5 ounces of wine, or 1 ounces of hard liquor.  Do not use street drugs.  Do not share needles.  Ask your health care provider for help if you need support or information about quitting drugs.  Tell your health care provider if you often feel depressed.  Tell your health care provider if you have ever been abused or do not feel safe at home. This information is not intended to replace advice given to you by your health care provider. Make sure you discuss any questions you have with your health care provider. Document Released: 07/10/2010 Document Revised: 06/02/2015 Document Reviewed: 09/28/2014 Elsevier Interactive Patient Education  2017 [employer].

## 2016-04-26 NOTE — Assessment & Plan Note (Signed)
Chronic problem.  Well controlled today.  Asymptomatic.  Check labs.  No anticipated med changes.  Will follow. 

## 2016-04-30 IMAGING — MR MR CARD MORPHOLOGY WO/W CM
16 of 17 series · 16 of 17 positions shown · IV contrast (multihance)
Comparison: none

CLINICAL DATA: Abnormal ECG with T wave inversions and long QT

EXAM:
CARDIAC MRI
TECHNIQUE: The patient was scanned on a 1.5 Tesla GE magnet. A dedicated
cardiac coil was used. Functional imaging was done using Fiesta
sequences. [DATE], and 4 chamber views were done to assess for RWMA's.
Modified Bodrun rule using a short axis stack was used to
calculate an ejection fraction on a dedicated work station using
Circle software. The patient received 15 cc of Multihance. After 10
minutes inversion recovery sequences were used to assess for
infiltration and scar tissue.
CONTRAST:  15 cc Multihance

[Series 3: bSSFP · sagittal · 8.0mm · 1.25mm/px · 1 of 14 slices shown (1 of 6)]
[im 1/14]
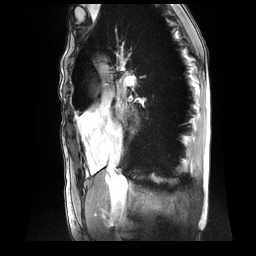

[Series 4: bSSFP · coronal · 8.0mm · 1.25mm/px · 1 of 20 slices shown (2 of 6)]
[im 1/20]
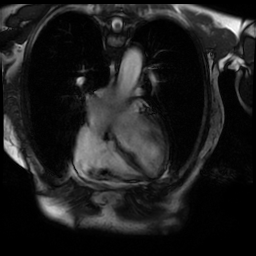

[Series 8: bSSFP · coronal · 8.0mm · 1.25mm/px · 1 of 20 slices shown (3 of 6)]
[im 1/20]
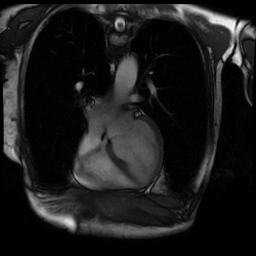

[Series 10: bSSFP · oblique · 8.0mm · 1.37mm/px · 1 of 360 slices shown (4 of 6)]
[im 1/360]
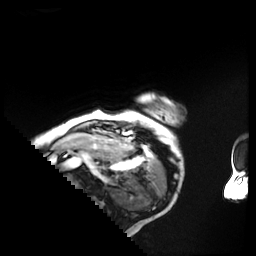

[Series 13: bSSFP · coronal · 8.0mm · 1.37mm/px · 1 of 60 slices shown (5 of 6)]
[im 1/60]
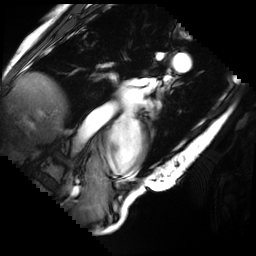

[Series 15: bSSFP · oblique · 8.0mm · 1.37mm/px · 1 of 360 slices shown (6 of 6)]
[im 1/360]
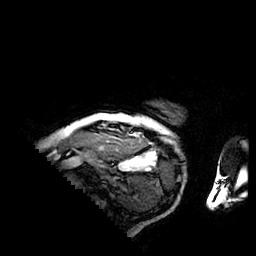

[Series 16: cine ir · oblique · 8.0mm · 1.37mm/px · 1 of 30 slices shown]
[im 1/30]
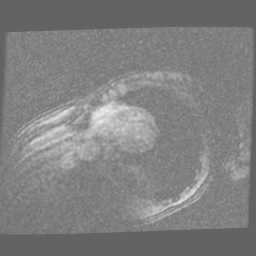

[Series 19: delayed ir prep · oblique · 8.0mm · 1.37mm/px · 1 of 15 slices shown (1 of 2)]
[im 1/15]
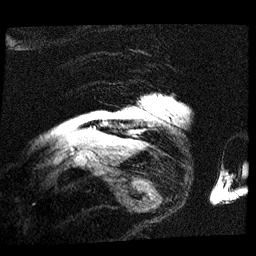

[Series 20: delayed ir prep · coronal · 8.0mm · 1.37mm/px · 1 of 3 slices shown (2 of 2)]
[im 1/3]
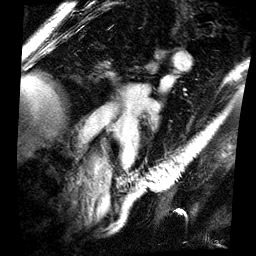

[Series 1450: long axis · oblique · 8.0mm · 2.81mm/px · 1 of 23 slices shown]
[im 1/23]
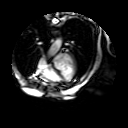

[Series 1451: sa base · oblique · 8.0mm · 2.81mm/px · 1 of 23 slices shown]
[im 1/23]
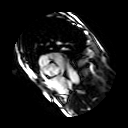

[Series 1452: sa mid · oblique · 8.0mm · 2.81mm/px · 1 of 23 slices shown]
[im 1/23]
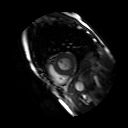

[Series 1453: sa apex · oblique · 8.0mm · 2.81mm/px · 1 of 23 slices shown]
[im 1/23]
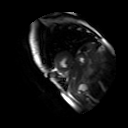

[Series 1454: 2 ch · oblique · 8.0mm · 2.81mm/px · 1 of 23 slices shown]
[im 1/23]
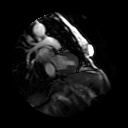

[Series 1455: 3 ch · oblique · 8.0mm · 2.81mm/px · 1 of 23 slices shown]
[im 1/23]
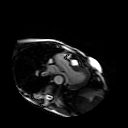

[Series 1456: 4 ch · oblique · 8.0mm · 2.81mm/px · 1 of 23 slices shown]
[im 1/23]
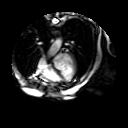

[16 of 17 positions shown; findings below may reference images not displayed]

FINDINGS: LV cavity size was normal There was mild basal septal hypertrophy 13
mm. There was no discrete RWMA. The LA, RA and RV were normal in
size and function. The ascending aorta was mildly dilated at 3.7 cm.
The arch was normal at 2.3 cm and the distal thoracic aorta normal
at 2.5 cm. There was no ASD/VSD or pericardial effusion. The
quantitative EF was 52% (EDV 79 cc ESV 38 cc SV 41 cc) Delayed
enhancement images showed a dense area of distal septal uptake and
mottled uptake in the mid and basal posterior lateral wall that was
more mid and sub epicardial. The aortic valve was mildly thickened
with mild turbulence in the root but no AS. The mitral and tricuspid
valves were normal
IMPRESSION: 1) Mild basal septal hypertrophy EF 52% no discrete RWMA

2) Delayed gadolinium uptake in the distal septum and basal and mid
posterior lateral wall that is more sub and mid epicardial

3) Normal RV size and function

4) Mild aortic root enlargement 3. cm

5) No pericardial effusion

Abimelk Tiger

## 2016-05-24 ENCOUNTER — Encounter: Payer: Self-pay | Admitting: Internal Medicine

## 2016-05-25 ENCOUNTER — Other Ambulatory Visit: Payer: Self-pay

## 2016-05-25 MED ORDER — ALBUTEROL SULFATE HFA 108 (90 BASE) MCG/ACT IN AERS: 2.0000 | INHALATION_SPRAY | RESPIRATORY_TRACT | 3 refills | Status: DC | PRN

## 2016-05-25 NOTE — Telephone Encounter (Signed)
I do recommend she have a arescu inhaler for occasional use  Albuterol HFA, # 3,   Inhale 2 puffs every 6 hours as needed for breathing, refill x 3

## 2016-05-25 NOTE — Telephone Encounter (Signed)
CY please see email from pt. Thanks. Current Outpatient Prescriptions on File Prior to Visit  Medication Sig Dispense Refill  . umeclidinium-vilanterol (ANORO ELLIPTA) 62.5-25 MCG/INH AEPB Inhale 1 puff into the lungs daily. 90 each 1  . valsartan (DIOVAN) 160 MG tablet Take 1 tablet (160 mg total) by mouth daily. 90 tablet 3   No current facility-administered medications on file prior to visit.

## 2016-06-11 ENCOUNTER — Encounter: Payer: Self-pay | Admitting: Internal Medicine

## 2016-06-11 ENCOUNTER — Encounter: Payer: Self-pay | Admitting: Cardiovascular Disease

## 2016-06-12 ENCOUNTER — Telehealth: Payer: Self-pay | Admitting: Internal Medicine

## 2016-06-12 MED ORDER — UMECLIDINIUM BROMIDE 62.5 MCG/INH IN AEPB: 1.0000 | INHALATION_SPRAY | Freq: Every day | RESPIRATORY_TRACT | 0 refills | Status: DC

## 2016-06-12 NOTE — Telephone Encounter (Signed)
Spoke with patient. She states she has been using the Anoro inhaler for the past 2 months. She has been having side effects of high BP (BP went from 120/76 to 160/86 in a matter of days), feels like heart is skipping beats, vertigo, and lower abdominal pain that gets worse after using the inhaler. She stated that the medicine has helped her breathing but she is concerned about the side effects. She has not take any other medication besides the Anoro and Diovan 160.   If a change in inhalers is needed, patient wishes to use [facility].   CY, please advise. Thanks!

## 2016-06-12 NOTE — Telephone Encounter (Signed)
We do no have any samples of the Incruse.  Pt requests that we send this to her local pharmacy and she is going to see if this is affordable.  Pt aware to contact our office if inhaler is not covered or is not affordable.  Rx sent to [facility] per patient request. Aware of rec's per Dr Annamaria Boots.  Nothing further needed.

## 2016-06-12 NOTE — Telephone Encounter (Signed)
Please give her sample Incruse Ellipta to use 1 puff, once daily  Stop Anoro  If the BP and pulse don't settle down rapidly, then she needs to contact her cardiologist.  If she likes the Incruse then we can send mail order # 3, 1 puff, once daily, ref x 3

## 2016-06-12 NOTE — Telephone Encounter (Signed)
Discussed via telephone message.  See telephone note 06/12/16 Nothing further needed.

## 2016-06-13 ENCOUNTER — Encounter: Payer: Self-pay | Admitting: Internal Medicine

## 2016-06-13 NOTE — Telephone Encounter (Signed)
Ok to stay off inhalers. Let's see how she does.

## 2016-06-13 NOTE — Telephone Encounter (Signed)
Please see email below per pt:  Since going off Anoro, I feel much better all over and BP is back down.Breathing is good at this point.Would like to stay off inhalers at this time unless you think it is not advisable.Will use the rescue inhaler if I have difficulty breathing   Please advise what response you want Korea to give her, thanks!

## 2016-06-20 ENCOUNTER — Telehealth: Payer: Self-pay | Admitting: Family Medicine

## 2016-06-20 NOTE — Telephone Encounter (Signed)
Nurse Assessment  Nurse: Sherrell Puller, RN, Amy Date/Time Eilene Ghazi Time): 06/20/2016 12:55:32 PM  Confirm and document reason for call. If symptomatic, describe symptoms. ---Caller states his wife was outside pulling on weeds and pulled too hard and fell backwards. Did not hit her head but hurt her tailbone and the pain went around to her stomach. Took shower and fainted when getting out of the shower but husband was there and caught her. Was out for about a minute. Says she is still feeling weak and was nauseated as well.  Does the patient have any new or worsening symptoms? ---Yes  Will a triage be completed? ---Yes  Related visit to physician within the last 2 weeks? ---No  Does the PT have any chronic conditions? (i.e. diabetes, asthma, etc.) ---Yes  List chronic conditions. 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  Is this a behavioral health or substance abuse call? ---No     Guidelines    Guideline Title Affirmed Question Affirmed Notes  Fainting Age > 50 years (Exception: occurred > 1 hour ago AND now feels completely fine)    Final Disposition User   Call EMS 911 Now Sherrell Puller, RN, Amy    Referrals  GO TO FACILITY REFUSED   Disagree/Comply: Disagree  Disagree/Comply Reason: Disagree with instructions

## 2016-06-20 NOTE — Telephone Encounter (Signed)
Left message for patient on both phone numbers listed.    Dr. Birdie Riddle advised that patient be check out via ER, called to discuss with patient.   Asked that patient/husband call back.

## 2016-06-20 NOTE — Telephone Encounter (Signed)
Agree- pt needs evaluation in ER to make sure no fracture and definitely to work up her syncopal episode

## 2016-06-20 NOTE — Telephone Encounter (Signed)
Spoke with patient - She is still hurting in her lower back where she fell.  She declines going to ER, but she has made an appointment with Orthopedic tomorrow morning for x-rays.    Patient is aware that if she has any additional "episodes" that her husband is to call 911.  Patient stated verbal understanding.

## 2016-06-21 DIAGNOSIS — M545 Low back pain: Secondary | ICD-10-CM | POA: Diagnosis not present

## 2016-06-21 DIAGNOSIS — M25551 Pain in right hip: Secondary | ICD-10-CM | POA: Diagnosis not present

## 2016-06-21 DIAGNOSIS — M25552 Pain in left hip: Secondary | ICD-10-CM | POA: Diagnosis not present

## 2016-06-29 ENCOUNTER — Other Ambulatory Visit: Payer: Self-pay | Admitting: Orthopedic Surgery

## 2016-06-29 ENCOUNTER — Telehealth: Payer: Self-pay | Admitting: Family Medicine

## 2016-06-29 DIAGNOSIS — S3219XA Other fracture of sacrum, initial encounter for closed fracture: Secondary | ICD-10-CM

## 2016-06-29 NOTE — Telephone Encounter (Signed)
Please advise? Pt refused ER last week for syncopal episode work up. Phone note states she saw ortho and had x-rays.

## 2016-06-29 NOTE — Telephone Encounter (Signed)
Pt LMOVM stating that she fell on 6/13. She is to see Dr. Berenice Primas on Monday regarding this, however pt is experiencing bloating in her abd area and was told to call her pcp to see if she could have an [place] or CT done to make sure no damage had been done by the fall

## 2016-06-29 NOTE — Telephone Encounter (Signed)
Called and scheduled appt with Mia Hernandez on Monday, Pt refused appt with another location/provider.

## 2016-06-29 NOTE — Telephone Encounter (Signed)
I cannot just order an [place] or CT for abdominal pain w/o assessing pt.  She would need to be seen to determine what is going on.  If no appointments available here, she can be seen at another office

## 2016-06-29 NOTE — Telephone Encounter (Signed)
Could you see if we can get her scheduled at another office as we are full today due to staffing?

## 2016-07-02 ENCOUNTER — Ambulatory Visit (INDEPENDENT_AMBULATORY_CARE_PROVIDER_SITE_OTHER): Payer: Medicare Other | Admitting: Family Medicine

## 2016-07-02 ENCOUNTER — Encounter: Payer: Self-pay | Admitting: Family Medicine

## 2016-07-02 VITALS — BP 118/81 | HR 76 | Temp 98.0°F | Resp 16 | Ht 60.0 in | Wt 98.0 lb

## 2016-07-02 DIAGNOSIS — M545 Low back pain: Secondary | ICD-10-CM | POA: Diagnosis not present

## 2016-07-02 DIAGNOSIS — K581 Irritable bowel syndrome with constipation: Secondary | ICD-10-CM

## 2016-07-02 NOTE — Assessment & Plan Note (Signed)
Deteriorated.  Since her fall and recent fractures, pt has had increased abdominal bloating but denies abdominal pain.  No N/V.  + constipation but not obstipation.  Reviewed IBS diet and supportive care.  Offered antispasmodic but pt declined at this time due to hx of multiple allergies.  Reviewed supportive care and red flags that should prompt return.  Pt expressed understanding and is in agreement w/ plan.

## 2016-07-02 NOTE — Progress Notes (Signed)
Pre visit review using our clinic review tool, if applicable. No additional management support is needed unless otherwise documented below in the visit note. 

## 2016-07-02 NOTE — Progress Notes (Signed)
   Subjective:    Patient ID: Mia Hernandez, female    DOB: [birth year], 76 y.o.   MRN: [id number]  HPI Abdominal pain- pt reports falling backwards while weeding her yard on 6/13.  She has a possible sacral and L1 fracture (seeing Dr Berenice Primas).  She was not able to take Tramadol due to side effects, 'i just can't take anything for pain'.  Ortho told her that abdominal pain is common after such a fall but recommended she be seen.  Pt reports increased abdominal bloating, spasm.  Pt reports having BMs every 2-3 days.  'it's hard to eat'- due to pain.  No N/V w/ exception of nausea from Tramadol.  No pain w/ BM, no blood.  Pt reports bloating improves w/ BM and overnight.   Review of Systems For ROS see HPI     Objective:   Physical Exam  Constitutional: She is oriented to person, place, and time. No distress.  Thin, frail appearing elderly woman  HENT:  Head: Normocephalic and atraumatic.  Abdominal: Soft. She exhibits distension (mild abdominal bloating). There is no tenderness. There is no rebound and no guarding.  Hyperactive bowel sounds  Neurological: She is alert and oriented to person, place, and time.  Skin: Skin is warm and dry.  Psychiatric: She has a normal mood and affect. Her behavior is normal. Thought content normal.  Vitals reviewed.         Assessment & Plan:

## 2016-07-02 NOTE — Patient Instructions (Signed)
Follow up as needed/scheduled Your abdominal pain and bloating appears to be due to increased stress and your irritable bowel Make sure you are drinking plenty of fluids Increase your fiber intake to help regulate your bowels If needed, take Miralax or Dulcolax to help w/ constipation Call with any questions or concerns- particularly if pain/bloating is worsening Hang in there!!!   Diet for Irritable Bowel Syndrome When you have irritable bowel syndrome (IBS), the foods you eat and your eating habits are very important. IBS may cause various symptoms, such as abdominal pain, constipation, or diarrhea. Choosing the right foods can help ease discomfort caused by these symptoms. Work with your health care provider and dietitian to find the best eating plan to help control your symptoms. What general guidelines do I need to follow?  Keep a food diary. This will help you identify foods that cause symptoms. Write down: ? What you eat and when. ? What symptoms you have. ? When symptoms occur in relation to your meals.  Avoid foods that cause symptoms. Talk with your dietitian about other ways to get the same nutrients that are in these foods.  Eat more foods that contain fiber. Take a fiber supplement if directed by your dietitian.  Eat your meals slowly, in a relaxed setting.  Aim to eat 5-6 small meals per day. Do not skip meals.  Drink enough fluids to keep your urine clear or pale yellow.  Ask your health care provider if you should take an over-the-counter probiotic during flare-ups to help restore healthy gut bacteria.  If you have cramping or diarrhea, try making your meals low in fat and high in carbohydrates. Examples of carbohydrates are pasta, rice, whole grain breads and cereals, fruits, and vegetables.  If dairy products cause your symptoms to flare up, try eating less of them. You might be able to handle yogurt better than other dairy products because it contains bacteria that  help with digestion. What foods are not recommended? The following are some foods and drinks that may worsen your symptoms:  Fatty foods, such as [place] fries.  Milk products, such as cheese or ice cream.  Chocolate.  Alcohol.  Products with caffeine, such as coffee.  Carbonated drinks, such as soda.  The items listed above may not be a complete list of foods and beverages to avoid. Contact your dietitian for more information. What foods are good sources of fiber? Your health care provider or dietitian may recommend that you eat more foods that contain fiber. Fiber can help reduce constipation and other IBS symptoms. Add foods with fiber to your diet a little at a time so that your body can get used to them. Too much fiber at once might cause gas and swelling of your abdomen. The following are some foods that are good sources of fiber:  Apples.  Peaches.  Pears.  Berries.  Figs.  Broccoli (raw).  Cabbage.  Carrots.  Raw peas.  Kidney beans.  Lima beans.  Whole grain bread.  Whole grain cereal.  Where to find more information: [employer] for Functional Gastrointestinal Disorders: www.iffgd.[employer] of Diabetes and Digestive and Kidney Diseases: NetworkAffair.co.za.aspx This information is not intended to replace advice given to you by your health care provider. Make sure you discuss any questions you have with your health care provider. Document Released: 03/17/2003 Document Revised: 06/02/2015 Document Reviewed: 03/27/2013 Elsevier Interactive Patient Education  2018 [employer].

## 2016-07-04 ENCOUNTER — Telehealth: Payer: Self-pay | Admitting: Family Medicine

## 2016-07-04 NOTE — Telephone Encounter (Signed)
Pt LMOVM stating that she is not feeling any better from Monday's visit and asking if the next step would be getting a scan done, pt did state that if a CT is ordered that she is not able to do the dye due to being allergic.

## 2016-07-04 NOTE — Telephone Encounter (Signed)
Patient notified of PCP recommendations and is agreement and expresses an understanding.  

## 2016-07-04 NOTE — Telephone Encounter (Signed)
IBS is not going to improve in 2 days- it will take longer to regulate her bowels.  She is seeing Dr Berenice Primas for her back and if she feels that she needs imaging of her spine, that will need to come from her treating provider.  There is no need to image her abdomen at this time

## 2016-07-06 ENCOUNTER — Telehealth: Payer: Self-pay | Admitting: Internal Medicine

## 2016-07-06 ENCOUNTER — Telehealth: Payer: Self-pay | Admitting: Family Medicine

## 2016-07-06 DIAGNOSIS — R1084 Generalized abdominal pain: Secondary | ICD-10-CM

## 2016-07-06 DIAGNOSIS — M47812 Spondylosis without myelopathy or radiculopathy, cervical region: Secondary | ICD-10-CM | POA: Diagnosis not present

## 2016-07-06 NOTE — Telephone Encounter (Signed)
Pt states she meant to call her PCP in regards to abdominal pain. Pt states she is going to contact her PCP. Nothing further needed.

## 2016-07-06 NOTE — Telephone Encounter (Signed)
Patient's husband, Herbie [place] calling to report patient is still no better since last appt with pcp earlier this week.  He is requesting an order from pcp for imaging of patient's abdomen.

## 2016-07-06 NOTE — Telephone Encounter (Signed)
Please advise, pt states that this problem has been on going for 15 days and she is concerned that she has something serious going on.

## 2016-07-06 NOTE — Telephone Encounter (Signed)
Pt informed that [place] was ordered. Pt states she has an appt from 1-3 today. States her pain is worsening. Korea was not placed as urgent however could we see if we could get her in sooner rather than later.

## 2016-07-06 NOTE — Telephone Encounter (Signed)
[place] for abd [place] for abd pain.  I still think this is related to her back but we can proceed w/ imaging

## 2016-07-10 ENCOUNTER — Ambulatory Visit
Admission: RE | Admit: 2016-07-10 | Discharge: 2016-07-10 | Disposition: A | Discharge status: Home / Self Care | Payer: Medicare Other | Source: Ambulatory Visit | Attending: Family Medicine | Admitting: Family Medicine

## 2016-07-10 ENCOUNTER — Other Ambulatory Visit: Payer: Medicare Other

## 2016-07-10 DIAGNOSIS — R1084 Generalized abdominal pain: Secondary | ICD-10-CM | POA: Diagnosis not present

## 2016-07-14 ENCOUNTER — Ambulatory Visit
Admission: RE | Admit: 2016-07-14 | Discharge: 2016-07-14 | Disposition: A | Discharge status: Home / Self Care | Payer: Medicare Other | Source: Ambulatory Visit | Attending: Orthopedic Surgery | Admitting: Orthopedic Surgery

## 2016-07-14 DIAGNOSIS — M549 Dorsalgia, unspecified: Secondary | ICD-10-CM | POA: Diagnosis not present

## 2016-07-14 DIAGNOSIS — S3219XA Other fracture of sacrum, initial encounter for closed fracture: Secondary | ICD-10-CM

## 2016-07-14 DIAGNOSIS — S3210XA Unspecified fracture of sacrum, initial encounter for closed fracture: Secondary | ICD-10-CM | POA: Diagnosis not present

## 2016-07-14 DIAGNOSIS — S3993XA Unspecified injury of pelvis, initial encounter: Secondary | ICD-10-CM | POA: Diagnosis not present

## 2016-07-17 DIAGNOSIS — S32010A Wedge compression fracture of first lumbar vertebra, initial encounter for closed fracture: Secondary | ICD-10-CM | POA: Diagnosis not present

## 2016-07-24 ENCOUNTER — Encounter: Payer: Self-pay | Admitting: Cardiovascular Disease

## 2016-08-10 DIAGNOSIS — S32010A Wedge compression fracture of first lumbar vertebra, initial encounter for closed fracture: Secondary | ICD-10-CM | POA: Diagnosis not present

## 2016-08-14 ENCOUNTER — Telehealth: Payer: Self-pay | Admitting: Internal Medicine

## 2016-08-14 NOTE — Telephone Encounter (Signed)
If she still has Anoro, it would be good to start that back- inhale 1 puff, once daily. If she is out then ok to send her a refill. If Anoro doesn't help after a week of use, please let us know.

## 2016-08-14 NOTE — Telephone Encounter (Signed)
Spoke with patient-she is aware to use Anoro-has inhaler at home-and will use and let us know if this works or not in 1 week-after that we can send Rx to [facility]. Nothing more needed at this time.

## 2016-08-14 NOTE — Telephone Encounter (Signed)
I have spoken with pt. Pt reports of prod cough with thick white mucus & increased sob with exertion x62mo Pt denies fever, chills or sweats. Pt has a pending apt for 10/01/16. Pt is wanting to know if she should be seen sooner or restart Anoro.  Per 06/13/16 email CY okay 'd for pt to d/c Anoro.  CY please advise. Thanks.  Current Outpatient Prescriptions on File Prior to Visit  Medication Sig Dispense Refill  . albuterol (PROVENTIL HFA;VENTOLIN HFA) 108 (90 Base) MCG/ACT inhaler Inhale 2 puffs into the lungs every 4 (four) hours as needed for wheezing or shortness of breath. 3 Inhaler 3  . umeclidinium bromide (INCRUSE ELLIPTA) 62.5 MCG/INH AEPB Inhale 1 puff into the lungs daily. 30 each 0  . umeclidinium-vilanterol (ANORO ELLIPTA) 62.5-25 MCG/INH AEPB Inhale 1 puff into the lungs daily. 90 each 1  . valsartan (DIOVAN) 160 MG tablet Take 1 tablet (160 mg total) by mouth daily. 90 tablet 3   No current facility-administered medications on file prior to visit.     Allergies  Allergen Reactions  . Ethambutol Palpitations    Possible optic neuritis, possible gout attack  . Latex Rash  . Penicillins Hives    Breaks out in hives  . Rifabutin Other (See Comments)    Loss of vision and anorexia  . Aspirin   . Azithromycin     Heart rhythm issues-prolonged QT wave  . Betadine [Povidone Iodine]   . Codeine   . Iodinated Diagnostic Agents   . 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 Morphine And Related   . Prednisone     "out of mind" state  . Sulfa Antibiotics

## 2016-08-21 ENCOUNTER — Telehealth: Payer: Self-pay | Admitting: Internal Medicine

## 2016-08-21 NOTE — Telephone Encounter (Signed)
Spoke with patient-states Mia Hernandez is working well for patient and does not need Rx until next visit. CY is aware and nothing more needed at this time.

## 2016-08-21 NOTE — Telephone Encounter (Signed)
lmtcb for pt.  

## 2016-08-21 NOTE — Telephone Encounter (Signed)
Patient returned call, CB is [phone]. °

## 2016-08-27 ENCOUNTER — Ambulatory Visit (INDEPENDENT_AMBULATORY_CARE_PROVIDER_SITE_OTHER): Payer: Medicare Other | Admitting: Family Medicine

## 2016-08-27 ENCOUNTER — Encounter: Payer: Self-pay | Admitting: Family Medicine

## 2016-08-27 VITALS — BP 120/78 | HR 81 | Temp 98.1°F | Resp 16 | Ht 60.0 in | Wt 97.4 lb

## 2016-08-27 DIAGNOSIS — K581 Irritable bowel syndrome with constipation: Secondary | ICD-10-CM | POA: Diagnosis not present

## 2016-08-27 MED ORDER — DICYCLOMINE HCL 20 MG PO TABS
20.0000 mg | ORAL_TABLET | Freq: Three times a day (TID) | ORAL | 1 refills | Status: DC
Start: 2016-08-27 — End: 2016-08-31

## 2016-08-27 NOTE — Assessment & Plan Note (Signed)
Ongoing issue for pt.  At one point in the conversation she indicates that her sxs are improving- less gas, bloating, discomfort- but then will state the opposite and say that her pain and distension is constant after every meal.  Again discussed that this is IBS.  Again encouraged probiotic and bentyl- pt again declines.  Refuses GI referral, refusing medication.  Had very frank conversation that she continues to come in for the same sxs but refuses any treatment or evaluation.  After thinking about this for a moment, she agreed that this did not make much sense and is willing to consider daily Probiotic.  Also provided bentyl for pt to use as needed.  Encouraged Beano prior to veggies.  Reviewed supportive care and red flags that should prompt return.  Pt expressed understanding and is in agreement w/ plan.

## 2016-08-27 NOTE — Progress Notes (Signed)
Pre visit review using our clinic review tool, if applicable. No additional management support is needed unless otherwise documented below in the visit note. 

## 2016-08-27 NOTE — Progress Notes (Signed)
   Subjective:    Patient ID: Mia Hernandez, female    DOB: 1940/01/17, 76 y.o.   MRN: [id number]  HPI abd bloating- it was 'getting a lot better' but today she woke up w/ abd pain.  Pt thought she had to go the bathroom but she only had 4-5 rounds of gas and pain resolved as did the bloating.  BMs were normalizing over the last 7-10 days but did not have BM yesterday and had to strain today.  Pt reports bloating, discomfort, and gas have all improved.  Pt reports she continues to bloat w/ eating.  Pt is not taking probiotic or any gas medication.  Pt eats a lot of fruits and veggies.  HTN- pt needs Valsartan switched to another ARB.  Pt doesn't want me to switch this, prefers Dr C to do it   Review of Systems For ROS see HPI     Objective:   Physical Exam  Constitutional: She is oriented to person, place, and time. She appears well-developed and well-nourished. No distress.  HENT:  Head: Normocephalic and atraumatic.  Abdominal: Soft. Bowel sounds are normal. She exhibits no distension. There is no tenderness. There is no rebound and no guarding.  Neurological: She is alert and oriented to person, place, and time.  Psychiatric:  Very anxious  Vitals reviewed.         Assessment & Plan:

## 2016-08-27 NOTE — Patient Instructions (Signed)
Follow up as needed/scheduled Start a daily probiotic such as Align to improve your abdominal cramping and bloating If you are going to eat a lot of veggies at a meal- try Beano before to decrease gas and bloating Use the Dicyclomine AS NEEDED for abdominal cramping or pain (this is an anti-spasmodic) If no improvement, please consider a GI referral Call with any questions or concerns Hang in there!!!

## 2016-08-31 ENCOUNTER — Ambulatory Visit (INDEPENDENT_AMBULATORY_CARE_PROVIDER_SITE_OTHER): Payer: Medicare Other | Admitting: Cardiovascular Disease

## 2016-08-31 VITALS — BP 140/90 | HR 77 | Wt 96.0 lb

## 2016-08-31 DIAGNOSIS — I10 Essential (primary) hypertension: Secondary | ICD-10-CM | POA: Diagnosis not present

## 2016-08-31 DIAGNOSIS — A319 Mycobacterial infection, unspecified: Secondary | ICD-10-CM | POA: Diagnosis not present

## 2016-08-31 MED ORDER — LOSARTAN POTASSIUM 50 MG PO TABS: 50.0000 mg | ORAL_TABLET | Freq: Every day | ORAL | 3 refills | Status: DC

## 2016-08-31 NOTE — Patient Instructions (Addendum)
Dr Sallyanne Kuster has recommended making the following medication changes: 1. STOP Valsartan 2. START Losartan 50 mg - take 1 tablet by mouth daily  Your physician recommends that you schedule a follow-up appointment in 12 months. You will receive a reminder letter in the mail two months in advance. If you don't receive a letter, please call our office to schedule the follow-up appointment.  If you need a refill on your cardiac medications before your next appointment, please call your pharmacy.

## 2016-09-02 ENCOUNTER — Encounter: Payer: Self-pay | Admitting: Cardiovascular Disease

## 2016-09-02 NOTE — Progress Notes (Signed)
Cardiology Office Note    Date:  09/02/2016   ID:  Mia Hernandez, DOB [birth year], MRN [id number]  PCP:  Midge Minium, MD  Cardiologist:   Sanda Klein, MD   Chief Complaint  Patient presents with  . Follow-up    VALSARTAN RECALL    History of Present Illness:  Mia Hernandez is a 76 y.o. 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 Echo and ECG subsequently normalized after his continuation of medicines.   Also she initially improved following her partial lung resection in September 2016, her mycobacterium infection is still active. She is losing weight slowly. She has an intermittent cough. He denies hemoptysis. Not had dizziness, syncope, palpitations, exertional dyspnea or angina pectoris. She denies leg edema. Today she did not complain about a rash or pruritus. She has a lot of complaints about bloating. This began after she had a fall with lumbar spine injury.  He have to switch from her valsartan to a new agents due to the recall. We've had a lot of problems finding an antihypertensive regimen that she tolerates without side effects.   Past Medical History:  Diagnosis Date  . Acute left eye pain 11/15/2014  . Adverse effect of general anesthetic    DELERIUM  . ALLERGIC RHINITIS   . Arthritis   . Bronchiectasis ([place])   . Cancer ([place])    basal cell skin cancer removed  . Chronic back pain   . Complication of anesthesia    s/p lung surgery at [facility] X 2 MONTHS  . Dysphagia 10/13/2014  . Hydropneumothorax 11/15/2014  . Hypertension   . Hyponatremia 09/15/2014  . Mycobacterium avium-intracellulare infection ([place])   . 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 Nocardia infection  09/15/2014  . 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 Osteoporosis   . Stroke ([place])    TIA  SEVERAL YEARS AGO X 1 (73YRS -30 YRS AGO)  . Subcutaneous emphysema ([place]) 10/13/2014  . Toe pain 11/15/2014    Past Surgical History:  Procedure Laterality Date  . BREAST BIOPSY    . BREAST LUMPECTOMY WITH RADIOACTIVE SEED LOCALIZATION Left 10/05/2015   Procedure: LEFT BREAST LUMPECTOMY WITH RADIOACTIVE SEED LOCALIZATION;  Surgeon: Excell Seltzer, MD;  Location: [facility];  Service: General;  Laterality: Left;  . CATARACT EXTRACTION W/ INTRAOCULAR LENS  IMPLANT, BILATERAL    . CHOLECYSTECTOMY    . LAMINECTOMY     cervical   . VAGINAL HYSTERECTOMY      Current Medications: Outpatient Medications Prior to Visit  Medication Sig Dispense Refill  . umeclidinium-vilanterol (ANORO ELLIPTA) 62.5-25 MCG/INH AEPB Inhale 1 puff into the lungs daily. 90 each 1  . albuterol (PROVENTIL HFA;VENTOLIN HFA) 108 (90 Base) MCG/ACT inhaler Inhale 2 puffs into the lungs every 4 (four) hours as needed for wheezing or shortness of breath. 3 Inhaler 3  . dicyclomine (BENTYL) 20 MG tablet Take 1 tablet (20 mg total) by mouth 3 (three) times daily before meals. (Patient not taking: Reported on 08/31/2016) 90 tablet 1  . valsartan (DIOVAN) 160 MG tablet Take 1 tablet (160 mg total) by mouth daily. 90 tablet 3   No facility-administered medications prior to visit.      Allergies:   Ethambutol; Latex; Penicillins; Prednisone; Rifabutin; Aspirin; Betadine [povidone iodine]; Codeine; Iodinated diagnostic agents; Macrolides and ketolides;  Morphine and related; Sulfa antibiotics; and Azithromycin   Social History   Social History  . Marital status: Married    Spouse name: N/A  . Number of children: N/A  . Years of education: N/A   Occupational History  . retired    Social History Main Topics  . Smoking status: Never Smoker  . Smokeless tobacco: Never Used  . Alcohol use No  . Drug use: No  . Sexual activity: Not on file    Other Topics Concern  . Not on file   Social History Narrative  . No narrative on file     Family History:  The patient's family history includes Breast cancer in her mother; Pulmonary embolism in her brother; Stroke in her father.   ROS:   Please see the history of present illness.    ROS All other systems reviewed and are negative.   PHYSICAL EXAM:   VS:  BP 140/90   Pulse 77   Wt 96 lb (43.5 kg)   BMI 18.75 kg/m    GEN: Well nourished, well developed, in no acute distress  HEENT: normal  Neck: no JVD, carotid bruits, or masses Cardiac: RRR; no murmurs, rubs, or gallops,no edema  Respiratory:  clear to auscultation bilaterally, normal work of breathing GI: soft, nontender, nondistended, + BS MS: no deformity or atrophy  Skin: warm and dry, no rash Neuro:  Alert and Oriented x 3, Strength and sensation are intact Psych: euthymic mood, full affect  Wt Readings from Last 3 Encounters:  08/31/16 96 lb (43.5 kg)  08/27/16 97 lb 6 oz (44.2 kg)  07/02/16 98 lb (44.5 kg)      Studies/Labs Reviewed:   EKG:  EKG is ordered today.  It's unchanged from before shows normal sinus rhythm, incomplete right bundle branch block, no repolarization abnormalities. The QT interval is normal.  Recent Labs: 04/26/2016: ALT 19; BUN 10; Creatinine, Ser 0.65; Hemoglobin 13.7; Platelets 231.0; Potassium 3.9; Sodium 135; TSH 1.53   Lipid Panel    Component Value Date/Time   CHOL 185 04/26/2016 1443   TRIG 77.0 04/26/2016 1443   HDL 79.40 04/26/2016 1443   CHOLHDL 2 04/26/2016 1443   VLDL 15.4 04/26/2016 1443   LDLCALC 90 04/26/2016 1443      ASSESSMENT:    1. Essential (primary) hypertension   2. Mycobacterial infection, atypical      PLAN:  In order of problems listed above:  1. HTN: Control has always been a challenge due to her numerous side effects to a variety of agents. Hopefully she'll tolerate losartan as well as she tolerated valsartan. Reevaluate blood pressure  after couple of weeks. 2. 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 This has resolved completely by clinical, echo and ECG criteria    Medication Adjustments/Labs and Tests Ordered: Current medicines are reviewed at length with the patient today.  Concerns regarding medicines are outlined above.  Medication changes, Labs and Tests ordered today are listed in the Patient Instructions below. Patient Instructions  Dr Sallyanne Kuster has recommended making the following medication changes: 1. STOP Valsartan 2. START Losartan 50 mg - take 1 tablet by mouth daily  Your physician recommends that you schedule a follow-up appointment in 12 months. You will receive a reminder letter in the mail two months in advance. If you don't receive a letter, please call our office  to schedule the follow-up appointment.  If you need a refill on your cardiac medications before your next appointment, please call your pharmacy.    Signed, Sanda Klein, MD  09/02/2016 3:37 PM    [facility] [address], [place], [place]  [address] Phone: [phone]; Fax: [phone]

## 2016-09-03 ENCOUNTER — Telehealth: Payer: Self-pay | Admitting: Internal Medicine

## 2016-09-03 NOTE — Telephone Encounter (Signed)
Spoke with pt. States that she is went back on Anoro. Reports that she is not doing well. States that she is awful muscle pain. Pt does not want to start any new medication until she comes in to see CY on 10/01/2016. Advised pt to call us if she needed medication before this date. Nothing further was needed.

## 2016-09-11 ENCOUNTER — Encounter: Payer: Self-pay | Admitting: Family Medicine

## 2016-09-11 ENCOUNTER — Ambulatory Visit (INDEPENDENT_AMBULATORY_CARE_PROVIDER_SITE_OTHER): Payer: Medicare Other | Admitting: Family Medicine

## 2016-09-11 VITALS — BP 122/84 | HR 70 | Temp 98.0°F | Resp 16 | Ht 60.0 in | Wt 97.1 lb

## 2016-09-11 DIAGNOSIS — R3989 Other symptoms and signs involving the genitourinary system: Secondary | ICD-10-CM

## 2016-09-11 DIAGNOSIS — R82998 Other abnormal findings in urine: Secondary | ICD-10-CM

## 2016-09-11 DIAGNOSIS — R8299 Other abnormal findings in urine: Secondary | ICD-10-CM | POA: Diagnosis not present

## 2016-09-11 DIAGNOSIS — K581 Irritable bowel syndrome with constipation: Secondary | ICD-10-CM | POA: Diagnosis not present

## 2016-09-11 LAB — POCT URINALYSIS DIPSTICK
Bilirubin, UA: NEGATIVE
Blood, UA: NEGATIVE
GLUCOSE UA: NEGATIVE
Spec Grav, UA: 1.01 (ref 1.010–1.025)
UROBILINOGEN UA: 0.2 U/dL
pH, UA: 7 (ref 5.0–8.0)

## 2016-09-11 NOTE — Progress Notes (Signed)
Pre visit review using our clinic review tool, if applicable. No additional management support is needed unless otherwise documented below in the visit note. 

## 2016-09-11 NOTE — Patient Instructions (Signed)
Follow up as needed/scheduled We'll notify you of your urine culture results We'll call you with your GI appt Continue to drink plenty of fluids Decrease your Dicyclomine use to 1-2x/day rather than at each meal due to constipation Call with any questions or concerns Hang in there!

## 2016-09-11 NOTE — Progress Notes (Signed)
   Subjective:    Patient ID: Manley Mason, female    DOB: [birth year], 76 y.o.   MRN: [id number]  HPI Change in urine- pt continues to complain of bloating but improves w/ bentyl and Beano.  Pt reports urine is darker in color.  Denies urinary odor.  No dysuria.  No urgency or frequency.  No fevers or chills.   Review of Systems For ROS see HPI     Objective:   Physical Exam  Constitutional: She is oriented to person, place, and time.  Thin, frail, elderly woman  HENT:  Head: Normocephalic and atraumatic.  Abdominal: Soft. Bowel sounds are normal. She exhibits no distension. There is no tenderness. There is no rebound and no guarding.  Neurological: She is alert and oriented to person, place, and time.  Skin: Skin is warm and dry.  Psychiatric: Her behavior is normal. Thought content normal.  Very anxious  Vitals reviewed.         Assessment & Plan:  IBS- pt reports improvement in gas and bloating w/ bentyl and beano but continues to have issues that cause her extreme anxiety.  Bentyl is now causing some constipation.  Discussed decreasing the frequency of use.  Again discussed GI referral and again she declined but after more discussion about her severe anxiety about her ongoing GI sxs, husband and I convinced her to accept referral.  Again discussed dietary and lifestyle therapies.  Will follow.  Abnormal urine- new.  Pt's UA is abnormal but pt has no other sxs.  Based on her multiple medication allergies will await culture results prior to treatment.

## 2016-09-12 LAB — URINE CULTURE: ORGANISM ID, BACTERIA: NO GROWTH

## 2016-09-13 ENCOUNTER — Encounter: Payer: Self-pay | Admitting: *Deleted

## 2016-09-13 ENCOUNTER — Other Ambulatory Visit: Payer: Self-pay

## 2016-09-13 ENCOUNTER — Ambulatory Visit (INDEPENDENT_AMBULATORY_CARE_PROVIDER_SITE_OTHER): Payer: Medicare Other | Admitting: Gastroenterology

## 2016-09-13 VITALS — BP 130/64 | HR 68

## 2016-09-13 DIAGNOSIS — R140 Abdominal distension (gaseous): Secondary | ICD-10-CM

## 2016-09-13 MED ORDER — RIFAXIMIN 550 MG PO TABS: 550.0000 mg | ORAL_TABLET | Freq: Three times a day (TID) | ORAL | 0 refills | Status: DC

## 2016-09-13 NOTE — Addendum Note (Signed)
Addended by: Doristine Counter on: 09/13/2016 02:32 PM   Modules accepted: Orders

## 2016-09-13 NOTE — Patient Instructions (Addendum)
Continue Beano. You may also try simethicon or gas-x over the counter.  Discontinue your probiotic and Bentyl.  Please follow a low FODMAP diet. A handout regarding this was given to you today.  We have sent the following medications to your pharmacy for you to pick up at your convenience: Xifaxan 550 mg three times daily x 2 weeks  If you are age 77 or older, your body mass index should be between 23-30. Your There is no height or weight on file to calculate BMI. If this is out of the aforementioned range listed, please consider follow up with your Primary Care Provider.  If you are age 58 or younger, your body mass index should be between 19-25. Your There is no height or weight on file to calculate BMI. If this is out of the aformentioned range listed, please consider follow up with your Primary Care Provider.

## 2016-09-13 NOTE — Progress Notes (Signed)
HPI :  76 y/o female with a history of MAC / bronchiectasis, chronic back pain, TIA, referred by Dr. Annye Asa for bloating  She has been having problems with bloating and gas, and abdominal distension. She states she had a bad fall in June and hurt her back. These symptoms seemed to start since that occured. She initially had some periods of constipation which has not bothered her much lately. She has borboygmi with this. She feels at night she has a flat somtach, but had distension in her abdomen after she eats, abdomen swells up. No diarrhea. She is having 1-2 BMs per day or so. No blood in the stools. Having a bowel movement and passing gas makes her feel better. She has been given a trial of bentyl which has not helped, made her constipated. She has used Beano which has helped a little bit. She has used a probiotic - one Align, has not helped too much, and eating activia Yogurt. No new changes in her diet. No nausea or vomiting, eating okay otherwise. No pain. She is not taking fiber supplement, but eating a lot of fiber. She has not been on antibiotics recently. She does have a history of MAC. No weight loss.   No FH of celiac disease.   She has had an US of the abdomen done which looked okay.  She hs had MRI of her back x 2  Colonoscopy 01/29/2008 - diverticulosis  Past Medical History:  Diagnosis Date  . Acute left eye pain 11/15/2014  . Adverse effect of general anesthetic    DELERIUM  . ALLERGIC RHINITIS   . Anxiety   . Arthritis   . Bronchiectasis ([place])   . Cancer ([place])    basal cell skin cancer removed  . Chronic back pain   . Complication of anesthesia    s/p lung surgery at [facility] X 2 MONTHS  . Diverticulosis   . Dysphagia 10/13/2014  . Hydropneumothorax 11/15/2014  . Hypertension   . Hyponatremia 09/15/2014  . Internal hemorrhoids   . Irritable bowel syndrome with constipation   . Mycobacterium avium-intracellulare infection ([place])   .  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 Osteoporosis   . Stroke ([place])    TIA  SEVERAL YEARS AGO X 1 (75YRS -30 YRS AGO)  . Subcutaneous emphysema ([place]) 10/13/2014  . Toe pain 11/15/2014     Past Surgical History:  Procedure Laterality Date  . BREAST BIOPSY    . BREAST LUMPECTOMY WITH RADIOACTIVE SEED LOCALIZATION Left 10/05/2015   Procedure: LEFT BREAST LUMPECTOMY WITH RADIOACTIVE SEED LOCALIZATION;  Surgeon: Excell Seltzer, MD;  Location: [facility];  Service: General;  Laterality: Left;  . CATARACT EXTRACTION W/ INTRAOCULAR LENS  IMPLANT, BILATERAL    . CHOLECYSTECTOMY    . LAMINECTOMY     cervical   . VAGINAL HYSTERECTOMY     Family History  Problem Relation Age of Onset  . Breast cancer Mother   . Stroke Father   . Pulmonary embolism Brother    Social History  Substance Use Topics  . Smoking status: Never Smoker  . Smokeless tobacco: Never Used  . Alcohol use No   Current Outpatient Prescriptions  Medication Sig Dispense Refill  . Alpha-D-Galactosidase (BEANO PO) Take 1 tablet by mouth daily.    Marland Kitchen losartan (COZAAR) 50 MG tablet Take 1 tablet (50 mg total) by mouth daily. 90 tablet 3  .  rifaximin (XIFAXAN) 550 MG TABS tablet Take 1 tablet (550 mg total) by mouth 3 (three) times daily. 42 tablet 0  . umeclidinium-vilanterol (ANORO ELLIPTA) 62.5-25 MCG/INH AEPB Inhale 1 puff into the lungs daily. (Patient not taking: Reported on 09/13/2016) 90 each 1   No current facility-administered medications for this visit.    Allergies  Allergen Reactions  . Ethambutol Palpitations    Possible optic neuritis, possible gout attack  . Latex Rash  . Penicillins Hives    Breaks out in hives  . Prednisone Other (See Comments)     "out of mind" state  . Rifabutin Other (See Comments)    Loss of vision and anorexia  . Aspirin   . Betadine [Povidone Iodine]   . Codeine   . Iodinated Diagnostic Agents   . Macrolides And  Ketolides     Myocarditis from zithromax  . Morphine And Related   . Sulfa Antibiotics   . Azithromycin Other (See Comments)    Heart rhythm issues-prolonged QT wave     Review of Systems: All systems reviewed and negative except where noted in HPI.   Lab Results  Component Value Date   WBC 6.0 04/26/2016   HGB 13.7 04/26/2016   HCT 41.6 04/26/2016   MCV 92.1 04/26/2016   PLT 231.0 04/26/2016    Lab Results  Component Value Date   CREATININE 0.65 04/26/2016   BUN 10 04/26/2016   NA 135 04/26/2016   K 3.9 04/26/2016   CL 99 04/26/2016   CO2 30 04/26/2016    Lab Results  Component Value Date   ALT 19 04/26/2016   AST 25 04/26/2016   ALKPHOS 82 04/26/2016   BILITOT 0.5 04/26/2016     Physical Exam: BP 130/64   Pulse 68  Constitutional: Pleasant, thin appearing female in no acute distress. HEENT: Normocephalic and atraumatic. Conjunctivae are normal. No scleral icterus. Neck supple.  Cardiovascular: Normal rate, regular rhythm.  Pulmonary/chest: Effort normal and breath sounds normal. No wheezing, rales or rhonchi. Abdominal: Soft, nondistended, nontender.There are no masses palpable. No hepatomegaly. Extremities: no edema Lymphadenopathy: No cervical adenopathy noted. Neurological: Alert and oriented to person place and time. Skin: Skin is warm and dry. No rashes noted. Psychiatric: Normal mood and affect. Behavior is normal.   ASSESSMENT AND PLAN: 76 year old female with significant intermittent bloating and abdominal distention which has been bothering her for a few months now. Symptoms reliably relieved with a bowel movement. I discussed physiology of intestinal gas and bloating and potential causes with her and her husband. She has not had any benefit with probiotics and Bentyl up to this point, I think she should stop both of these.   We discussed dietary options to minimize the risk of bloating, and she will try a low FODMAP diet. I think this may help  her as she eats a lot of fruits and vegetables which potentially could be making this worse. I otherwise think trial of rifaximin may provide some benefit, we'll start her on rifaximin 550 mg 3 times a day for 2 weeks. She can continue to use Beano as this is helped, and she can try some over-the-counter simethicone as well. I will screen her for celiac to ensure negative as well.   If no improvement after these measures I asked her to contact me for reassessment. She agreed.   [place] Cellar, MD [facility] Pager [phone]  CC: Midge Minium, MD

## 2016-09-16 ENCOUNTER — Encounter: Payer: Self-pay | Admitting: Gastroenterology

## 2016-09-17 ENCOUNTER — Other Ambulatory Visit: Payer: Medicare Other

## 2016-09-17 DIAGNOSIS — R140 Abdominal distension (gaseous): Secondary | ICD-10-CM | POA: Diagnosis not present

## 2016-09-18 LAB — IGA: IGA/IMMUNOGLOBULIN A, SERUM: 244 mg/dL (ref 64–422)

## 2016-09-20 DIAGNOSIS — S32010A Wedge compression fracture of first lumbar vertebra, initial encounter for closed fracture: Secondary | ICD-10-CM | POA: Diagnosis not present

## 2016-09-24 ENCOUNTER — Encounter: Payer: Self-pay | Admitting: Gastroenterology

## 2016-10-01 ENCOUNTER — Ambulatory Visit (INDEPENDENT_AMBULATORY_CARE_PROVIDER_SITE_OTHER): Payer: Medicare Other | Admitting: Internal Medicine

## 2016-10-01 ENCOUNTER — Encounter: Payer: Self-pay | Admitting: Internal Medicine

## 2016-10-01 DIAGNOSIS — Z23 Encounter for immunization: Secondary | ICD-10-CM | POA: Diagnosis not present

## 2016-10-01 DIAGNOSIS — J479 Bronchiectasis, uncomplicated: Secondary | ICD-10-CM | POA: Diagnosis not present

## 2016-10-01 DIAGNOSIS — A310 Pulmonary mycobacterial infection: Secondary | ICD-10-CM

## 2016-10-01 NOTE — Patient Instructions (Addendum)
Flu vax- senior  Ok to try Mucinex otc to see if it will help you with the airway mucus.  Please call if we can help. I hope your back gets better quickly.

## 2016-10-01 NOTE — Progress Notes (Signed)
HPI female never smoker followed for cough, bronchiectasis/MAIC , obstructive airway disease, history of myocarditis. Cavitary lesion with history of hemoptysis resected/ RML/RLL lobectomy 09/2014 , right pleural effusion, left breast cancer/seeds  +MAIC/ triple therapy begun 06/18/11- dc'd 2015 by Cardiology due to myocarditis ? Due to zith?.   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 FVC 1.76/77%, FEV1 1.12/65%, ratio 0.63, FEF 25-75% 0.59/42%. -----------------------------------------------------------------------------------------  03/30/2016- 76 year old female never smoker followed for cough, bronchiectasis, obstructive airway disease , history of myocarditis Cavitary lesion with history of hemoptysis resected, RML/RLL lobectomy 09/2014 for bronchiectasis, right pleural effusion, left breast cancer/seeds/partial mastect. +MAIC/ triple therapy began 06/18/11- dc'd 2015 by Cardiology due to myocarditis ? Due to zith?.  FOLLOWS FOR: Pt states she is doing well but has noticed on exertion she tires easily and hard to breathe. Pt notes sinus drainage in throat. Has noticed more dyspnea on exertion cleaning house, climbing stairs, bending over. Occasional paroxysmal cough with only clear mucus. Denies night sweats or fever, blood or adenopathy. Has had some painful muscle spasms and shoulder/back being treated. Cardiology treated palpitation and HBP. Office Spirometry 03/30/2016-moderate airway obstruction. FVC 1.76/77%, FEV1 1.12/65%, ratio 0.63, FEF 25-75% 0.59/42%  10/01/16- . 76 year old female never smoker followed for cough, bronchiectasis, obstructive airway disease , history of myocarditis Cavitary lesion with history of hemoptysis resected, RML/RLL lobectomy 09/2014 for  bronchiectasis, right pleural effusion, left breast cancer/seeds/partial mastect. +MAIC/ triple therapy began 06/18/11- dc'd 2015 by Cardiology due to myocarditis ? Due to zith?. Pt fell June 2018 and is still having problems from that. States that her breathing is okay; c/o prod. cough with white to clear mucus and feels bloated in abdomen putting a lot of pressure. SOB on exertion. Back pain after fall but walks okay. Coughs about one time a day-clear white sputum. Asks about flying over the holidays-discussed altitude, effect on blood oxygen level, exercise tolerance. CXR 03/30/16 IMPRESSION: Stable chest x-ray. No acute findings. Postsurgical changes of the right lung. Stable hyperexpansion of the left lung suggesting COPD  ROS- see HPI    + = pos Constitutional:   No-   weight loss, night sweats, fevers, chills, fatigue, lassitude. HEENT:   No-  headaches, difficulty swallowing, tooth/dental problems, sore throat,       No-  sneezing, itching, ear ache, nasal congestion, + post nasal drip,  CV:  No-   chest pain, orthopnea, PND, swelling in lower extremities, anasarca, dizziness, palpitations Resp: +  shortness of breath with exertion or at rest.              +  productive cough,  little non-productive cough,  No recent coughing up of blood.              No-   change in color of mucus.  No- wheezing.   Skin: No-   rash or lesions. GI:  No-   heartburn, indigestion, abdominal pain, nausea, vomiting,  GU:  MS:  No-   joint pain or swelling.   Neuro-     nothing unusual Psych:  No- change in mood or affect. No depression or anxiety.  No memory loss.  OBJ- Physical Exam  General- Alert, Oriented, Affect-appropriate, Distress- none acute, +slender, talkative Skin- rash-none, lesions- none, excoriation- none Lymphadenopathy- none Head- atraumatic            Eyes- Gross vision intact, PERRLA,  conjunctivae and secretions clear            Ears- Hearing aides            Nose- Clear,  no-Septal dev, mucus, polyps, erosion, perforation             Throat- Mallampati II , mucosa clear , drainage- none, tonsils- atrophic Neck- flexible , trachea midline, no stridor , thyroid nl, carotid no bruit Chest - symmetrical excursion , unlabored           Heart/CV- +bigeminal pulse , no murmur , no gallop  , no rub, nl s1 s2                           - JVD- none , edema- none, stasis changes- none, varices- none           Lung- + trace squeak left base, without wheeze or rhonchi, cough- none , dullness+ right base, rub- none           Chest wall-  Abd-  Br/ Gen/ Rectal- Not done, not indicated Extrem- cyanosis- none, clubbing, none, atrophy- none, strength- nl Neuro- grossly intact to observation

## 2016-10-02 ENCOUNTER — Telehealth: Payer: Self-pay | Admitting: *Deleted

## 2016-10-02 NOTE — Telephone Encounter (Signed)
Patient called back. She states that she actually decided to hold off on taking the xifaxan for now due to other medical issues she is having. States she has had some back pain that she thinks is relating to some of her GI issues. She also is reluctant to take any medications due to her multiple medication allergies and hx of heart condition which she attributes to medication allergy etc. She will call us back or mychart [place] if she chooses to take xifaxan or have further work up.

## 2016-10-02 NOTE — Telephone Encounter (Signed)
Okay thanks for letting me know

## 2016-10-02 NOTE — Telephone Encounter (Signed)
I have left a voicemail for patient to call back. Her insurance would not cover the xifaxan at an affordable cost. I have obtained 42 capsules (full course) of xifaxan for her to pick up.

## 2016-10-03 ENCOUNTER — Encounter: Payer: Self-pay | Admitting: Cardiovascular Disease

## 2016-10-05 ENCOUNTER — Telehealth: Payer: Self-pay | Admitting: Internal Medicine

## 2016-10-05 MED ORDER — DOXYCYCLINE HYCLATE 100 MG PO TABS: ORAL_TABLET | ORAL | 0 refills | Status: DC

## 2016-10-05 NOTE — Telephone Encounter (Signed)
Offer doxycycline 100 mg, # 8, 2 today then one daily  Ok to use otc cough med like Delsym, if she doesn't have a cough syrup at home.   Use tylenol instead of aspirin if pain med needed  Let us know if bleeding continues through the weekend.

## 2016-10-05 NOTE — Telephone Encounter (Signed)
Called and spoke with pt. Pt states she spit up two tbsp full of bright bright blood mixed with clear mucus last night.  Pt denies any other symptoms.    CY please advise.  Thanks.   Current Outpatient Prescriptions on File Prior to Visit  Medication Sig Dispense Refill  . losartan (COZAAR) 50 MG tablet Take 1 tablet (50 mg total) by mouth daily. 90 tablet 3   No current facility-administered medications on file prior to visit.     Allergies  Allergen Reactions  . Ethambutol Palpitations    Possible optic neuritis, possible gout attack  . Latex Rash  . Penicillins Hives    Breaks out in hives  . Prednisone Other (See Comments)     "out of mind" state  . Rifabutin Other (See Comments)    Loss of vision and anorexia  . Aspirin   . Betadine [Povidone Iodine]   . Codeine   . Iodinated Diagnostic Agents   . 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 Morphine And Related   . Sulfa Antibiotics   . Azithromycin Other (See Comments)    Heart rhythm issues-prolonged QT wave

## 2016-10-05 NOTE — Telephone Encounter (Signed)
Pt is aware of CY's recommendations and voiced her understanding. Rx has been sent to preferred pharmacy. Nothing further needed.  

## 2016-10-10 LAB — TISSUE TRANSGLUTAMINASE, IGA: Transglutaminase IgA: <2 U/mL (ref 0–3)

## 2016-10-10 LAB — SPECIMEN STATUS REPORT

## 2016-10-17 DIAGNOSIS — M542 Cervicalgia: Secondary | ICD-10-CM | POA: Diagnosis not present

## 2016-10-18 ENCOUNTER — Encounter: Payer: Self-pay | Admitting: Gastroenterology

## 2016-10-18 ENCOUNTER — Other Ambulatory Visit: Payer: Self-pay

## 2016-10-18 DIAGNOSIS — R140 Abdominal distension (gaseous): Secondary | ICD-10-CM

## 2016-10-22 NOTE — Assessment & Plan Note (Signed)
Chronic productive cough but not purulent and no recent infection. Recent fall shook her up and hurt her back so she's been gardening more and realizes that makes it harder to cough. Plan-pain management as needed. She is following up with PCP. Suggested Mucinex.

## 2016-10-22 NOTE — Assessment & Plan Note (Signed)
We are not seeing obvious progressive damage to suggest active infection. Plan-continue surveillance

## 2016-10-24 ENCOUNTER — Telehealth: Payer: Self-pay | Admitting: Internal Medicine

## 2016-10-24 NOTE — Telephone Encounter (Signed)
Spoke with patient. She stated that the cough has returned and she is concerned since she has not had any xrays or CT scans. Offered her a same day appointment several times with either TP or [facility] since they have openings. She only wanted to see CY. Advised that his next appt wouldn't be until 11/01 at 1130. She wanted that appt. She has been scheduled with CY at 11/01 at 1130.   Nothing else needed at time of call.

## 2016-10-26 ENCOUNTER — Telehealth: Payer: Self-pay | Admitting: Internal Medicine

## 2016-10-26 MED ORDER — DOXYCYCLINE HYCLATE 100 MG PO TABS: ORAL_TABLET | ORAL | 0 refills | Status: DC

## 2016-10-26 NOTE — Telephone Encounter (Signed)
Suggest doxycycline 100 mg, # 14, 1 twice daily  It would be ok for her to use otc Delsym cough syrup. It will calm her cough a little, without preventing her from  clearing her airways.

## 2016-10-26 NOTE — Telephone Encounter (Signed)
Spoke with patient. She is aware of CY's recs. Will go ahead and call in RX to [facility] in [place]. She verbalized understanding.   Nothing else neeeded at time of call.

## 2016-10-26 NOTE — Telephone Encounter (Signed)
Spoke with pt, she is getting worse with coughing with some blood and yellow mucus. She states she was on Doxy 2 weeks ago and this helped with the cough and mucus. She is having two MRI next Friday and she is concerned about not feeling well. She is trying to cough up everything and afraid to take any medications that will prevent her to cough but her cough spells last at least an hour. CY please advise.   Current Outpatient Prescriptions on File Prior to Visit  Medication Sig Dispense Refill  . doxycycline (VIBRA-TABS) 100 MG tablet 2 tabs first day followed by 1 tab daily until finished. 8 tablet 0  . losartan (COZAAR) 50 MG tablet Take 1 tablet (50 mg total) by mouth daily. 90 tablet 3   No current facility-administered medications on file prior to visit.    Allergies  Allergen Reactions  . Ethambutol Palpitations    Possible optic neuritis, possible gout attack  . Latex Rash  . Penicillins Hives    Breaks out in hives  . Prednisone Other (See Comments)     "out of mind" state  . Rifabutin Other (See Comments)    Loss of vision and anorexia  . Aspirin   . Betadine [Povidone Iodine]   . Codeine   . Iodinated Diagnostic Agents   . 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 Morphine And Related   . Sulfa Antibiotics   . Azithromycin Other (See Comments)    Heart rhythm issues-prolonged QT wave

## 2016-10-27 ENCOUNTER — Encounter: Payer: Self-pay | Admitting: Cardiovascular Disease

## 2016-10-30 ENCOUNTER — Encounter: Payer: Self-pay | Admitting: *Deleted

## 2016-10-30 NOTE — Telephone Encounter (Signed)
This encounter was created in error - please disregard.

## 2016-10-31 MED ORDER — IRBESARTAN 150 MG PO TABS: 150.0000 mg | ORAL_TABLET | Freq: Every day | ORAL | 1 refills | Status: DC

## 2016-11-01 ENCOUNTER — Ambulatory Visit (INDEPENDENT_AMBULATORY_CARE_PROVIDER_SITE_OTHER): Payer: Medicare Other | Admitting: Family Medicine

## 2016-11-01 ENCOUNTER — Encounter: Payer: Self-pay | Admitting: Family Medicine

## 2016-11-01 ENCOUNTER — Encounter: Payer: Self-pay | Admitting: General Practice

## 2016-11-01 VITALS — BP 130/80 | HR 78 | Temp 98.0°F | Resp 16 | Ht 60.0 in | Wt 97.0 lb

## 2016-11-01 DIAGNOSIS — I10 Essential (primary) hypertension: Secondary | ICD-10-CM

## 2016-11-01 LAB — HEPATIC FUNCTION PANEL
ALT: 20 U/L (ref 0–35)
AST: 29 U/L (ref 0–37)
Albumin: 4.4 g/dL (ref 3.5–5.2)
Alkaline Phosphatase: 83 U/L (ref 39–117)
Bilirubin, Direct: 0.1 mg/dL (ref 0.0–0.3)
TOTAL PROTEIN: 7.6 g/dL (ref 6.0–8.3)
Total Bilirubin: 0.6 mg/dL (ref 0.2–1.2)

## 2016-11-01 LAB — LIPID PANEL
Cholesterol: 187 mg/dL (ref 0–200)
HDL: 81 mg/dL (ref >39.00)
LDL Cholesterol: 96 mg/dL (ref 0–99)
NONHDL: 106.38
TRIGLYCERIDES: 52 mg/dL (ref 0.0–149.0)
Total CHOL/HDL Ratio: 2
VLDL: 10.4 mg/dL (ref 0.0–40.0)

## 2016-11-01 LAB — CBC WITH DIFFERENTIAL/PLATELET
BASOS ABS: 0.1 10*3/uL (ref 0.0–0.1)
Basophils Relative: 0.9 % (ref 0.0–3.0)
EOS ABS: 0.3 10*3/uL (ref 0.0–0.7)
Eosinophils Relative: 4 % (ref 0.0–5.0)
HCT: 41.8 % (ref 36.0–46.0)
Hemoglobin: 14 g/dL (ref 12.0–15.0)
LYMPHS ABS: 1.4 10*3/uL (ref 0.7–4.0)
LYMPHS PCT: 22.2 % (ref 12.0–46.0)
MCHC: 33.4 g/dL (ref 30.0–36.0)
MCV: 92.8 fl (ref 78.0–100.0)
MONO ABS: 0.7 10*3/uL (ref 0.1–1.0)
Monocytes Relative: 11.7 % (ref 3.0–12.0)
NEUTROS PCT: 61.2 % (ref 43.0–77.0)
Neutro Abs: 3.8 10*3/uL (ref 1.4–7.7)
Platelets: 291 10*3/uL (ref 150.0–400.0)
RBC: 4.51 Mil/uL (ref 3.87–5.11)
RDW: 13.8 % (ref 11.5–15.5)
WBC: 6.3 10*3/uL (ref 4.0–10.5)

## 2016-11-01 LAB — BASIC METABOLIC PANEL
BUN: 12 mg/dL (ref 6–23)
CALCIUM: 9.8 mg/dL (ref 8.4–10.5)
CO2: 33 meq/L — AB (ref 19–32)
CREATININE: 0.52 mg/dL (ref 0.40–1.20)
Chloride: 95 mEq/L — ABNORMAL LOW (ref 96–112)
GFR: 121.53 mL/min (ref >60.00)
GLUCOSE: 94 mg/dL (ref 70–99)
Potassium: 4.5 mEq/L (ref 3.5–5.1)
Sodium: 134 mEq/L — ABNORMAL LOW (ref 135–145)

## 2016-11-01 LAB — TSH: TSH: 1.41 u[IU]/mL (ref 0.35–4.50)

## 2016-11-01 NOTE — Patient Instructions (Signed)
Follow up in 6 months to recheck BP We'll notify you of your lab results and make any changes if needed Keep up the good work!  You look good! Call with any questions or concerns Happy [birth year] Birthday!!

## 2016-11-01 NOTE — Assessment & Plan Note (Signed)
Chronic problem.  Well controlled.  Asymptomatic today.  Check labs.  No anticipated med changes.

## 2016-11-01 NOTE — Progress Notes (Signed)
   Subjective:    Patient ID: Mia Hernandez, female    DOB: July 19, 1940, 76 y.o.   MRN: [id number]  HPI HTN- chronic problem.  On Irbesartan 150mg  daily w/ adequate control.  Denies CP, + SOB due to bronchiectasis but this is at baseline.  Denies HAs, visual changes, edema.   Review of Systems For ROS see HPI     Objective:   Physical Exam  Constitutional: She is oriented to person, place, and time. She appears well-developed and well-nourished. No distress.  HENT:  Head: Normocephalic and atraumatic.  Eyes: Pupils are equal, round, and reactive to light. Conjunctivae and EOM are normal.  Neck: Normal range of motion. Neck supple. No thyromegaly present.  Cardiovascular: Normal rate, regular rhythm, normal heart sounds and intact distal pulses.   No murmur heard. Pulmonary/Chest: Effort normal and breath sounds normal. No respiratory distress.  Abdominal: Soft. She exhibits no distension. There is no tenderness.  Musculoskeletal: She exhibits no edema.  Lymphadenopathy:    She has no cervical adenopathy.  Neurological: She is alert and oriented to person, place, and time.  Skin: Skin is warm and dry.  Psychiatric: She has a normal mood and affect. Her behavior is normal.  Vitals reviewed.         Assessment & Plan:

## 2016-11-02 ENCOUNTER — Ambulatory Visit
Admission: RE | Admit: 2016-11-02 | Discharge: 2016-11-02 | Disposition: A | Discharge status: Home / Self Care | Payer: Medicare Other | Source: Ambulatory Visit | Attending: Gastroenterology | Admitting: Gastroenterology

## 2016-11-02 DIAGNOSIS — K5900 Constipation, unspecified: Secondary | ICD-10-CM | POA: Diagnosis not present

## 2016-11-02 DIAGNOSIS — R140 Abdominal distension (gaseous): Secondary | ICD-10-CM

## 2016-11-02 MED ORDER — GADOBENATE DIMEGLUMINE 529 MG/ML IV SOLN
10.0000 mL | Freq: Once | INTRAVENOUS | Status: AC | PRN
  Administered 2016-11-02: 10 mL via INTRAVENOUS

## 2016-11-04 ENCOUNTER — Encounter: Payer: Self-pay | Admitting: Cardiovascular Disease

## 2016-11-05 ENCOUNTER — Telehealth: Payer: Self-pay | Admitting: Cardiovascular Disease

## 2016-11-05 NOTE — Telephone Encounter (Signed)
Returned call to patient Dr.Croitoru's recommendations given.She stated her B/P has been good.Advised to try Irbesartan 75 mg daily and call back if she continues to have dizziness.

## 2016-11-05 NOTE — Telephone Encounter (Signed)
Returned call to patient.She stated she cannot take Irbesartan,causes severe dizziness.Advised I will send message to Dr.Croitoru for advice.

## 2016-11-05 NOTE — Telephone Encounter (Signed)
New message     Patient states she may be having a reaction to medication. States medication made her feel extremely dizzy. Please call   Pt c/o medication issue:  1. Name of Medication: irbesartan (AVAPRO) 150 MG tablet  2. How are you currently taking this medication (dosage and times per day)? Take 1 tablet (150 mg total) by mouth daily  3. Are you having a reaction (difficulty breathing--STAT)? NO  4. What is your medication issue? After taking medication, felt dizzy. Could not walk.

## 2016-11-05 NOTE — Telephone Encounter (Signed)
It may just be that the dose is too high. Did dhe check BP? Can try taking half the tablet, 75 mg once daily. MCr

## 2016-11-07 ENCOUNTER — Encounter: Payer: Self-pay | Admitting: Gastroenterology

## 2016-11-07 DIAGNOSIS — S32010A Wedge compression fracture of first lumbar vertebra, initial encounter for closed fracture: Secondary | ICD-10-CM | POA: Diagnosis not present

## 2016-11-07 DIAGNOSIS — I10 Essential (primary) hypertension: Secondary | ICD-10-CM | POA: Diagnosis not present

## 2016-11-08 ENCOUNTER — Ambulatory Visit (INDEPENDENT_AMBULATORY_CARE_PROVIDER_SITE_OTHER)
Admission: RE | Admit: 2016-11-08 | Discharge: 2016-11-08 | Disposition: A | Discharge status: Home / Self Care | Payer: Medicare Other | Source: Ambulatory Visit | Attending: Internal Medicine | Admitting: Internal Medicine

## 2016-11-08 ENCOUNTER — Telehealth: Payer: Self-pay

## 2016-11-08 ENCOUNTER — Ambulatory Visit (INDEPENDENT_AMBULATORY_CARE_PROVIDER_SITE_OTHER): Payer: Medicare Other | Admitting: Internal Medicine

## 2016-11-08 ENCOUNTER — Encounter: Payer: Self-pay | Admitting: Internal Medicine

## 2016-11-08 VITALS — BP 132/72 | HR 77 | Ht 60.0 in | Wt 96.8 lb

## 2016-11-08 DIAGNOSIS — J471 Bronchiectasis with (acute) exacerbation: Secondary | ICD-10-CM | POA: Diagnosis not present

## 2016-11-08 DIAGNOSIS — K581 Irritable bowel syndrome with constipation: Secondary | ICD-10-CM | POA: Diagnosis not present

## 2016-11-08 DIAGNOSIS — J90 Pleural effusion, not elsewhere classified: Secondary | ICD-10-CM | POA: Diagnosis not present

## 2016-11-08 MED ORDER — BENZONATATE 200 MG PO CAPS: 200.0000 mg | ORAL_CAPSULE | Freq: Three times a day (TID) | ORAL | 1 refills | Status: DC | PRN

## 2016-11-08 NOTE — Telephone Encounter (Signed)
CY please review CT results. Radiology called for a call report on pt.

## 2016-11-08 NOTE — Patient Instructions (Addendum)
Sample if available Incruse Ellipta     Inhale 1 puff, once daily  Order- CXR    Dx bronchiectasis with exacerbation  Script for benzonatate perles for cough as needed

## 2016-11-08 NOTE — Progress Notes (Signed)
HPI female never smoker followed for cough, bronchiectasis/MAIC , obstructive airway disease, history of myocarditis. Cavitary lesion with history of hemoptysis resected/ RML/RLL lobectomy 09/2014 , right pleural effusion, left breast cancer/seeds  +MAIC/ triple therapy begun 06/18/11- dc'd 2015 by Cardiology due to myocarditis ? Due to zith?.   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 FVC 1.76/77%, FEV1 1.12/65%, ratio 0.63, FEF 25-75% 0.59/42%. -----------------------------------------------------------------------------------------  10/01/16- . 76 year old female never smoker followed for cough, bronchiectasis, obstructive airway disease , history of myocarditis Cavitary lesion with history of hemoptysis resected, RML/RLL lobectomy 09/2014 for bronchiectasis, right pleural effusion, left breast cancer/seeds/partial mastect. +MAIC/ triple therapy began 06/18/11- dc'd 2015 by Cardiology due to myocarditis ? Due to zith?. Pt fell June 2018 and is still having problems from that. States that her breathing is okay; c/o prod. cough with white to clear mucus and feels bloated in abdomen putting a lot of pressure. SOB on exertion. Back pain after fall but walks okay. Coughs about one time a day-clear white sputum. Asks about flying over the holidays-discussed altitude, effect on blood oxygen level, exercise tolerance. CXR 03/30/16 IMPRESSION: Stable chest x-ray. No acute findings. Postsurgical changes of the right lung. Stable hyperexpansion of the left lung suggesting COPD  11/08/16- 76 year old female never smoker followed for cough, bronchiectasis, obstructive airway disease , history of myocarditis Cavitary lesion with history of hemoptysis resected, RML/RLL lobectomy 09/2014 for  bronchiectasis, right pleural effusion, left breast cancer/seeds/partial mastect. +MAIC/ triple therapy began 06/18/11- dc'd 2015 by Cardiology due to myocarditis ? Due to zith?. ---Bronchiectasis; Pt is having a productive cough again-yellow in color but usually in the mornings. Has specks of blood in it as well. Pt has noticed her breathing is not as good as before; denies any chest tightness or pain.  She is mainly bothered recently by abdominal bloating and constipation which are being worked up elsewhere. Has had some increased cough in the last month or so with weight or trace yellow sputum rare speck of blood last noted several weeks ago.  Doxycycline cleared her for a while after 2 rounds ending 1 week ago. We compared images and discussed chest x-rays from 3/18 and from 2017. Anoro overstimulated her but did help her breathing.  Has not been using her rescue inhaler.  ROS- see HPI    + = pos Constitutional:   No-   weight loss, night sweats, fevers, chills, fatigue, lassitude. HEENT:   No-  headaches, difficulty swallowing, tooth/dental problems, sore throat,       No-  sneezing, itching, ear ache, nasal congestion, + post nasal drip,  CV:  No-   chest pain, orthopnea, PND, swelling in lower extremities, anasarca, dizziness, palpitations Resp: +  shortness of breath with exertion or at rest.              +  productive cough,  little non-productive cough,  No recent coughing up of blood.              No-   change in color of mucus.  No- wheezing.   Skin: No-   rash or lesions. GI:  No-   heartburn, indigestion, abdominal pain, nausea, vomiting,  GU:  MS:  No-   joint pain or swelling.   Neuro-     nothing unusual Psych:  No- change in mood or affect. No depression or anxiety.  No  memory loss.  OBJ- Physical Exam General- Alert, Oriented, Affect-appropriate, Distress- none acute, +slender, talkative Skin- rash-none, lesions- none, excoriation- none Lymphadenopathy- none Head-  atraumatic            Eyes- Gross vision intact, PERRLA, conjunctivae and secretions clear            Ears- Hearing aides            Nose- Clear, no-Septal dev, mucus, polyps, erosion, perforation             Throat- Mallampati II , mucosa clear , drainage- none, tonsils- atrophic Neck- flexible , trachea midline, no stridor , thyroid nl, carotid no bruit Chest - symmetrical excursion , unlabored           Heart/CV- +bigeminal pulse , no murmur , no gallop  , no rub, nl s1 s2                           - JVD- none , edema- none, stasis changes- none, varices- none           Lung- + trace squeaks, without wheeze or rhonchi, cough- none , dullness+ right base, rub- none           Chest wall-  Abd-  Br/ Gen/ Rectal- Not done, not indicated Extrem- cyanosis- none, clubbing, none, atrophy- none, strength- nl Neuro- grossly intact to observation

## 2016-11-09 NOTE — Assessment & Plan Note (Signed)
Minimal exacerbation coincident with season change.  I do not think she is dealing with a viral URI.  She does not do well with sympathetic stimulant bronchodilators but we will see how she can do with a LAMA. Plan-CXR,Incruse

## 2016-11-09 NOTE — Assessment & Plan Note (Signed)
Bloating sensation contributes to her sense of breathing discomfort.  She is working with GI. Plan-I suggested she might try simethicone

## 2016-11-12 ENCOUNTER — Encounter: Payer: Self-pay | Admitting: Cardiovascular Disease

## 2016-11-12 ENCOUNTER — Telehealth: Payer: Self-pay | Admitting: *Deleted

## 2016-11-12 ENCOUNTER — Telehealth: Payer: Self-pay | Admitting: Cardiovascular Disease

## 2016-11-12 ENCOUNTER — Other Ambulatory Visit: Payer: Self-pay | Admitting: *Deleted

## 2016-11-12 DIAGNOSIS — R042 Hemoptysis: Secondary | ICD-10-CM

## 2016-11-12 DIAGNOSIS — J471 Bronchiectasis with (acute) exacerbation: Secondary | ICD-10-CM

## 2016-11-12 MED ORDER — IRBESARTAN 150 MG PO TABS: ORAL_TABLET | ORAL | 1 refills | Status: DC

## 2016-11-12 NOTE — Telephone Encounter (Signed)
Pt is aware of results and order placed.  

## 2016-11-12 NOTE — Telephone Encounter (Signed)
New message  Patient requesting Irbesartan 75mg     *STAT* If patient is at the pharmacy, call can be transferred to refill team.   1. Which medications need to be refilled? (please list name of each medication and dose if known) irbesartan   2. Which pharmacy/location (including street and city if local pharmacy) is medication to be sent to? Mail order [facility]  3. Do they need a 30 day or 90 day supply? [facility]

## 2016-11-15 DIAGNOSIS — H6123 Impacted cerumen, bilateral: Secondary | ICD-10-CM | POA: Diagnosis not present

## 2016-11-15 DIAGNOSIS — Z974 Presence of external hearing-aid: Secondary | ICD-10-CM | POA: Diagnosis not present

## 2016-11-15 DIAGNOSIS — H903 Sensorineural hearing loss, bilateral: Secondary | ICD-10-CM | POA: Diagnosis not present

## 2016-11-19 ENCOUNTER — Other Ambulatory Visit: Payer: Medicare Other

## 2016-11-19 ENCOUNTER — Encounter: Payer: Self-pay | Admitting: Cardiovascular Disease

## 2016-11-22 ENCOUNTER — Other Ambulatory Visit: Payer: Medicare Other

## 2016-11-26 ENCOUNTER — Other Ambulatory Visit: Payer: Self-pay

## 2016-11-26 MED ORDER — IRBESARTAN 150 MG PO TABS: 75.0000 mg | ORAL_TABLET | Freq: Two times a day (BID) | ORAL | 2 refills | Status: DC

## 2016-11-26 NOTE — Telephone Encounter (Signed)
Rx(s) sent to pharmacy electronically.  

## 2016-11-28 ENCOUNTER — Ambulatory Visit (INDEPENDENT_AMBULATORY_CARE_PROVIDER_SITE_OTHER)
Admission: RE | Admit: 2016-11-28 | Discharge: 2016-11-28 | Disposition: A | Discharge status: Home / Self Care | Payer: Medicare Other | Source: Ambulatory Visit | Attending: Internal Medicine | Admitting: Internal Medicine

## 2016-11-28 DIAGNOSIS — J479 Bronchiectasis, uncomplicated: Secondary | ICD-10-CM | POA: Diagnosis not present

## 2016-11-28 DIAGNOSIS — J471 Bronchiectasis with (acute) exacerbation: Secondary | ICD-10-CM | POA: Diagnosis not present

## 2016-11-28 DIAGNOSIS — R042 Hemoptysis: Secondary | ICD-10-CM | POA: Diagnosis not present

## 2016-12-03 ENCOUNTER — Encounter: Payer: Self-pay | Admitting: Gastroenterology

## 2016-12-05 ENCOUNTER — Other Ambulatory Visit: Payer: Self-pay

## 2016-12-05 MED ORDER — PEPPERMINT OIL OIL: TOPICAL_OIL | 0 refills | Status: DC

## 2016-12-11 ENCOUNTER — Other Ambulatory Visit: Payer: Self-pay | Admitting: Internal Medicine

## 2016-12-11 DIAGNOSIS — J479 Bronchiectasis, uncomplicated: Secondary | ICD-10-CM

## 2016-12-25 ENCOUNTER — Other Ambulatory Visit: Payer: Medicare Other

## 2016-12-25 DIAGNOSIS — J479 Bronchiectasis, uncomplicated: Secondary | ICD-10-CM | POA: Diagnosis not present

## 2016-12-25 NOTE — Addendum Note (Signed)
Addended by: Isaiah Serge D on: 12/25/2016 12:32 PM   Modules accepted: Orders

## 2016-12-28 ENCOUNTER — Telehealth: Payer: Self-pay | Admitting: Internal Medicine

## 2016-12-28 MED ORDER — FLUTICASONE-UMECLIDIN-VILANT 100-62.5-25 MCG/INH IN AEPB: 1.0000 | INHALATION_SPRAY | Freq: Every day | RESPIRATORY_TRACT | 0 refills | Status: DC

## 2016-12-28 MED ORDER — DOXYCYCLINE HYCLATE 100 MG PO TABS: 100.0000 mg | ORAL_TABLET | Freq: Two times a day (BID) | ORAL | 0 refills | Status: DC

## 2016-12-28 NOTE — Telephone Encounter (Signed)
Ok to Rx doxycycline 100 mg, # 14,  1 twice daily  Ok to try a sample of Trelegy  Inhale 1 puff, once daily, then rinse mouth, instead of Anoro, as requested. When she finishes sample, go back to Anoro for comparison.

## 2016-12-28 NOTE — Telephone Encounter (Signed)
Spoke with pt, she states she turned in a sputum specimen for a culture and there was no blood seen in her mucus. She started spitting up mucus with blood in the sputum every time she coughed and it has continued. She states she was told to call if this happens and wants to know if the Anoro can cause this? She wanted to inquire about Trelegy being a better option for her treatmenrt. CY please advise. Doxy is usually prescribed per pt.  [facility] [address]  Current Outpatient Medications on File Prior to Visit  Medication Sig Dispense Refill  . benzonatate (TESSALON) 200 MG capsule Take 1 capsule (200 mg total) by mouth 3 (three) times daily as needed for cough. 30 capsule 1  . irbesartan (AVAPRO) 150 MG tablet Take 0.5 tablets (75 mg total) 2 (two) times daily by mouth. 90 tablet 2  . peppermint oil liquid Use as directed on package. 8 mL 0  . polyethylene glycol (MIRALAX / GLYCOLAX) packet Take 17 g by mouth daily.     No current facility-administered medications on file prior to visit.    Allergies  Allergen Reactions  . Ethambutol Palpitations    Possible optic neuritis, possible gout attack  . Latex Rash  . Penicillins Hives    Breaks out in hives  . Prednisone Other (See Comments)     "out of mind" state  . Rifabutin Other (See Comments)    Loss of vision and anorexia  . Aspirin   . Betadine [Povidone Iodine]   . Codeine   . Iodinated Diagnostic Agents   . 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 Morphine And Related   . Sulfa Antibiotics   . Azithromycin Other (See Comments)    Heart rhythm issues-prolonged QT wave

## 2016-12-28 NOTE — Telephone Encounter (Signed)
Spoke with pt. She is aware of CY's recommendations. Rx has been sent in for Doxy. Sample has been left up front for pick up. Nothing further was needed.

## 2017-01-04 ENCOUNTER — Telehealth: Payer: Self-pay | Admitting: Internal Medicine

## 2017-01-04 ENCOUNTER — Ambulatory Visit (INDEPENDENT_AMBULATORY_CARE_PROVIDER_SITE_OTHER)
Admission: RE | Admit: 2017-01-04 | Discharge: 2017-01-04 | Disposition: A | Discharge status: Home / Self Care | Payer: Medicare Other | Source: Ambulatory Visit | Attending: Internal Medicine | Admitting: Internal Medicine

## 2017-01-04 DIAGNOSIS — J479 Bronchiectasis, uncomplicated: Secondary | ICD-10-CM | POA: Diagnosis not present

## 2017-01-04 DIAGNOSIS — J90 Pleural effusion, not elsewhere classified: Secondary | ICD-10-CM | POA: Diagnosis not present

## 2017-01-04 LAB — AFB CULTURE WITH SMEAR (NOT AT ARMC)
ACID FAST SMEAR: NEGATIVE
Acid Fast Culture: POSITIVE — AB

## 2017-01-04 LAB — AFB ID BY DNA PROBE
M AVIUM COMPLEX: POSITIVE — AB
M TUBERCULOSIS COMPLEX: NEGATIVE

## 2017-01-04 MED ORDER — DOXYCYCLINE HYCLATE 100 MG PO TABS
100.0000 mg | ORAL_TABLET | Freq: Two times a day (BID) | ORAL | 0 refills | Status: DC
Start: 2017-01-04 — End: 2017-01-14

## 2017-01-04 NOTE — Telephone Encounter (Signed)
Received call report from [facility] for positive AFB with ID to follow.  AFB was also ordered today, please advise if order should be d/c.  CY please advise. Thanks

## 2017-01-04 NOTE — Addendum Note (Signed)
Addended by: Lorane Gell on: 01/04/2017 03:54 PM   Modules accepted: Orders

## 2017-01-04 NOTE — Telephone Encounter (Signed)
Mia Hernandez- Her CXR shows an area of increasing density in the right lung, possibly a cavity filling with fluid  I would like to have her take a longer course of doxycycline 100 mg, # 20, 1 twice daily.  Also- please see if we csan work her in with me in about 2 - 3 weeks

## 2017-01-04 NOTE — Telephone Encounter (Signed)
Per CY verbally- pt does not need repeat AFB.  AFB has been canceled.

## 2017-01-04 NOTE — Telephone Encounter (Signed)
Pt is aware of results and Rx sent to pharmacy ([facility] per patient) Pt will be seen by CY on Friday 01-19-16 at 11:30am. Nothing more needed at this time.

## 2017-01-04 NOTE — Telephone Encounter (Signed)
Pt is aware of below message and voiced her understanding. cxr and sputum cultures have been ordered. Nothing further is needed.

## 2017-01-04 NOTE — Telephone Encounter (Signed)
Called and spoke with pt. Pt was prescribed doxy on 12/28/16 for cough with clear mucus mixed with blood. Pt states she completed doxy on 01/04/17. Pt states symptoms have improved, however she is still producing some clear mucus mixed with blood occ.  Pt is requesting further recommendations.  CY please advise. Thanks.   Current Outpatient Medications on File Prior to Visit  Medication Sig Dispense Refill  . benzonatate (TESSALON) 200 MG capsule Take 1 capsule (200 mg total) by mouth 3 (three) times daily as needed for cough. 30 capsule 1  . doxycycline (VIBRA-TABS) 100 MG tablet Take 1 tablet (100 mg total) by mouth 2 (two) times daily. 14 tablet 0  . Fluticasone-Umeclidin-Vilant (TRELEGY ELLIPTA) 100-62.5-25 MCG/INH AEPB Inhale 1 puff into the lungs daily. 14 each 0  . irbesartan (AVAPRO) 150 MG tablet Take 0.5 tablets (75 mg total) 2 (two) times daily by mouth. 90 tablet 2  . peppermint oil liquid Use as directed on package. 8 mL 0  . polyethylene glycol (MIRALAX / GLYCOLAX) packet Take 17 g by mouth daily.     No current facility-administered medications on file prior to visit.     Allergies  Allergen Reactions  . Ethambutol Palpitations    Possible optic neuritis, possible gout attack  . Latex Rash  . Penicillins Hives    Breaks out in hives  . Prednisone Other (See Comments)     "out of mind" state  . Rifabutin Other (See Comments)    Loss of vision and anorexia  . Aspirin   . Betadine [Povidone Iodine]   . Codeine   . Iodinated Diagnostic Agents   . 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 Morphine And Related   . Sulfa Antibiotics   . Azithromycin Other (See Comments)    Heart rhythm issues-prolonged QT wave

## 2017-01-04 NOTE — Telephone Encounter (Signed)
Could she come by for outpatient CXR-  Dx bronchiectasis, hemoptysis                                       And lab- Sputum smear and culture:   Routine, fungal and AFB

## 2017-01-10 ENCOUNTER — Telehealth: Payer: Self-pay | Admitting: Internal Medicine

## 2017-01-10 NOTE — Telephone Encounter (Signed)
Pt is aware of below recommendations and voiced her understanding. nothing further [place] needed.

## 2017-01-10 NOTE — Telephone Encounter (Signed)
lmtcb x1 for pt. 

## 2017-01-10 NOTE — Telephone Encounter (Signed)
Ok to stop the doxycycline and let her system settle down. We will discuss when I see her. Wait on the Trelegy also.

## 2017-01-10 NOTE — Telephone Encounter (Signed)
Spoke with pt, she states she is on doxycycline for 10 extra days but feels the side effects are getting worse. She has nausea, severe bloating, breathing problems and she fainted today. She states the symptoms happen right after she takes the Doxy and it wears off but by the time it does, it is time to take another one. She does have an appt on 01/18/2017 for follow up and didn't know if she needed to come in sooner. Also she wants to know if she should start on Trelegy now? CY please advise.    Current Outpatient Medications on File Prior to Visit  Medication Sig Dispense Refill  . benzonatate (TESSALON) 200 MG capsule Take 1 capsule (200 mg total) by mouth 3 (three) times daily as needed for cough. 30 capsule 1  . doxycycline (VIBRA-TABS) 100 MG tablet Take 1 tablet (100 mg total) by mouth 2 (two) times daily. 20 tablet 0  . Fluticasone-Umeclidin-Vilant (TRELEGY ELLIPTA) 100-62.5-25 MCG/INH AEPB Inhale 1 puff into the lungs daily. 14 each 0  . irbesartan (AVAPRO) 150 MG tablet Take 0.5 tablets (75 mg total) 2 (two) times daily by mouth. 90 tablet 2  . peppermint oil liquid Use as directed on package. 8 mL 0  . polyethylene glycol (MIRALAX / GLYCOLAX) packet Take 17 g by mouth daily.     No current facility-administered medications on file prior to visit.    Allergies  Allergen Reactions  . Ethambutol Palpitations    Possible optic neuritis, possible gout attack  . Latex Rash  . Penicillins Hives    Breaks out in hives  . Prednisone Other (See Comments)     "out of mind" state  . Rifabutin Other (See Comments)    Loss of vision and anorexia  . Aspirin   . Betadine [Povidone Iodine]   . Codeine   . Iodinated Diagnostic Agents   . 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 Morphine And Related   . Sulfa Antibiotics   . Azithromycin Other (See Comments)    Heart rhythm issues-prolonged QT wave

## 2017-01-14 ENCOUNTER — Encounter: Payer: Self-pay | Admitting: Internal Medicine

## 2017-01-14 ENCOUNTER — Ambulatory Visit (INDEPENDENT_AMBULATORY_CARE_PROVIDER_SITE_OTHER): Payer: Medicare Other | Admitting: Internal Medicine

## 2017-01-14 VITALS — BP 124/68 | HR 100 | Ht 60.0 in | Wt 95.0 lb

## 2017-01-14 DIAGNOSIS — A310 Pulmonary mycobacterial infection: Secondary | ICD-10-CM

## 2017-01-14 DIAGNOSIS — J471 Bronchiectasis with (acute) exacerbation: Secondary | ICD-10-CM

## 2017-01-14 NOTE — Progress Notes (Signed)
HPI female never smoker followed for cough, bronchiectasis/MAIC , obstructive airway disease, history of myocarditis. Cavitary lesion with history of hemoptysis resected/ RML/RLL lobectomy 09/2014 , right pleural effusion, left breast cancer/seeds  +MAIC/ triple therapy begun 06/18/11- dc'd 2015 by Cardiology due to myocarditis ? Due to zith?.   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 FVC 1.76/77%, FEV1 1.12/65%, ratio 0.63, FEF 25-75% 0.59/42%. -----------------------------------------------------------------------------------------  11/08/16- 77 year old female never smoker followed for cough, bronchiectasis, obstructive airway disease , history of myocarditis Cavitary lesion with history of hemoptysis resected, RML/RLL lobectomy 09/2014 for bronchiectasis, right pleural effusion, left breast cancer/seeds/partial mastect. +MAIC/ triple therapy began 06/18/11- dc'd 2015 by Cardiology due to myocarditis ? Due to zith?. ---Bronchiectasis; Pt is having a productive cough again-yellow in color but usually in the mornings. Has specks of blood in it as well. Pt has noticed her breathing is not as good as before; denies any chest tightness or pain.  She is mainly bothered recently by abdominal bloating and constipation which are being worked up elsewhere. Has had some increased cough in the last month or so with weight or trace yellow sputum rare speck of blood last noted several weeks ago.  Doxycycline cleared her for a while after 2 rounds ending 1 week ago. We compared images and discussed chest x-rays from 3/18 and from 2017. Anoro overstimulated her but did help her breathing.  Has not been using her rescue inhaler.  01/14/17- 76 year old female never smoker followed for cough,  bronchiectasis, obstructive airway disease , history of myocarditis Cavitary lesion with history of hemoptysis resected, RML/RLL lobectomy 09/2014 for bronchiectasis, right pleural effusion, left breast cancer/seeds/partial mastect. +MAIC/ triple therapy began 06/18/11- dc'd 2015 by Cardiology due to myocarditis ? Due to zith?. Sputum Cx 12/25/16-  +  MAIC ---Bronchiectasis; Pt states she is increased SOB( worse than ever been-with exertion). Fatigued after activity as well. Pt stopped all meds per phone note.  Cough-productive-white to yellow/green in color. Runny nose. Denies any fever or chills.  Feels tired, a little queasy-had to stop second round of doxycycline a little early. We reviewed chest x-ray comparing December to March with definite increase right midlung zone density, looking similar to the way her original right lower lobe abscess started.  There is chronic elevation of the right hemidiaphragm with some effusion, since right lower lobectomy. CXR 01/04/17-  IMPRESSION: Stable chronic changes in the right lung and moderate right pleural effusion  ROS- see HPI    + = pos Constitutional:   No-   weight loss, night sweats, fevers, chills, fatigue, lassitude. HEENT:   No-  headaches, difficulty swallowing, tooth/dental problems, sore throat,       No-  sneezing, itching, ear ache, nasal congestion, + post nasal drip,  CV:  No-   chest pain, orthopnea, PND, swelling in lower extremities, anasarca, dizziness, palpitations Resp: +  shortness of breath with exertion or at rest.              +  productive cough,  little non-productive cough,  No recent coughing up of blood.              +  change in color of mucus.  No- wheezing.   Skin: No-   rash or lesions. GI:  No-   heartburn, indigestion, abdominal pain, nausea, vomiting,  GU:  MS:  No-   joint  pain or swelling.   Neuro-     nothing unusual Psych:  No- change in mood or affect. No depression or anxiety.  No memory loss.  OBJ-  Physical Exam General- Alert, Oriented, Affect-appropriate, Distress- none acute, +slender, talkative Skin- rash-none, lesions- none, excoriation- none Lymphadenopathy- none Head- atraumatic            Eyes- Gross vision intact, PERRLA, conjunctivae and secretions clear            Ears- Hearing aides            Nose- Clear, no-Septal dev, mucus, polyps, erosion, perforation             Throat- Mallampati II , mucosa clear , drainage- none, tonsils- atrophic Neck- flexible , trachea midline, no stridor , thyroid nl, carotid no bruit Chest - symmetrical excursion , unlabored           Heart/CV- +bigeminal pulse , no murmur , no gallop  , no rub, nl s1 s2                           - JVD- none , edema- none, stasis changes- none, varices- none           Lung- + coarse crackles right mid lung zone, cough- none , dullness+ right base, rub- none           Chest wall-  Abd-  Br/ Gen/ Rectal- Not done, not indicated Extrem- cyanosis- none, clubbing, none, atrophy- none, strength- nl Neuro- grossly intact to observation

## 2017-01-14 NOTE — Assessment & Plan Note (Signed)
Cough is always productive but is more discolored now despite recent doxycycline. She is going to retry Anoro.  I would prefer that she not use an inhaled steroid since she is having such trouble with this MAIC.

## 2017-01-14 NOTE — Patient Instructions (Addendum)
Referral to Infectious Disease Dr Tommy Medal- former patient-  Recurrence of MAIC pneumonia.  Consider nebulized tobramycin  Ok to retry your Anoro to see if it helps your breathing. If not, just stop it.  Keep appointment for March 26 with me

## 2017-01-14 NOTE — Assessment & Plan Note (Signed)
Culture positive again from 12/1[facility] 8/18 with progressive density right mid lung zone.  She does not tolerate any medication well and there was particular concern about myocarditis in 2013 possibly associated with Zithromax therapy. I would like her to see Dr Tommy Medal again for reassessment.  I do not know if nebulized tobramycin would have any role for her.  I hope to prevent further progression and potential need for further lung resection.

## 2017-01-18 ENCOUNTER — Ambulatory Visit: Payer: Medicare Other | Admitting: Internal Medicine

## 2017-01-21 ENCOUNTER — Ambulatory Visit: Payer: Medicare Other | Admitting: Infectious Disease

## 2017-01-23 ENCOUNTER — Encounter: Payer: Self-pay | Admitting: Cardiovascular Disease

## 2017-01-24 MED ORDER — HYDRALAZINE HCL 10 MG PO TABS: 10.0000 mg | ORAL_TABLET | Freq: Two times a day (BID) | ORAL | 1 refills | Status: DC

## 2017-01-24 NOTE — Telephone Encounter (Signed)
Called patient to review options.  She has been intolerant to acei, beta blockers, amlodipine and now arbs.  In searching her chart she previously took chlorthalidone 12.5 mg daily, but was d/c due to an upcoming surgical procedure as well as low sodium and potassium.    Will start her on hydralazine 10 mg bid to start and see her in the CVRR clinic in 2 weeks for review.   Her current home pressure is well controlled, but she only stopped the irbesartan day before yesterday.

## 2017-01-30 ENCOUNTER — Encounter: Payer: Self-pay | Admitting: Infectious Disease

## 2017-01-30 ENCOUNTER — Encounter: Payer: Self-pay | Admitting: Cardiovascular Disease

## 2017-01-30 ENCOUNTER — Ambulatory Visit (INDEPENDENT_AMBULATORY_CARE_PROVIDER_SITE_OTHER): Payer: Medicare Other | Admitting: Infectious Disease

## 2017-01-30 VITALS — BP 180/99 | HR 88 | Temp 98.0°F | Ht 59.0 in | Wt 94.0 lb

## 2017-01-30 DIAGNOSIS — I519 Heart disease, unspecified: Secondary | ICD-10-CM | POA: Diagnosis not present

## 2017-01-30 DIAGNOSIS — I514 Myocarditis, unspecified: Secondary | ICD-10-CM | POA: Diagnosis not present

## 2017-01-30 DIAGNOSIS — J479 Bronchiectasis, uncomplicated: Secondary | ICD-10-CM | POA: Diagnosis not present

## 2017-01-30 DIAGNOSIS — H5712 Ocular pain, left eye: Secondary | ICD-10-CM

## 2017-01-30 DIAGNOSIS — R9431 Abnormal electrocardiogram [ECG] [EKG]: Secondary | ICD-10-CM

## 2017-01-30 DIAGNOSIS — A310 Pulmonary mycobacterial infection: Secondary | ICD-10-CM | POA: Diagnosis present

## 2017-01-30 DIAGNOSIS — Z8679 Personal history of other diseases of the circulatory system: Secondary | ICD-10-CM | POA: Diagnosis not present

## 2017-01-30 DIAGNOSIS — A439 Nocardiosis, unspecified: Secondary | ICD-10-CM

## 2017-01-30 NOTE — Progress Notes (Signed)
[facility] for Infectious Disease   Chief complaint: Worsening coughing occasionally with hemoptysis but less so recently also with weight loss Subjective:      Patient ID: Mia Hernandez, female    DOB: [birth year], 77 y.o.   MRN: [id number]  HPI 77 year old lady with past medical history significant for diagnosis of Mycobacterium avium infection of the lungs, recent Nocardia  INTERVAL HiSTORY:     Started on therapy with clarithromycin rifampin and ethambutol 3 days per week. She had great difficulty tolerating the Biaxin which was she was taking it twice daily doses on the day she was taking it was reduced to once daily dosing on July 01 2012 Apparently at that time there was concern about toxicity although she states that now that she was found to need simply new hearing aids. Initially while on therapy for Mycobacterium avium showed mprovement in her symptoms of chronic daily cough and sputum production.   In Fall of 2013 while [place] 2013 apparently she had episodes of hemoptysis and was hospitalized due to Hospital in [place] where she was placed in isolation for rule out tuberculosis. Ultimately was realized that she was suffering from Mycobacterium avium infection alone.  She had repeat sputum taken Spring of 2014 and again grow Mycobacterium avium. The organism was sent for susceptibility testing and showed macrolide sensitivity and in vitro synergy with rifampin and ETH in inhibiting growth in lab. Amikacin would also be considered active at S she had.  I had Changed her to  daily therapy with macrolide (Azithromycin) ethambutol and rifampin. Since change she had been initiallycoughing less, she was gaining weight and was  absent fevers.   She saw Dr. Annamaria Boots in November 2014 when she reported 1 month she is coughing up more yellow/liquid phlem since on Rafampin. Chest x-ray was done and showed no changes. Sputum was sent for routine culture which only isolated normal oral  pharyngeal flora. AFB culture is without AFB seen on smear blood cultures in November DID grow M avium again   I had tried to get her on daily RIFABUTIN with continue azithro and ETH but she did not tolerate at all with severe anorexia, and apparent visual problems, stating that "I could not see for several hours."  She went back to Rifampin TIW,  azithro 570m daily, ETH 7031mTU, TH< SA< SU  In the interim she was seen by her Cardiologist Dr. CrSallyanne Kusterwho saw her in October and found NEW  deep symmetrical inverted T waves in all the inferior leads as well as V3-V6. The QTC prolongation on EKG that persistd on followup EKG. he was concern for possible antibiotic induced myocarditis and the patient stopped her anti-Mycobacterium drugs and had MRI of her heart performed which was normal echocardiogram was also normal.  Repeat EKG done in the next few days showed resolution of T wave changes and improvement in QT  Since coming off her anti-Mycobacterium avium drugs she had continued to cough on a daily basis   She had CXR that showed expansion of cavitary disease though most recent CXR showed stable findings.   Note her M avium was R to Moxifloxacin with MIC of 4.  It was I to linezolid with MIC of 16  It was S to clarithro with MIC of 2  Further MICs were:  Amikacin 16 Cipro 16 Ethambutol 8 Ethimodate > INH 8 Rifampin >8 Rifabutin 0.5 Streptomcin 32  Combination of ETH with clarithro, erythrom, rifampin, rifabutin all produced  no growth in vitro  See below      She came to our clinic multiple times in  Spring 2016 with worsening cough, with blood tinged sputum and actual hemoptysis, fatigue, lack of energy.  I corresponded with Dr Lorenda Cahill via email and he recommended not starting any therapy until he had a chance to review her case himself when she comes to visit him. She is on schedule to see him in August.  She was worked into clinic before seeing Dr. Lorenda Cahill with   worsening blood tinged sputum and we ultimately decided upon consultation with Dr Lorenda Cahill and with Dr Sallyanne Kuster to re-challenge the patient with Azithro and [facility] which we did and there have been no EKG changes so far to suggest pericarditis , myocarditis or issues with QT prolongation.  Since she started on meds she has had improvement in sputum, no longer blood tinged and she is now able to sleep.  She has now seen Dr Lorenda Cahill who collected sputum sample (though I cannot access it in [place]). Dr. Lorenda Cahill felt upon review of her films that her pulmonary disease was amenable to combined medical and surgical approach with resection of large cavitary areas from lungs to be considered by Dr. Elenor Quinones at [facility].   In the interm she grew Nocardia species from sputum and has been started on zyvox which she was tolerating  But then developed nose blleeds.  She ultimately underwent surgery at [facility] on 09/27/14 with bronchoscopy, thoracoscopy with removal of 2 lobes of lung. She was changed from zyvox to rocephin (to which her Norardia was S) and has remained on this with plan of 6 weeks of IV abx. It is believed that the infected part of lung was removed with surgery. She also grew M avium from prior culture from Dr.Stout's office.  Her cough had DRAMATICALLY improved postoperatively but she did develop significant postoperative subcutaneous emphysema. She had resumed her azithromycin and ETH. She has had some dysphagia due to subcutaneous emphysema but this is improving.  A few visits ago she developed NEW complaints of acute left toe pain, with pain in her left eye and decreased visual acuity, along with malaise, nausea and reduced hearing.She stopped EThambutol and with our advise also the azithromycin to avoid monotherapy for her M avium.   WE had in the interim considered procuring clofazamine. Dr. Brigitte Pulse at [facility] corresponded with me and was very skeptical that the patients eye pain was due to [facility] and  recommended rechallenge with [facility] at 34m/kg TIW along with continued Azithro first.   Unfortunately when she restarted those meds she began experiencing eye pain, joint pain and palpitations and [place] f [place].  She CLAIMS TODAY IN 2019 there was confirmation of the azithromycin causing her to have QT prolongation and myocarditis.  I see that she did have an EKG on January 06, 2016 which showed frequent PVCs but I do not see changes consistent with myocarditis.  Furthermore her QTC interval was similar in August 2018 when she was off of medications.   I had not seen her since that visit in the December 2017.  In the interim she has had worsening weight loss which she partly attributes to IBS.  She also though has had worsening cough at times with hemoptysis.  Imaging including CT scan of the lungs was done recently which shows November 2018:  1. Progressive bronchiectasis involving the right lower lung with areas of cylindrical and cystic  bronchiectasis. Patchy airspace nodules and endobronchial debris/ mucous plugging possibly related to a bronchiectasis related opportunistic infection. 2. Patchy areas of tree-in-bud appearance suggesting chronic inflammation or MAC. 3. No focal airspace consolidation, pulmonary edema or worrisome pulmonary mass. 4. Chronic right-sided pleural thickening and loculated fluid.  She is referred back to [place] by Dr. Annamaria Boots and she has yet again unsurprisingly growing Mycobacterium avium from her sputum cultures.  She is adamant that she will not take azithromycin again because she is confident that it caused her to have myocarditis and TC prolongation the second time though again I can find no confirmation of this in the electronic medical record.  I had contemplated regimen of clofazimine inhaled amikacin and oral azithromycin but without the macrolide I am not confident how well clofazimine and inhaled amikacin will do.  I am  certainly not anxious to challenge her with intravenous amikacin.  She mentioned try to get a second opinion from some of the [facility] but I recommended that if she wanted a second opinion the best person to go see will be Dr. Lorenda Cahill yet again.   Past Medical History:  Diagnosis Date  . Acute left eye pain 11/15/2014  . Adverse effect of general anesthetic    DELERIUM  . ALLERGIC RHINITIS   . Anxiety   . Arthritis   . Bronchiectasis ([place])   . Cancer ([place])    basal cell skin cancer removed  . Chronic back pain   . Complication of anesthesia    s/p lung surgery at [place] X 2 MONTHS  . Diverticulosis   . Dysphagia 10/13/2014  . Hydropneumothorax 11/15/2014  . Hypertension   . Hyponatremia 09/15/2014  . Internal hemorrhoids   . Irritable bowel syndrome with constipation   . Mycobacterium avium-intracellulare infection ([place])   . 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 Osteoporosis   . Stroke ([place])    TIA  SEVERAL YEARS AGO X 1 (34YRS -30 YRS AGO)  . Subcutaneous emphysema ([place]) 10/13/2014  . Toe pain 11/15/2014   Past Surgical History:  Procedure Laterality Date  . BREAST BIOPSY    . BREAST LUMPECTOMY WITH RADIOACTIVE SEED LOCALIZATION Left 10/05/2015   Procedure: LEFT BREAST LUMPECTOMY WITH RADIOACTIVE SEED LOCALIZATION;  Surgeon: Excell Seltzer, MD;  Location: [place];  Service: General;  Laterality: Left;  . CATARACT EXTRACTION W/ INTRAOCULAR LENS  IMPLANT, BILATERAL    . CHOLECYSTECTOMY    . LAMINECTOMY     cervical   . VAGINAL HYSTERECTOMY     Family History  Problem Relation Age of Onset  . Breast cancer Mother   . Stroke Father   . Pulmonary embolism Brother    Social History   Tobacco Use  . Smoking status: Never Smoker  . Smokeless tobacco: Never Used  Substance Use Topics  . Alcohol use: No  . Drug use: No     Review of Systems  Constitutional: Negative for  activity change, appetite change, diaphoresis, fatigue and unexpected weight change.  HENT: Negative for congestion, sinus pressure and sneezing.   Eyes: Negative for photophobia and visual disturbance.  Respiratory: Positive for cough and shortness of breath. Negative for stridor.   Cardiovascular: Negative for palpitations and leg swelling.  Gastrointestinal: Positive for diarrhea. Negative for abdominal distention, abdominal pain, anal bleeding, blood in stool, constipation and vomiting.  Genitourinary: Negative for difficulty urinating, dysuria, flank pain and hematuria.  Musculoskeletal: Negative for  arthralgias, back pain, gait problem and joint swelling.  Skin: Negative for color change, pallor and wound.  Neurological: Negative for dizziness, tremors, weakness and light-headedness.  Hematological: Negative for adenopathy. Does not bruise/bleed easily.  Psychiatric/Behavioral: Negative for agitation, behavioral problems, decreased concentration, dysphoric mood and sleep disturbance.       Objective:   Physical Exam  Constitutional: She is oriented to person, place, and time. No distress.  HENT:  Head: Normocephalic and atraumatic.  Mouth/Throat: No oropharyngeal exudate.  Eyes: Conjunctivae and EOM are normal. No scleral icterus.  Neck: Normal range of motion. Neck supple.  Cardiovascular: Normal rate, regular rhythm and normal heart sounds. Exam reveals no gallop and no friction rub.  No murmur heard. Pulmonary/Chest: Effort normal. No respiratory distress. She has decreased breath sounds in the right lower field. She has no rhonchi.    Crackles at bases, prolonged expiratory phase  Dullness to percussion right base  Abdominal: She exhibits no distension.  Musculoskeletal: She exhibits no edema or tenderness.  Neurological: She is alert and oriented to person, place, and time. She exhibits normal muscle tone. Coordination normal.  Skin: Skin is warm and dry. No rash noted.  She is not diaphoretic. No erythema. No pallor.  Psychiatric: She has a normal mood and affect. Her behavior is normal. Judgment and thought content normal.  Nursing note and vitals reviewed.         Assessment & Plan:    #2  Mycobacterium Avium Intracellulare infection  sp resection of TWO lobes of lung.  Seems to think that surgery will be an option again which I sincerely doubt.   I would like to trial her on azithromycin clofazimine and inhaled amikacin but she is confident that the azithromycin caused QT prolongation and myocardial cystitis a second time.  I will send a note to Dr  Orene Desanctis. Again. Perhaps he can elucidate  #2 Abnormal EKG : ? Of drug induced vs idiopathic pericarditis, myocarditis See above  #3 Eye pain on ETH: will not challenge wth this again  #4 HOH would not use systemic amikacin  #4  Bronchiectasis: sp lobectomy  I spent greater than 25 minutes with the patient including greater than 50% of time in face to face counsel of the patient and her husband re limited options for treatment given her tolerances of medications.  And in coordination of her care.

## 2017-01-30 NOTE — Progress Notes (Signed)
[place] for Infectious Disease Pharmacy Visit  HPI: Mia Hernandez is a 77 y.o. 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  Patient Active Problem List   Diagnosis Date Noted  . IBS (irritable bowel syndrome) 04/04/2016  . Pleural effusion 02/16/2015  . 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 Toe pain 11/15/2014  . Acute left eye pain 11/15/2014  . 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 Subcutaneous emphysema ([place]) 10/13/2014  . Dysphagia 10/13/2014  . Arthritis 09/15/2014  . Temporary cerebral vascular dysfunction 09/15/2014  . 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 Hyponatremia 09/15/2014  . 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 QT prolongation 03/24/2014  . Asymptomatic LV dysfunction 11/11/2013  . Cardiac disease 11/11/2013  . Abnormal resting ECG findings 10/22/2013  . Abnormal ECG 10/22/2013  . Rhinitis 07/11/2013  . Chronic rhinitis 07/11/2013  . Essential (primary) hypertension 06/05/2012  . 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 Bronchiectasis ([place])   . Cough 04/22/2011    Patient's Medications  New Prescriptions   No medications on file  Previous Medications   FLUTICASONE-UMECLIDIN-VILANT (TRELEGY ELLIPTA) 100-62.5-25 MCG/INH AEPB    Inhale 1 puff into the lungs daily.   HYDRALAZINE (APRESOLINE) 10 MG TABLET    Take 1 tablet (10 mg total) by mouth 2 (two) times daily.   PEPPERMINT OIL LIQUID    Use as directed on package.   POLYETHYLENE GLYCOL (MIRALAX / GLYCOLAX) PACKET    Take 17 g by mouth daily.  Modified Medications   No medications on file  Discontinued Medications   No medications on file    Allergies: Allergies  Allergen Reactions  . Ethambutol Palpitations    Possible optic neuritis, possible gout attack  . Latex Rash  . Penicillins Hives    Breaks out in hives  . Prednisone Other (See Comments)     "out of mind" state  . Rifabutin Other (See  Comments)    Loss of vision and anorexia  . Aspirin   . Betadine [Povidone Iodine]   . Codeine   . Doxycycline     faints  . Iodinated Diagnostic Agents   . 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 Morphine And Related   . Sulfa Antibiotics   . Azithromycin Other (See Comments)    Heart rhythm issues-prolonged QT wave    Past Medical History: Past Medical History:  Diagnosis Date  . Acute left eye pain 11/15/2014  . Adverse effect of general anesthetic    DELERIUM  . ALLERGIC RHINITIS   . Anxiety   . Arthritis   . Bronchiectasis ([place])   . Cancer ([place])    basal cell skin cancer removed  . Chronic back pain   . Complication of anesthesia    s/p lung surgery at [facility] X 2 MONTHS  . Diverticulosis   . Dysphagia 10/13/2014  . Hydropneumothorax 11/15/2014  . Hypertension   . Hyponatremia 09/15/2014  . Internal hemorrhoids   . Irritable bowel syndrome with constipation   . 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 Myocarditis due to drug ([place]) 05/04/2014  . 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 Osteoporosis   . Stroke ([place])    TIA  SEVERAL YEARS AGO X 1 (24YRS -30 YRS AGO)  . Subcutaneous emphysema ([place]) 10/13/2014  . Toe pain 11/15/2014    Social History: Social History   Socioeconomic History  .  Marital status: Married    Spouse name: None  . Number of children: None  . Years of education: None  . Highest education level: None  Social Needs  . Financial resource strain: None  . Food insecurity - worry: None  . Food insecurity - inability: None  . Transportation needs - medical: None  . Transportation needs - non-medical: None  Occupational History  . Occupation: retired  Tobacco Use  . Smoking status: Never Smoker  . Smokeless tobacco: Never Used  Substance and Sexual Activity  . Alcohol use: No  . Drug use: No  . Sexual activity: None  Other Topics Concern  . None  Social  History Narrative  . None    Labs: No results found for: HIV1RNAQUANT, HIV1RNAVL, CD4TABS, HEPBSAB, HEPBSAG, HCVAB  Lipids:    Component Value Date/Time   CHOL 187 11/01/2016 1127   TRIG 52.0 11/01/2016 1127   HDL 81.00 11/01/2016 1127   CHOLHDL 2 11/01/2016 1127   VLDL 10.4 11/01/2016 1127   LDLCALC 96 11/01/2016 1127    Current Regimen: None  Assessment: Mia Hernandez is here today with her husband to see Dr. Tommy Medal for her MAC pulmonary infection. She last saw Dr. Tommy Medal in 2017. Since then, she has been on multiple regimens for her MAC infection, including azithromycin, rifampin, ethambutol - all of which she could not tolerate.  She had QTc prolongation with the azithromycin and tells me today that it almost gave her a heart attack twice.  She also had vision issues and blurriness with the ethambutol x 2. She could not tolerate the rifampin either as it made her nauseous and vomit.   She has been following up at [facility] with Dr. Brigitte Pulse who referred her for surgical resection of the cavitary areas of her lungs. She had relapse with her MAC infection which again was isolated in December of 2018.  Dr. Tommy Medal originally had plans to restart azithromycin and obtain inhaled liposomal amikacin and clofazimine through [employer] and the FDA. I went in to discuss these medications with her.  She is refusing to start anything at this time and states she won't take the azithromycin again for which I understand.  She is also hesitant about using the amikacin as she already has hearing loss (less associated with inhaled liposomal amikacin than intravenous, but ototoxicity can definitely still happen).  She wishes to speak to her cardiologist, pulmonologist, and [facility] before doing anything.  She would not even sign the amikacin application at this time. I explained to her that this program would help with her paying for the amikacin and that the clofazimine would be free, but she wanted to  check with [facility] first.  She will come back and see Dr. Tommy Medal in 2 months.  Plan: - F/u with Dr. Tommy Medal 3/25 at 945am  Mia Hernandez, PharmD, [place], [place] for Infectious Disease 01/30/2017, 12:04 PM

## 2017-02-04 ENCOUNTER — Encounter: Payer: Self-pay | Admitting: Infectious Disease

## 2017-02-05 ENCOUNTER — Telehealth: Payer: Self-pay | Admitting: Internal Medicine

## 2017-02-05 NOTE — Telephone Encounter (Signed)
Pt states her Benancio Deeds has become worse and states she might need to go to [facility] to see Dr. Tommy Medal again from Infectious Disease.  Pt states she has been having more problems with her breathing when she tries to do various activities.  Looked at pt's last ov with [facility] where a referral was placed for pt to go back to see Dr. Tommy Medal since she was a former pt of his.  I stated to pt to reach out to Dr. Tommy Medal to see if you could come in for an appt due to the disease probably progressing.  Pt expressed understanding. Nothing further needed at this current time.

## 2017-02-06 NOTE — Telephone Encounter (Signed)
Per Dr Tommy Medal patient needs a follow up visit with pharmacy to discuss medications.  Patient would like to see Minh.   Laverle Patter, RN

## 2017-02-07 ENCOUNTER — Ambulatory Visit: Payer: Medicare Other

## 2017-02-11 ENCOUNTER — Telehealth: Payer: Self-pay | Admitting: Pharmacist

## 2017-02-12 ENCOUNTER — Ambulatory Visit: Payer: Medicare Other

## 2017-02-14 ENCOUNTER — Ambulatory Visit: Payer: Medicare Other | Admitting: Pharmacist Clinician (PhC)/ Clinical Pharmacy Specialist

## 2017-02-14 ENCOUNTER — Other Ambulatory Visit: Payer: Self-pay | Admitting: Pharmacist

## 2017-02-14 DIAGNOSIS — A310 Pulmonary mycobacterial infection: Secondary | ICD-10-CM

## 2017-02-14 NOTE — Progress Notes (Signed)
HPI: Mia Hernandez is a 77 y.o. female who is to see pharmacy to discuss her MAC treatment again.   Allergies: Allergies  Allergen Reactions  . Ethambutol Palpitations    Possible optic neuritis, possible gout attack  . Latex Rash  . Penicillins Hives    Breaks out in hives  . Prednisone Other (See Comments)     "out of mind" state  . Rifabutin Other (See Comments)    Loss of vision and anorexia  . Aspirin   . Betadine [Povidone Iodine]   . Codeine   . Doxycycline     faints  . Iodinated Diagnostic Agents   . 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 Morphine And Related   . Sulfa Antibiotics   . Azithromycin Other (See Comments)    Heart rhythm issues-prolonged QT wave    Vitals:    Past Medical History: Past Medical History:  Diagnosis Date  . Acute left eye pain 11/15/2014  . Adverse effect of general anesthetic    DELERIUM  . ALLERGIC RHINITIS   . Anxiety   . Arthritis   . Bronchiectasis ([place])   . Cancer ([place])    basal cell skin cancer removed  . Chronic back pain   . Complication of anesthesia    s/p lung surgery at [facility] X 2 MONTHS  . Diverticulosis   . Dysphagia 10/13/2014  . Hydropneumothorax 11/15/2014  . Hypertension   . Hyponatremia 09/15/2014  . Internal hemorrhoids   . Irritable bowel syndrome with constipation   . 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 Myocarditis due to drug ([place]) 05/04/2014  . 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 Osteoporosis   . Stroke ([place])    TIA  SEVERAL YEARS AGO X 1 (16YRS -30 YRS AGO)  . Subcutaneous emphysema ([place]) 10/13/2014  . Toe pain 11/15/2014    Social History: Social History   Socioeconomic History  . Marital status: Married    Spouse name: Not on file  . Number of children: Not on file  . Years of education: Not on file  . Highest education level: Not on file  Social Needs  . Financial resource strain: Not on file  . Food  insecurity - worry: Not on file  . Food insecurity - inability: Not on file  . Transportation needs - medical: Not on file  . Transportation needs - non-medical: Not on file  Occupational History  . Occupation: retired  Tobacco Use  . Smoking status: Never Smoker  . Smokeless tobacco: Never Used  Substance and Sexual Activity  . Alcohol use: No  . Drug use: No  . Sexual activity: Not on file  Other Topics Concern  . Not on file  Social History Narrative  . Not on file   Note her M avium was R to Moxifloxacin with MIC of 4.  It was I to linezolid with MIC of 16  It was S to clarithro with MIC of 2  Further MICs were:  Amikacin 16 Cipro 16 Ethambutol 8 Ethimodate > INH 8 Rifampin >8 Rifabutin 0.5 Streptomcin 32 Previous Regimen: Azithromycin/ethambutol/rifampin -  rifabutin  Current Regimen: None  Labs: No results found for: HIV1RNAQUANT, HIV1RNAVL, CD4TABS, HEPBSAB, HEPBSAG, HCVAB  CrCl: CrCl cannot be calculated (Patient's most recent lab result is older than the maximum 21 days allowed.).  Lipids:    Component Value Date/Time   CHOL 187 11/01/2016 1127   TRIG 52.0 11/01/2016  1127   HDL 81.00 11/01/2016 1127   CHOLHDL 2 11/01/2016 1127   VLDL 10.4 11/01/2016 1127   LDLCALC 96 11/01/2016 1127    Assessment: Mia Hernandez is here again to d/w pharmacy about her MAC treatment again. She has had multiple issues with various treatment in the past (QT prolongation, intolerability to rifabutin, vision issue ethambutol). She is one of those patients that is anxious about almost anything. We discuss the plan of using azithromycin/clofazimine/amikacin to her today. At first, she was adamant about not restarting azithromycin because of the QT prolongation issue again. Explained to her that Dr. Tommy Medal recently had a discussion with Dr. Sallyanne Kuster and it was suspected that the QT issue was mainly due to myocarditis. It didn't not present at the re-challenge. She the asked [place]  about the cost for Arikace. Explained to her that we would not know until her prior authorization is even approved. After an hour of discussion, she agreed to proceed of getting clofazimine and Arikace. [facility] will try to get her the clofazimine and I'll start the Arikace process.   Recommendations:  Start process for Arikace 523m IH qday and clofazime 1017mPO qday Start azithromycin 25093mO qday once approved  MinOnnie BoerharmD, BCPS, AAHIVP, CPP [facility] 02/14/2017, 11:31 AM

## 2017-02-15 ENCOUNTER — Encounter: Payer: Self-pay | Admitting: Infectious Disease

## 2017-02-18 ENCOUNTER — Telehealth: Payer: Self-pay | Admitting: Pharmacist Clinician (PhC)/ Clinical Pharmacy Specialist

## 2017-02-18 MED ORDER — AMIKACIN SULFATE LIPOSOME 590 MG/8.4ML IN SUSP: 590.0000 mg | Freq: Every day | RESPIRATORY_TRACT | 11 refills | Status: DC

## 2017-02-18 NOTE — Telephone Encounter (Signed)
Patient's husband is calling to make sure we send certificate of medical necessity to [employer] for Arikayce . He thinks the insurance will pay for medication.  Laverle Patter, RN

## 2017-02-18 NOTE — Telephone Encounter (Signed)
Got a call from [place] this morning because Mia Hernandez didn't want to go with them. She rather goes with [facility]. Had to explain it to her again. She is requesting that we send medical necessity form to [facility] also. This will be nightmarish situation. I'll send the rx to [facility].

## 2017-02-18 NOTE — Telephone Encounter (Signed)
Im so sorry

## 2017-02-19 ENCOUNTER — Other Ambulatory Visit: Payer: Self-pay | Admitting: Family Medicine

## 2017-02-19 ENCOUNTER — Encounter: Payer: Self-pay | Admitting: Cardiovascular Disease

## 2017-02-19 DIAGNOSIS — Z139 Encounter for screening, unspecified: Secondary | ICD-10-CM

## 2017-02-27 ENCOUNTER — Encounter: Payer: Self-pay | Admitting: Cardiovascular Disease

## 2017-02-28 ENCOUNTER — Other Ambulatory Visit: Payer: Self-pay | Admitting: Pharmacist

## 2017-03-01 MED ORDER — OLMESARTAN MEDOXOMIL 20 MG PO TABS: 20.0000 mg | ORAL_TABLET | Freq: Every day | ORAL | 1 refills | Status: DC

## 2017-03-04 ENCOUNTER — Telehealth: Payer: Self-pay | Admitting: *Deleted

## 2017-03-04 NOTE — Telephone Encounter (Signed)
Patient husband called for her to let Dr Tommy Medal know that the pharmacist advised her to take her albuterol prior to taking her Arikayce and they wanted to know if that is correct. Advised will let the doctor know and give them a call back once he responds.

## 2017-03-05 NOTE — Telephone Encounter (Signed)
Those instructions sound good to me certainly if they came from [place] or [facility]

## 2017-03-06 ENCOUNTER — Other Ambulatory Visit: Payer: Self-pay | Admitting: Pharmacist Clinician (PhC)/ Clinical Pharmacy Specialist

## 2017-03-06 MED ORDER — AMBULATORY NON FORMULARY MEDICATION: 2.0000 | Freq: Every day | 3 refills | Status: DC

## 2017-03-06 MED ORDER — AZITHROMYCIN 250 MG PO TABS: 250.0000 mg | ORAL_TABLET | Freq: Every day | ORAL | 11 refills | Status: DC

## 2017-03-06 NOTE — Progress Notes (Signed)
Send the azithromycin to [facility].

## 2017-03-06 NOTE — Progress Notes (Signed)
Called Mia Hernandez today to tell her not to start anything yet until we can get the clofazimine in. Once we get that in, we plan to stagger her meds on restart. Azith>>clofazimine>>Arikace. She understand not to start until she gets the words from [place].

## 2017-03-07 ENCOUNTER — Encounter: Payer: Self-pay | Admitting: Cardiovascular Disease

## 2017-03-07 ENCOUNTER — Encounter: Payer: Self-pay | Admitting: Gastroenterology

## 2017-03-07 ENCOUNTER — Ambulatory Visit (INDEPENDENT_AMBULATORY_CARE_PROVIDER_SITE_OTHER): Payer: Medicare Other | Admitting: Gastroenterology

## 2017-03-07 VITALS — BP 140/88 | HR 80 | Ht 59.25 in | Wt 95.2 lb

## 2017-03-07 DIAGNOSIS — R140 Abdominal distension (gaseous): Secondary | ICD-10-CM

## 2017-03-07 MED ORDER — TRIAMTERENE-HCTZ 37.5-25 MG PO CAPS: 1.0000 | ORAL_CAPSULE | Freq: Every day | ORAL | 5 refills | Status: DC

## 2017-03-07 NOTE — Progress Notes (Signed)
HPI :  77 year old female here for follow-up.  She has a history of MAC/bronchiectasis, with a pleural effusion.   She has previously seen me for problems with intestinal gas and bloating, with intermittent severe abdominal distention.  She states this is been going on for about a portion of 9 months at this point.  She states her bowels are moving okay at this point she is not using anything for them.  She is eating normal.  She denies any early satiety.  She denies any nausea/vomiting/belching.  She continues to have intermittent severe abdominal gas and bloating.  We had tried her on a low 5 map diet which she did not think helped.  She has been using phenol, trial of IB Gard did not help.  Simethicone did not help.  I had offered her a trial of rifaximin for empiric treatment of SIBO, she has a severe allergy to rifabutin she declined trial of rifaximin. She has not had benefit with probiotics.  She states she is try to eat smaller meals which she thinks helps make her feel better.  Of note she raises a question about the use of ARB use.  She has been on these for the past several months, she has read about potential enteropathy related to these medications.  She is currently on Erbsartan and was recommended to switch to Benicar.  She has significant reservations about this.  She has had negative serologic testing for celiac disease. She had an MRI of her abdomen on October which did not show any significant pathology other than right-sided pleural effusion and bronchiectasis.  She is due to start triple antibiotic regimen for MAC in the upcoming days.  MRI abdomen - 11/02/2016 - prominent stool burden, R pleural effusion small, bronchiectasis, mild intrahepatic dilation likely secondary to cholecystectomy, chronic compression fx  Colonoscopy 01/29/2008 - diverticulosis    Past Medical History:  Diagnosis Date  . Acute left eye pain 11/15/2014  . Adverse effect of general anesthetic    DELERIUM    . ALLERGIC RHINITIS   . Anxiety   . Arthritis   . Bronchiectasis ([place])   . Cancer ([place])    basal cell skin cancer removed  . Chronic back pain   . Complication of anesthesia    s/p lung surgery at [facility] X 2 MONTHS  . Diverticulosis   . Dysphagia 10/13/2014  . Hydropneumothorax 11/15/2014  . Hypertension   . Hyponatremia 09/15/2014  . Internal hemorrhoids   . Irritable bowel syndrome with constipation   . 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 Myocarditis due to drug ([place]) 05/04/2014  . 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 Osteoporosis   . Stroke ([place])    TIA  SEVERAL YEARS AGO X 1 (28YRS -30 YRS AGO)  . Subcutaneous emphysema ([place]) 10/13/2014  . Toe pain 11/15/2014     Past Surgical History:  Procedure Laterality Date  . BREAST BIOPSY    . BREAST LUMPECTOMY WITH RADIOACTIVE SEED LOCALIZATION Left 10/05/2015   Procedure: LEFT BREAST LUMPECTOMY WITH RADIOACTIVE SEED LOCALIZATION;  Surgeon: Excell Seltzer, MD;  Location: [facility];  Service: General;  Laterality: Left;  . CATARACT EXTRACTION W/ INTRAOCULAR LENS  IMPLANT, BILATERAL    . CHOLECYSTECTOMY    . LAMINECTOMY     cervical   . VAGINAL HYSTERECTOMY     Family History  Problem Relation Age of Onset  . Breast cancer Mother   . Stroke Father   .  Pulmonary embolism Brother    Social History   Tobacco Use  . Smoking status: Never Smoker  . Smokeless tobacco: Never Used  Substance Use Topics  . Alcohol use: No  . Drug use: No   Current Outpatient Medications  Medication Sig Dispense Refill  . olmesartan (BENICAR) 20 MG tablet Take 1 tablet (20 mg total) by mouth daily. 90 tablet 1  . AMBULATORY NON FORMULARY MEDICATION Take 2 tablets by mouth daily. Medication Name: Clofazimine 46m  tablets (Patient not taking: Reported on 03/07/2017) 200 tablet 3  . Amikacin Sulfate Liposome (ARIKAYCE) 590 MG/8.4ML SUSP Inhale 590 mg into the lungs daily. (Patient not  taking: Reported on 03/07/2017) 30 vial 11  . azithromycin (ZITHROMAX) 250 MG tablet Take 1 tablet (250 mg total) by mouth daily. (Patient not taking: Reported on 03/07/2017) 30 each 11   No current facility-administered medications for this visit.    Allergies  Allergen Reactions  . Ethambutol Palpitations    Possible optic neuritis, possible gout attack  . Latex Rash  . Penicillins Hives    Breaks out in hives  . Prednisone Other (See Comments)     "out of mind" state  . Rifabutin Other (See Comments)    Loss of vision and anorexia  . Aspirin   . Betadine [Povidone Iodine]   . Codeine   . Doxycycline     faints  . Iodinated Diagnostic Agents   . 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 Morphine And Related   . Sulfa Antibiotics   . Azithromycin Other (See Comments)    Heart rhythm issues-prolonged QT wave     Review of Systems: All systems reviewed and negative except where noted in HPI.   Lab Results  Component Value Date   WBC 6.3 11/01/2016   HGB 14.0 11/01/2016   HCT 41.8 11/01/2016   MCV 92.8 11/01/2016   PLT 291.0 11/01/2016    Lab Results  Component Value Date   CREATININE 0.52 11/01/2016   BUN 12 11/01/2016   NA 134 (L) 11/01/2016   K 4.5 11/01/2016   CL 95 (L) 11/01/2016   CO2 33 (H) 11/01/2016    Lab Results  Component Value Date   ALT 20 11/01/2016   AST 29 11/01/2016   ALKPHOS 83 11/01/2016   BILITOT 0.6 11/01/2016     Physical Exam: BP 140/88 (BP Location: Left Arm, Patient Position: Sitting, Cuff Size: Normal)   Pulse 80   Ht 4' 11.25" (1.505 m) Comment: height measured without shoes  Wt 95 lb 4 oz (43.2 kg)   BMI 19.08 kg/m  Constitutional: Pleasant, thin female in no acute distress. HEENT: Normocephalic and atraumatic. Conjunctivae are normal. No scleral icterus. Neck supple.  Cardiovascular: Normal rate, regular rhythm.  Pulmonary/chest: Effort normal and breath sounds are coarse bilaterall.  Abdominal:  Soft, nondistended, nontender. . There are no masses palpable. No hepatomegaly. Extremities: no edema Lymphadenopathy: No cervical adenopathy noted. Neurological: Alert and oriented to person place and time. Skin: Skin is warm and dry. No rashes noted. Psychiatric: Normal mood and affect. Behavior is normal.   ASSESSMENT AND PLAN: 78year old female here for reassessment following issues:  Intestinal gas / bloating - this bothers her periodically and significantly.  I discussed the physiology behind intestinal gas and bloating potential causes with her husband again today.  She has had no benefit with several treatment options to include probiotics, simethicone, [place], IB gard, low FODMAP diet.  I had recommended  empiric therapy with rifaximin which she will not try due to severe allergy to rifabutin.  She has tested negative for celiac disease. Enteropathy due to irbesartan is possible and has been reported, but she does not have any significant diarrhea however.  To evaluate for this would entail endoscopic evaluation.  I think it otherwise may be reasonable to hold her irbesartan for several weeks and see if she notes improvement.  She will talk to her prescribing physician about this.  Otherwise I would offer her empiric antibiotics to treat SIBO, but she is due to start a very strong antibiotic regimen for her MAC (azithromycin, clofenazine, amikacin).  I would await her course of this antibiotic regimen, she understands this has potential to cause GI upset, and may likely change her small bowel flora.  If her symptoms persist despite antibiotics we can consider EGD and colonoscopy to further evaluate.  She agreed with the plan and will let me know how she is doing in the upcoming weeks to months.  Niwot Cellar, MD [facility] Gastroenterology Pager [phone]

## 2017-03-07 NOTE — Patient Instructions (Addendum)
If you are age 78 or older, your body mass index should be between 23-30. Your Body mass index is 19.08 kg/m. If this is out of the aforementioned range listed, please consider follow up with your Primary Care Provider.  If you are age 24 or younger, your body mass index should be between 19-25. Your Body mass index is 19.08 kg/m. If this is out of the aformentioned range listed, please consider follow up with your Primary Care Provider.     Thank you for entrusting me with your care and for choosing [facility], Dr. Wailea Cellar

## 2017-03-08 DIAGNOSIS — H906 Mixed conductive and sensorineural hearing loss, bilateral: Secondary | ICD-10-CM | POA: Diagnosis not present

## 2017-03-11 ENCOUNTER — Telehealth: Payer: Self-pay | Admitting: *Deleted

## 2017-03-11 NOTE — Telephone Encounter (Signed)
Thanks Darnelle Maffucci I don't know what it is going to take w her hospitalization and directly observed therapy the appt is a good idea

## 2017-03-11 NOTE — Telephone Encounter (Signed)
Patient called to report that she has not received all of her new medications yet so she has not started. She reports that for the past week she has had dizziness and fainting spells. She is spitting up blood and has increased mucus. Her breathing is worse but no fever. She would like to talk with the doctor prior to starting medication since what she is having are side effects of the new medications. She is afraid to start the meds even though she knows they are her last resort for MAC. Advised her will let the provider know and scheduled her for an appointment Monday 03/18/17 at 9 am.

## 2017-03-14 ENCOUNTER — Other Ambulatory Visit: Payer: Self-pay | Admitting: Pharmacist

## 2017-03-15 ENCOUNTER — Ambulatory Visit
Admission: RE | Admit: 2017-03-15 | Discharge: 2017-03-15 | Disposition: A | Discharge status: Home / Self Care | Payer: Medicare Other | Source: Ambulatory Visit | Attending: Family Medicine | Admitting: Family Medicine

## 2017-03-15 DIAGNOSIS — Z1231 Encounter for screening mammogram for malignant neoplasm of breast: Secondary | ICD-10-CM | POA: Diagnosis not present

## 2017-03-15 DIAGNOSIS — Z139 Encounter for screening, unspecified: Secondary | ICD-10-CM

## 2017-03-18 ENCOUNTER — Ambulatory Visit (INDEPENDENT_AMBULATORY_CARE_PROVIDER_SITE_OTHER): Payer: Medicare Other | Admitting: Infectious Disease

## 2017-03-18 ENCOUNTER — Encounter: Payer: Self-pay | Admitting: Infectious Disease

## 2017-03-18 VITALS — BP 174/83 | HR 87 | Temp 98.0°F | Ht 59.0 in | Wt 95.2 lb

## 2017-03-18 DIAGNOSIS — J471 Bronchiectasis with (acute) exacerbation: Secondary | ICD-10-CM

## 2017-03-18 DIAGNOSIS — R042 Hemoptysis: Secondary | ICD-10-CM | POA: Diagnosis not present

## 2017-03-18 DIAGNOSIS — R42 Dizziness and giddiness: Secondary | ICD-10-CM | POA: Diagnosis not present

## 2017-03-18 DIAGNOSIS — R630 Anorexia: Secondary | ICD-10-CM | POA: Diagnosis not present

## 2017-03-18 DIAGNOSIS — A319 Mycobacterial infection, unspecified: Secondary | ICD-10-CM | POA: Diagnosis not present

## 2017-03-18 DIAGNOSIS — K581 Irritable bowel syndrome with constipation: Secondary | ICD-10-CM | POA: Diagnosis not present

## 2017-03-18 DIAGNOSIS — J029 Acute pharyngitis, unspecified: Secondary | ICD-10-CM

## 2017-03-18 DIAGNOSIS — A310 Pulmonary mycobacterial infection: Secondary | ICD-10-CM | POA: Diagnosis not present

## 2017-03-18 MED ORDER — AZITHROMYCIN 250 MG PO TABS: 250.0000 mg | ORAL_TABLET | Freq: Every day | ORAL | 3 refills | Status: DC

## 2017-03-18 NOTE — Progress Notes (Signed)
[facility]   Chief complaint: Worsening coughing occasionally with hemoptysis weight loss, sore throat, dizziness, and poor appetite   Subjective:      Patient ID: Mia Hernandez, female    DOB: 03/02/1940, 77 y.o.   MRN: [id number]  HPI 77 year old lady with past medical history significant for diagnosis of Mycobacterium avium infection of the lungs, recent Nocardia  INTERVAL HiSTORY:     Started on therapy with clarithromycin rifampin and ethambutol 3 days per week. She had great difficulty tolerating the Biaxin which was she was taking it twice daily doses on the day she was taking it was reduced to once daily dosing on July 01 2012 Apparently at that time there was concern about toxicity although she states that now that she was found to need simply new hearing aids. Initially while on therapy for Mycobacterium avium showed mprovement in her symptoms of chronic daily cough and sputum production.   In Fall of 2013 while [place] 2013 apparently she had episodes of hemoptysis and was hospitalized due to Hospital in [place] where she was placed in isolation for rule out tuberculosis. Ultimately was realized that she was suffering from Mycobacterium avium infection alone.  She had repeat sputum taken Spring of 2014 and again grow Mycobacterium avium. The organism was sent for susceptibility testing and showed macrolide sensitivity and in vitro synergy with rifampin and ETH in inhibiting growth in lab. Amikacin would also be considered active at S she had.  I had Changed her to  daily therapy with macrolide (Azithromycin) ethambutol and rifampin. Since change she had been initiallycoughing less, she was gaining weight and was  absent fevers.   She saw Dr. Annamaria Boots in November 2014 when she reported 1 month she is coughing up more yellow/liquid phlem since on Rafampin. Chest x-ray was done and showed no changes. Sputum was sent for routine culture which only isolated  normal oral pharyngeal flora. AFB culture is without AFB seen on smear blood cultures in November DID grow M avium again   I had tried to get her on daily RIFABUTIN with continue azithro and ETH but she did not tolerate at all with severe anorexia, and apparent visual problems, stating that "I could not see for several hours."  She went back to Rifampin TIW,  azithro 528m daily, ETH 7033mTU, TH< SA< SU  In the interim she was seen by her Cardiologist Dr. CrSallyanne Kusterwho saw her in October and found NEW  deep symmetrical inverted T waves in all the inferior leads as well as V3-V6. The QTC prolongation on EKG that persistd on followup EKG. he was concern for possible antibiotic induced myocarditis and the patient stopped her anti-Mycobacterium drugs and had MRI of her heart performed which was normal echocardiogram was also normal.  Repeat EKG done in the next few days showed resolution of T wave changes and improvement in QT  Since coming off her anti-Mycobacterium avium drugs she had continued to cough on a daily basis   She had CXR that showed expansion of cavitary disease though most recent CXR showed stable findings.   Note her M avium was R to Moxifloxacin with MIC of 4.  It was I to linezolid with MIC of 16  It was S to clarithro with MIC of 2  Further MICs were:  Amikacin 16 Cipro 16 Ethambutol 8 Ethimodate > INH 8 Rifampin >8 Rifabutin 0.5 Streptomcin 32  Combination of ETH with clarithro, erythrom, rifampin, rifabutin  all produced no growth in vitro  See below      She came to our clinic multiple times in  Spring 2016 with worsening cough, with blood tinged sputum and actual hemoptysis, fatigue, lack of energy.  I corresponded with Dr Lorenda Cahill via email and he recommended not starting any therapy until he had a chance to review her case himself when she comes to visit him. She is on schedule to see him in August.  She was worked into clinic before seeing Dr. Lorenda Cahill  with  worsening blood tinged sputum and we ultimately decided upon consultation with Dr Lorenda Cahill and with Dr Sallyanne Kuster to re-challenge the patient with Azithro and [facility] which we did and there have been no EKG changes so far to suggest pericarditis , myocarditis or issues with QT prolongation.  Since she started on meds she has had improvement in sputum, no longer blood tinged and she is now able to sleep.  She was then seen  Dr Lorenda Cahill who collected sputum sample (though I cannot access it in [place]). Dr. Lorenda Cahill felt upon review of her films that her pulmonary disease was amenable to combined medical and surgical approach with resection of large cavitary areas from lungs to be considered by Dr. Elenor Quinones at [facility].   In the interm she grew Nocardia species from sputum and has been started on zyvox which she was tolerating  But then developed nose blleeds.  She ultimately underwent surgery at [facility] on 09/27/14 with bronchoscopy, thoracoscopy with removal of 2 lobes of lung. She was changed from zyvox to rocephin (to which her Norardia was S) and has remained on this with plan of 6 weeks of IV abx. It is believed that the infected part of lung was removed with surgery. She also grew M avium from prior culture from Dr.Stout's office.  Her cough had DRAMATICALLY improved postoperatively but she did develop significant postoperative subcutaneous emphysema. She had resumed her azithromycin and ETH. She has had some dysphagia due to subcutaneous emphysema but this is improving.  A few visits since then she  developed NEW complaints of acute left toe pain, with pain in her left eye and decreased visual acuity, along with malaise, nausea and reduced hearing.She stopped EThambutol and with our advise also the azithromycin to avoid monotherapy for her M avium.   WE had in the interim considered procuring clofazamine. Dr. Brigitte Pulse at [facility] corresponded with me and was very skeptical that the patients eye pain was due  to [facility] and recommended rechallenge with [facility] at 4m/kg TIW along with continued Azithro first.   Unfortunately when she restarted those meds she began experiencing eye pain, joint pain and palpitations and [place] all f these meds [place].  She CLAIMED at one of her more recent visits [place]  [id number] was confirmation of the azithromycin causing her to have QT prolongation and myocarditis.  I see that she did have an EKG on January 06, 2016 which showed frequent PVCs but I do not see changes consistent with myocarditis.  Furthermore her QTC interval was similar in August 2018 when she was off of medications.   I had not seen her since that visit in the December 2017.  In the interim she has had worsening weight loss which she partly attributes to IBS.  She also though has had worsening cough at times with hemoptysis.  Imaging including CT scan of the lungs was done recently which shows November 2018:  1.  Progressive bronchiectasis involving the right lower lung with areas of cylindrical and cystic bronchiectasis. Patchy airspace nodules and endobronchial debris/ mucous plugging possibly related to a bronchiectasis related opportunistic infection. 2. Patchy areas of tree-in-bud appearance suggesting chronic inflammation or MAC. 3. No focal airspace consolidation, pulmonary edema or worrisome pulmonary mass. 4. Chronic right-sided pleural thickening and loculated fluid.  She is referred back to [place] by Dr. Annamaria Boots and she has yet again unsurprisingly growing Mycobacterium avium from her sputum cultures.   We have worked to obtain inhaled amikacin + clofazimine and azithromycin to come up with a salvage regimen given her intolerances of different meds.  She has her INH amikacin and would like 3 month supply of azithro as is cheaper for her. We do NOT yet have clofazimine.  She is currently having daily severe cough that brings up copious sputum and blood tinged material at  times. She has lost weight , has poor appetite and frequent sore throat. She has had problems with dizziness as well. She has issues with bloating and  Urge to defecate and being worked up for IBS and being considered for endoscopy  Past Medical History:  Diagnosis Date  . Acute left eye pain 11/15/2014  . Adverse effect of general anesthetic    DELERIUM  . ALLERGIC RHINITIS   . Anxiety   . Arthritis   . Bronchiectasis ([place])   . Cancer ([place])    basal cell skin cancer removed  . Chronic back pain   . Complication of anesthesia    s/p lung surgery at [facility] X 2 MONTHS  . Diverticulosis   . Dysphagia 10/13/2014  . Hydropneumothorax 11/15/2014  . Hypertension   . Hyponatremia 09/15/2014  . Internal hemorrhoids   . Irritable bowel syndrome with constipation   . Mycobacterium avium-intracellulare infection ([place])   . 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 Osteoporosis   . Stroke ([place])    TIA  SEVERAL YEARS AGO X 1 (50YRS -30 YRS AGO)  . Subcutaneous emphysema ([place]) 10/13/2014  . Toe pain 11/15/2014   Past Surgical History:  Procedure Laterality Date  . BREAST BIOPSY    . BREAST LUMPECTOMY WITH RADIOACTIVE SEED LOCALIZATION Left 10/05/2015   Procedure: LEFT BREAST LUMPECTOMY WITH RADIOACTIVE SEED LOCALIZATION;  Surgeon: Excell Seltzer, MD;  Location: [employer];  Service: General;  Laterality: Left;  . CATARACT EXTRACTION W/ INTRAOCULAR LENS  IMPLANT, BILATERAL    . CHOLECYSTECTOMY    . LAMINECTOMY     cervical   . VAGINAL HYSTERECTOMY     Family History  Problem Relation Age of Onset  . Breast cancer Mother   . Stroke Father   . Pulmonary embolism Brother    Social History   Tobacco Use  . Smoking status: Never Smoker  . Smokeless tobacco: Never Used  Substance Use Topics  . Alcohol use: No  . Drug use: No     Review of Systems  Constitutional: Positive for appetite change and  unexpected weight change. Negative for activity change, diaphoresis and fatigue.  HENT: Negative for congestion, sinus pressure and sneezing.   Eyes: Negative for photophobia and visual disturbance.  Respiratory: Positive for cough and shortness of breath. Negative for stridor.   Cardiovascular: Negative for palpitations and leg swelling.  Gastrointestinal: Positive for abdominal distention, abdominal pain and diarrhea. Negative for anal bleeding, blood in stool, constipation and vomiting.  Genitourinary: Negative for difficulty urinating, dysuria,  flank pain and hematuria.  Musculoskeletal: Negative for arthralgias, back pain, gait problem and joint swelling.  Skin: Negative for color change, pallor and wound.  Neurological: Positive for dizziness. Negative for tremors, weakness and light-headedness.  Hematological: Negative for adenopathy. Does not bruise/bleed easily.  Psychiatric/Behavioral: Negative for agitation, behavioral problems, decreased concentration, dysphoric mood and sleep disturbance.       Objective:   Physical Exam  Constitutional: She is oriented to person, place, and time. No distress.  HENT:  Head: Normocephalic and atraumatic.  Mouth/Throat: No oropharyngeal exudate.  Eyes: Conjunctivae and EOM are normal. No scleral icterus.  Neck: Normal range of motion. Neck supple.  Cardiovascular: Normal rate, regular rhythm and normal heart sounds. Exam reveals no gallop and no friction rub.  No murmur heard. Pulmonary/Chest: Effort normal. No respiratory distress. She has decreased breath sounds in the right lower field. She has no rhonchi.    Crackles at bases, prolonged expiratory phase  Dullness to percussion right base  Abdominal: She exhibits no distension.  Musculoskeletal: She exhibits no edema or tenderness.  Neurological: She is alert and oriented to person, place, and time. She exhibits normal muscle tone. Coordination normal.  Skin: Skin is warm and dry. No  rash noted. She is not diaphoretic. No erythema. No pallor.  Psychiatric: Her behavior is normal. Judgment and thought content normal. Her mood appears anxious.  Nursing note and vitals reviewed.         Assessment & Plan:    #1  Mycobacterium Avium Intracellulare infection  sp resection of TWO lobes of lung.  We will sequentially start drugs for her M avium ONCe we have them all  #2 Abnormal EKG : ? Of drug induced vs idiopathic pericarditis, myocarditis she can have serial EKG's checked once we start macrolide   #3 Eye pain on ETH: will not challenge wth this again  #4 HOH would not use systemic amikacin  #4  Bronchiectasis: sp lobectomy she asks about surgery again but I dont think she would be candidate for clear surgical resection at this time. Can refer to CT vs CT at [facility] where she had original operation though she says she was told she could have it here  #5 IBS like symptoms: re risk of this would ask her to discuss with pulmonologist risk of anesthesia for procedure.  Antibiotics may worsen her GI symptoms though I think we DO NEED to try to treat her M avium again given weight loss, hemoptysis and poor appetite, many of these we could improve with systemic symptoms.   I spent greater than 25 minutes with the patient including greater than 50% of time in face to face counsel of the patient and her husband re the different abx we would use, risk benefits to each, re her IBS symptoms and then further concerns re IRB  and in coordination of her care w ID pharmacy.

## 2017-03-19 ENCOUNTER — Other Ambulatory Visit: Payer: Self-pay | Admitting: Pharmacist

## 2017-03-20 ENCOUNTER — Other Ambulatory Visit: Payer: Self-pay | Admitting: Pharmacist Clinician (PhC)/ Clinical Pharmacy Specialist

## 2017-03-20 MED ORDER — AZITHROMYCIN 250 MG PO TABS: 250.0000 mg | ORAL_TABLET | Freq: Every day | ORAL | 3 refills | Status: DC

## 2017-03-20 NOTE — Progress Notes (Signed)
Transfer the azithromycin to [facility]. She has been approved for clozfazimine and we are just waiting for it to arrived here. Once we get it, we will stagger the start to azith x2 wks>>azith/amikacin x2 wks>>azith/amikacin/clozimine

## 2017-03-21 ENCOUNTER — Telehealth: Payer: Self-pay | Admitting: Pharmacist

## 2017-03-21 NOTE — Telephone Encounter (Signed)
Patient has been approved for clofazimine through [employer]. I sent in drug supply form on Tuesday 3/12. Hopefully will be here in a week or so. Will keep Dr. Tommy Medal and [place] updated.

## 2017-03-21 NOTE — Telephone Encounter (Signed)
Sent in application for clofazimine to [employer]

## 2017-03-22 NOTE — Telephone Encounter (Signed)
Thanks Cassie 

## 2017-04-01 ENCOUNTER — Telehealth: Payer: Self-pay | Admitting: Cardiovascular Disease

## 2017-04-01 ENCOUNTER — Encounter: Payer: Self-pay | Admitting: Cardiovascular Disease

## 2017-04-01 ENCOUNTER — Ambulatory Visit (INDEPENDENT_AMBULATORY_CARE_PROVIDER_SITE_OTHER): Payer: Medicare Other | Admitting: Infectious Disease

## 2017-04-01 VITALS — BP 179/106 | HR 92 | Temp 98.0°F | Ht 59.0 in | Wt 95.0 lb

## 2017-04-01 DIAGNOSIS — Z889 Allergy status to unspecified drugs, medicaments and biological substances status: Secondary | ICD-10-CM | POA: Diagnosis not present

## 2017-04-01 DIAGNOSIS — A310 Pulmonary mycobacterial infection: Secondary | ICD-10-CM

## 2017-04-01 DIAGNOSIS — A319 Mycobacterial infection, unspecified: Secondary | ICD-10-CM | POA: Diagnosis not present

## 2017-04-01 DIAGNOSIS — K581 Irritable bowel syndrome with constipation: Secondary | ICD-10-CM | POA: Diagnosis not present

## 2017-04-01 DIAGNOSIS — R042 Hemoptysis: Secondary | ICD-10-CM | POA: Diagnosis not present

## 2017-04-01 DIAGNOSIS — J471 Bronchiectasis with (acute) exacerbation: Secondary | ICD-10-CM

## 2017-04-01 NOTE — Progress Notes (Signed)
[facility] for Infectious Disease   Chief complaint: Worsening coughing  with hemoptysis weight loss,  dizziness, and poor appetite   Subjective:      Patient ID: Mia Hernandez, female    DOB: 11/03/1940, 77 y.o.   MRN: [id number]  HPI 77 year old lady with past medical history significant for diagnosis of Mycobacterium avium infection of the lungs, recent Nocardia  INTERVAL HiSTORY:     Started on therapy with clarithromycin rifampin and ethambutol 3 days per week. She had great difficulty tolerating the Biaxin which was she was taking it twice daily doses on the day she was taking it was reduced to once daily dosing on July 01 2012 Apparently at that time there was concern about toxicity although she states that now that she was found to need simply new hearing aids. Initially while on therapy for Mycobacterium avium showed mprovement in her symptoms of chronic daily cough and sputum production.   In Fall of 2013 while [facility] 2013 apparently she had episodes of hemoptysis and was hospitalized due to Hospital in [place] where she was placed in isolation for rule out tuberculosis. Ultimately was realized that she was suffering from Mycobacterium avium infection alone.  She had repeat sputum taken Spring of 2014 and again grow Mycobacterium avium. The organism was sent for susceptibility testing and showed macrolide sensitivity and in vitro synergy with rifampin and ETH in inhibiting growth in lab. Amikacin would also be considered active at S she had.  I had Changed her to  daily therapy with macrolide (Azithromycin) ethambutol and rifampin. Since change she had been initiallycoughing less, she was gaining weight and was  absent fevers.   She saw Dr. Annamaria Boots in November 2014 when she reported 1 month she is coughing up more yellow/liquid phlem since on Rafampin. Chest x-ray was done and showed no changes. Sputum was sent for routine culture which only isolated normal oral pharyngeal  flora. AFB culture is without AFB seen on smear blood cultures in November DID grow M avium again   I had tried to get her on daily RIFABUTIN with continue azithro and ETH but she did not tolerate at all with severe anorexia, and apparent visual problems, stating that "I could not see for several hours."  She went back to Rifampin TIW,  azithro 549m daily, ETH 7070mTU, TH< SA< SU  In the interim she was seen by her Cardiologist Dr. CrSallyanne Kusterwho saw her in October and found NEW  deep symmetrical inverted T waves in all the inferior leads as well as V3-V6. The QTC prolongation on EKG that persistd on followup EKG. he was concern for possible antibiotic induced myocarditis and the patient stopped her anti-Mycobacterium drugs and had MRI of her heart performed which was normal echocardiogram was also normal.  Repeat EKG done in the next few days showed resolution of T wave changes and improvement in QT  Since coming off her anti-Mycobacterium avium drugs she had continued to cough on a daily basis   She had CXR that showed expansion of cavitary disease though most recent CXR showed stable findings.   Note her M avium was R to Moxifloxacin with MIC of 4.  It was I to linezolid with MIC of 16  It was S to clarithro with MIC of 2  Further MICs were:  Amikacin 16 Cipro 16 Ethambutol 8 Ethimodate > INH 8 Rifampin >8 Rifabutin 0.5 Streptomcin 32  Combination of ETH with clarithro, erythrom, rifampin, rifabutin all  produced no growth in vitro  See below      She came to our clinic multiple times in  Spring 2016 with worsening cough, with blood tinged sputum and actual hemoptysis, fatigue, lack of energy.  I corresponded with Dr Lorenda Cahill via email and he recommended not starting any therapy until he had a chance to review her case himself when she comes to visit him. She is on schedule to see him in August.  She was worked into clinic before seeing Dr. Lorenda Cahill with  worsening blood  tinged sputum and we ultimately decided upon consultation with Dr Lorenda Cahill and with Dr Sallyanne Kuster to re-challenge the patient with Azithro and [facility] which we did and there have been no EKG changes so far to suggest pericarditis , myocarditis or issues with QT prolongation.  Since she started on meds she has had improvement in sputum, no longer blood tinged and she is now able to sleep.  She was then seen  Dr Lorenda Cahill who collected sputum sample (though I cannot access it in [place]). Dr. Lorenda Cahill felt upon review of her films that her pulmonary disease was amenable to combined medical and surgical approach with resection of large cavitary areas from lungs to be considered by Dr. Elenor Quinones at [facility].   In the interm she grew Nocardia species from sputum and has been started on zyvox which she was tolerating  But then developed nose blleeds.  She ultimately underwent surgery at [facility] on 09/27/14 with bronchoscopy, thoracoscopy with removal of 2 lobes of lung. She was changed from zyvox to rocephin (to which her Norardia was S) and has remained on this with plan of 6 weeks of IV abx. It is believed that the infected part of lung was removed with surgery. She also grew M avium from prior culture from Dr.Stout's office.  Her cough had DRAMATICALLY improved postoperatively but she did develop significant postoperative subcutaneous emphysema. She had resumed her azithromycin and ETH. She has had some dysphagia due to subcutaneous emphysema but this is improving.  A few visits since then she  developed NEW complaints of acute left toe pain, with pain in her left eye and decreased visual acuity, along with malaise, nausea and reduced hearing.She stopped EThambutol and with our advise also the azithromycin to avoid monotherapy for her M avium.   WE had in the interim considered procuring clofazamine. Dr. Brigitte Pulse at [facility] corresponded with me and was very skeptical that the patients eye pain was due to [facility] and  recommended rechallenge with [facility] at 3m/kg TIW along with continued Azithro first.   Unfortunately when she restarted those meds she began experiencing eye pain, joint pain and palpitations and stopped all f these meds again in [place] 2017.  She CLAIMED at one of her more recent visits [place]  [id number] was confirmation of the azithromycin causing her to have QT prolongation and myocarditis.  I see that she did have an EKG on January 06, 2016 which showed frequent PVCs but I do not see changes consistent with myocarditis.  Furthermore her QTC interval was similar in August 2018 when she was off of medications.   I had not seen her since that visit in the December 2017.  In the interim she has had worsening weight loss which she partly attributes to IBS.  She also though has had worsening cough at times with hemoptysis.  Imaging including CT scan of the lungs was done recently which shows November 2018:  1. Progressive  bronchiectasis involving the right lower lung with areas of cylindrical and cystic bronchiectasis. Patchy airspace nodules and endobronchial debris/ mucous plugging possibly related to a bronchiectasis related opportunistic infection. 2. Patchy areas of tree-in-bud appearance suggesting chronic inflammation or MAC. 3. No focal airspace consolidation, pulmonary edema or worrisome pulmonary mass. 4. Chronic right-sided pleural thickening and loculated fluid.  She is referred back to [place] by Dr. Annamaria Boots and she has yet again unsurprisingly growing Mycobacterium avium from her sputum cultures.   We have worked to obtain inhaled amikacin + clofazimine and azithromycin to come up with a salvage regimen given her intolerances of different meds.  She has her INH amikacin and would like 3 month supply of azithro as is cheaper for her. We do NOT yet have clofazimine.  She has been continuing to have daily severe cough that brings up copious sputum and blood tinged material at  times. She has lost weight , has poor appetite and frequent sore throat. She has had problems with dizziness as well. She has issues with bloating and  Urge to defecate and being worked up for IBS and being considered for endoscopy.  She returns to clinic today after several weeks and continues to cough a great deal including when I was in the room.  Past Medical History:  Diagnosis Date  . Acute left eye pain 11/15/2014  . Adverse effect of general anesthetic    DELERIUM  . ALLERGIC RHINITIS   . Anxiety   . Arthritis   . Bronchiectasis ([place])   . Cancer ([place])    basal cell skin cancer removed  . Chronic back pain   . Complication of anesthesia    s/p lung surgery at [place] X 2 MONTHS  . Diverticulosis   . Dysphagia 10/13/2014  . Hydropneumothorax 11/15/2014  . Hypertension   . Hyponatremia 09/15/2014  . Internal hemorrhoids   . Irritable bowel syndrome with constipation   . Mycobacterium avium-intracellulare infection ([place])   . 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 Osteoporosis   . Stroke ([place])    TIA  SEVERAL YEARS AGO X 1 (50YRS -30 YRS AGO)  . Subcutaneous emphysema ([place]) 10/13/2014  . Toe pain 11/15/2014   Past Surgical History:  Procedure Laterality Date  . BREAST BIOPSY    . BREAST LUMPECTOMY WITH RADIOACTIVE SEED LOCALIZATION Left 10/05/2015   Procedure: LEFT BREAST LUMPECTOMY WITH RADIOACTIVE SEED LOCALIZATION;  Surgeon: Excell Seltzer, MD;  Location: [place];  Service: General;  Laterality: Left;  . CATARACT EXTRACTION W/ INTRAOCULAR LENS  IMPLANT, BILATERAL    . CHOLECYSTECTOMY    . LAMINECTOMY     cervical   . VAGINAL HYSTERECTOMY     Family History  Problem Relation Age of Onset  . Breast cancer Mother   . Stroke Father   . Pulmonary embolism Brother    Social History   Tobacco Use  . Smoking status: Never Smoker  . Smokeless tobacco: Never Used  Substance Use  Topics  . Alcohol use: No  . Drug use: No     Review of Systems  Constitutional: Positive for appetite change and unexpected weight change. Negative for activity change, diaphoresis and fatigue.  HENT: Negative for congestion, sinus pressure and sneezing.   Eyes: Negative for photophobia and visual disturbance.  Respiratory: Positive for cough and shortness of breath. Negative for stridor.   Cardiovascular: Negative for palpitations and leg swelling.  Gastrointestinal: Positive  for abdominal distention, abdominal pain and diarrhea. Negative for anal bleeding, blood in stool, constipation and vomiting.  Genitourinary: Negative for difficulty urinating, dysuria, flank pain and hematuria.  Musculoskeletal: Negative for arthralgias, back pain, gait problem and joint swelling.  Skin: Negative for color change, pallor and wound.  Neurological: Positive for dizziness. Negative for tremors, weakness and light-headedness.  Hematological: Negative for adenopathy. Does not bruise/bleed easily.  Psychiatric/Behavioral: Negative for agitation, behavioral problems, decreased concentration, dysphoric mood and sleep disturbance.       Objective:   Physical Exam  Constitutional: She is oriented to person, place, and time. No distress.  HENT:  Head: Normocephalic and atraumatic.  Mouth/Throat: No oropharyngeal exudate.  Eyes: Conjunctivae and EOM are normal. No scleral icterus.  Neck: Normal range of motion. Neck supple.  Cardiovascular: Normal rate, regular rhythm and normal heart sounds. Exam reveals no gallop and no friction rub.  No murmur heard. Pulmonary/Chest: Effort normal. No respiratory distress. She has decreased breath sounds in the right lower field. She has no rhonchi.    prolonged expiratory phase  Abdominal: She exhibits no distension.  Musculoskeletal: She exhibits no edema or tenderness.  Neurological: She is alert and oriented to person, place, and time. She exhibits normal  muscle tone. Coordination normal.  Skin: Skin is warm and dry. No rash noted. She is not diaphoretic. No erythema. No pallor.  Psychiatric: Her behavior is normal. Judgment and thought content normal. Her mood appears anxious.  Nursing note and vitals reviewed.         Assessment & Plan:    #1  Mycobacterium Avium Intracellulare infection  sp resection of TWO lobes of lung.  We will sequentially start drugs for her M avium ONCe  With   Azithromycin x 2 weeks  Then clofazamine that we have obtained from [facility] x 2 weeks  Then   Inhaled amikacin  She will have serial EKGs on azithromycin  #2 Abnormal EKG : ? Of drug induced vs idiopathic pericarditis, myocarditis she can have serial EKG's checked once we start macrolide   #4 HOH would not use systemic amikacin, there is indeed some risk of absorption into systemic system from inhaled but we dont have any other options given organism an dher intolerances.   #4  Bronchiectasis: sp lobectomy she asks about surgery   We will refer to CT surgery though I doubt they will operate. Ialso had to remphasize that while this condition CAN be partly managed with surgery it is not typically managed with surgery unless there are certain parts of the lung that are amenable to surgery.  #5 IBS like symptoms: re risk of this would ask her to discuss with pulmonologist risk of anesthesia for procedure.  Antibiotics may worsen her GI symptoms though I think we DO NEED to try to treat her M avium again given weight loss, hemoptysis and poor appetite, many of these we could improve with systemic symptoms.    I spent greater than 25 minutes with the patient including greater than 50% of time in face to face counsel of the patient and her husband re nature of M avium, need to try to treat it and how we would proceed with sequential antibiotics, re her prior hx of myocarditis (which I believe was likely viral and not drug induced) and in coordination of  her care.

## 2017-04-01 NOTE — Telephone Encounter (Signed)
Pt c/o medication issue:  1. Name of Medication: Azithromycin  2. How are you currently taking this medication (dosage and times per day)? 250 MG // 1x a day   3. Are you having a reaction (difficulty breathing--STAT)? No    4. What is your medication issue? Patient started the medication today and states that previously Dr. Sallyanne Kuster performed an EKG to make sure that patient's heart was not being affected by the medication. Patient started the medication again for a lung disease and is calling back to let Dr. Sallyanne Kuster know, to see when she can have EKG.

## 2017-04-01 NOTE — Telephone Encounter (Signed)
Spoke with patient and she started Azithromycin 250 mg daily today. Per Dr Lowry Ram message ok to get follow up EKG  Mrs. Mia Hernandez,  I went over all my records to make sure I am giving you the correct advice.  When you initially developed the cardiac complications in the fall of 2015, you were taking 3 antibiotics: azithromycin, ethambutol and rifampin. We stopped all of them and the heart situation improved.  When we re-challenged you with azithromycin in 2016, we monitored your ECG frequently and did not see any problems on the ECG, checked repeatedly. In December 2016 you stopped the meds due to palpitations, but there was no objective evidence of a recurrent heart problem.  I do not think there is a cardiac reason to avoid azithromycin. I don't recall telling you not to ever take that particular drug, but I cannot exclude that Dr. Lorenda Cahill or someone else may have given that advice.  Consequently, I think it is OK to take the medications as advised by Dr. Tommy Medal. For extra safety, we can check ECG periodically while on that medication (for example check ECG after 2 weeks on antibiotics and then monthly thereafter). We would be glad to do the ECGs at our [facility] office, but if it is more convenient to have them checked at your PCP or in the ID office, it is very easy to have them scanned in or faxed to me so I can revew them  I hope this helps. Sincerely,  Dr. Loletha Grayer   Scheduled for 04/15/17 on a morning Dr Sallyanne Kuster in the office. Patient aware of date and time.

## 2017-04-01 NOTE — Progress Notes (Signed)
Thanks, Jac Canavan. The saga continues. MCr

## 2017-04-01 NOTE — Telephone Encounter (Signed)
Patient

## 2017-04-01 NOTE — Progress Notes (Signed)
HPI: Mia Hernandez is a 77 y.o. female who is here to f/u with Dr. Tommy Hernandez for her MAC infection.   Allergies: Allergies  Allergen Reactions  . Ethambutol Palpitations    Possible optic neuritis, possible gout attack  . Latex Rash  . Penicillins Hives    Breaks out in hives  . Prednisone Other (See Comments)     "out of mind" state  . Rifabutin Other (See Comments)    Loss of vision and anorexia  . Aspirin   . Betadine [Povidone Iodine]   . Codeine   . Doxycycline     faints  . Iodinated Diagnostic Agents   . 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 Morphine And Related   . Sulfa Antibiotics Other (See Comments)  . Azithromycin Other (See Comments)    Heart rhythm issues-prolonged QT wave    Vitals: Temp: 98 F (36.7 C) (03/25 0931) Temp Source: Oral (03/25 0931) BP: 179/106 (03/25 0931) Pulse Rate: 92 (03/25 0931)  Past Medical History: Past Medical History:  Diagnosis Date  . Acute left eye pain 11/15/2014  . Adverse effect of general anesthetic    DELERIUM  . ALLERGIC RHINITIS   . Anxiety   . Arthritis   . Bronchiectasis ([place])   . Cancer ([place])    basal cell skin cancer removed  . Chronic back pain   . Complication of anesthesia    s/p lung surgery at [place] X 2 MONTHS  . Diverticulosis   . Dysphagia 10/13/2014  . Hydropneumothorax 11/15/2014  . Hypertension   . Hyponatremia 09/15/2014  . Internal hemorrhoids   . Irritable bowel syndrome with constipation   . Mycobacterium avium-intracellulare infection ([place])   . 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 Osteoporosis   . Stroke ([place])    TIA  SEVERAL YEARS AGO X 1 (51YRS -30 YRS AGO)  . Subcutaneous emphysema ([place]) 10/13/2014  . Toe pain 11/15/2014    Social History: Social History   Socioeconomic History  . Marital status: Married    Spouse name: Not on file  . Number of children: Not on file  .  Years of education: Not on file  . Highest education level: Not on file  Occupational History  . Occupation: retired  Scientific laboratory technician  . Financial resource strain: Not on file  . Food insecurity:    Worry: Not on file    Inability: Not on file  . Transportation needs:    Medical: Not on file    Non-medical: Not on file  Tobacco Use  . Smoking status: Never Smoker  . Smokeless tobacco: Never Used  Substance and Sexual Activity  . Alcohol use: No  . Drug use: No  . Sexual activity: Not on file  Lifestyle  . Physical activity:    Days per week: Not on file    Minutes per session: Not on file  . Stress: Not on file  Relationships  . Social connections:    Talks on phone: Not on file    Gets together: Not on file    Attends religious service: Not on file    Active member of club or organization: Not on file    Attends meetings of clubs or organizations: Not on file    Relationship status: Not on file  Other Topics Concern  . Not on file  Social History Narrative  . Not  on file    Previous Regimen: Azithromycin/ethambutol/rifampin -  rifabutin  Current Regimen: None  Labs: No results found for: HIV1RNAQUANT, HIV1RNAVL, CD4TABS, HEPBSAB, HEPBSAG, HCVAB  CrCl: CrCl cannot be calculated (Patient's most recent lab result is older than the maximum 21 days allowed.).  Lipids:    Component Value Date/Time   CHOL 187 11/01/2016 1127   TRIG 52.0 11/01/2016 1127   HDL 81.00 11/01/2016 1127   CHOLHDL 2 11/01/2016 1127   VLDL 10.4 11/01/2016 1127   LDLCALC 96 11/01/2016 1127   Note her M avium was R to Moxifloxacin with MIC of 4.  It was I to linezolid with MIC of 16  It was S to clarithro with MIC of 2  Further MICs were:  Amikacin 16 Cipro 16 Ethambutol 8 Ethimodate > INH 8 Rifampin >8 Rifabutin 0.5 Streptomcin 32  Assessment: Mia Hernandez is here to discuss her treatment of her pulmonary MAC. She will be on Azith/liposomal amikacin/clofazimine. She has been  approved for her amikacin and [facility] pharmacy has sent her supply. We just received her clofazimine from [facility]. This will need to be refilled every 3 months through them. Advised her that she needs to let us know about 1 month ahead of time.   We are going to stagger her restart of meds.   Recommendations:  Start azithromycin 284m qday x2 wks>>clofazimine 1053mqday/azith x2 wks>>Amikacin/azith/clofazimine daily  Mia BoerPharmD, BCPS, AAHIVP, CPP Clinical Infectious [facility] Infect[facility] 04/01/2017, 10:57 AM

## 2017-04-02 ENCOUNTER — Encounter: Payer: Self-pay | Admitting: Cardiovascular Disease

## 2017-04-02 ENCOUNTER — Other Ambulatory Visit (INDEPENDENT_AMBULATORY_CARE_PROVIDER_SITE_OTHER): Payer: Medicare Other

## 2017-04-02 ENCOUNTER — Ambulatory Visit (INDEPENDENT_AMBULATORY_CARE_PROVIDER_SITE_OTHER): Payer: Medicare Other | Admitting: Internal Medicine

## 2017-04-02 ENCOUNTER — Encounter: Payer: Self-pay | Admitting: Internal Medicine

## 2017-04-02 DIAGNOSIS — J471 Bronchiectasis with (acute) exacerbation: Secondary | ICD-10-CM | POA: Diagnosis not present

## 2017-04-02 DIAGNOSIS — A310 Pulmonary mycobacterial infection: Secondary | ICD-10-CM | POA: Diagnosis not present

## 2017-04-02 DIAGNOSIS — I514 Myocarditis, unspecified: Secondary | ICD-10-CM | POA: Diagnosis not present

## 2017-04-02 LAB — BASIC METABOLIC PANEL
BUN: 6 mg/dL (ref 6–23)
CALCIUM: 9.7 mg/dL (ref 8.4–10.5)
CHLORIDE: 92 meq/L — AB (ref 96–112)
CO2: 32 mEq/L (ref 19–32)
CREATININE: 0.44 mg/dL (ref 0.40–1.20)
GFR: 147.2 mL/min (ref >60.00)
Glucose, Bld: 109 mg/dL — ABNORMAL HIGH (ref 70–99)
Potassium: 4.4 mEq/L (ref 3.5–5.1)
Sodium: 130 mEq/L — ABNORMAL LOW (ref 135–145)

## 2017-04-02 LAB — CBC WITH DIFFERENTIAL/PLATELET
BASOS ABS: 0.1 10*3/uL (ref 0.0–0.1)
Basophils Relative: 0.8 % (ref 0.0–3.0)
EOS ABS: 0.3 10*3/uL (ref 0.0–0.7)
Eosinophils Relative: 2.7 % (ref 0.0–5.0)
HEMATOCRIT: 38.3 % (ref 36.0–46.0)
HEMOGLOBIN: 13 g/dL (ref 12.0–15.0)
LYMPHS PCT: 12.5 % (ref 12.0–46.0)
Lymphs Abs: 1.2 10*3/uL (ref 0.7–4.0)
MCHC: 34 g/dL (ref 30.0–36.0)
MCV: 89.7 fl (ref 78.0–100.0)
MONO ABS: 1 10*3/uL (ref 0.1–1.0)
Monocytes Relative: 10.5 % (ref 3.0–12.0)
Neutro Abs: 6.9 10*3/uL (ref 1.4–7.7)
Neutrophils Relative %: 73.5 % (ref 43.0–77.0)
Platelets: 362 10*3/uL (ref 150.0–400.0)
RBC: 4.27 Mil/uL (ref 3.87–5.11)
RDW: 14.3 % (ref 11.5–15.5)
WBC: 9.4 10*3/uL (ref 4.0–10.5)

## 2017-04-02 MED ORDER — IRBESARTAN 150 MG PO TABS: 150.0000 mg | ORAL_TABLET | Freq: Every day | ORAL | 1 refills | Status: DC

## 2017-04-02 NOTE — Assessment & Plan Note (Signed)
ID is restarting Zithromax and after 2 weeks plan is for cardiology to recheck EKG for evidence of recurrent myocarditis.

## 2017-04-02 NOTE — Patient Instructions (Signed)
Order- lab- CBC w diff, BMET  Order- Walk test- O2 qualifying   Dx Bronchiectasis, hemoptysis  Order- Schedule ONOX on room air  Order- schedule CT chest High Resolution, no contrast    Dx Bronchiectasis

## 2017-04-02 NOTE — Assessment & Plan Note (Signed)
Now beginning to put together a regimen of Zithromax, aerosolized amikacin, clofazimine supervised by Infectious Disease.

## 2017-04-02 NOTE — Assessment & Plan Note (Signed)
Severe bronchiectasis with intermittent hemoptysis and history of prior right lower lung resection 2016 for cavity. Plan-CT chest

## 2017-04-02 NOTE — Progress Notes (Signed)
HPI female never smoker followed for cough, bronchiectasis/MAIC , obstructive airway disease, history of myocarditis. Cavitary lesion with history of hemoptysis resected/ RML/RLL lobectomy 09/2014 , right pleural effusion, left breast cancer/seeds  +MAIC/ triple therapy begun 06/18/11- dc'd 2015 by Cardiology due to myocarditis ? Due to zith?.   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 FVC 1.76/77%, FEV1 1.12/65%, ratio 0.63, FEF 25-75% 0.59/42%. Walk test 04/02/17 on room air-lowest saturation 92% ----------------------------------------------------------------------------------------- 01/14/17- 77 year old female never smoker followed for cough, bronchiectasis, obstructive airway disease , history of myocarditis Cavitary lesion with history of hemoptysis resected, RML/RLL lobectomy 09/2014 for bronchiectasis, right pleural effusion, left breast cancer/seeds/partial mastect. +MAIC/ triple therapy began 06/18/11- dc'd 2015 by Cardiology due to myocarditis ? Due to zith?. Sputum Cx 12/25/16-  +  MAIC ---Bronchiectasis; Pt states she is increased SOB( worse than ever been-with exertion). Fatigued after activity as well. Pt stopped all meds per phone note.  Cough-productive-white to yellow/green in color. Runny nose. Denies any fever or chills.  Feels tired, a little queasy-had to stop second round of doxycycline a little early. We reviewed chest x-ray comparing December to March with definite increase right midlung zone density, looking similar to the way her original right lower lobe abscess started.  There is chronic elevation of the right hemidiaphragm with some effusion, since right lower lobectomy. CXR 01/04/17-  IMPRESSION: Stable chronic changes in the right lung and moderate  right pleural Effusion  04/02/17- 77 year old female never smoker followed for cough, bronchiectasis, obstructive airway disease , history of myocarditis Cavitary lesion with history of hemoptysis resected, RML/RLL lobectomy 09/2014 for bronchiectasis, right pleural effusion, left breast cancer/seeds/partial mastect. +MAIC/ triple therapy began 06/18/11- dc'd 2015 by Cardiology due to myocarditis ? Due to zith?. Sputum Cx 12/25/16-  +  MAIC MAIC: Pt states she is doing pretty bad with her breathing. Increased SOB than usual, more weakness, lightheaded often and has passed out several times- a couple of times after getting out of shower-warm water.  Saw Dr Tommy Medal yesterday-now plan Azith/ liposomal amikacin/ clofazimine for [facility] with intermittent hemoptysis. Starting Zithromax first-cardiology to recheck EKG in 2 weeks because of previous attribution of myocarditis. More aware of DOE over the last couple of months with no acute event.  Still occasional hemoptysis-teaspoon red blood this morning.  Denies fever, sweats, chest pain. Walk test 04/02/17 on room air-lowest saturation 92%  ROS- see HPI    + = pos Constitutional:   No-   weight loss, night sweats, fevers, chills, fatigue, lassitude. HEENT:   No-  headaches, difficulty swallowing, tooth/dental problems, sore throat,       No-  sneezing, itching, ear ache, nasal congestion, + post nasal drip,  CV:  No-   chest pain, orthopnea, PND, swelling in lower extremities, anasarca, dizziness, palpitations Resp: +  shortness of breath with exertion or at rest.              +  productive cough,  little non-productive cough,  + coughing up of blood.              +  change in color of mucus.  No- wheezing.   Skin: No-   rash or lesions. GI:  No-   heartburn, indigestion, abdominal pain, nausea, vomiting,  GU:  MS:  No-   joint pain or swelling.   Neuro-  nothing unusual Psych:  No- change in mood or affect. No depression or anxiety.  No memory  loss.  OBJ- Physical Exam General- Alert, Oriented, Affect-appropriate, Distress- none acute, +slender, +talkative Skin- rash-none, lesions- none, excoriation- none Lymphadenopathy- none Head- atraumatic            Eyes- Gross vision intact, PERRLA, conjunctivae and secretions clear            Ears- Hearing aides            Nose- Clear, no-Septal dev, mucus, polyps, erosion, perforation             Throat- Mallampati II , mucosa clear , drainage- none, tonsils- atrophic Neck- flexible , trachea midline, no stridor , thyroid nl, carotid no bruit Chest - symmetrical excursion , unlabored           Heart/CV- +bigeminal pulse , no murmur , no gallop  , no rub, nl s1 s2                           - JVD- none , edema- none, stasis changes- none, varices- none           Lung- + distant sounds right base otherwise clear, cough- none , dullness+ right base, rub- none           Chest wall-  Abd-  Br/ Gen/ Rectal- Not done, not indicated Extrem- cyanosis- none, clubbing, none, atrophy- none, strength- nl Neuro- grossly intact to observation

## 2017-04-04 ENCOUNTER — Ambulatory Visit (INDEPENDENT_AMBULATORY_CARE_PROVIDER_SITE_OTHER)
Admission: RE | Admit: 2017-04-04 | Discharge: 2017-04-04 | Disposition: A | Discharge status: Home / Self Care | Payer: Medicare Other | Source: Ambulatory Visit | Attending: Internal Medicine | Admitting: Internal Medicine

## 2017-04-04 DIAGNOSIS — J471 Bronchiectasis with (acute) exacerbation: Secondary | ICD-10-CM | POA: Diagnosis not present

## 2017-04-04 DIAGNOSIS — R042 Hemoptysis: Secondary | ICD-10-CM | POA: Diagnosis not present

## 2017-04-05 ENCOUNTER — Telehealth: Payer: Self-pay | Admitting: Internal Medicine

## 2017-04-05 NOTE — Telephone Encounter (Signed)
Patient is aware, I will send these to both the PCP and cardiologist.

## 2017-04-09 ENCOUNTER — Encounter: Payer: Self-pay | Admitting: Cardiovascular Disease

## 2017-04-11 DIAGNOSIS — R0902 Hypoxemia: Secondary | ICD-10-CM | POA: Diagnosis not present

## 2017-04-11 DIAGNOSIS — J449 Chronic obstructive pulmonary disease, unspecified: Secondary | ICD-10-CM | POA: Diagnosis not present

## 2017-04-15 ENCOUNTER — Ambulatory Visit (INDEPENDENT_AMBULATORY_CARE_PROVIDER_SITE_OTHER): Payer: Medicare Other | Admitting: *Deleted

## 2017-04-15 DIAGNOSIS — I499 Cardiac arrhythmia, unspecified: Secondary | ICD-10-CM

## 2017-04-15 NOTE — Progress Notes (Signed)
Patient came in for an EKG. She recently started azithromycin and had previously had EKG's while on this medication to check her heart rate and rhytmn.   Per Dr. Sallyanne Kuster, no changes at this time. Patient will come back in 6 weeks for a follow up EKG. Appointment made for 05/29/17.

## 2017-04-24 ENCOUNTER — Other Ambulatory Visit: Payer: Self-pay

## 2017-04-24 ENCOUNTER — Ambulatory Visit (INDEPENDENT_AMBULATORY_CARE_PROVIDER_SITE_OTHER): Payer: Medicare Other | Admitting: Family Medicine

## 2017-04-24 ENCOUNTER — Encounter: Payer: Self-pay | Admitting: Family Medicine

## 2017-04-24 VITALS — BP 128/81 | HR 79 | Temp 97.9°F | Resp 16 | Ht 60.0 in | Wt 93.4 lb

## 2017-04-24 DIAGNOSIS — A310 Pulmonary mycobacterial infection: Secondary | ICD-10-CM

## 2017-04-24 DIAGNOSIS — E871 Hypo-osmolality and hyponatremia: Secondary | ICD-10-CM | POA: Diagnosis not present

## 2017-04-24 DIAGNOSIS — I10 Essential (primary) hypertension: Secondary | ICD-10-CM | POA: Diagnosis not present

## 2017-04-24 LAB — BASIC METABOLIC PANEL
BUN: 9 mg/dL (ref 6–23)
CALCIUM: 9.2 mg/dL (ref 8.4–10.5)
CO2: 31 meq/L (ref 19–32)
CREATININE: 0.53 mg/dL (ref 0.40–1.20)
Chloride: 91 mEq/L — ABNORMAL LOW (ref 96–112)
GFR: 118.74 mL/min (ref >60.00)
GLUCOSE: 90 mg/dL (ref 70–99)
Potassium: 4.1 mEq/L (ref 3.5–5.1)
SODIUM: 129 meq/L — AB (ref 135–145)

## 2017-04-24 NOTE — Assessment & Plan Note (Signed)
New.  Pt's recent Na is 130.  She is asymptomatic.  Was told to liberalize salt.  Repeat labs and trend levels

## 2017-04-24 NOTE — Progress Notes (Signed)
   Subjective:    Patient ID: Mia Hernandez, female    DOB: 25-Mar-1940, 77 y.o.   MRN: [id number]  HPI HTN- chronic problem, on Avapro 174m daily.  Denies CP.  Chronic SOB w/ chronic cough.  Hyponatremia- Na was 130 when Pulm checked this last month.  Encouraged her to liberalize her salt intake.  MAC/Bronchiectasis- pt is following w/ ID and pulmonary.  Starting Amikacin on Monday.  [place]  Has been on Azithro x1 month w/o improvement.  They are considering surgical resection of additional pieces of R lung.   Review of Systems For ROS see HPI     Objective:   Physical Exam  Constitutional: She is oriented to person, place, and time. She appears well-developed and well-nourished. No distress.  HENT:  Head: Normocephalic and atraumatic.  Eyes: Pupils are equal, round, and reactive to light. Conjunctivae and EOM are normal.  Neck: Normal range of motion. Neck supple. No thyromegaly present.  Cardiovascular: Normal rate, regular rhythm, normal heart sounds and intact distal pulses.  No murmur heard. Pulmonary/Chest: Effort normal. No respiratory distress.  Crackles in RUL, absent BS in RLL Normal BS on left  Abdominal: Soft. She exhibits no distension. There is no tenderness.  Musculoskeletal: She exhibits no edema.  Lymphadenopathy:    She has no cervical adenopathy.  Neurological: She is alert and oriented to person, place, and time.  Skin: Skin is warm and dry.  Psychiatric: She has a normal mood and affect. Her behavior is normal.  Vitals reviewed.         Assessment & Plan:

## 2017-04-24 NOTE — Patient Instructions (Signed)
Follow up in 6 months to recheck BP We'll notify you of your lab results and make any changes if needed Make sure you are eating regularly!!!  This is very important Call with any questions or concerns [place]!!!

## 2017-04-24 NOTE — Assessment & Plan Note (Signed)
Following w/ Dr Drucilla Schmidt- starting Amkiacin on Monday and is on an experimental tx from [place].  Will follow along.

## 2017-04-24 NOTE — Assessment & Plan Note (Signed)
Chronic problem.  Adequate control.  Reviewed recent labs.  Na was low so will repeat BMP.  Pt expressed understanding and is in agreement w/ plan.

## 2017-05-02 ENCOUNTER — Telehealth: Payer: Self-pay | Admitting: Pharmacist Clinician (PhC)/ Clinical Pharmacy Specialist

## 2017-05-02 NOTE — Telephone Encounter (Signed)
Arikares just letting us know that Ladoris started her Arikace today (04/30/17)

## 2017-05-06 ENCOUNTER — Ambulatory Visit (INDEPENDENT_AMBULATORY_CARE_PROVIDER_SITE_OTHER): Payer: Medicare Other | Admitting: Internal Medicine

## 2017-05-06 ENCOUNTER — Encounter: Payer: Self-pay | Admitting: Internal Medicine

## 2017-05-06 DIAGNOSIS — I514 Myocarditis, unspecified: Secondary | ICD-10-CM | POA: Diagnosis not present

## 2017-05-06 DIAGNOSIS — A310 Pulmonary mycobacterial infection: Secondary | ICD-10-CM

## 2017-05-06 MED ORDER — BENZONATATE 100 MG PO CAPS: ORAL_CAPSULE | ORAL | 3 refills | Status: DC

## 2017-05-06 MED ORDER — BENZONATATE 200 MG PO CAPS: 200.0000 mg | ORAL_CAPSULE | Freq: Three times a day (TID) | ORAL | 1 refills | Status: DC | PRN

## 2017-05-06 NOTE — Patient Instructions (Signed)
Script sent for benzonatate perles for cough.   Good luck with your new medicines '  We will keep the July appointment

## 2017-05-06 NOTE — Progress Notes (Signed)
HPI female never smoker followed for cough, bronchiectasis/MAIC , obstructive airway disease, history of myocarditis. Cavitary lesion with history of hemoptysis resected/ RML/RLL lobectomy 09/2014 , right pleural effusion, left breast cancer/seeds  +MAIC/ triple therapy begun 06/18/11- dc'd 2015 by Cardiology due to myocarditis ? Due to zith?.   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 FVC 1.76/77%, FEV1 1.12/65%, ratio 0.63, FEF 25-75% 0.59/42%. Walk test 04/02/17 on room air-lowest saturation 92% Began zith/clofazimine/ Arikayce March, 2019. -----------------------------------------------------------------------------------------  04/02/17- 77 year old female never smoker followed for cough, bronchiectasis, obstructive airway disease , history of myocarditis Cavitary lesion with history of hemoptysis resected, RML/RLL lobectomy 09/2014 for bronchiectasis, right pleural effusion, left breast cancer/seeds/partial mastect. +MAIC/ triple therapy began 06/18/11- dc'd 2015 by Cardiology due to myocarditis ? Due to zith?. Sputum Cx 12/25/16-  +  MAIC MAIC: Pt states she is doing pretty bad with her breathing. Increased SOB than usual, more weakness, lightheaded often and has passed out several times- a couple of times after getting out of shower-warm water.  Saw Dr Tommy Medal yesterday-now plan Azith/ liposomal amikacin/ clofazimine for [facility] with intermittent hemoptysis. Starting Zithromax first-cardiology to recheck EKG in 2 weeks because of previous attribution of myocarditis. More aware of DOE over the last couple of months with no acute event.  Still occasional hemoptysis-teaspoon red blood this morning.  Denies fever, sweats, chest pain. Walk test 04/02/17 on room air-lowest  saturation 92%  05/06/2017- 77 year old female never smoker followed for cough, bronchiectasis, obstructive airway disease , history of myocarditis Cavitary lesion with history of hemoptysis resected, RML/RLL lobectomy 09/2014 for bronchiectasis, right pleural effusion, left breast cancer/seeds/partial mastect. +MAIC/ triple therapy began 06/18/11- dc'd 2015 by Cardiology due to myocarditis ? Due to zith?. Sputum Cx 12/25/16-  +  MAIC ----Patient has started on Amikacin and Clofazimine (Arycase). Patient has a non-productive cough.  Being supervised by ID-she has now started Zithromax (being monitored by cardiology for potential myositis),clofazimien, Arikayce. About 3 days after starting Arikayce, she began noting active dry cough-a known side effect.  She is willing to continue.  She is intolerant of "morphine and relatives" but is willing to try benzonatate for cough. Has an audiologist to keep track for hearing.  Needs renal function followed. CXR 04/04/2017 IMPRESSION: 1. Status post right middle and lower lobectomy with compensatory hyperexpansion of the right upper lobe which is extensively affected by bronchiectasis and widespread areas of mucoid impaction. Overall, the appearance is very similar to prior study 11/28/2016. 2. Aortic atherosclerosis, in addition to left anterior descending coronary artery disease. Assessment for potential risk factor modification, dietary therapy or pharmacologic therapy may be warranted, if clinically indicated. 3. 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 Recommend annual imaging followup by CTA or MRA. This recommendation follows 2010 ACCF/AHA/AATS/ACR/ASA/SCA/SCAI/SIR/STS/SVM Guidelines for the Diagnosis and Management of Patients with Thoracic Aortic Disease. Circulation. 2010; 121: W413-K440. 4. Additional incidental findings, as above.  ROS- see HPI    + = pos Constitutional:   No-   weight loss, night sweats, fevers,  chills, fatigue, lassitude. HEENT:   No-  headaches, difficulty swallowing, tooth/dental problems, sore throat,       No-  sneezing, itching, ear ache, nasal congestion, + post nasal drip,  CV:  No-   chest pain, orthopnea, PND, swelling in lower extremities, anasarca, dizziness, palpitations Resp: +  shortness of breath with  exertion or at rest.              +  productive cough,  little non-productive cough,  + coughing up of blood.                change in color of mucus.  No- wheezing.   Skin: No-   rash or lesions. GI:  No-   heartburn, indigestion, abdominal pain, nausea, vomiting,  GU:  MS:  No-   joint pain or swelling.   Neuro-     nothing unusual Psych:  No- change in mood or affect. No depression or anxiety.  No memory loss.  OBJ- Physical Exam    +She looks the same and is not coughing significantly at this visit General- Alert, Oriented, Affect-appropriate, Distress- none acute, +slender, +talkative Skin- rash-none, lesions- none, excoriation- none Lymphadenopathy- none Head- atraumatic            Eyes- Gross vision intact, PERRLA, conjunctivae and secretions clear            Ears- Hearing aides            Nose- Clear, no-Septal dev, mucus, polyps, erosion, perforation             Throat- Mallampati II , mucosa clear , drainage- none, tonsils- atrophic Neck- flexible , trachea midline, no stridor , thyroid nl, carotid no bruit Chest - symmetrical excursion , unlabored           Heart/CV- +bigeminal pulse , no murmur , no gallop  , no rub, nl s1 s2                           - JVD- none , edema- none, stasis changes- none, varices- none           Lung- + distant sounds right base otherwise clear, cough- none , dullness+ right base, rub- none           Chest wall-  Abd-  Br/ Gen/ Rectal- Not done, not indicated Extrem- cyanosis- none, clubbing, none, atrophy- none, strength- nl Neuro- grossly intact to observation

## 2017-05-07 NOTE — Assessment & Plan Note (Signed)
She is trying to get her with Zithromax under cardiology supervision

## 2017-05-07 NOTE — Assessment & Plan Note (Signed)
She describes significant dry cough that is concerning but tolerable, and associates with beginning Arikayce. Plan-try benzonatate Perles to improve tolerance, hoping she can sustain use of this medicine long enough to do her some good.

## 2017-05-14 ENCOUNTER — Ambulatory Visit: Payer: Self-pay

## 2017-05-14 NOTE — Telephone Encounter (Signed)
Pt calling with vaginal itching on the outside of the left labia majora. Pt stated that the itching began on Sunday. She stated that she previously had a sore near the area of itching. She states the itching is mild. Denies any vaginal discharge, fever, vaginal bleeding, pain with urination, any injuries or vaginal foreign body.  Care advice given per protocol and appt made for tomorrow with her PCP.   Reason for Disposition . All other vaginal symptoms  (Exception: feels like prior yeast infection, minor abrasion, mild rash < 24 hour duration, mild itching)  Answer Assessment - Initial Assessment Questions 1. SYMPTOM: "What's the main symptom you're concerned about?" (e.g., pain, itching, dryness)     Itching 2. LOCATION: "Where is the  _______ located?" (e.g., inside/outside, left/right)     Outside left labia majora 3. ONSET: "When did the  ________  start?"     Sunday 4. PAIN: "Is there any pain?" If so, ask: "How bad is it?" (Scale: 1-10; mild, moderate, severe)     no 5. ITCHING: "Is there any itching?" If so, ask: "How bad is it?" (Scale: 1-10; mild, moderate, severe)     Yes mild 6. CAUSE: "What do you think is causing the symptoms?"     Maybe a yeast infection 7. OTHER SYMPTOMS: "Do you have any other symptoms?" (e.g., vaginal bleeding, pain with urination)     no 8. PREGNANCY: "Is there any chance you are pregnant?" "When was your last menstrual period?"     n/a  7. CAUSE: "What do you think is causing the discharge?" "Have you had the same problem before? What happened then?" Yeast infection-- no discharge 8. OTHER SYMPTOMS: "Do you have any other symptoms?" (e.g., fever, itching, vaginal bleeding, pain with urination, injury to genital area, vaginal foreign body) no  Protocols used: VAGINAL [facility]

## 2017-05-15 ENCOUNTER — Other Ambulatory Visit: Payer: Self-pay

## 2017-05-15 ENCOUNTER — Ambulatory Visit (INDEPENDENT_AMBULATORY_CARE_PROVIDER_SITE_OTHER): Payer: Medicare Other | Admitting: Family Medicine

## 2017-05-15 ENCOUNTER — Encounter: Payer: Self-pay | Admitting: Family Medicine

## 2017-05-15 VITALS — BP 122/83 | HR 76 | Temp 98.1°F | Resp 16 | Ht 59.0 in | Wt 95.0 lb

## 2017-05-15 DIAGNOSIS — B373 Candidiasis of vulva and vagina: Secondary | ICD-10-CM | POA: Diagnosis not present

## 2017-05-15 DIAGNOSIS — B3731 Acute candidiasis of vulva and vagina: Secondary | ICD-10-CM

## 2017-05-15 MED ORDER — MICONAZOLE NITRATE 2 % EX OINT: TOPICAL_OINTMENT | CUTANEOUS | 0 refills | Status: DC

## 2017-05-15 NOTE — Patient Instructions (Signed)
Follow up as needed or as scheduled Apply the Miconazole in a thin layer to labia twice daily until symptoms improve Call with any questions or concerns Hang in there!!!

## 2017-05-15 NOTE — Progress Notes (Signed)
   Subjective:    Patient ID: Manley Mason, female    DOB: Apr 23, 1940, 77 y.o.   MRN: [id number]  HPI Vaginal itching- pt is on '3 extremely strong antibiotics' and is having external itching and burning.  + redness in groin creases.  Pt has not used any OTC treatments.  No vaginal d/c.  sxs started ~4 days ago.   Review of Systems For ROS see HPI     Objective:   Physical Exam  Constitutional: She appears well-developed. No distress.  Genitourinary: No vaginal discharge found.  Genitourinary Comments: Beefy red labia bilaterally w/o evidence of excoriation or infection  Skin: Skin is warm and dry.  Vitals reviewed.         Assessment & Plan:  Vaginitis- new.  Given pt's use of multiple abx, suspect yeast vaginitis.  Start topical miconazole.  Reviewed supportive care and red flags that should prompt return.  Pt expressed understanding and is in agreement w/ plan.

## 2017-05-27 ENCOUNTER — Telehealth: Payer: Self-pay | Admitting: Pharmacist

## 2017-05-27 NOTE — Telephone Encounter (Signed)
Excellent thanks Mia Hernandez

## 2017-05-27 NOTE — Telephone Encounter (Signed)
Sent in the form to get another supply of clofazimine for patient's MAC infection. Will let Dr. Tommy Medal know when it has arrived from drug company.

## 2017-05-28 DIAGNOSIS — H906 Mixed conductive and sensorineural hearing loss, bilateral: Secondary | ICD-10-CM | POA: Diagnosis not present

## 2017-05-29 ENCOUNTER — Ambulatory Visit (INDEPENDENT_AMBULATORY_CARE_PROVIDER_SITE_OTHER): Payer: Medicare Other | Admitting: *Deleted

## 2017-05-29 DIAGNOSIS — R9431 Abnormal electrocardiogram [ECG] [EKG]: Secondary | ICD-10-CM

## 2017-05-29 NOTE — Progress Notes (Signed)
1.) Reason for visit EKG ONLY  2.) Name of MD requesting visit: DR CROITORU  3.) H&P: N/A  4.) ROS related to problem:N/A  5.) Assessment and plan per MD  EKG  OBTAINED BY RN . Per EKG --Pulse rate 70 , sinus rhythm Reviewed by Dr Sallyanne Kuster and signed. Per Dr Sallyanne Kuster no changes to treatment or medications. Patient aware. PER DR CROITORU , PATIENT DOES NOT NEED ANOTHER EKG UNTIL SEPT APPOINTMENT  UNLESS NEEDED. PT AWARE.

## 2017-06-04 ENCOUNTER — Telehealth: Payer: Self-pay | Admitting: Internal Medicine

## 2017-06-04 NOTE — Progress Notes (Signed)
_0  ID: Mia Hernandez, female    DOB: 09-07-1940, 77 y.o.   MRN: [id number]  Chief Complaint  Patient presents with  . Acute Visit    chronic cough, increased SOB x 1 month since starting arikayce for MAIC, nauseated and gagging. Increased weakness and using wheelchair to get around and loosing weight.    Referring provider: Midge Minium, MD  HPI: 77 year old female never smoker followed for cough, bronchiectasis/MAIC , obstructive airway disease, history of myocarditis. Cavitary lesion with history of hemoptysis resected/ RML/RLL lobectomy 09/2014 , right pleural effusion, left breast cancer/seeds +MAIC/ triple therapy begun 06/18/11- dc'd 2015 by Cardiology due to myocarditis ? Due to zith?.   05/27/2017-telephone encounter-reporting increased shortness of breath, labored breathing, and increased cough.-Scheduled for acute appointment on 5/29  Recent [facility] Encounters:   04/02/17- 77 year old female never smoker followed for cough, bronchiectasis, obstructive airway disease , history of myocarditis Cavitary lesion with history of hemoptysis resected, RML/RLL lobectomy 09/2014 for bronchiectasis, right pleural effusion, left breast cancer/seeds/partial mastect. +MAIC/ triple therapy began 06/18/11- dc'd 2015 by Cardiology due to myocarditis ? Due to zith?. Sputum Cx 12/25/16-  +  MAIC MAIC: Pt states she is doing pretty bad with her breathing. Increased SOB than usual, more weakness, lightheaded often and has passed out several times- a couple of times after getting out of shower-warm water.  Saw Dr Tommy Medal yesterday-now plan Azith/ liposomal amikacin/ clofazimine for [facility] with intermittent hemoptysis. Starting Zithromax first-cardiology to recheck EKG in 2 weeks because of previous attribution of myocarditis. More aware of DOE over the last couple of months with no acute event.  Still occasional hemoptysis-teaspoon red blood this morning.  Denies fever, sweats, chest  pain. Walk test 04/02/17 on room air-lowest saturation 92%  05/06/2017- 77 year old female never smoker followed for cough, bronchiectasis, obstructive airway disease , history of myocarditis Cavitary lesion with history of hemoptysis resected, RML/RLL lobectomy 09/2014 for bronchiectasis, right pleural effusion, left breast cancer/seeds/partial mastect. +MAIC/ triple therapy began 06/18/11- dc'd 2015 by Cardiology due to myocarditis ? Due to zith?. Sputum Cx 12/25/16-  +  MAIC ----Patient has started on Amikacin and Clofazimine (Arycase). Patient has a non-productive cough.  Being supervised by ID-she has now started Zithromax (being monitored by cardiology for potential myositis),clofazimien, Arikayce. About 3 days after starting Arikayce, she began noting active dry cough-a known side effect.  She is willing to continue.  She is intolerant of "morphine and relatives" but is willing to try benzonatate for cough. Has an audiologist to keep track for hearing.  Needs renal function followed.   Tests:  Office Spirometry 03/30/2016-moderate airway obstruction. FVC 1.76/77%, FEV1 1.12/65%, ratio 0.63, FEF 25-75% 0.59/42%. 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   Cardiac:  10/27/2013-echocardiogram-LV ejection fraction 45 to 50%  Labs:  6/43/3295-[id number] metabolic panel- hyponatremia 129, creatinine 0.53, GFR greater than 60 04/02/2017-CBC with differential- within normal limits  Micro:  Sputum Cx 12/25/16-  +  MAIC   Chart Review:  Reveals close follow-up with primary care, cardiology, and infectious disease  06/05/17 Acute   Patient reports the office today reporting worsening symptoms over the last 6 months with fatigue and shortness of breath.  Patient states that this has gotten worse since starting to take her Arikayce.  Patient has scheduled appointment to see infectious disease doctors today at 1045.  Since starting this medication patient has been reporting a cough we initially tried to treat with Tessalon after last appointment in April 2019.  Per patient infectious disease does not want her taking any sort of cough suppressant.  Patient also reporting nausea and diarrhea.  Patient reporting decreased appetite and feeling short of breath and more fatigued.  Husband confirms that patient symptoms have worsened over the past month or 2, specifically with doing [place] getting more tired and needing to be in a wheelchair.  Patient and husband have a list of potential side effects from the [place].  They report that they have been in close communication with your case reps and nurses in [place].  Stating that "most of the local [facility] providers here do not know enough about the drug".  The patient also reports that after taking her case symptoms are at their worst.  And as time goes by since taking her case the symptoms gradually improved.  Patient stating that at appointment today she did not take her air case this morning and feels better and feels that she is breathing better than she did yesterday.  Patient reports that they have not updated primary care with regarding her symptoms and progressive fatigue.  Or  potential need for DME supplies such as a Rollator walker or wheelchair.  Stating that "Dr. Annamaria Boots usually manages this."  Patient also wants to ask Dr. Annamaria Boots if she potentially needs to go back to thoracic surgery.   Allergies  Allergen Reactions  . Ethambutol Palpitations    Possible optic neuritis, possible gout attack  . Latex Rash  . Penicillins Hives    Breaks out in hives  . Prednisone Other (See Comments)     "out of mind" state  . Rifabutin Other (See Comments)    Loss of vision and anorexia  . Aspirin   . Betadine [Povidone Iodine]   . Codeine   . Doxycycline     faints  . Iodinated Diagnostic Agents   . 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 Morphine And Related   . Sulfa Antibiotics Other (See Comments)  . Azithromycin Other (See Comments)    Heart rhythm issues-prolonged QT wave    Immunization History  Administered Date(s) Administered  . Influenza, High Dose Seasonal PF 10/01/2016  . Influenza,inj,Quad PF,6+ Mos 12/08/2012, 09/27/2015  . Influenza-Unspecified 10/29/2013, 09/09/2014  . PPD Test 04/18/2011  . Pneumococcal Conjugate-13 02/16/2013  . Pneumococcal Polysaccharide-23 08/12/2014  . Tdap 08/16/2014  . Zoster 02/26/2011    Past Medical History:  Diagnosis Date  . Acute left eye pain 11/15/2014  . Adverse effect of general anesthetic    DELERIUM  . ALLERGIC RHINITIS   . Anxiety   . Arthritis   . Bronchiectasis ([place])   . Cancer ([place])    basal cell skin cancer removed  . Chronic back pain   . Complication of anesthesia    s/p lung surgery at [place] X 2 MONTHS  . Diverticulosis   . Dysphagia 10/13/2014  . Hydropneumothorax 11/15/2014  . Hypertension   .  Hyponatremia 09/15/2014  . Internal hemorrhoids   . Irritable bowel syndrome with constipation   . 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 Osteoporosis   . Stroke ([place])    TIA  SEVERAL YEARS AGO X 1 (85YRS -30 YRS AGO)  . Subcutaneous emphysema ([place]) 10/13/2014  . Toe pain 11/15/2014    Tobacco History: Social History   Tobacco Use  Smoking Status Never Smoker  Smokeless Tobacco Never Used   Counseling given: Not Answered   Outpatient Encounter Medications as of 06/05/2017  Medication Sig  . AMBULATORY NON FORMULARY MEDICATION Take 2 tablets by mouth daily. Medication Name: Clofazimine 43m  tablets  . Amikacin Sulfate Liposome (ARIKAYCE) 590 MG/8.4ML SUSP Inhale 590 mg into the lungs daily.  .Marland Kitchenazithromycin (ZITHROMAX) 250 MG tablet Take by mouth daily.  . irbesartan (AVAPRO) 150 MG tablet Take 1 tablet (150 mg total) by mouth daily.  . Miconazole Nitrate 2 % OINT Apply to labia twice daily until symptoms improve   No facility-administered encounter medications on file as of 06/05/2017.      Review of Systems  Constitutional:  +fatigue, 4lb weight loss, lassitude  No night sweats,  fevers, chills HEENT: +headaches  No  Difficulty swallowing,  Tooth/dental problems, or  Sore throat, No sneezing, itching, ear ache, nasal congestion, post nasal drip  CV: +dizziness at times No chest pain,  orthopnea, PND, swelling in lower extremities, anasarca, palpitations, syncope  GI: +nausea, diarrhea, loss of appetite No heartburn, indigestion, abdominal pain,vomiting, change in bowel habits, bloody stools Resp: +sob with exertion, cough, "feels that I need to get mucous up but cant"  No excess mucus, no productive cough, No coughing up of blood.  No change in color of mucus.  No wheezing.  No chest wall deformity Skin: no rash, lesions, no skin changes. GU: no dysuria, change in color of urine, no urgency or frequency.  No flank pain, no hematuria  MS:  No joint pain or swelling.  No decreased range of motion.  No back pain. Psych:  No change in mood or affect. No depression or anxiety.  No memory loss.   Physical  Exam  BP 120/70   Pulse 88   Temp 97.8 F (36.6 C) (Oral)   Ht _0  (1.499 m)   Wt 90 lb 6.4 oz (41 kg)   SpO2 96%   BMI 18.26 kg/m   GEN: A/Ox3; pleasant , NAD, well nourished, chronically ill    HEENT:  [place]/AT,  EACs-bilateral hearing aids, clear, TMs-wnl, NOSE-clear, THROAT-clear, no lesions, no postnasal drip or exudate noted.   NECK:  Supple w/ fair ROM; no JVD;  no lymphadenopathy.    RESP:  +coarse crackles in left and upper right lobe, dullness in right base, deep breath illcits cough  no accessory muscle use, no dullness to percussion  CARD:  RRR, no m/r/g, no peripheral edema, pulses intact, no cyanosis or clubbing.  GI:  +hyperactive bowel sounds  Soft & nt; no organomegaly or masses detected.   Musco: Warm bil, no deformities or joint swelling noted.   Neuro: alert, no focal deficits noted.    Skin: Warm, no lesions or rashes    Lab Results:  CBC    Component Value Date/Time   WBC 9.4 04/02/2017 1045   RBC 4.27 04/02/2017 1045   HGB 13.0 04/02/2017 1045   HCT 38.3 04/02/2017 1045  PLT 362.0 04/02/2017 1045   MCV 89.7 04/02/2017 1045   MCH 30.6 09/26/2015 1043   MCHC 34.0 04/02/2017 1045   RDW 14.3 04/02/2017 1045   LYMPHSABS 1.2 04/02/2017 1045   MONOABS 1.0 04/02/2017 1045   EOSABS 0.3 04/02/2017 1045   BASOSABS 0.1 04/02/2017 1045    BMET    Component Value Date/Time   NA 129 (L) 04/24/2017 1044   K 4.1 04/24/2017 1044   CL 91 (L) 04/24/2017 1044   CO2 31 04/24/2017 1044   GLUCOSE 90 04/24/2017 1044   BUN 9 04/24/2017 1044   CREATININE 0.53 04/24/2017 1044   CREATININE 0.55 (L) 09/15/2014 1113   CALCIUM 9.2 04/24/2017 1044   GFRNONAA >60 09/26/2015 1043   GFRNONAA >89 05/03/2014 1430   GFRAA >60 09/26/2015 1043   GFRAA >89 05/03/2014 1430    BNP No results found for: BNP  ProBNP No results found for: PROBNP  Imaging: No results found.   Assessment & Plan:   Pleasant 77 year old female presents today.  Discussed at  length with patient and spouse the importance of continuing treatment plan as follows, but this is their choice to stop if that is what they would like to do.  Patient to see infectious disease today at 73 and to discuss current symptoms as well as potential changes in plan of care.  Patient and spouse to then call pulmonary office if they do stop treatment as is.  To address the fatigue and shortness of breath we will refer to pulmonary rehab.  Patient and spouse have agreed to this, and I have updated Dr. Annamaria Boots.  No other changes at this time.  Patient to follow-up with primary care regarding fatigue and potential DME supplies needed.  I offered to write for a transfer wheelchair Rollator walker today, patient feels that she would rather wait and see how she does off the medication and see if this is really needed.  If so they will follow-up with primary care.  Bronchiectasis ([place]) Referral to pulmonary rehab today  Continue follow-up with infectious disease and keep appointment at 1045 today >>> Speak specifically with infectious disease about the symptoms that you are having, the timeframe that you are, as well as to see if we can manage your nausea and cough via medications to help continue your treatment >>> Notify [place] pulmonary if you decide to stop this medication  Notify primary care Dr. Birdie Riddle >>> Of your worsening fatigue >>> Pulmonary rehab referral >>> And potential need for Rollator walker or transport wheelchair  Keep your follow-up with Dr. 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 Annamaria Boots on 08/02/17  Cough Can use Tessalon [place] from last appointment.      Mia Rinne, NP 06/05/2017

## 2017-06-04 NOTE — Telephone Encounter (Signed)
Received 2 more bottles of clofazimine for patient whenever she needs it.

## 2017-06-04 NOTE — Telephone Encounter (Signed)
Called and spoke to patient. Patient stated she usually see's CY and that he knows everything that is going on with her. Patient reported that she has been having increased shortness of breath, labored breathing, and increased cough. Patient stated she is worried that she may need oxygen or a wheelchair.  CY has no appointments available. Offered acute visit for today with several providers but patient stated she can't come today. Scheduled acute visit with Wyn Quaker, NP for 5/29 at 0900. Nothing further is needed at this time.

## 2017-06-04 NOTE — Telephone Encounter (Signed)
She is on schedule tomorrow with me it seems

## 2017-06-05 ENCOUNTER — Ambulatory Visit (INDEPENDENT_AMBULATORY_CARE_PROVIDER_SITE_OTHER): Payer: Medicare Other | Admitting: Pulmonary Disease

## 2017-06-05 ENCOUNTER — Ambulatory Visit (INDEPENDENT_AMBULATORY_CARE_PROVIDER_SITE_OTHER): Payer: Medicare Other | Admitting: Infectious Disease

## 2017-06-05 ENCOUNTER — Encounter: Payer: Self-pay | Admitting: Infectious Disease

## 2017-06-05 ENCOUNTER — Encounter: Payer: Self-pay | Admitting: Pulmonary Disease

## 2017-06-05 VITALS — BP 166/101 | HR 94 | Temp 97.7°F | Ht 59.0 in | Wt 91.0 lb

## 2017-06-05 VITALS — BP 120/70 | HR 88 | Temp 97.8°F | Ht 59.0 in | Wt 90.4 lb

## 2017-06-05 DIAGNOSIS — Z8679 Personal history of other diseases of the circulatory system: Secondary | ICD-10-CM

## 2017-06-05 DIAGNOSIS — K5900 Constipation, unspecified: Secondary | ICD-10-CM

## 2017-06-05 DIAGNOSIS — R42 Dizziness and giddiness: Secondary | ICD-10-CM | POA: Diagnosis not present

## 2017-06-05 DIAGNOSIS — A310 Pulmonary mycobacterial infection: Secondary | ICD-10-CM | POA: Diagnosis not present

## 2017-06-05 DIAGNOSIS — J471 Bronchiectasis with (acute) exacerbation: Secondary | ICD-10-CM | POA: Diagnosis not present

## 2017-06-05 DIAGNOSIS — G4700 Insomnia, unspecified: Secondary | ICD-10-CM

## 2017-06-05 DIAGNOSIS — R05 Cough: Secondary | ICD-10-CM | POA: Diagnosis not present

## 2017-06-05 DIAGNOSIS — K582 Mixed irritable bowel syndrome: Secondary | ICD-10-CM

## 2017-06-05 DIAGNOSIS — R531 Weakness: Secondary | ICD-10-CM

## 2017-06-05 DIAGNOSIS — J449 Chronic obstructive pulmonary disease, unspecified: Secondary | ICD-10-CM

## 2017-06-05 DIAGNOSIS — R059 Cough, unspecified: Secondary | ICD-10-CM

## 2017-06-05 NOTE — Progress Notes (Signed)
[facility] for Infectious Disease   Chief complaint: Worsening coughing  with hemoptysis weight loss,  dizziness, and poor appetite   Subjective:      Patient ID: Mia Hernandez, female    DOB: 11/03/1940, 77 y.o.   MRN: [id number]  HPI 77 year old lady with past medical history significant for diagnosis of Mycobacterium avium infection of the lungs, recent Nocardia  INTERVAL HiSTORY:     Started on therapy with clarithromycin rifampin and ethambutol 3 days per week. She had great difficulty tolerating the Biaxin which was she was taking it twice daily doses on the day she was taking it was reduced to once daily dosing on July 01 2012 Apparently at that time there was concern about toxicity although she states that now that she was found to need simply new hearing aids. Initially while on therapy for Mycobacterium avium showed mprovement in her symptoms of chronic daily cough and sputum production.   In Fall of 2013 while [facility] 2013 apparently she had episodes of hemoptysis and was hospitalized due to Hospital in [place] where she was placed in isolation for rule out tuberculosis. Ultimately was realized that she was suffering from Mycobacterium avium infection alone.  She had repeat sputum taken Spring of 2014 and again grow Mycobacterium avium. The organism was sent for susceptibility testing and showed macrolide sensitivity and in vitro synergy with rifampin and ETH in inhibiting growth in lab. Amikacin would also be considered active at S she had.  I had Changed her to  daily therapy with macrolide (Azithromycin) ethambutol and rifampin. Since change she had been initiallycoughing less, she was gaining weight and was  absent fevers.   She saw Dr. Annamaria Boots in November 2014 when she reported 1 month she is coughing up more yellow/liquid phlem since on Rafampin. Chest x-ray was done and showed no changes. Sputum was sent for routine culture which only isolated normal oral pharyngeal  flora. AFB culture is without AFB seen on smear blood cultures in November DID grow M avium again   I had tried to get her on daily RIFABUTIN with continue azithro and ETH but she did not tolerate at all with severe anorexia, and apparent visual problems, stating that "I could not see for several hours."  She went back to Rifampin TIW,  azithro 549m daily, ETH 7070mTU, TH< SA< SU  In the interim she was seen by her Cardiologist Dr. CrSallyanne Kusterwho saw her in October and found NEW  deep symmetrical inverted T waves in all the inferior leads as well as V3-V6. The QTC prolongation on EKG that persistd on followup EKG. he was concern for possible antibiotic induced myocarditis and the patient stopped her anti-Mycobacterium drugs and had MRI of her heart performed which was normal echocardiogram was also normal.  Repeat EKG done in the next few days showed resolution of T wave changes and improvement in QT  Since coming off her anti-Mycobacterium avium drugs she had continued to cough on a daily basis   She had CXR that showed expansion of cavitary disease though most recent CXR showed stable findings.   Note her M avium was R to Moxifloxacin with MIC of 4.  It was I to linezolid with MIC of 16  It was S to clarithro with MIC of 2  Further MICs were:  Amikacin 16 Cipro 16 Ethambutol 8 Ethimodate > INH 8 Rifampin >8 Rifabutin 0.5 Streptomcin 32  Combination of ETH with clarithro, erythrom, rifampin, rifabutin all  produced no growth in vitro  See below      She came to our clinic multiple times in  Spring 2016 with worsening cough, with blood tinged sputum and actual hemoptysis, fatigue, lack of energy.  I corresponded with Dr Lorenda Cahill via email and he recommended not starting any therapy until he had a chance to review her case himself when she comes to visit him. She is on schedule to see him in August.  She was worked into clinic before seeing Dr. Lorenda Cahill with  worsening blood  tinged sputum and we ultimately decided upon consultation with Dr Lorenda Cahill and with Dr Sallyanne Kuster to re-challenge the patient with Azithro and [facility] which we did and there have been no EKG changes so far to suggest pericarditis , myocarditis or issues with QT prolongation.  Since she started on meds she has had improvement in sputum, no longer blood tinged and she is now able to sleep.  She was then seen  Dr Lorenda Cahill who collected sputum sample (though I cannot access it in [place]). Dr. Lorenda Cahill felt upon review of her films that her pulmonary disease was amenable to combined medical and surgical approach with resection of large cavitary areas from lungs to be considered by Dr. Elenor Quinones at [facility].   In the interm she grew Nocardia species from sputum and has been started on zyvox which she was tolerating  But then developed nose blleeds.  She ultimately underwent surgery at [facility] on 09/27/14 with bronchoscopy, thoracoscopy with removal of 2 lobes of lung. She was changed from zyvox to rocephin (to which her Norardia was S) and has remained on this with plan of 6 weeks of IV abx. It is believed that the infected part of lung was removed with surgery. She also grew M avium from prior culture from Dr.Stout's office.  Her cough had DRAMATICALLY improved postoperatively but she did develop significant postoperative subcutaneous emphysema. She had resumed her azithromycin and ETH. She has had some dysphagia due to subcutaneous emphysema but this is improving.  A few visits since then she  developed NEW complaints of acute left toe pain, with pain in her left eye and decreased visual acuity, along with malaise, nausea and reduced hearing.She stopped EThambutol and with our advise also the azithromycin to avoid monotherapy for her M avium.   WE had in the interim considered procuring clofazamine. Dr. Brigitte Pulse at [facility] corresponded with me and was very skeptical that the patients eye pain was due to [facility] and  recommended rechallenge with [facility] at 3m/kg TIW along with continued Azithro first.   Unfortunately when she restarted those meds she began experiencing eye pain, joint pain and palpitations and stopped all f these meds again in [place] 2017.  She CLAIMED at one of her more recent visits [place]  [id number] there was confirmation of the azithromycin causing her to have QT prolongation and myocarditis.  I see that she did have an EKG on January 06, 2016 which showed frequent PVCs but I do not see changes consistent with myocarditis.  Furthermore her QTC interval was similar in August 2018 when she was off of medications.   I had not seen her since that visit in the December 2017.  In the interim she has had worsening weight loss which she partly attributes to IBS.  She also though has had worsening cough at times with hemoptysis.  Imaging including CT scan of the lungs was done recently which shows November 2018:  1. Progressive  bronchiectasis involving the right lower lung with areas of cylindrical and cystic bronchiectasis. Patchy airspace nodules and endobronchial debris/ mucous plugging possibly related to a bronchiectasis related opportunistic infection. 2. Patchy areas of tree-in-bud appearance suggesting chronic inflammation or MAC. 3. No focal airspace consolidation, pulmonary edema or worrisome pulmonary mass. 4. Chronic right-sided pleural thickening and loculated fluid.  She is referred back to [place] by Dr. Annamaria Boots and she has yet again unsurprisingly growing Mycobacterium avium from her sputum cultures.   We have worked to obtain inhaled amikacin + clofazimine and azithromycin to come up with a salvage regimen given her intolerances of different meds.  She has her INH amikacin and would like 3 month supply of azithro as is cheaper for her. We do NOT yet have clofazimine.  She has been continuing to have daily severe cough that brings up copious sputum and blood tinged material at  times. She has lost weight , has poor appetite and frequent sore throat. She has had problems with dizziness as well. She has issues with bloating and  Urge to defecate and being worked up for IBS and being considered for endoscopy.  We have since been able to start her sequentially on azithromycin, clofazamine and more recently INH amikacin.  She noted that her hemoptysis stopped shortly after starting the macrolide. She also had EKG done with Dr. Sallyanne Kuster.  Which showed no abnormalities on EKG.  He states that when she started taking clofazimine it did change the color of her sputum.  She feels that since she started inhaled amikacin that the inhaled amikacin is causing her to cough more and she is at times not been taking it as she did yesterday.  She has a myriad of symptoms, many of which I cannot clearly attribute to her medications.  She continues to cough though it seems like that is better than before though now she thinks the amikacin is making her cough more and this certainly could be the case since it is inhaled medication.  She is going to go to pulmonary rehab per Dr. Janee Morn instructions.  She is telling me that she is having trouble sleeping due to the coughing still she says that she has trouble thinking straight that she has trouble with dizziness.  She also has problems with nausea with abdominal pain.  She is having less loose bowel movements than before.  As her hearing was checked recently and has not changed.  With anything she complains of having profound fatigue and feeling exhausted.  Past Medical History:  Diagnosis Date  . Acute left eye pain 11/15/2014  . Adverse effect of general anesthetic    DELERIUM  . ALLERGIC RHINITIS   . Anxiety   . Arthritis   . Bronchiectasis ([place])   . Cancer ([place])    basal cell skin cancer removed  . Chronic back pain   . Complication of anesthesia    s/p lung surgery at [place] X 2 MONTHS  .  Diverticulosis   . Dysphagia 10/13/2014  . Hydropneumothorax 11/15/2014  . Hypertension   . Hyponatremia 09/15/2014  . Internal hemorrhoids   . Irritable bowel syndrome with constipation   . Mycobacterium avium-intracellulare infection ([place])   . 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 Osteoporosis   . Stroke ([place])    TIA  SEVERAL YEARS AGO X 1 (52YRS -30 YRS AGO)  . Subcutaneous emphysema ([place]) 10/13/2014  . Toe  pain 11/15/2014   Past Surgical History:  Procedure Laterality Date  . BREAST BIOPSY    . BREAST LUMPECTOMY WITH RADIOACTIVE SEED LOCALIZATION Left 10/05/2015   Procedure: LEFT BREAST LUMPECTOMY WITH RADIOACTIVE SEED LOCALIZATION;  Surgeon: Excell Seltzer, MD;  Location: [facility];  Service: General;  Laterality: Left;  . CATARACT EXTRACTION W/ INTRAOCULAR LENS  IMPLANT, BILATERAL    . CHOLECYSTECTOMY    . LAMINECTOMY     cervical   . VAGINAL HYSTERECTOMY     Family History  Problem Relation Age of Onset  . Breast cancer Mother   . Stroke Father   . Pulmonary embolism Brother    Social History   Tobacco Use  . Smoking status: Never Smoker  . Smokeless tobacco: Never Used  Substance Use Topics  . Alcohol use: No  . Drug use: No     Review of Systems  Constitutional: Positive for activity change, appetite change, fatigue and unexpected weight change. Negative for diaphoresis.  HENT: Negative for congestion, sinus pressure and sneezing.   Eyes: Negative for photophobia and visual disturbance.  Respiratory: Positive for cough and shortness of breath. Negative for stridor.   Cardiovascular: Negative for palpitations and leg swelling.  Gastrointestinal: Positive for abdominal distention, abdominal pain and constipation. Negative for anal bleeding, blood in stool and vomiting.  Genitourinary: Negative for difficulty urinating, dysuria, flank pain and hematuria.  Musculoskeletal: Negative for arthralgias, back  pain, gait problem and joint swelling.  Skin: Negative for color change, pallor and wound.  Neurological: Positive for dizziness, weakness and light-headedness. Negative for tremors.  Hematological: Negative for adenopathy. Does not bruise/bleed easily.  Psychiatric/Behavioral: Positive for confusion and decreased concentration. Negative for agitation, behavioral problems, dysphoric mood and sleep disturbance. The patient is nervous/anxious.   All other systems reviewed and are negative.      Objective:   Physical Exam  Constitutional: She is oriented to person, place, and time. No distress.  HENT:  Head: Normocephalic and atraumatic.  Mouth/Throat: No oropharyngeal exudate.  Eyes: Conjunctivae and EOM are normal. No scleral icterus.  Neck: Normal range of motion. Neck supple.  Cardiovascular: Normal rate, regular rhythm and normal heart sounds. Exam reveals no gallop and no friction rub.  No murmur heard. Pulmonary/Chest: Effort normal. No respiratory distress. She has decreased breath sounds in the right lower field. She has no rhonchi.    prolonged expiratory phase with "peep" noises during expiration  Abdominal: She exhibits no distension.  Musculoskeletal: She exhibits no edema or tenderness.  Neurological: She is alert and oriented to person, place, and time. She exhibits normal muscle tone. Coordination normal.  Skin: Skin is warm and dry. No rash noted. She is not diaphoretic. No erythema. No pallor.  Psychiatric: Her behavior is normal. Judgment and thought content normal. Her mood appears anxious.  Nursing note and vitals reviewed.         Assessment & Plan:    #1  Mycobacterium Avium Intracellulare infection  sp resection of TWO lobes of lung.  She is now on azithromycin, clofazamine and inhaled amikacin  Not clear to me if she will build to continued tolerating the inhaled amikacin.  If not we may have to just make do with azithromycin and clofazimine.  I  support her going to pulmonary rehab may be this will help her with some of her symptoms.  #2 Abnormal EKG : ? Of drug induced vs idiopathic pericarditis, myocarditis, she is not had any abnormalities found on rechallenge with azithromycin the last  time or this time.  #4 HOH would not use systemic amikacin, there is indeed some risk of absorption into systemic system from inhaled but we dont have any other options given organism an dher intolerances.   #5  Bronchiectasis: sp lobectomy s  #6 found fatigue dizziness lightheadedness: Not clear to me what all of the symptoms are from but it is clear that her sleep is impaired and that may be contributing to some of the symptoms.  #7:  GI complaints: Seems to have some degree of IBS.  Certainly azithromycin can also cause GI upset but she really does not have many options to treat her mycobacterial infection.  I spent greater than 25 minutes with the patient including greater than 50% of time in face to face counsel of the patient regarding the importance of taking her medications for Mycobacterium AVM and the lack of other options to treat her, trying to coach her through taking the medications and trying to tease apart different symptoms  effects and how significant they were and whether or not they could be related to her medications and in coordination of her care.

## 2017-06-05 NOTE — Assessment & Plan Note (Signed)
Referral to pulmonary rehab today  Continue follow-up with infectious disease and keep appointment at 1045 today >>> Speak specifically with infectious disease about the symptoms that you are having, the timeframe that you are, as well as to see if we can manage your nausea and cough via medications to help continue your treatment >>> Notify [place] pulmonary if you decide to stop this medication  Notify primary care Dr. Birdie Riddle >>> Of your worsening fatigue >>> Pulmonary rehab referral >>> And potential need for Rollator walker or transport wheelchair  Keep your follow-up with Dr. Annamaria Boots on 08/02/17

## 2017-06-05 NOTE — Patient Instructions (Addendum)
Referral to pulmonary rehab today  Continue follow-up with infectious disease and keep appointment at 1045 today >>> Speak specifically with infectious disease about the symptoms that you are having, the timeframe that you are, as well as to see if we can manage your nausea and cough via medications to help continue your treatment >>> Notify [place] pulmonary if you decide to stop this medication  Notify primary care Dr. Birdie Riddle >>> Of your worsening fatigue >>> Pulmonary rehab referral >>> And potential need for Rollator walker or transport wheelchair  Keep your follow-up with Dr. Annamaria Boots on 08/02/17  Please contact the office if your symptoms worsen or you have concerns that you are not improving.   Thank you for choosing [facility] for your healthcare, and for allowing [place] to partner with you on your healthcare journey. I am thankful to be able to provide care to you today.   Wyn Quaker FNP-C

## 2017-06-05 NOTE — Assessment & Plan Note (Signed)
Can use Tessalon Perles from last appointment.

## 2017-06-05 NOTE — Assessment & Plan Note (Signed)
  Continue follow-up with infectious disease and keep appointment at 1045 today >>> Speak specifically with infectious disease about the symptoms that you are having, the timeframe that you are, as well as to see if we can manage your nausea and cough via medications to help continue your treatment >>> Notify [place] pulmonary if you decide to stop this medication   Keep your follow-up with Dr. Annamaria Boots on 08/02/17

## 2017-06-05 NOTE — Assessment & Plan Note (Signed)
Continue follow-up with cardiology 

## 2017-06-05 NOTE — Progress Notes (Signed)
HPI: Mia Hernandez is a 77 y.o. female is here to see Dr. Tommy Medal for her MAC  Allergies: Allergies  Allergen Reactions  . Ethambutol Palpitations    Possible optic neuritis, possible gout attack  . Latex Rash  . Penicillins Hives    Breaks out in hives  . Prednisone Other (See Comments)     "out of mind" state  . Rifabutin Other (See Comments)    Loss of vision and anorexia  . Aspirin   . Betadine [Povidone Iodine]   . Codeine   . Doxycycline     faints  . Iodinated Diagnostic Agents   . 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 Morphine And Related   . Sulfa Antibiotics Other (See Comments)  . Azithromycin Other (See Comments)    Heart rhythm issues-prolonged QT wave    Vitals: Temp: 97.7 F (36.5 C) (05/29 1020) Temp Source: Oral (05/29 1020) BP: 166/101 (05/29 1020) Pulse Rate: 94 (05/29 1020)  Past Medical History: Past Medical History:  Diagnosis Date  . Acute left eye pain 11/15/2014  . Adverse effect of general anesthetic    DELERIUM  . ALLERGIC RHINITIS   . Anxiety   . Arthritis   . Bronchiectasis ([place])   . Cancer ([place])    basal cell skin cancer removed  . Chronic back pain   . Complication of anesthesia    s/p lung surgery at [place] X 2 MONTHS  . Diverticulosis   . Dysphagia 10/13/2014  . Hydropneumothorax 11/15/2014  . Hypertension   . Hyponatremia 09/15/2014  . Internal hemorrhoids   . Irritable bowel syndrome with constipation   . Mycobacterium avium-intracellulare infection ([place])   . 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 Osteoporosis   . Stroke ([place])    TIA  SEVERAL YEARS AGO X 1 (72YRS -30 YRS AGO)  . Subcutaneous emphysema ([place]) 10/13/2014  . Toe pain 11/15/2014    Social History: Social History   Socioeconomic History  . Marital status: Married    Spouse name: Not on file  . Number of children: Not on file  . Years of  education: Not on file  . Highest education level: Not on file  Occupational History  . Occupation: retired  Scientific laboratory technician  . Financial resource strain: Not on file  . Food insecurity:    Worry: Not on file    Inability: Not on file  . Transportation needs:    Medical: Not on file    Non-medical: Not on file  Tobacco Use  . Smoking status: Never Smoker  . Smokeless tobacco: Never Used  Substance and Sexual Activity  . Alcohol use: No  . Drug use: No  . Sexual activity: Not on file  Lifestyle  . Physical activity:    Days per week: Not on file    Minutes per session: Not on file  . Stress: Not on file  Relationships  . Social connections:    Talks on phone: Not on file    Gets together: Not on file    Attends religious service: Not on file    Active member of club or organization: Not on file    Attends meetings of clubs or organizations: Not on file    Relationship status: Not on file  Other Topics Concern  . Not on file  Social History Narrative  . Not on file  Previous Regimen: Azithromycin/ethambutol/rifampin - rifabutin  Current Regimen: Arikace/azithromycin/clofazimine  Labs: No results found for: HIV1RNAQUANT, HIV1RNAVL, CD4TABS, HEPBSAB, HEPBSAG, HCVAB  CrCl: CrCl cannot be calculated (Patient's most recent lab result is older than the maximum 21 days allowed.).  Lipids:    Component Value Date/Time   CHOL 187 11/01/2016 1127   TRIG 52.0 11/01/2016 1127   HDL 81.00 11/01/2016 1127   CHOLHDL 2 11/01/2016 1127   VLDL 10.4 11/01/2016 1127   LDLCALC 96 11/01/2016 1127    Assessment: Mia Hernandez is here today for her routine MAC visit. She is now on three therapy listed above. She complained of breathing issue with the Arikace. She said that she tolerated the azithromycin very well. We gave her the continuation supply of clofazime today. This is for 200 tablets so it'll last her 100 days.   Recommendations:  Continue  Arikace/clofazimine/azithromycin  Onnie Boer, PharmD, BCPS, AAHIVP, CPP Clinical Infectious Disease Pharmacist [place] 06/05/2017, 1:40 PM

## 2017-06-07 ENCOUNTER — Telehealth: Payer: Self-pay | Admitting: Internal Medicine

## 2017-06-07 DIAGNOSIS — J449 Chronic obstructive pulmonary disease, unspecified: Secondary | ICD-10-CM

## 2017-06-07 NOTE — Telephone Encounter (Signed)
Is it a paper order or in Epic? LMTCB for [employer]

## 2017-06-10 NOTE — Telephone Encounter (Signed)
Called and spoke with Jarrett Soho at [facility] about order being signed.  Pulmonary Rehab order placed by Wyn Quaker, NP.  Order needs to be placed by Doctor.  New order placed by CY.

## 2017-06-26 ENCOUNTER — Encounter: Payer: Self-pay | Admitting: Infectious Disease

## 2017-06-26 ENCOUNTER — Encounter: Payer: Self-pay | Admitting: Internal Medicine

## 2017-06-26 ENCOUNTER — Telehealth: Payer: Self-pay

## 2017-06-26 NOTE — Telephone Encounter (Signed)
Dr. Annamaria Boots, below is a message received from a patient email. Please advise on this, thank you.       Message   ----- Message from [place], Generic sent at 06/26/2017 2:53 PM EDT -----    Am having problems with hearing and balane since regularly taking Arikayce. Arikayce instructions state this could be due to inner ear problems . Instructions say Arikayce should be stopped immedately and to contact the hearing specialist. I have done this and notified Dr. Tommy Medal. Am continuing other meds. At this point, I seem to be getting much worse, coughing night and day. Will advise on results of hearing test.

## 2017-06-27 NOTE — Telephone Encounter (Signed)
Dr. Annamaria Boots, pt sent another email with another question for you that goes along with the previous email Corrine sent to you 06/26/17.  Please advise on the below email as well as the one from 06/26/17 that is attached in the message from [place].  Thanks!  Message:   To: [facility] POOL    From: Manley Mason    Created: 06/26/2017 5:56 PM      When you discuss with Dr. Annamaria Boots, could you please ask if he thinks a consultation with a thoracic surgeon might be beneficial, since medication does not seem to be working.

## 2017-07-15 IMAGING — CR DG CHEST 2V
2 series · 2 of 2 positions shown · non-contrast
Comparison: 05/03/2014

CLINICAL DATA: Patient s/p right middle and lower lobectomy on
09/27/2014; patient c/o slight SOB and cough; HTN (not currently on
meds); hx mycobacterium avium-intracellulare infection

EXAM:
CHEST  2 VIEW

[w chest pa]
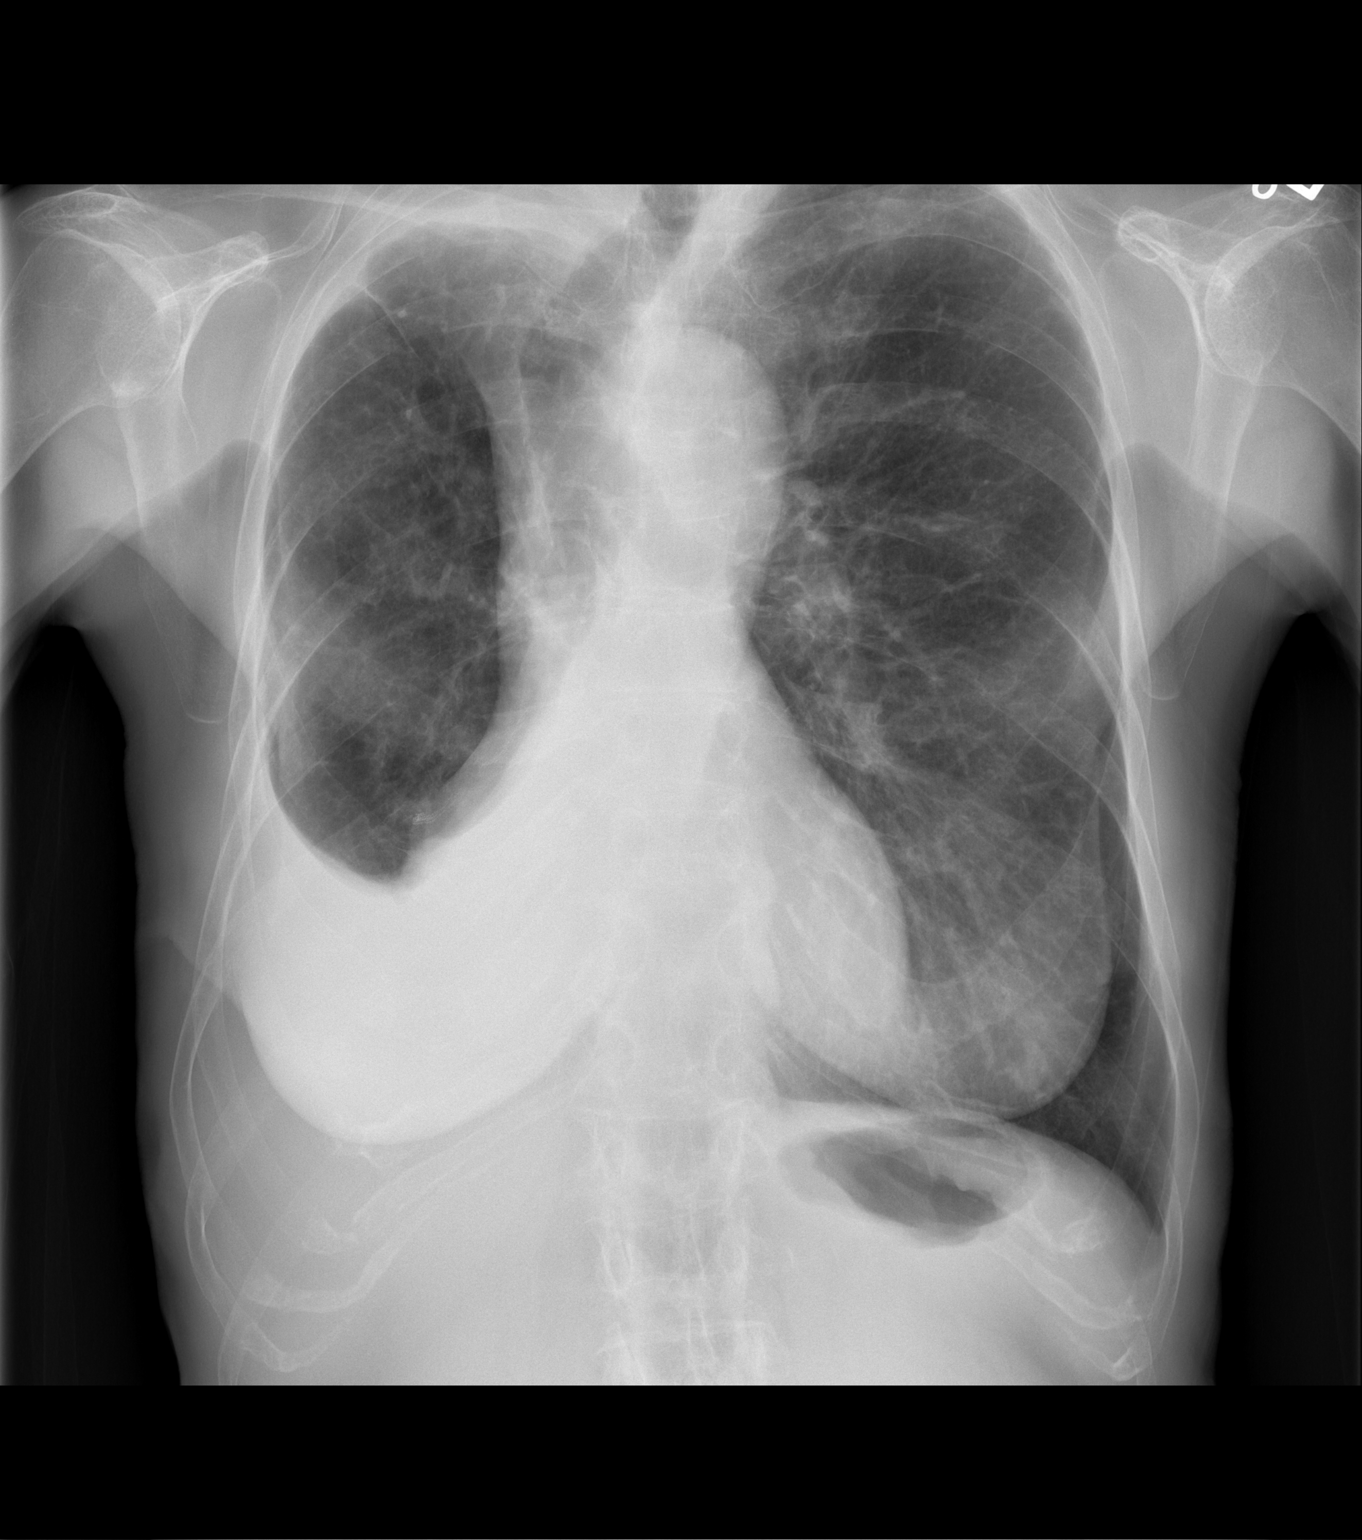

[w chest lat]
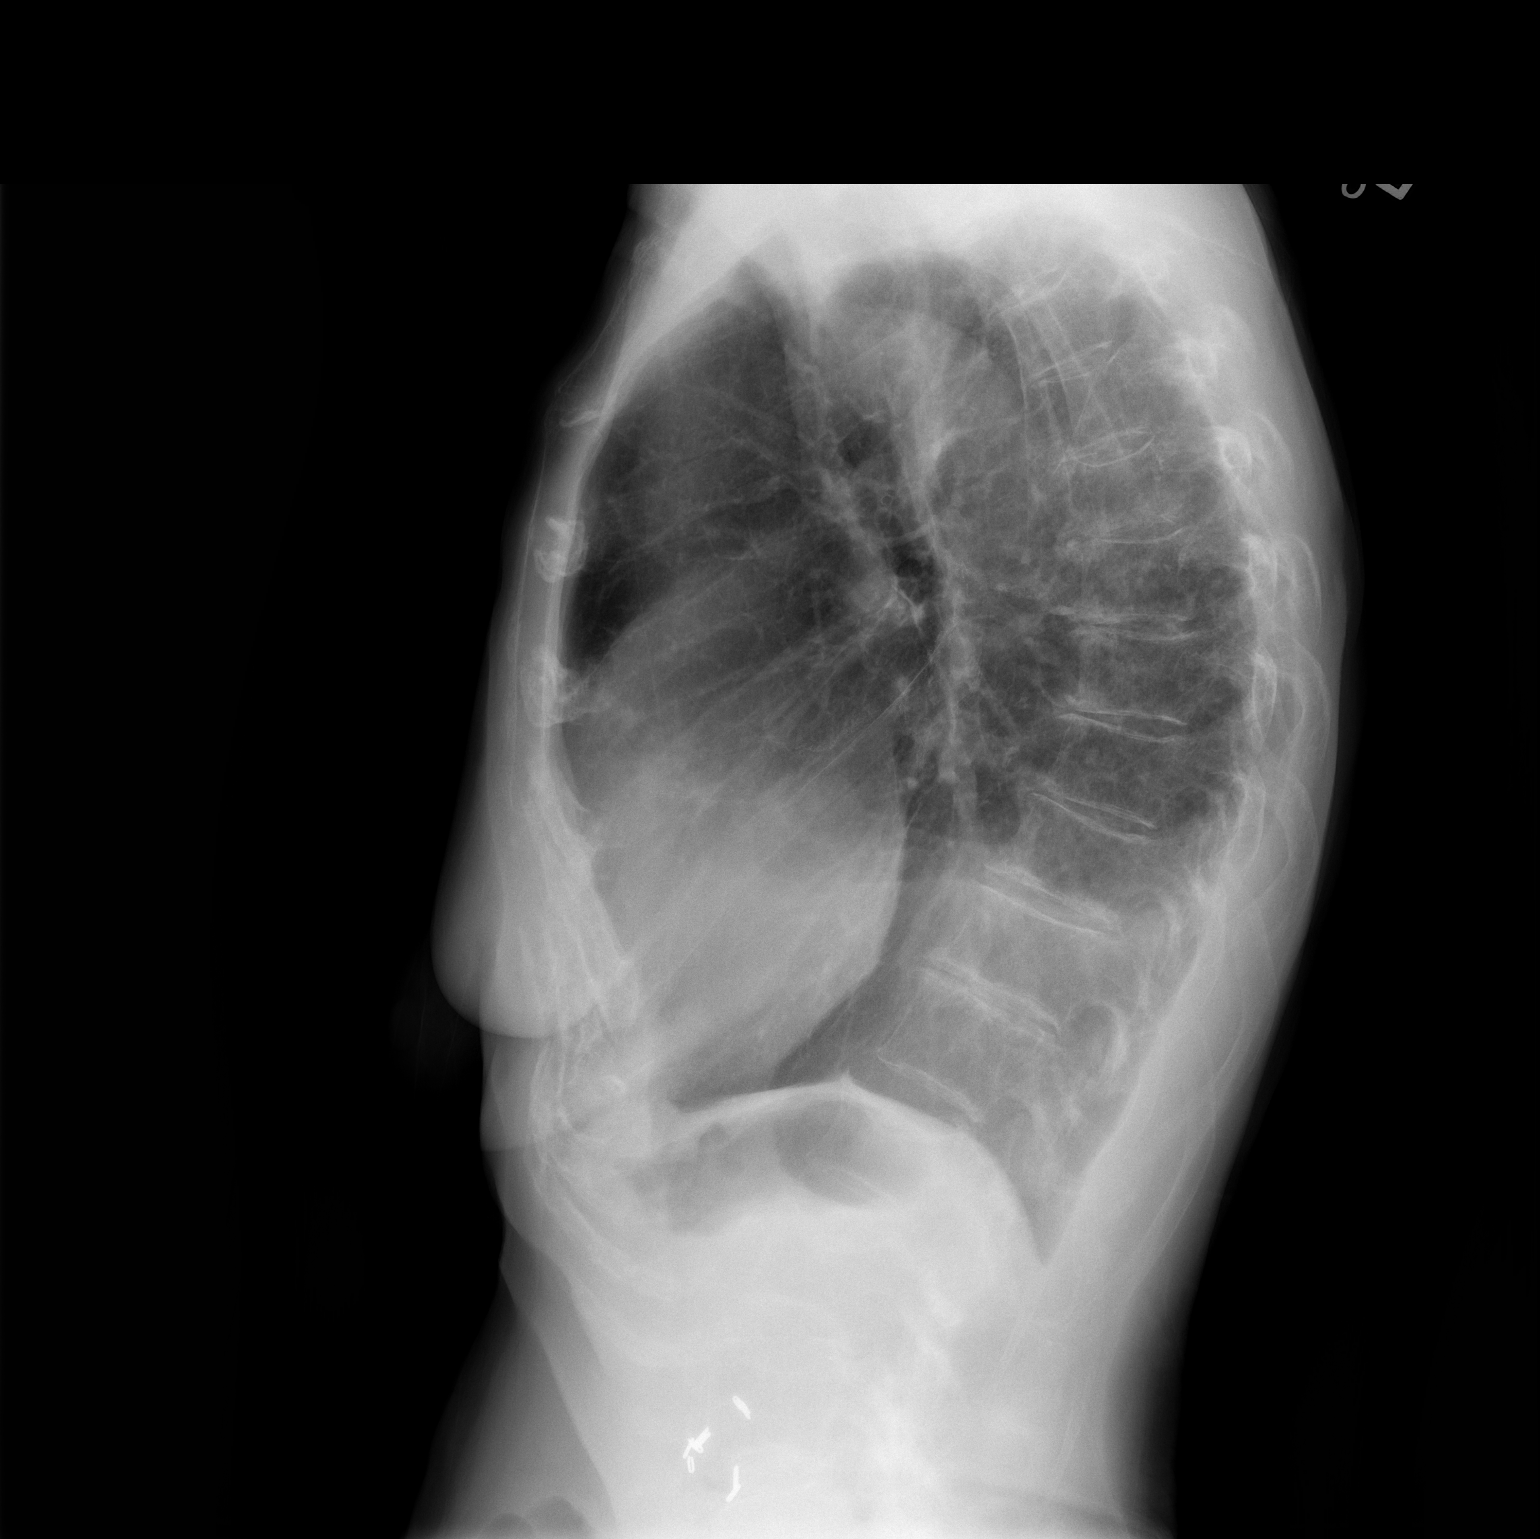

[2 of 2 positions shown; findings below may reference images not displayed]

FINDINGS: Cardiac silhouette normal in size and configuration. No mediastinal
or hilar masses or convincing adenopathy.

There is a moderate right pleural effusion obscuring part of the
right heart border and the entire right hemidiaphragm. No
significant left pleural effusion.

There is some volume loss on the right reflecting the previous
surgery.

Left lung is hyperexpanded.

No convincing pneumonia.  No pulmonary edema.  No pneumothorax.

Bony thorax is demineralized but grossly intact.
IMPRESSION: 1. Moderate right pleural effusion. Postsurgical changes on the
right from the previous lobectomies.
2. No convincing pneumonia.  No pulmonary edema.

## 2017-07-18 ENCOUNTER — Telehealth (HOSPITAL_COMMUNITY): Payer: Self-pay

## 2017-07-18 NOTE — Telephone Encounter (Signed)
Called patient to see if she is interested in the Pulmonary Rehab Program - Patient stated she is interested. She does have a lot going on with her husband as he had to have emergent surgery. Patient stated if she has to reschedule she will call. Scheduled orientation on 08/09/17 at 9:30am. Patient will attend the 10:30am exc class. Mailed packet.

## 2017-07-25 DIAGNOSIS — H5213 Myopia, bilateral: Secondary | ICD-10-CM | POA: Diagnosis not present

## 2017-07-25 DIAGNOSIS — Z961 Presence of intraocular lens: Secondary | ICD-10-CM | POA: Diagnosis not present

## 2017-07-25 DIAGNOSIS — H04123 Dry eye syndrome of bilateral lacrimal glands: Secondary | ICD-10-CM | POA: Diagnosis not present

## 2017-07-25 DIAGNOSIS — H53002 Unspecified amblyopia, left eye: Secondary | ICD-10-CM | POA: Diagnosis not present

## 2017-08-02 ENCOUNTER — Encounter: Payer: Self-pay | Admitting: Internal Medicine

## 2017-08-02 ENCOUNTER — Other Ambulatory Visit (INDEPENDENT_AMBULATORY_CARE_PROVIDER_SITE_OTHER): Payer: Medicare Other

## 2017-08-02 ENCOUNTER — Ambulatory Visit (INDEPENDENT_AMBULATORY_CARE_PROVIDER_SITE_OTHER)
Admission: RE | Admit: 2017-08-02 | Discharge: 2017-08-02 | Disposition: A | Discharge status: Home / Self Care | Payer: Medicare Other | Source: Ambulatory Visit | Attending: Internal Medicine | Admitting: Internal Medicine

## 2017-08-02 ENCOUNTER — Ambulatory Visit (INDEPENDENT_AMBULATORY_CARE_PROVIDER_SITE_OTHER): Payer: Medicare Other | Admitting: Internal Medicine

## 2017-08-02 VITALS — BP 110/60 | HR 93 | Ht 59.0 in | Wt 92.2 lb

## 2017-08-02 DIAGNOSIS — J479 Bronchiectasis, uncomplicated: Secondary | ICD-10-CM

## 2017-08-02 DIAGNOSIS — A310 Pulmonary mycobacterial infection: Secondary | ICD-10-CM | POA: Diagnosis not present

## 2017-08-02 DIAGNOSIS — R0600 Dyspnea, unspecified: Secondary | ICD-10-CM | POA: Diagnosis not present

## 2017-08-02 LAB — BASIC METABOLIC PANEL
BUN: 9 mg/dL (ref 6–23)
CHLORIDE: 90 meq/L — AB (ref 96–112)
CO2: 34 mEq/L — ABNORMAL HIGH (ref 19–32)
Calcium: 9.2 mg/dL (ref 8.4–10.5)
Creatinine, Ser: 0.46 mg/dL (ref 0.40–1.20)
GFR: 139.72 mL/min (ref >60.00)
Glucose, Bld: 102 mg/dL — ABNORMAL HIGH (ref 70–99)
POTASSIUM: 4.1 meq/L (ref 3.5–5.1)
SODIUM: 129 meq/L — AB (ref 135–145)

## 2017-08-02 LAB — CBC WITH DIFFERENTIAL/PLATELET
BASOS PCT: 1 % (ref 0.0–3.0)
Basophils Absolute: 0.1 10*3/uL (ref 0.0–0.1)
EOS PCT: 2.1 % (ref 0.0–5.0)
Eosinophils Absolute: 0.2 10*3/uL (ref 0.0–0.7)
HEMATOCRIT: 37.4 % (ref 36.0–46.0)
HEMOGLOBIN: 12.6 g/dL (ref 12.0–15.0)
LYMPHS PCT: 10.1 % — AB (ref 12.0–46.0)
Lymphs Abs: 0.9 10*3/uL (ref 0.7–4.0)
MCHC: 33.6 g/dL (ref 30.0–36.0)
MCV: 88.4 fl (ref 78.0–100.0)
MONO ABS: 1 10*3/uL (ref 0.1–1.0)
MONOS PCT: 10.9 % (ref 3.0–12.0)
Neutro Abs: 7 10*3/uL (ref 1.4–7.7)
Neutrophils Relative %: 75.9 % (ref 43.0–77.0)
Platelets: 451 10*3/uL — ABNORMAL HIGH (ref 150.0–400.0)
RBC: 4.23 Mil/uL (ref 3.87–5.11)
RDW: 15.2 % (ref 11.5–15.5)
WBC: 9.3 10*3/uL (ref 4.0–10.5)

## 2017-08-02 LAB — HEPATIC FUNCTION PANEL
ALT: 30 U/L (ref 0–35)
AST: 27 U/L (ref 0–37)
Albumin: 3.6 g/dL (ref 3.5–5.2)
Alkaline Phosphatase: 69 U/L (ref 39–117)
BILIRUBIN DIRECT: 0.1 mg/dL (ref 0.0–0.3)
TOTAL PROTEIN: 7.9 g/dL (ref 6.0–8.3)
Total Bilirubin: 0.6 mg/dL (ref 0.2–1.2)

## 2017-08-02 NOTE — Patient Instructions (Addendum)
Order- Pneumatic Vest     Dx bronchiectasis  ( failed Flutter device)  Order- Sputum cultures- routine, fungal and fluorochrome/ AFB  Order- CXR   Dx bronchiectasis, Mycobacterium avium/ MAIC  Order- labs- CBC w diff, BMET, Hepatic profile    Dx bronchiectasis

## 2017-08-02 NOTE — Progress Notes (Signed)
HPI female never smoker followed for cough, bronchiectasis/MAIC , obstructive airway disease, history of myocarditis. Cavitary lesion with history of hemoptysis resected/ RML/RLL lobectomy 09/2014 , right pleural effusion, left breast cancer/seeds  +MAIC/ triple therapy begun 06/18/11- dc'd 2015 by Cardiology due to myocarditis ? Due to zith?.   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 FVC 1.76/77%, FEV1 1.12/65%, ratio 0.63, FEF 25-75% 0.59/42%. Walk test 04/02/17 on room air-lowest saturation 92% Began zith/clofazimine/ Arikayce March, 2019. -----------------------------------------------------------------------------------------  05/06/2017- 77 year old female never smoker followed for cough, bronchiectasis, obstructive airway disease , history of myocarditis Cavitary lesion with history of hemoptysis resected, RML/RLL lobectomy 09/2014 for bronchiectasis, right pleural effusion, left breast cancer/seeds/partial mastect. +MAIC/ triple therapy began 06/18/11- dc'd 2015 by Cardiology due to myocarditis ? Due to zith?. Sputum Cx 12/25/16-  +  MAIC ----Patient has started on Amikacin and Clofazimine (Arycase). Patient has a non-productive cough.  Being supervised by ID-she has now started Zithromax (being monitored by cardiology for potential myositis),clofazimien, Arikayce. About 3 days after starting Arikayce, she began noting active dry cough-a known side effect.  She is willing to continue.  She is intolerant of "morphine and relatives" but is willing to try benzonatate for cough. Has an audiologist to keep track for hearing.  Needs renal function followed. CT chest Hi Res 04/04/2017 IMPRESSION: 1. Status post right middle and lower lobectomy with compensatory hyperexpansion  of the right upper lobe which is extensively affected by bronchiectasis and widespread areas of mucoid impaction. Overall, the appearance is very similar to prior study 11/28/2016. 2. Aortic atherosclerosis, in addition to left anterior descending coronary artery disease. Assessment for potential risk factor modification, dietary therapy or pharmacologic therapy may be warranted, if clinically indicated. 3. 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 Recommend annual imaging followup by CTA or MRA. This recommendation follows 2010 ACCF/AHA/AATS/ACR/ASA/SCA/SCAI/SIR/STS/SVM Guidelines for the Diagnosis and Management of Patients with Thoracic Aortic Disease. Circulation. 2010; 121: Z563-O756. 4. Additional incidental findings, as above.  08/02/2017-77 year old female never smoker followed for cough, bronchiectasis, obstructive airway disease , history of myocarditis Cavitary lesion with history of hemoptysis resected, RML/RLL lobectomy 09/2014 for bronchiectasis, right pleural effusion, left breast cancer/seeds/partial mastect -----Bronchiectasis: Pt states her cough is getting worse. I.D provider has taken a leave of absence and patient has not been able to get in. Pt to start Pulmonary rehab 08/09/17 for 13 weeks.  Arakayce was discontinued and she is only now taking azithromycin.  Much cough with thick yellow sputum but no hemoptysis.  Previously failed flutter device and is not strong enough to keep her airways clear with spontaneous cough.  Husband tries to help.  Denies fever or sweats.  She was hoping there would be a surgical lesion like her previous cavity, but nothing similar seen on recent chest CT.  Bronchiectasis again seen.  Over 6 months of productive cough.  ROS- see HPI    + = positive Constitutional:   No-   weight loss, night sweats, fevers, chills, fatigue, lassitude. HEENT:   No-  headaches, difficulty swallowing, tooth/dental problems, sore throat,        No-  sneezing, itching, ear ache, nasal congestion, + post nasal drip,  CV:  No-   chest pain, orthopnea, PND, swelling in lower extremities, anasarca, dizziness, palpitations Resp: +  shortness of breath with exertion or at rest.              +  productive cough,  little non-productive cough,  + coughing up of blood.                change in color of mucus.  No- wheezing.   Skin: No-   rash or lesions. GI:  No-   heartburn, indigestion, abdominal pain, nausea, vomiting,  GU:  MS:  No-   joint pain or swelling.   Neuro-     nothing unusual Psych:  No- change in mood or affect. No depression or anxiety.  No memory loss.  OBJ- Physical Exam    +She looks the same and is not coughing significantly at this visit General- Alert, Oriented, Affect-appropriate, Distress- none acute, +slender, +talkative Skin- rash-none, lesions- none, excoriation- none Lymphadenopathy- none Head- atraumatic            Eyes- Gross vision intact, PERRLA, conjunctivae and secretions clear            Ears- Hearing aides            Nose- Clear, no-Septal dev, mucus, polyps, erosion, perforation             Throat- Mallampati II , mucosa clear , drainage- none, tonsils- atrophic Neck- flexible , trachea midline, no stridor , thyroid nl, carotid no bruit Chest - symmetrical excursion , unlabored           Heart/CV- +bigeminal pulse , no murmur , no gallop  , no rub, nl s1 s2                           - JVD- none , edema- none, stasis changes- none, varices- none           Lung- + distant sounds right base, few crackles left base, cough+ , dullness+ right base, rub- none           Chest wall-  Abd-  Br/ Gen/ Rectal- Not done, not indicated Extrem- cyanosis- none, clubbing, none, atrophy- none, strength- nl Neuro- grossly intact to observation

## 2017-08-04 NOTE — Assessment & Plan Note (Addendum)
She did not tolerate Arikayce with stomach pain and cough. Now just on azithromycin.  Plan- update sputum cx and CXR

## 2017-08-04 NOTE — Assessment & Plan Note (Signed)
She needs help with airway clearance.  Her spontaneous cough, percussive help from husband, and flutter device have been insufficient.  She continues to cough purulent sputum. Plan-pneumatic vest, CXR, sputum culture

## 2017-08-05 ENCOUNTER — Encounter: Payer: Self-pay | Admitting: Pharmacist

## 2017-08-05 ENCOUNTER — Telehealth: Payer: Self-pay | Admitting: Internal Medicine

## 2017-08-05 NOTE — Telephone Encounter (Signed)
Notes recorded by Deneise Lever, MD on 08/04/2017 at 8:19 PM EDT Labs- sodium (salt level in blood) is low- ok to add salt to food and to cook with salt according to taste. Liver tests are normal.  Notes recorded by Deneise Lever, MD on 08/04/2017 at 8:21 PM EDT CXR- there is extensive scarring, but no change, nothing progressive, and nothing that a surgeon could operate to take out.  Called pt to let her know the results of her labwork and xray. Pt expressed understanding and stated when she saw the results of the sodium on mychart, she went and bought a can of campbell's soup to see if that would help bring sodium level up.  Pt stated she has been placing salt in foods when cooking.  Stated to pt if she needed anything from [place] before next OV to call our office.  Nothing further needed.Marland Kitchen

## 2017-08-05 NOTE — Telephone Encounter (Signed)
Patient returned call, CB is [phone].

## 2017-08-05 NOTE — Telephone Encounter (Signed)
Attempted to call patient today regarding results. I did not receive an answer at time of call. I have left a voicemail message for pt to return call. X1  

## 2017-08-07 ENCOUNTER — Other Ambulatory Visit: Payer: Medicare Other

## 2017-08-07 DIAGNOSIS — J479 Bronchiectasis, uncomplicated: Secondary | ICD-10-CM | POA: Diagnosis not present

## 2017-08-08 ENCOUNTER — Telehealth: Payer: Self-pay | Admitting: Internal Medicine

## 2017-08-08 DIAGNOSIS — J479 Bronchiectasis, uncomplicated: Secondary | ICD-10-CM

## 2017-08-09 ENCOUNTER — Encounter (HOSPITAL_COMMUNITY): Admission: RE | Admit: 2017-08-09 | Payer: Medicare Other | Source: Ambulatory Visit

## 2017-08-09 ENCOUNTER — Encounter (HOSPITAL_COMMUNITY)
Admission: RE | Admit: 2017-08-09 | Discharge: 2017-08-09 | Disposition: A | Discharge status: Home / Self Care | Payer: Medicare Other | Source: Ambulatory Visit | Attending: Internal Medicine | Admitting: Internal Medicine

## 2017-08-09 ENCOUNTER — Encounter (HOSPITAL_COMMUNITY): Payer: Self-pay

## 2017-08-09 VITALS — BP 138/70 | HR 90 | Temp 98.2°F | Resp 22 | Ht 59.5 in | Wt 93.0 lb

## 2017-08-09 DIAGNOSIS — F419 Anxiety disorder, unspecified: Secondary | ICD-10-CM | POA: Insufficient documentation

## 2017-08-09 DIAGNOSIS — Z85828 Personal history of other malignant neoplasm of skin: Secondary | ICD-10-CM | POA: Insufficient documentation

## 2017-08-09 DIAGNOSIS — J479 Bronchiectasis, uncomplicated: Secondary | ICD-10-CM

## 2017-08-09 DIAGNOSIS — Z8673 Personal history of transient ischemic attack (TIA), and cerebral infarction without residual deficits: Secondary | ICD-10-CM | POA: Insufficient documentation

## 2017-08-09 DIAGNOSIS — M1990 Unspecified osteoarthritis, unspecified site: Secondary | ICD-10-CM | POA: Diagnosis not present

## 2017-08-09 DIAGNOSIS — I10 Essential (primary) hypertension: Secondary | ICD-10-CM | POA: Insufficient documentation

## 2017-08-09 DIAGNOSIS — M810 Age-related osteoporosis without current pathological fracture: Secondary | ICD-10-CM | POA: Diagnosis not present

## 2017-08-09 DIAGNOSIS — Z79899 Other long term (current) drug therapy: Secondary | ICD-10-CM | POA: Diagnosis not present

## 2017-08-09 NOTE — Telephone Encounter (Signed)
Called and spoke with Carlette with Pulm Rehab at [phone]  The referral on pt was marked with dx COPD; with that pt needs to have PFT within one year Pt's last PFT was in 2013, therefore the dx needs to be Bronchiectasis without complication Replaced a referral order for pt today with correct qualifying dx per insurance- pt has Medicare part A Nothing further needed at this time.

## 2017-08-09 NOTE — Progress Notes (Signed)
Mia Hernandez 77 y.o. female Pulmonary Rehab Orientation Note Mia Hernandez referred to pulmonary rehab with the diagnosis of Bronchiectasis, arrived today in Cardiac and Pulmonary Rehab for orientation to Pulmonary Rehab. She was transported from [employer] via wheel chair by her husband. She does not carry portable oxygen. Per pt, she uses oxygen never. Color good, skin warm and dry. Patient is oriented to time and place. Patient's medical history, psychosocial health, and medications reviewed. Psychosocial assessment reveals pt lives with their spouse. Pt is currently retired from [employer] in [place]. Pt hobbies include reading and decorating her home. Pt reports her stress level is very low. Areas of stress/anxiety include Family. Pt husband of 79 years was recently diagnosis with colon CA.  He has begun treatment 2 weeks ago.  Pt does not exhibits signs of depression. PHQ2/9 score 0/1. Pt shows excellent coping skills with positive outlook. Pt prides herself on being able to cope with stress well and has very good support. Will continue to monitor and evaluate progress toward psychosocial goal(s) of gaining confidence in her ability to perform ADL's well. Physical assessment reveals heart rate is normal, breath sounds clear to auscultation, no wheezes, rales, or rhonchi.with productive cough which is expected with Bronchiectasis. Pt with Grip strength equal, strong. Distal pulses palpable with no swelling. Patient reports she does take medications as prescribed. Patient states she follows a Regular diet. The patient has been trying to gain weight by eating heavily.  Pt is the smallest she has every been.  Pt typically weighs 105 pounds and is presently 93 pounds.. Patient's weight will be monitored closely. Demonstration and practice of PLB using pulse oximeter. Patient able to return demonstration satisfactorily. Safety and hand hygiene in the exercise area reviewed with patient. Patient voices understanding of  the information reviewed. Department expectations discussed with patient and achievable goals were set. The patient shows enthusiasm about attending the program and we look forward to working with this nice patient. The patient is scheduled for a 6 min walk test on 8/6 and to begin exercise on 8/13 in the 10:30 class. 45 minutes was spent on a variety of activities such as assessment of the patient, obtaining baseline data including height, weight, BMI, and grip strength, verifying medical history, allergies, and current medications, and teaching patient strategies for performing tasks with less respiratory effort with emphasis on pursed lip breathing. Mia Hernandez, BSN Cardiac and Pulmonary Rehab Nurse Navigator

## 2017-08-13 ENCOUNTER — Encounter (HOSPITAL_COMMUNITY)
Admission: RE | Admit: 2017-08-13 | Discharge: 2017-08-13 | Disposition: A | Discharge status: Home / Self Care | Payer: Medicare Other | Source: Ambulatory Visit | Attending: Internal Medicine | Admitting: Internal Medicine

## 2017-08-13 DIAGNOSIS — J479 Bronchiectasis, uncomplicated: Secondary | ICD-10-CM

## 2017-08-13 DIAGNOSIS — M1990 Unspecified osteoarthritis, unspecified site: Secondary | ICD-10-CM | POA: Diagnosis not present

## 2017-08-13 DIAGNOSIS — I10 Essential (primary) hypertension: Secondary | ICD-10-CM | POA: Diagnosis not present

## 2017-08-13 DIAGNOSIS — Z79899 Other long term (current) drug therapy: Secondary | ICD-10-CM | POA: Diagnosis not present

## 2017-08-13 DIAGNOSIS — Z85828 Personal history of other malignant neoplasm of skin: Secondary | ICD-10-CM | POA: Diagnosis not present

## 2017-08-13 DIAGNOSIS — F419 Anxiety disorder, unspecified: Secondary | ICD-10-CM | POA: Diagnosis not present

## 2017-08-13 NOTE — Progress Notes (Signed)
Mia Hernandez 77 y.o. female  DOB: 10/01/1940 MRN: [id number]           Nutrition Note 1. Bronchiectasis without complication [facility])    Past Medical History:  Diagnosis Date  . Acute left eye pain 11/15/2014  . Adverse effect of general anesthetic    DELERIUM  . ALLERGIC RHINITIS   . Anxiety   . Arthritis   . Bronchiectasis ([place])   . Cancer ([place])    basal cell skin cancer removed  . Chronic back pain   . Complication of anesthesia    s/p lung surgery at [facility] X 2 MONTHS  . Diverticulosis   . Dysphagia 10/13/2014  . Hydropneumothorax 11/15/2014  . Hypertension   . Hyponatremia 09/15/2014  . Internal hemorrhoids   . Irritable bowel syndrome with constipation   . Mycobacterium avium-intracellulare infection ([place])   . 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 Osteoporosis   . Stroke ([place])    TIA  SEVERAL YEARS AGO X 1 (40YRS -30 YRS AGO)  . Subcutaneous emphysema ([place]) 10/13/2014  . Toe pain 11/15/2014   Meds reviewed.   Ht: Ht Readings from Last 1 Encounters:  08/09/17 4' 11.5" (1.511 m)     Wt:  Wt Readings from Last 3 Encounters:  08/09/17 93 lb 0.6 oz (42.2 kg)  08/02/17 92 lb 3.2 oz (41.8 kg)  06/05/17 91 lb (41.3 kg)     BMI: Body mass index is 18.48 kg/m.    Current tobacco use? No     Labs:  Lipid Panel     Component Value Date/Time   CHOL 187 11/01/2016 1127   TRIG 52.0 11/01/2016 1127   HDL 81.00 11/01/2016 1127   CHOLHDL 2 11/01/2016 1127   VLDL 10.4 11/01/2016 1127   LDLCALC 96 11/01/2016 1127    No results found for: HGBA1C   In a Year and a half patient has lost 9 lbs. Pt was originally 104-105 lbs, current weight is 95 lbs.   Nutrition Diagnosis  ? Increased energy expenditure related to increased energy requirements during bronchiectasis exacerbation as evidenced by BMI <20 and recent h/o wt loss.   Goal(s)  1. Pt to identify and increase food  sources high in protein and calories 2. The pt will recognize symptoms that can interfere with adequate oral intake, such as shortness of breath, N/V, early satiety, fatigue, ability to secure and prepare food, taste and smell changes, chewing/swallowing difficulties, and/ or pain when eating. 3. The pt will consume high-energy, high-nutrient dense beverages when necessary to compensate for decreased oral intake of solid foods. 4. The pt will have family and friends shop for food when necessary so that nourishing foods are always available at home. 5. Identify food quantities necessary to achieve wt maintenance or gain of  -2# per week to a goal wt gain of 2.7-10.9 kg (6-24 lb) at graduation from pulmonary rehab.  Plan:  Pt to attend Pulmonary Nutrition class Will provide client-centered nutrition education as part of interdisciplinary care.    Monitor and Evaluate progress toward nutrition goal with team.   Laurina Bustle, MS, RD, LDN 08/13/2017 1:36 PM

## 2017-08-14 ENCOUNTER — Encounter (HOSPITAL_COMMUNITY): Payer: Self-pay | Admitting: *Deleted

## 2017-08-15 NOTE — Progress Notes (Signed)
Pulmonary Individual Treatment Plan  Patient Details  Name: Mia Hernandez MRN: [id number] Date of Birth: 04/26/1940 Referring Provider:     PULMONARY REHAB OTHER RESPIRATORY from 08/13/2017 in [place]  Referring Provider  Dr. Annamaria Boots      Initial Encounter Date:    [place] from 08/13/2017 in [place]  Date  08/15/17      Visit Diagnosis: Bronchiectasis without complication ([place])  Patient's Home Medications on Admission:   Current Outpatient Medications:  .  azithromycin (ZITHROMAX) 250 MG tablet, Take by mouth daily., Disp: , Rfl:  .  irbesartan (AVAPRO) 150 MG tablet, Take 1 tablet (150 mg total) by mouth daily., Disp: 90 tablet, Rfl: 1  Past Medical History: Past Medical History:  Diagnosis Date  . Acute left eye pain 11/15/2014  . Adverse effect of general anesthetic    DELERIUM  . ALLERGIC RHINITIS   . Anxiety   . Arthritis   . Bronchiectasis ([place])   . Cancer ([place])    basal cell skin cancer removed  . Chronic back pain   . Complication of anesthesia    s/p lung surgery at [facility] X 2 MONTHS  . Diverticulosis   . Dysphagia 10/13/2014  . Hydropneumothorax 11/15/2014  . Hypertension   . Hyponatremia 09/15/2014  . Internal hemorrhoids   . Irritable bowel syndrome with constipation   . 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 Osteoporosis   . Stroke ([place])    TIA  SEVERAL YEARS AGO X 1 (82YRS -30 YRS AGO)  . Subcutaneous emphysema ([place]) 10/13/2014  . Toe pain 11/15/2014    Tobacco Use: Social History   Tobacco Use  Smoking Status Never Smoker  Smokeless Tobacco Never Used    Labs: Recent Review Scientist, physiological    Labs for ITP Cardiac and Pulmonary Rehab Latest Ref Rng & Units 03/08/2014 04/26/2016 11/01/2016   Cholestrol 0 - 200 mg/dL  212(H) 185 187   LDLCALC 0 - 99 mg/dL 80 90 96   HDL >39.00 mg/dL 120 79.40 81.00   Trlycerides 0.0 - 149.0 mg/dL 58 77.0 52.0      Capillary Blood Glucose: No results found for: GLUCAP   Pulmonary Assessment Scores:   Pulmonary Function Assessment:   Exercise Target Goals: Date: 08/15/17  Exercise Program Goal: Individual exercise prescription set using results from initial 6 min walk test and THRR while considering  patient's activity barriers and safety.    Exercise Prescription Goal: Initial exercise prescription builds to 30-45 minutes a day of aerobic activity, 2-3 days per week.  Home exercise guidelines will be given to patient during program as part of exercise prescription that the participant will acknowledge.  Activity Barriers & Risk Stratification:   6 Minute Walk: 6 Minute Walk    Row Name 08/15/17 0721 08/15/17 0748       6 Minute Walk   Phase  Initial  (Pended)   Initial    Distance  1375 feet  (Pended)   1375 feet    Walk Time  6 minutes  (Pended)   6 minutes    # of Rest Breaks  0  (Pended)   0    MPH  2.6  (Pended)   2.6    METS  2.99  (Pended)   2.99    RPE  11  (  Pended)   11    Perceived Dyspnea   2  (Pended)   2    Symptoms  No  (Pended)   No    Resting HR  86 bpm  (Pended)   86 bpm    Resting BP  150/80  (Pended)   150/80    Resting Oxygen Saturation   97 %  (Pended)   97 %    Exercise Oxygen Saturation  during 6 min walk  93 %  (Pended)   93 %    Max Ex. HR  107 bpm  (Pended)   107 bpm    Max Ex. BP  160/90  (Pended)   160/90    2 Minute Post BP  166/90  (Pended)   166/90      Interval HR   1 Minute HR  92  (Pended)   92    2 Minute HR  98  (Pended)   98    3 Minute HR  99  (Pended)   99    4 Minute HR  105  (Pended)   105    5 Minute HR  107  (Pended)   107    6 Minute HR  100  (Pended)   100    2 Minute Post HR  94  (Pended)   94    Interval Heart Rate?  Yes  (Pended)   Yes      Interval Oxygen   Interval Oxygen?  Yes   (Pended)   Yes    Baseline Oxygen Saturation %  97 %  (Pended)   97 %    1 Minute Oxygen Saturation %  97 %  (Pended)   97 %    1 Minute Liters of Oxygen  0 L  (Pended)   0 L    2 Minute Oxygen Saturation %  94 %  (Pended)   94 %    2 Minute Liters of Oxygen  0 L  (Pended)   0 L    3 Minute Oxygen Saturation %  93 %  (Pended)   93 %    3 Minute Liters of Oxygen  0 L  (Pended)   0 L    4 Minute Oxygen Saturation %  94 %  (Pended)   94 %    4 Minute Liters of Oxygen  0 L  (Pended)   0 L    5 Minute Oxygen Saturation %  -  95 %    5 Minute Liters of Oxygen  -  0 L    6 Minute Oxygen Saturation %  -  96 %    6 Minute Liters of Oxygen  -  0 L    2 Minute Post Oxygen Saturation %  -  97 %    2 Minute Post Liters of Oxygen  -  0 L       Oxygen Initial Assessment: Oxygen Initial Assessment - 08/15/17 0721      Initial 6 min Walk   Oxygen Used  None      Program Oxygen Prescription   Program Oxygen Prescription  None       Oxygen Re-Evaluation:   Oxygen Discharge (Final Oxygen Re-Evaluation):   Initial Exercise Prescription: Initial Exercise Prescription - 08/15/17 0700      Date of Initial Exercise RX and Referring Provider   Date  08/15/17    Referring Provider  Dr. Annamaria Boots      Recumbant Bike  Level  1    Watts  10    Minutes  17      NuStep   Level  2    SPM  80    Minutes  17    METs  1.5      Track   Laps  9    Minutes  17      Prescription Details   Frequency (times per week)  2    Duration  Progress to 45 minutes of aerobic exercise without signs/symptoms of physical distress      Intensity   THRR 40-80% of Max Heartrate  57-114    Ratings of Perceived Exertion  11-13    Perceived Dyspnea  0-4      Progression   Progression  Continue progressive overload as per policy without signs/symptoms or physical distress.      Resistance Training   Training Prescription  Yes    Weight  orange bands    Reps  10-15       Perform Capillary Blood Glucose  checks as needed.  Exercise Prescription Changes:   Exercise Comments:   Exercise Goals and Review:   Exercise Goals Re-Evaluation :   Discharge Exercise Prescription (Final Exercise Prescription Changes):   Nutrition:  Target Goals: Understanding of nutrition guidelines, daily intake of sodium 1500mg , cholesterol 200mg , calories 30% from fat and 7% or less from saturated fats, daily to have 5 or more servings of fruits and vegetables.  Biometrics:    Nutrition Therapy Plan and Nutrition Goals:   Nutrition Assessments:   Nutrition Goals Re-Evaluation:   Nutrition Goals Discharge (Final Nutrition Goals Re-Evaluation):   Psychosocial: Target Goals: Acknowledge presence or absence of significant depression and/or stress, maximize coping skills, provide positive support system. Participant is able to verbalize types and ability to use techniques and skills needed for reducing stress and depression.  Initial Review & Psychosocial Screening:   Quality of Life Scores:  Scores of 19 and below usually indicate a poorer quality of life in these areas.  A difference of  2-3 points is a clinically meaningful difference.  A difference of 2-3 points in the total score of the Quality of Life Index has been associated with significant improvement in overall quality of life, self-image, physical symptoms, and general health in studies assessing change in quality of life.   PHQ-9: Recent Review Flowsheet Data    Depression screen [facility] 2/9 08/09/2017 06/05/2017 04/01/2017 03/18/2017 01/30/2017   Decreased Interest 0 0 0 0 0   Down, Depressed, Hopeless 0 0 0 0 0   PHQ - 2 Score 0 0 0 0 0   Altered sleeping 0 - - - -   Tired, decreased energy 1 - - - -   Change in appetite 0 - - - -   Feeling bad or failure about yourself  0 - - - -   Trouble concentrating 0 - - - -   Moving slowly or fidgety/restless 0 - - - -   Suicidal thoughts 0 - - - -   PHQ-9 Score 1 - - - -   Difficult doing  work/chores Not difficult at all - - - -     Interpretation of Total Score  Total Score Depression Severity:  1-4 = Minimal depression, 5-9 = Mild depression, 10-14 = Moderate depression, 15-19 = Moderately severe depression, 20-27 = Severe depression   Psychosocial Evaluation and Intervention:   Psychosocial Re-Evaluation:   Psychosocial Discharge (Final Psychosocial Re-Evaluation):  Education: Education Goals: Education classes will be provided on a weekly basis, covering required topics. Participant will state understanding/return demonstration of topics presented.  Learning Barriers/Preferences:   Education Topics: Risk Factor Reduction:  -Group instruction that is supported by a PowerPoint presentation. Instructor discusses the definition of a risk factor, different risk factors for pulmonary disease, and how the heart and lungs work together.     Nutrition for Pulmonary Patient:  -Group instruction provided by PowerPoint slides, verbal discussion, and written materials to support subject matter. The instructor gives an explanation and review of healthy diet recommendations, which includes a discussion on weight management, recommendations for fruit and vegetable consumption, as well as protein, fluid, caffeine, fiber, sodium, sugar, and alcohol. Tips for eating when patients are short of breath are discussed.   Pursed Lip Breathing:  -Group instruction that is supported by demonstration and informational handouts. Instructor discusses the benefits of pursed lip and diaphragmatic breathing and detailed demonstration on how to preform both.     Oxygen Safety:  -Group instruction provided by PowerPoint, verbal discussion, and written material to support subject matter. There is an overview of "What is Oxygen" and "Why do we need it".  Instructor also reviews how to create a safe environment for oxygen use, the importance of using oxygen as prescribed, and the risks of  noncompliance. There is a brief discussion on traveling with oxygen and resources the patient may utilize.   Oxygen Equipment:  -Group instruction provided by [facility] Staff utilizing handouts, written materials, and equipment demonstrations.   Signs and Symptoms:  -Group instruction provided by written material and verbal discussion to support subject matter. Warning signs and symptoms of infection, stroke, and heart attack are reviewed and when to call the physician/911 reinforced. Tips for preventing the spread of infection discussed.   Advanced Directives:  -Group instruction provided by verbal instruction and written material to support subject matter. Instructor reviews Advanced Directive laws and proper instruction for filling out document.   Pulmonary Video:  -Group video education that reviews the importance of medication and oxygen compliance, exercise, good nutrition, pulmonary hygiene, and pursed lip and diaphragmatic breathing for the pulmonary patient.   Exercise for the Pulmonary Patient:  -Group instruction that is supported by a PowerPoint presentation. Instructor discusses benefits of exercise, core components of exercise, frequency, duration, and intensity of an exercise routine, importance of utilizing pulse oximetry during exercise, safety while exercising, and options of places to exercise outside of rehab.     Pulmonary Medications:  -Verbally interactive group education provided by instructor with focus on inhaled medications and proper administration.   Anatomy and Physiology of the Respiratory System and Intimacy:  -Group instruction provided by PowerPoint, verbal discussion, and written material to support subject matter. Instructor reviews respiratory cycle and anatomical components of the respiratory system and their functions. Instructor also reviews differences in obstructive and restrictive respiratory diseases with examples of each. Intimacy, Sex, and  Sexuality differences are reviewed with a discussion on how relationships can change when diagnosed with pulmonary disease. Common sexual concerns are reviewed.   MD DAY -A group question and answer session with a medical doctor that allows participants to ask questions that relate to their pulmonary disease state.   OTHER EDUCATION -Group or individual verbal, written, or video instructions that support the educational goals of the pulmonary rehab program.   Holiday Eating Survival Tips:  -Group instruction provided by PowerPoint slides, verbal discussion, and written materials to support subject matter. The instructor  gives patients tips, tricks, and techniques to help them not only survive but enjoy the holidays despite the onslaught of food that accompanies the holidays.   Knowledge Questionnaire Score:   Core Components/Risk Factors/Patient Goals at Admission:   Core Components/Risk Factors/Patient Goals Review:    Core Components/Risk Factors/Patient Goals at Discharge (Final Review):    ITP Comments:   Comments:

## 2017-08-16 DIAGNOSIS — H903 Sensorineural hearing loss, bilateral: Secondary | ICD-10-CM | POA: Diagnosis not present

## 2017-08-19 ENCOUNTER — Telehealth: Payer: Self-pay | Admitting: Internal Medicine

## 2017-08-19 DIAGNOSIS — J479 Bronchiectasis, uncomplicated: Secondary | ICD-10-CM

## 2017-08-19 NOTE — Telephone Encounter (Signed)
-----   Message from Deneise Lever, MD sent at 08/09/2017  9:04 AM EDT ----- Regarding: FW: new order for pulmonary rehab Katie- Can you please re-order Pulm Rehab for dx bronchiectasis.  ----- Message ----- From: Rowe Pavy, RN Sent: 08/08/2017   4:46 PM To: Deneise Lever, MD Subject: new order for pulmonary rehab                  Dr. Annamaria Boots,  The above pt referred to pulmonary rehab with the diagnosis of COPD.  I am unable to stage this because I do not have any post bronch PFT.  Per traditional medicare pt would need to be a stage 2-4 in order to receive reimbursement. Noted in pt history, bronchiectasis.  In order to proceed with pulmonary rehab, will need a new referral under respiratory care with the diagnosis of bronchiectasis.  Thanks  Psychologist, clinical, BSN Cardiac and Training and development officer

## 2017-08-19 NOTE — Telephone Encounter (Signed)
Left message for patient to call back  

## 2017-08-20 ENCOUNTER — Encounter (HOSPITAL_COMMUNITY)
Admission: RE | Admit: 2017-08-20 | Discharge: 2017-08-20 | Disposition: A | Discharge status: Home / Self Care | Payer: Medicare Other | Source: Ambulatory Visit | Attending: Internal Medicine | Admitting: Internal Medicine

## 2017-08-20 DIAGNOSIS — F419 Anxiety disorder, unspecified: Secondary | ICD-10-CM | POA: Diagnosis not present

## 2017-08-20 DIAGNOSIS — J479 Bronchiectasis, uncomplicated: Secondary | ICD-10-CM | POA: Diagnosis not present

## 2017-08-20 DIAGNOSIS — Z85828 Personal history of other malignant neoplasm of skin: Secondary | ICD-10-CM | POA: Diagnosis not present

## 2017-08-20 DIAGNOSIS — I10 Essential (primary) hypertension: Secondary | ICD-10-CM | POA: Diagnosis not present

## 2017-08-20 DIAGNOSIS — Z79899 Other long term (current) drug therapy: Secondary | ICD-10-CM | POA: Diagnosis not present

## 2017-08-20 DIAGNOSIS — M1990 Unspecified osteoarthritis, unspecified site: Secondary | ICD-10-CM | POA: Diagnosis not present

## 2017-08-20 NOTE — Progress Notes (Signed)
Daily Session Note  Patient Details  Name: Tim Wilhide MRN: [id number] Date of Birth: 04-18-1940 Referring Provider:     PULMONARY REHAB OTHER RESPIRATORY from 08/13/2017 in [place]  Referring Provider  Dr. Annamaria Boots      Encounter Date: 08/20/2017  Check In: Session Check In - 08/20/17 1141      Check-In   Supervising physician immediately available to respond to emergencies  [facility] immediately available    Physician(s)  Dr. Florene Glen    Location  [facility]    Staff Present  Su Hilt, MS, ACSM RCEP, Exercise Physiologist;Lisa Ysidro Evert, RN;Carlette Wilber Oliphant, RN, BSN;Ramon Dredge, RN, MHA    Medication changes reported      No    Fall or balance concerns reported     No    Tobacco Cessation  No Change    Warm-up and Cool-down  Performed as group-led instruction    Resistance Training Performed  Yes    VAD Patient?  No    PAD/SET Patient?  No      Pain Assessment   Currently in Pain?  No/denies       Capillary Blood Glucose: No results found for this or any previous visit (from the past 24 hour(s)).    Social History   Tobacco Use  Smoking Status Never Smoker  Smokeless Tobacco Never Used    Goals Met:  Achieving weight loss Personal goals reviewed Queuing for purse lip breathing  Goals Unmet:  Not Applicable  Comments: Service time is from 10:30a to 12:00p    Dr. Rush Farmer is Medical Director for Pulmonary Rehab at [facility].

## 2017-08-21 DIAGNOSIS — J471 Bronchiectasis with (acute) exacerbation: Secondary | ICD-10-CM

## 2017-08-21 DIAGNOSIS — J479 Bronchiectasis, uncomplicated: Secondary | ICD-10-CM

## 2017-08-21 NOTE — Telephone Encounter (Signed)
From: Manley Mason    Sent: 08/21/2017  7:57 AM EDT      To: Baird [place]ons, MD Subject: Non-Urgent Medical Question  Severe difficulty Smartvest machine.  Have soreness on all ribs and am sure I cracked a rib.  Soreness eases after a couple days non- use except for cracked rib which is now healed.  It also interfers with my Rehab exercises.  I have osteopensis with crumbling small bones. Dr Annette Stable said I could crack just turning over in bed.  Would like to distinue the use of this machine.  Rehab seems to be helping.   CY please advise. Thanks

## 2017-08-21 NOTE — Telephone Encounter (Signed)
Please tell her I'm si\orry it didn't help.  D/C Smart Vest

## 2017-08-21 NOTE — Addendum Note (Signed)
Addended by: Maryanna Shape A on: 08/21/2017 09:09 AM   Modules accepted: Orders

## 2017-08-22 ENCOUNTER — Encounter (HOSPITAL_COMMUNITY)
Admission: RE | Admit: 2017-08-22 | Discharge: 2017-08-22 | Disposition: A | Discharge status: Home / Self Care | Payer: Medicare Other | Source: Ambulatory Visit | Attending: Internal Medicine | Admitting: Internal Medicine

## 2017-08-22 ENCOUNTER — Telehealth: Payer: Self-pay

## 2017-08-22 DIAGNOSIS — M1990 Unspecified osteoarthritis, unspecified site: Secondary | ICD-10-CM | POA: Diagnosis not present

## 2017-08-22 DIAGNOSIS — J479 Bronchiectasis, uncomplicated: Secondary | ICD-10-CM | POA: Diagnosis not present

## 2017-08-22 DIAGNOSIS — F419 Anxiety disorder, unspecified: Secondary | ICD-10-CM | POA: Diagnosis not present

## 2017-08-22 DIAGNOSIS — Z79899 Other long term (current) drug therapy: Secondary | ICD-10-CM | POA: Diagnosis not present

## 2017-08-22 DIAGNOSIS — I10 Essential (primary) hypertension: Secondary | ICD-10-CM | POA: Diagnosis not present

## 2017-08-22 DIAGNOSIS — Z85828 Personal history of other malignant neoplasm of skin: Secondary | ICD-10-CM | POA: Diagnosis not present

## 2017-08-22 NOTE — Telephone Encounter (Signed)
We have ordered dc pneumatic vest

## 2017-08-22 NOTE — Telephone Encounter (Signed)
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 Please keep appointment in September with Infectious Disease to discuss what to do about this.

## 2017-08-22 NOTE — Telephone Encounter (Signed)
Attempted to call patient today regarding results. I did not receive an answer at time of call. I have left a voicemail message for pt to return call. X1  

## 2017-08-22 NOTE — Telephone Encounter (Signed)
[facility] called to give lab reports on pt's cultures. CY please review.

## 2017-08-22 NOTE — Progress Notes (Signed)
Daily Session Note  Patient Details  Name: Mia Hernandez MRN: [id number] Date of Birth: 11/12/1940 Referring Provider:     PULMONARY REHAB OTHER RESPIRATORY from 08/13/2017 in [facility]  Referring Provider  Dr. Young      Encounter Date: 08/22/2017  Check In: Session Check In - 08/22/17 1245      Check-In   Supervising physician immediately available to respond to emergencies  [facility] immediately available    Physician(s)  Dr. Powell    Location  [facility]    Staff Present   , MS, ACSM RCEP, Exercise Physiologist;Carlette Carlton, RN, BSN;Annedrea Stackhouse, RN, MHA    Medication changes reported      No    Fall or balance concerns reported     No    Tobacco Cessation  No Change    Warm-up and Cool-down  Performed as group-led instruction    Resistance Training Performed  Yes    VAD Patient?  No    PAD/SET Patient?  No      Pain Assessment   Currently in Pain?  No/denies    Multiple Pain Sites  No       Capillary Blood Glucose: No results found for this or any previous visit (from the past 24 hour(s)).    Social History   Tobacco Use  Smoking Status Never Smoker  Smokeless Tobacco Never Used    Goals Met:  Achieving weight loss Exercise tolerated well Personal goals reviewed  Goals Unmet:  Not Applicable  Comments: Service time is from 10:30a to 12:30p    Dr. Wesam G. Yacoub is Medical Director for Pulmonary Rehab at [facility]. 

## 2017-08-22 NOTE — Progress Notes (Signed)
Mia Hernandez 77 y.o. female   DOB: May 20, 1940 MRN: [id number]          Nutrition No diagnosis found. Past Medical History:  Diagnosis Date  . Acute left eye pain 11/15/2014  . Adverse effect of general anesthetic    DELERIUM  . ALLERGIC RHINITIS   . Anxiety   . Arthritis   . Bronchiectasis ([place])   . Cancer ([place])    basal cell skin cancer removed  . Chronic back pain   . Complication of anesthesia    s/p lung surgery at [place] X 2 MONTHS  . Diverticulosis   . Dysphagia 10/13/2014  . Hydropneumothorax 11/15/2014  . Hypertension   . Hyponatremia 09/15/2014  . Internal hemorrhoids   . Irritable bowel syndrome with constipation   . 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 Osteoporosis   . Stroke ([place])    TIA  SEVERAL YEARS AGO X 1 (74YRS -30 YRS AGO)  . Subcutaneous emphysema ([place]) 10/13/2014  . Toe pain 11/15/2014   Meds reviewed. Avapro, zithromax  noted  Ht: Ht Readings from Last 1 Encounters:  08/09/17 4' 11.5" (1.511 m)     Wt:  Wt Readings from Last 3 Encounters:  08/09/17 93 lb 0.6 oz (42.2 kg)  08/02/17 92 lb 3.2 oz (41.8 kg)  06/05/17 91 lb (41.3 kg)     BMI: 18.48    Current tobacco use? no  Labs:  Lipid Panel     Component Value Date/Time   CHOL 187 11/01/2016 1127   TRIG 52.0 11/01/2016 1127   HDL 81.00 11/01/2016 1127   CHOLHDL 2 11/01/2016 1127   VLDL 10.4 11/01/2016 1127   LDLCALC 96 11/01/2016 1127    No results found for: HGBA1C Note Spoke with pt. Pt is underweight.  Pt eats 3 meals a day; most prepared at home.  Making healthy food choices the majority of the time. Discussed including high calorie high protein foods into pts diet. Additionally discussed eating more frequently across the day to ensure adequate oral intake. Pt's Rate Your Plate results reviewed with pt. Pt does avoid salty food; doesn't  use canned/ convenience food.  Pt doesn't adds salt to food.  The role of sodium in lung disease reviewed with pt. Pt expressed understanding of the information reviewed.  Nutrition Diagnosis ? Inadequate oral intake related to food and nutrition related knowledge deficit as evidenced by diet history and 12 lb weight loss in last year.  Nutrition Intervention ? Pt's individual nutrition plan and goals reviewed with pt. ? Benefits of adopting healthy eating habits discussed when pt's Rate Your Plate reviewed.  Goal(s)  1. Pt to identify and increase food sources high in protein and calories 2. The pt will recognize symptoms that can interfere with adequate oral intake, such as shortness of breath, N/V, early satiety, fatigue, ability to secure and prepare food, taste and smell changes, chewing/swallowing difficulties, and/ or pain when eating. 3. The pt will consume high-energy, high-nutrient dense beverages when necessary to compensate for decreased oral intake of solid foods. 4. The pt will have family and friends shop for food when necessary so that nourishing foods are always available at home. 5. Identify food quantities necessary to achieve wt maintenance or gain of  -2# per week to a goal wt gain of 2.7-10.9 kg (6-24 lb) at graduation from pulmonary rehab.  Plan:  Pt to attend Pulmonary Nutrition class Will provide client-centered nutrition education as part of interdisciplinary care.    Monitor and Evaluate progress toward nutrition goal with team.   Laurina Bustle, MS, RD, LDN 08/22/2017 12:02 PM

## 2017-08-23 NOTE — Telephone Encounter (Signed)
Pt is returning call. Cb is [phone].

## 2017-08-23 NOTE — Telephone Encounter (Signed)
I have sent a message to the pt via MyChart per her request.

## 2017-08-26 ENCOUNTER — Other Ambulatory Visit: Payer: Self-pay | Admitting: Cardiovascular Disease

## 2017-08-26 ENCOUNTER — Telehealth: Payer: Self-pay | Admitting: Cardiovascular Disease

## 2017-08-26 MED ORDER — IRBESARTAN 150 MG PO TABS: 150.0000 mg | ORAL_TABLET | Freq: Every day | ORAL | 0 refills | Status: DC

## 2017-08-26 NOTE — Telephone Encounter (Signed)
New message:       *STAT* If patient is at the pharmacy, call can be transferred to refill team.   1. Which medications need to be refilled? (please list name of each medication and dose if known) irbesartan (AVAPRO) 150 MG tablet  2. Which pharmacy/location (including street and city if local pharmacy) is medication to be sent to?[facility], [place]  3. Do they need a 30 day or 90 day supply? [place]

## 2017-08-27 ENCOUNTER — Encounter (HOSPITAL_COMMUNITY)
Admission: RE | Admit: 2017-08-27 | Discharge: 2017-08-27 | Disposition: A | Discharge status: Home / Self Care | Payer: Medicare Other | Source: Ambulatory Visit | Attending: Internal Medicine | Admitting: Internal Medicine

## 2017-08-27 ENCOUNTER — Ambulatory Visit: Payer: Medicare Other | Admitting: Infectious Disease

## 2017-08-27 DIAGNOSIS — J479 Bronchiectasis, uncomplicated: Secondary | ICD-10-CM | POA: Diagnosis not present

## 2017-08-27 DIAGNOSIS — M1990 Unspecified osteoarthritis, unspecified site: Secondary | ICD-10-CM | POA: Diagnosis not present

## 2017-08-27 DIAGNOSIS — F419 Anxiety disorder, unspecified: Secondary | ICD-10-CM | POA: Diagnosis not present

## 2017-08-27 DIAGNOSIS — I10 Essential (primary) hypertension: Secondary | ICD-10-CM | POA: Diagnosis not present

## 2017-08-27 DIAGNOSIS — Z79899 Other long term (current) drug therapy: Secondary | ICD-10-CM | POA: Diagnosis not present

## 2017-08-27 DIAGNOSIS — Z85828 Personal history of other malignant neoplasm of skin: Secondary | ICD-10-CM | POA: Diagnosis not present

## 2017-08-27 NOTE — Progress Notes (Signed)
Pulmonary Individual Treatment Plan  Patient Details  Name: Mia Hernandez MRN: [id number] Date of Birth: [birth year] Referring Provider:     PULMONARY REHAB OTHER RESPIRATORY from 08/13/2017 in [facility]  Referring Provider  Dr. Annamaria Boots      Initial Encounter Date:    [facility] from 08/13/2017 in [facility]  Date  08/15/17      Visit Diagnosis: Bronchiectasis without complication ([place])  Patient's Home Medications on Admission:   Current Outpatient Medications:  .  azithromycin (ZITHROMAX) 250 MG tablet, Take by mouth daily., Disp: , Rfl:  .  irbesartan (AVAPRO) 150 MG tablet, Take 1 tablet (150 mg total) by mouth daily. KEEP OV., Disp: 90 tablet, Rfl: 0 .  irbesartan (AVAPRO) 150 MG tablet, Take 1 tablet (150 mg total) by mouth daily., Disp: 90 tablet, Rfl: 0  Past Medical History: Past Medical History:  Diagnosis Date  . Acute left eye pain 11/15/2014  . Adverse effect of general anesthetic    DELERIUM  . ALLERGIC RHINITIS   . Anxiety   . Arthritis   . Bronchiectasis ([place])   . Cancer ([place])    basal cell skin cancer removed  . Chronic back pain   . Complication of anesthesia    s/p lung surgery at [place] X 2 MONTHS  . Diverticulosis   . Dysphagia 10/13/2014  . Hydropneumothorax 11/15/2014  . Hypertension   . Hyponatremia 09/15/2014  . Internal hemorrhoids   . Irritable bowel syndrome with constipation   . 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 Osteoporosis   . Stroke ([place])    TIA  SEVERAL YEARS AGO X 1 (71YRS -30 YRS AGO)  . Subcutaneous emphysema ([place]) 10/13/2014  . Toe pain 11/15/2014    Tobacco Use: Social History   Tobacco Use  Smoking Status Never Smoker  Smokeless Tobacco Never Used    Labs: Recent Review Scientist, physiological    Labs  for ITP Cardiac and Pulmonary Rehab Latest Ref Rng & Units 03/08/2014 04/26/2016 11/01/2016   Cholestrol 0 - 200 mg/dL 212(H) 185 187   LDLCALC 0 - 99 mg/dL 80 90 96   HDL >39.00 mg/dL 120 79.40 81.00   Trlycerides 0.0 - 149.0 mg/dL 58 77.0 52.0      Capillary Blood Glucose: No results found for: GLUCAP   Pulmonary Assessment Scores: Pulmonary Assessment Scores    Row Name 08/14/17 1419         ADL UCSD   ADL Phase  Entry     SOB Score total  67       CAT Score   CAT Score  26        Pulmonary Function Assessment:   Exercise Target Goals: Exercise Program Goal: Individual exercise prescription set using results from initial 6 min walk test and THRR while considering  patient's activity barriers and safety.   Exercise Prescription Goal: Initial exercise prescription builds to 30-45 minutes a day of aerobic activity, 2-3 days per week.  Home exercise guidelines will be given to patient during program as part of exercise prescription that the participant will acknowledge.  Activity Barriers & Risk Stratification: Activity Barriers & Cardiac Risk Stratification - 08/09/17 1002      Activity Barriers & Cardiac Risk Stratification   Activity Barriers  Back Problems;Deconditioning;Muscular Weakness;Shortness of Breath  Cardiac Risk Stratification  Low       6 Minute Walk: 6 Minute Walk    Row Name 08/15/17 0721 08/15/17 0748       6 Minute Walk   Phase  Initial  (Pended)   Initial    Distance  1375 feet  (Pended)   1375 feet    Walk Time  6 minutes  (Pended)   6 minutes    # of Rest Breaks  0  (Pended)   0    MPH  2.6  (Pended)   2.6    METS  2.99  (Pended)   2.99    RPE  11  (Pended)   11    Perceived Dyspnea   2  (Pended)   2    Symptoms  No  (Pended)   No    Resting HR  86 bpm  (Pended)   86 bpm    Resting BP  150/80  (Pended)   150/80    Resting Oxygen Saturation   97 %  (Pended)   97 %    Exercise Oxygen Saturation  during 6 min walk  93 %  (Pended)   93  %    Max Ex. HR  107 bpm  (Pended)   107 bpm    Max Ex. BP  160/90  (Pended)   160/90    2 Minute Post BP  166/90  (Pended)   166/90      Interval HR   1 Minute HR  92  (Pended)   92    2 Minute HR  98  (Pended)   98    3 Minute HR  99  (Pended)   99    4 Minute HR  105  (Pended)   105    5 Minute HR  107  (Pended)   107    6 Minute HR  100  (Pended)   100    2 Minute Post HR  94  (Pended)   94    Interval Heart Rate?  Yes  (Pended)   Yes      Interval Oxygen   Interval Oxygen?  Yes  (Pended)   Yes    Baseline Oxygen Saturation %  97 %  (Pended)   97 %    1 Minute Oxygen Saturation %  97 %  (Pended)   97 %    1 Minute Liters of Oxygen  0 L  (Pended)   0 L    2 Minute Oxygen Saturation %  94 %  (Pended)   94 %    2 Minute Liters of Oxygen  0 L  (Pended)   0 L    3 Minute Oxygen Saturation %  93 %  (Pended)   93 %    3 Minute Liters of Oxygen  0 L  (Pended)   0 L    4 Minute Oxygen Saturation %  94 %  (Pended)   94 %    4 Minute Liters of Oxygen  0 L  (Pended)   0 L    5 Minute Oxygen Saturation %  -  95 %    5 Minute Liters of Oxygen  -  0 L    6 Minute Oxygen Saturation %  -  96 %    6 Minute Liters of Oxygen  -  0 L    2 Minute Post Oxygen Saturation %  -  97 %    2 Minute  Post Liters of Oxygen  -  0 L       Oxygen Initial Assessment: Oxygen Initial Assessment - 08/15/17 0721      Initial 6 min Walk   Oxygen Used  None      Program Oxygen Prescription   Program Oxygen Prescription  None       Oxygen Re-Evaluation: Oxygen Re-Evaluation    Row Name 08/26/17 1204             Program Oxygen Prescription   Program Oxygen Prescription  None         Home Oxygen   Home Oxygen Device  None       Sleep Oxygen Prescription  None       Home Exercise Oxygen Prescription  None       Home at Rest Exercise Oxygen Prescription  None          Oxygen Discharge (Final Oxygen Re-Evaluation): Oxygen Re-Evaluation - 08/26/17 1204      Program Oxygen Prescription    Program Oxygen Prescription  None      Home Oxygen   Home Oxygen Device  None    Sleep Oxygen Prescription  None    Home Exercise Oxygen Prescription  None    Home at Rest Exercise Oxygen Prescription  None       Initial Exercise Prescription: Initial Exercise Prescription - 08/15/17 0700      Date of Initial Exercise RX and Referring Provider   Date  08/15/17    Referring Provider  Dr. Annamaria Boots      Recumbant Bike   Level  1    Watts  10    Minutes  17      NuStep   Level  2    SPM  80    Minutes  17    METs  1.5      Track   Laps  9    Minutes  17      Prescription Details   Frequency (times per week)  2    Duration  Progress to 45 minutes of aerobic exercise without signs/symptoms of physical distress      Intensity   THRR 40-80% of Max Heartrate  57-114    Ratings of Perceived Exertion  11-13    Perceived Dyspnea  0-4      Progression   Progression  Continue progressive overload as per policy without signs/symptoms or physical distress.      Resistance Training   Training Prescription  Yes    Weight  orange bands    Reps  10-15       Perform Capillary Blood Glucose checks as needed.  Exercise Prescription Changes:  Exercise Prescription Changes    Row Name 08/27/17 1200             Response to Exercise   Blood Pressure (Admit)  128/58       Blood Pressure (Exercise)  158/82       Blood Pressure (Exit)  104/72       Heart Rate (Admit)  84 bpm       Heart Rate (Exercise)  99 bpm       Heart Rate (Exit)  84 bpm       Oxygen Saturation (Admit)  98 %       Oxygen Saturation (Exercise)  92 %       Oxygen Saturation (Exit)  97 %       Rating of Perceived Exertion (Exercise)  13       Perceived Dyspnea (Exercise)  1       Duration  Progress to 45 minutes of aerobic exercise without signs/symptoms of physical distress       Intensity  THRR unchanged         Progression   Progression  Continue to progress workloads to maintain intensity without  signs/symptoms of physical distress.         Resistance Training   Training Prescription  Yes       Weight  orange bands       Reps  10-15         Recumbant Bike   Level  1       Watts  10       Minutes  17         NuStep   Level  2       SPM  80       Minutes  17       METs  1.6         Track   Laps  14       Minutes  17          Exercise Comments:   Exercise Goals and Review:  Exercise Goals    Row Name 08/09/17 1003             Exercise Goals   Increase Physical Activity  Yes       Intervention  Provide advice, education, support and counseling about physical activity/exercise needs.;Develop an individualized exercise prescription for aerobic and resistive training based on initial evaluation findings, risk stratification, comorbidities and participant's personal goals.       Expected Outcomes  Short Term: Attend rehab on a regular basis to increase amount of physical activity.;Long Term: Add in home exercise to make exercise part of routine and to increase amount of physical activity.;Long Term: Exercising regularly at least 3-5 days a week.       Increase Strength and Stamina  Yes       Intervention  Provide advice, education, support and counseling about physical activity/exercise needs.;Develop an individualized exercise prescription for aerobic and resistive training based on initial evaluation findings, risk stratification, comorbidities and participant's personal goals.       Expected Outcomes  Short Term: Increase workloads from initial exercise prescription for resistance, speed, and METs.;Short Term: Perform resistance training exercises routinely during rehab and add in resistance training at home;Long Term: Improve cardiorespiratory fitness, muscular endurance and strength as measured by increased METs and functional capacity (6MWT)       Able to understand and use rate of perceived exertion (RPE) scale  Yes       Intervention  Provide education and  explanation on how to use RPE scale       Expected Outcomes  Short Term: Able to use RPE daily in rehab to express subjective intensity level;Long Term:  Able to use RPE to guide intensity level when exercising independently       Able to understand and use Dyspnea scale  Yes       Intervention  Provide education and explanation on how to use Dyspnea scale       Expected Outcomes  Short Term: Able to use Dyspnea scale daily in rehab to express subjective sense of shortness of breath during exertion;Long Term: Able to use Dyspnea scale to guide intensity level when exercising independently       Knowledge and understanding  of Target Heart Rate Range (THRR)  Yes       Intervention  Provide education and explanation of THRR including how the numbers were predicted and where they are located for reference       Expected Outcomes  Short Term: Able to state/look up THRR;Long Term: Able to use THRR to govern intensity when exercising independently;Short Term: Able to use daily as guideline for intensity in rehab       Understanding of Exercise Prescription  Yes       Intervention  Provide education, explanation, and written materials on patient's individual exercise prescription       Expected Outcomes  Short Term: Able to explain program exercise prescription;Long Term: Able to explain home exercise prescription to exercise independently          Exercise Goals Re-Evaluation : Exercise Goals Re-Evaluation    [place] Name 08/26/17 1204             Exercise Goal Re-Evaluation   Exercise Goals Review  Increase Physical Activity;Increase Strength and Stamina;Able to understand and use rate of perceived exertion (RPE) scale;Able to understand and use Dyspnea scale;Knowledge and understanding of Target Heart Rate Range (THRR);Understanding of Exercise Prescription       Comments  The patient has only attended 2 rehab sessions. Will cont. to monitor and progress as able.       Expected Outcomes  Through  exercise at rehab and at home, the patient will increase physical capacity making ADL's easier to perform. They will also become comfortable with developing and carrying out an exercise regime at home.           Discharge Exercise Prescription (Final Exercise Prescription Changes): Exercise Prescription Changes - 08/27/17 1200      Response to Exercise   Blood Pressure (Admit)  128/58    Blood Pressure (Exercise)  158/82    Blood Pressure (Exit)  104/72    Heart Rate (Admit)  84 bpm    Heart Rate (Exercise)  99 bpm    Heart Rate (Exit)  84 bpm    Oxygen Saturation (Admit)  98 %    Oxygen Saturation (Exercise)  92 %    Oxygen Saturation (Exit)  97 %    Rating of Perceived Exertion (Exercise)  13    Perceived Dyspnea (Exercise)  1    Duration  Progress to 45 minutes of aerobic exercise without signs/symptoms of physical distress    Intensity  THRR unchanged      Progression   Progression  Continue to progress workloads to maintain intensity without signs/symptoms of physical distress.      Resistance Training   Training Prescription  Yes    Weight  orange bands    Reps  10-15      Recumbant Bike   Level  1    Watts  10    Minutes  17      NuStep   Level  2    SPM  80    Minutes  17    METs  1.6      Track   Laps  14    Minutes  17       Nutrition:  Target Goals: Understanding of nutrition guidelines, daily intake of sodium 1500mg , cholesterol 200mg , calories 30% from fat and 7% or less from saturated fats, daily to have 5 or more servings of fruits and vegetables.  Biometrics:    Nutrition Therapy Plan and Nutrition Goals: Nutrition Therapy &  Goals - 08/13/17 1348      Nutrition Therapy   Diet  high proteinm high calorie      Personal Nutrition Goals   Nutrition Goal  Pt to identify and increase food sources high in protein and calories    Personal Goal #2  The pt will consume high-energy, high-nutrient dense beverages when necessary to compensate for  decreased oral intake of solid foods.    Personal Goal #3  Identify food quantities necessary to achieve wt maintenance or gain of  -2# per week to a goal wt gain of 2.7-10.9 kg (6-24 lb) at graduation from pulmonary rehab.      Intervention Plan   Intervention  Prescribe, educate and counsel regarding individualized specific dietary modifications aiming towards targeted core components such as weight, hypertension, lipid management, diabetes, heart failure and other comorbidities.    Expected Outcomes  Short Term Goal: Understand basic principles of dietary content, such as calories, fat, sodium, cholesterol and nutrients.       Nutrition Assessments: Nutrition Assessments - 08/13/17 1349      Rate Your Plate Scores   Pre Score  35       Nutrition Goals Re-Evaluation: Nutrition Goals Re-Evaluation    [place] Name 08/13/17 1348             Goals   Current Weight  95 lb (43.1 kg)          Nutrition Goals Discharge (Final Nutrition Goals Re-Evaluation): Nutrition Goals Re-Evaluation - 08/13/17 1348      Goals   Current Weight  95 lb (43.1 kg)       Psychosocial: Target Goals: Acknowledge presence or absence of significant depression and/or stress, maximize coping skills, provide positive support system. Participant is able to verbalize types and ability to use techniques and skills needed for reducing stress and depression.  Initial Review & Psychosocial Screening: Initial Psych Review & Screening - 08/09/17 0955      Initial Review   Current issues with  Current Stress Concerns    Source of Stress Concerns  Family;Unable to participate in former interests or hobbies;Unable to perform yard/household activities   husband recent diagnosis of colon CA under treatment at [facility]  pt dealing with  husband well with healthy coping [place]?  Yes    Comments  pt married 55 years.  Pt supportive of each other.       Barriers   Psychosocial barriers to participate in program  The patient should benefit from training in stress management and relaxation.       Quality of Life Scores:  Scores of 19 and below usually indicate a poorer quality of life in these areas.  A difference of  2-3 points is a clinically meaningful difference.  A difference of 2-3 points in the total score of the Quality of Life Index has been associated with significant improvement in overall quality of life, self-image, physical symptoms, and general health in studies assessing change in quality of life.  PHQ-9: Recent Review Flowsheet Data    Depression screen [facility] 2/9 08/09/2017 06/05/2017 04/01/2017 03/18/2017 01/30/2017   Decreased Interest 0 0 0 0 0   Down, Depressed, Hopeless 0 0 0 0 0   PHQ - 2 Score 0 0 0 0 0   Altered sleeping 0 - - - -   Tired, decreased energy 1 - - - -   Change  in appetite 0 - - - -   Feeling bad or failure about yourself  0 - - - -   Trouble concentrating 0 - - - -   Moving slowly or fidgety/restless 0 - - - -   Suicidal thoughts 0 - - - -   PHQ-9 Score 1 - - - -   Difficult doing work/chores Not difficult at all - - - -     Interpretation of Total Score  Total Score Depression Severity:  1-4 = Minimal depression, 5-9 = Mild depression, 10-14 = Moderate depression, 15-19 = Moderately severe depression, 20-27 = Severe depression   Psychosocial Evaluation and Intervention: Psychosocial Evaluation - 08/27/17 1623      Psychosocial Evaluation & Interventions   Interventions  Encouraged to exercise with the program and follow exercise prescription;Relaxation education;Stress management education    Comments  no identifiable barriers     Expected Outcomes  Pt will continue to display positive healthy skills and realistic outlook on future    Continue Psychosocial Services   Follow up required by staff       Psychosocial Re-Evaluation: Psychosocial Re-Evaluation    [place] Name 08/27/17 1623 08/27/17  1625           Psychosocial Re-Evaluation   Current issues with  None Identified  Current Stress Concerns      Comments  no identifiable barriers  Pt husband is undergoing chemotherapy for recurrent cancer      Expected Outcomes  Pt will continue to display heatlhy coping skills and positive outlook on life  Pt will display heatlhy coping skills and positive outlook on life      Interventions  Stress management education;Relaxation education;Encouraged to attend Pulmonary Rehabilitation for the exercise  -      Continue Psychosocial Services   Follow up required by staff  -         Psychosocial Discharge (Final Psychosocial Re-Evaluation): Psychosocial Re-Evaluation - 08/27/17 1625      Psychosocial Re-Evaluation   Current issues with  Current Stress Concerns    Comments  Pt husband is undergoing chemotherapy for recurrent cancer    Expected Outcomes  Pt will display heatlhy coping skills and positive outlook on life       Education: Education Goals: Education classes will be provided on a weekly basis, covering required topics. Participant will state understanding/return demonstration of topics presented.  Learning Barriers/Preferences: Learning Barriers/Preferences - 08/09/17 0959      Learning Barriers/Preferences   Learning Barriers  Hearing;Sight    Learning Preferences  Written Material;Pictoral;Computer/Internet       Education Topics: Risk Factor Reduction:  -Group instruction that is supported by a PowerPoint presentation. Instructor discusses the definition of a risk factor, different risk factors for pulmonary disease, and how the heart and lungs work together.     Nutrition for Pulmonary Patient:  -Group instruction provided by PowerPoint slides, verbal discussion, and written materials to support subject matter. The instructor gives an explanation and review of healthy diet recommendations, which includes a discussion on weight management, recommendations for  fruit and vegetable consumption, as well as protein, fluid, caffeine, fiber, sodium, sugar, and alcohol. Tips for eating when patients are short of breath are discussed.   Pursed Lip Breathing:  -Group instruction that is supported by demonstration and informational handouts. Instructor discusses the benefits of pursed lip and diaphragmatic breathing and detailed demonstration on how to preform both.     Oxygen Safety:  -Group instruction provided by PowerPoint,  verbal discussion, and written material to support subject matter. There is an overview of "What is Oxygen" and "Why do we need it".  Instructor also reviews how to create a safe environment for oxygen use, the importance of using oxygen as prescribed, and the risks of noncompliance. There is a brief discussion on traveling with oxygen and resources the patient may utilize.   Oxygen Equipment:  -Group instruction provided by [facility] Staff utilizing handouts, written materials, and equipment demonstrations.   Signs and Symptoms:  -Group instruction provided by written material and verbal discussion to support subject matter. Warning signs and symptoms of infection, stroke, and heart attack are reviewed and when to call the physician/911 reinforced. Tips for preventing the spread of infection discussed.   Advanced Directives:  -Group instruction provided by verbal instruction and written material to support subject matter. Instructor reviews Advanced Directive laws and proper instruction for filling out document.   Pulmonary Video:  -Group video education that reviews the importance of medication and oxygen compliance, exercise, good nutrition, pulmonary hygiene, and pursed lip and diaphragmatic breathing for the pulmonary patient.   Exercise for the Pulmonary Patient:  -Group instruction that is supported by a PowerPoint presentation. Instructor discusses benefits of exercise, core components of exercise, frequency, duration,  and intensity of an exercise routine, importance of utilizing pulse oximetry during exercise, safety while exercising, and options of places to exercise outside of rehab.     Pulmonary Medications:  -Verbally interactive group education provided by instructor with focus on inhaled medications and proper administration.   Anatomy and Physiology of the Respiratory System and Intimacy:  -Group instruction provided by PowerPoint, verbal discussion, and written material to support subject matter. Instructor reviews respiratory cycle and anatomical components of the respiratory system and their functions. Instructor also reviews differences in obstructive and restrictive respiratory diseases with examples of each. Intimacy, Sex, and Sexuality differences are reviewed with a discussion on how relationships can change when diagnosed with pulmonary disease. Common sexual concerns are reviewed.   MD DAY -A group question and answer session with a medical doctor that allows participants to ask questions that relate to their pulmonary disease state.   OTHER EDUCATION -Group or individual verbal, written, or video instructions that support the educational goals of the pulmonary rehab program.   Holiday Eating Survival Tips:  -Group instruction provided by PowerPoint slides, verbal discussion, and written materials to support subject matter. The instructor gives patients tips, tricks, and techniques to help them not only survive but enjoy the holidays despite the onslaught of food that accompanies the holidays.   Knowledge Questionnaire Score: Knowledge Questionnaire Score - 08/14/17 1419      Knowledge Questionnaire Score   Pre Score  14/18       Core Components/Risk Factors/Patient Goals at Admission: Personal Goals and Risk Factors at Admission - 08/09/17 0952      Core Components/Risk Factors/Patient Goals on Admission    Weight Management  Yes;Weight Gain    Intervention  Obesity: Provide  education and appropriate resources to help participant work on and attain dietary goals.;Weight Management: Provide education and appropriate resources to help participant work on and attain dietary goals.;Weight Management: Develop a combined nutrition and exercise program designed to reach desired caloric intake, while maintaining appropriate intake of nutrient and fiber, sodium and fats, and appropriate energy expenditure required for the weight goal.    Admit Weight  93 lb 0.6 oz (42.2 kg)    Goal Weight: Short Term  96  lb 8 oz (43.8 kg)    Goal Weight: Long Term  100 lb (45.4 kg)    Expected Outcomes  Short Term: Continue to assess and modify interventions until short term weight is achieved;Long Term: Adherence to nutrition and physical activity/exercise program aimed toward attainment of established weight goal;Understanding of distribution of calorie intake throughout the day with the consumption of 4-5 meals/snacks;Weight Gain: Understanding of general recommendations for a high calorie, high protein meal plan that promotes weight gain by distributing calorie intake throughout the day with the consumption for 4-5 meals, snacks, and/or supplements    Improve shortness of breath with ADL's  Yes    Intervention  Provide education, individualized exercise plan and daily activity instruction to help decrease symptoms of SOB with activities of daily living.    Expected Outcomes  Short Term: Improve cardiorespiratory fitness to achieve a reduction of symptoms when performing ADLs;Long Term: Be able to perform more ADLs without symptoms or delay the onset of symptoms    Stress  Yes    Intervention  Offer individual and/or small group education and counseling on adjustment to heart disease, stress management and health-related lifestyle change. Teach and support self-help strategies.    Expected Outcomes  Short Term: Participant demonstrates changes in health-related behavior, relaxation and other stress  management skills, ability to obtain effective social support, and compliance with psychotropic medications if prescribed.;Long Term: Emotional wellbeing is indicated by absence of clinically significant psychosocial distress or social isolation.       Core Components/Risk Factors/Patient Goals Review:  Goals and Risk Factor Review    Row Name 08/27/17 1626 08/28/17 0929           Core Components/Risk Factors/Patient Goals Review   Personal Goals Review  Weight Management/Obesity;Improve shortness of breath with ADL's;Increase knowledge of respiratory medications and ability to use respiratory devices properly.;Develop more efficient breathing techniques such as purse lipped breathing and diaphragmatic breathing and practicing self-pacing with activity.;Stress  -      Review  I do anticipate in the next 30 days pt will make progress toward goals.  -      Expected Outcomes  -  See Admission Goals/Outcomes         Core Components/Risk Factors/Patient Goals at Discharge (Final Review):  Goals and Risk Factor Review - 08/28/17 0929      Core Components/Risk Factors/Patient Goals Review   Expected Outcomes  See Admission Goals/Outcomes       ITP Comments: ITP Comments    Row Name 08/09/17 [phone] 08/28/17 0928         ITP Comments  Dr Jennet Maduro, Medical Director  Dr Jennet Maduro, Medical Director         Comments:  Pt has completed 3 exercise sessions. Cherre Huger, BSN Cardiac and Training and development officer

## 2017-08-27 NOTE — Progress Notes (Signed)
Daily Session Note  Patient Details  Name: Mia Hernandez MRN: [id number] Date of Birth: November 15, 1940 Referring Provider:     PULMONARY REHAB OTHER RESPIRATORY from 08/13/2017 in [place]  Referring Provider  Dr. Annamaria Boots      Encounter Date: 08/27/2017  Check In: Session Check In - 08/27/17 1231      Check-In   Supervising physician immediately available to respond to emergencies  [facility] immediately available    Physician(s)  Dr. Florene Glen    Location  [facility]    Staff Present  Su Hilt, MS, ACSM RCEP, Exercise Physiologist;Carlette Wilber Oliphant, RN, BSN;Ramon Dredge, RN, MHA;Lisa Ysidro Evert, RN    Medication changes reported      No    Fall or balance concerns reported     No    Tobacco Cessation  No Change    Warm-up and Cool-down  Performed as group-led instruction    Resistance Training Performed  Yes    VAD Patient?  No    PAD/SET Patient?  No      Pain Assessment   Currently in Pain?  No/denies       Capillary Blood Glucose: No results found for this or any previous visit (from the past 24 hour(s)).  Exercise Prescription Changes - 08/27/17 1200      Response to Exercise   Blood Pressure (Admit)  128/58    Blood Pressure (Exercise)  158/82    Blood Pressure (Exit)  104/72    Heart Rate (Admit)  84 bpm    Heart Rate (Exercise)  99 bpm    Heart Rate (Exit)  84 bpm    Oxygen Saturation (Admit)  98 %    Oxygen Saturation (Exercise)  92 %    Oxygen Saturation (Exit)  97 %    Rating of Perceived Exertion (Exercise)  13    Perceived Dyspnea (Exercise)  1    Duration  Progress to 45 minutes of aerobic exercise without signs/symptoms of physical distress    Intensity  THRR unchanged      Progression   Progression  Continue to progress workloads to maintain intensity without signs/symptoms of physical distress.      Resistance Training   Training Prescription  Yes    Weight  orange bands    Reps  10-15       Recumbant Bike   Level  1    Watts  10    Minutes  17      NuStep   Level  2    SPM  80    Minutes  17    METs  1.6      Track   Laps  14    Minutes  17       Social History   Tobacco Use  Smoking Status Never Smoker  Smokeless Tobacco Never Used    Goals Met:  Exercise tolerated well Personal goals reviewed  Goals Unmet:  Not Applicable  Comments: Service time is from 10:30a to 12:30p    Dr. Rush Farmer is Medical Director for Pulmonary Rehab at [facility].

## 2017-08-28 ENCOUNTER — Ambulatory Visit: Payer: Medicare Other | Admitting: Infectious Disease

## 2017-08-29 ENCOUNTER — Encounter (HOSPITAL_COMMUNITY)
Admission: RE | Admit: 2017-08-29 | Discharge: 2017-08-29 | Disposition: A | Discharge status: Home / Self Care | Payer: Medicare Other | Source: Ambulatory Visit | Attending: Internal Medicine | Admitting: Internal Medicine

## 2017-08-29 DIAGNOSIS — F419 Anxiety disorder, unspecified: Secondary | ICD-10-CM | POA: Diagnosis not present

## 2017-08-29 DIAGNOSIS — I10 Essential (primary) hypertension: Secondary | ICD-10-CM | POA: Diagnosis not present

## 2017-08-29 DIAGNOSIS — M1990 Unspecified osteoarthritis, unspecified site: Secondary | ICD-10-CM | POA: Diagnosis not present

## 2017-08-29 DIAGNOSIS — J479 Bronchiectasis, uncomplicated: Secondary | ICD-10-CM | POA: Diagnosis not present

## 2017-08-29 DIAGNOSIS — Z85828 Personal history of other malignant neoplasm of skin: Secondary | ICD-10-CM | POA: Diagnosis not present

## 2017-08-29 DIAGNOSIS — Z79899 Other long term (current) drug therapy: Secondary | ICD-10-CM | POA: Diagnosis not present

## 2017-08-29 NOTE — Progress Notes (Signed)
Daily Session Note  Patient Details  Name: Mia Hernandez MRN: [id number] Date of Birth: 1940/07/30 Referring Provider:     PULMONARY REHAB OTHER RESPIRATORY from 08/13/2017 in [place]  Referring Provider  Dr. Annamaria Boots      Encounter Date: 08/29/2017  Check In: Session Check In - 08/29/17 1231      Check-In   Supervising physician immediately available to respond to emergencies  [facility] immediately available    Physician(s)  Dr. Algis Liming    Location  [facility]    Staff Present  Su Hilt, MS, ACSM RCEP, Exercise Physiologist;Carlette Wilber Oliphant, RN, BSN;Ramon Dredge, RN, MHA;Caytlyn Evers Ysidro Evert, RN    Medication changes reported      No    Fall or balance concerns reported     No    Tobacco Cessation  No Change    Warm-up and Cool-down  Performed as group-led instruction    Resistance Training Performed  Yes    VAD Patient?  No    PAD/SET Patient?  No      Pain Assessment   Currently in Pain?  No/denies    Multiple Pain Sites  No       Capillary Blood Glucose: No results found for this or any previous visit (from the past 24 hour(s)).    Social History   Tobacco Use  Smoking Status Never Smoker  Smokeless Tobacco Never Used    Goals Met:  Exercise tolerated well No report of cardiac concerns or symptoms Strength training completed today  Goals Unmet:  Not Applicable  Comments:Service time is from 1030 to 1210    Dr. Rush Farmer is Medical Director for Pulmonary Rehab at [facility].

## 2017-09-03 ENCOUNTER — Encounter (HOSPITAL_COMMUNITY)
Admission: RE | Admit: 2017-09-03 | Discharge: 2017-09-03 | Disposition: A | Discharge status: Home / Self Care | Payer: Medicare Other | Source: Ambulatory Visit | Attending: Internal Medicine | Admitting: Internal Medicine

## 2017-09-03 DIAGNOSIS — F419 Anxiety disorder, unspecified: Secondary | ICD-10-CM | POA: Diagnosis not present

## 2017-09-03 DIAGNOSIS — I10 Essential (primary) hypertension: Secondary | ICD-10-CM | POA: Diagnosis not present

## 2017-09-03 DIAGNOSIS — J479 Bronchiectasis, uncomplicated: Secondary | ICD-10-CM | POA: Diagnosis not present

## 2017-09-03 DIAGNOSIS — M1990 Unspecified osteoarthritis, unspecified site: Secondary | ICD-10-CM | POA: Diagnosis not present

## 2017-09-03 DIAGNOSIS — Z85828 Personal history of other malignant neoplasm of skin: Secondary | ICD-10-CM | POA: Diagnosis not present

## 2017-09-03 DIAGNOSIS — Z79899 Other long term (current) drug therapy: Secondary | ICD-10-CM | POA: Diagnosis not present

## 2017-09-03 NOTE — Progress Notes (Signed)
Daily Session Note  Patient Details  Name: Mia Hernandez MRN: [id number] Date of Birth: July 28, 1940 Referring Provider:     PULMONARY REHAB OTHER RESPIRATORY from 08/13/2017 in [place]  Referring Provider  Dr. Annamaria Boots      Encounter Date: 09/03/2017  Check In: Session Check In - 09/03/17 1158      Check-In   Supervising physician immediately available to respond to emergencies  [facility] immediately available    Physician(s)  Dr. Jonnie Finner     Location  [facility]    Staff Present  Su Hilt, MS, ACSM RCEP, Exercise Physiologist;Carlette Wilber Oliphant, RN, BSN;Lisa Ysidro Evert, RN;Olinty [place], MS, ACSM CEP, Exercise Physiologist;Joann Rion, RN, BSN    Medication changes reported      No    Fall or balance concerns reported     No    Tobacco Cessation  No Change    Warm-up and Cool-down  Performed as group-led instruction    Resistance Training Performed  Yes    VAD Patient?  No    PAD/SET Patient?  No       Capillary Blood Glucose: No results found for this or any previous visit (from the past 24 hour(s)).    Social History   Tobacco Use  Smoking Status Never Smoker  Smokeless Tobacco Never Used    Goals Met:  Exercise tolerated well  Goals Unmet:  Not Applicable  Comments: Service time is from 10:30a to 12:15p    Dr. Rush Farmer is Medical Director for Pulmonary Rehab at [facility]

## 2017-09-05 ENCOUNTER — Encounter (HOSPITAL_COMMUNITY)
Admission: RE | Admit: 2017-09-05 | Discharge: 2017-09-05 | Disposition: A | Discharge status: Home / Self Care | Payer: Medicare Other | Source: Ambulatory Visit | Attending: Internal Medicine | Admitting: Internal Medicine

## 2017-09-05 DIAGNOSIS — M1990 Unspecified osteoarthritis, unspecified site: Secondary | ICD-10-CM | POA: Diagnosis not present

## 2017-09-05 DIAGNOSIS — Z85828 Personal history of other malignant neoplasm of skin: Secondary | ICD-10-CM | POA: Diagnosis not present

## 2017-09-05 DIAGNOSIS — J479 Bronchiectasis, uncomplicated: Secondary | ICD-10-CM

## 2017-09-05 DIAGNOSIS — Z79899 Other long term (current) drug therapy: Secondary | ICD-10-CM | POA: Diagnosis not present

## 2017-09-05 DIAGNOSIS — F419 Anxiety disorder, unspecified: Secondary | ICD-10-CM | POA: Diagnosis not present

## 2017-09-05 DIAGNOSIS — I10 Essential (primary) hypertension: Secondary | ICD-10-CM | POA: Diagnosis not present

## 2017-09-05 NOTE — Progress Notes (Signed)
Daily Session Note  Patient Details  Name: Mia Hernandez MRN: [id number] Date of Birth: 03/21/1940 Referring Provider:     PULMONARY REHAB OTHER RESPIRATORY from 08/13/2017 in [facility]  Referring Provider  Dr. Young      Encounter Date: 09/05/2017  Check In: Session Check In - 09/05/17 0955      Check-In   Supervising physician immediately available to respond to emergencies  [facility] immediately available    Physician(s)  Dr. Powell    Location  [facility]    Staff Present   , MS, ACSM RCEP, Exercise Physiologist;Annedrea Stackhouse, RN, MHA;Carlette Carlton, RN, BSN    Medication changes reported      No    Fall or balance concerns reported     No    Tobacco Cessation  No Change    Warm-up and Cool-down  Performed as group-led instruction    Resistance Training Performed  Yes    VAD Patient?  No      Pain Assessment   Currently in Pain?  No/denies    Multiple Pain Sites  No       Capillary Blood Glucose: No results found for this or any previous visit (from the past 24 hour(s)).    Social History   Tobacco Use  Smoking Status Never Smoker  Smokeless Tobacco Never Used    Goals Met:  Exercise tolerated well  Goals Unmet:  Not Applicable  Comments: Service time is from 10:30a to 12:45p    Dr. Wesam G. Yacoub is Medical Director for Pulmonary Rehab at [facility]. 

## 2017-09-10 ENCOUNTER — Encounter (HOSPITAL_COMMUNITY)
Admission: RE | Admit: 2017-09-10 | Discharge: 2017-09-10 | Disposition: A | Discharge status: Home / Self Care | Payer: Medicare Other | Source: Ambulatory Visit | Attending: Internal Medicine | Admitting: Internal Medicine

## 2017-09-10 VITALS — Wt 94.6 lb

## 2017-09-10 DIAGNOSIS — J479 Bronchiectasis, uncomplicated: Secondary | ICD-10-CM | POA: Insufficient documentation

## 2017-09-10 DIAGNOSIS — M810 Age-related osteoporosis without current pathological fracture: Secondary | ICD-10-CM | POA: Diagnosis not present

## 2017-09-10 DIAGNOSIS — I10 Essential (primary) hypertension: Secondary | ICD-10-CM | POA: Insufficient documentation

## 2017-09-10 DIAGNOSIS — Z79899 Other long term (current) drug therapy: Secondary | ICD-10-CM | POA: Insufficient documentation

## 2017-09-10 DIAGNOSIS — M1990 Unspecified osteoarthritis, unspecified site: Secondary | ICD-10-CM | POA: Diagnosis not present

## 2017-09-10 DIAGNOSIS — Z85828 Personal history of other malignant neoplasm of skin: Secondary | ICD-10-CM | POA: Insufficient documentation

## 2017-09-10 DIAGNOSIS — F419 Anxiety disorder, unspecified: Secondary | ICD-10-CM | POA: Diagnosis not present

## 2017-09-10 DIAGNOSIS — Z8673 Personal history of transient ischemic attack (TIA), and cerebral infarction without residual deficits: Secondary | ICD-10-CM | POA: Insufficient documentation

## 2017-09-10 NOTE — Progress Notes (Signed)
Daily Session Note  Patient Details  Name: Mia Hernandez MRN: [id number] Date of Birth: 11/05/1940 Referring Provider:     PULMONARY REHAB OTHER RESPIRATORY from 08/13/2017 in [facility]  Referring Provider  Dr. Young      Encounter Date: 09/10/2017  Check In: Session Check In - 09/10/17 1220      Check-In   Supervising physician immediately available to respond to emergencies  [facility] immediately available    Physician(s)  Dr. Akula    Location  [facility]    Staff Present   , MS, ACSM RCEP, Exercise Physiologist;Annedrea Stackhouse, RN, MHA;Carlette Carlton, RN, BSN;Lisa Hughes, RN    Medication changes reported      No    Fall or balance concerns reported     No    Tobacco Cessation  No Change    Warm-up and Cool-down  Performed as group-led instruction    Resistance Training Performed  Yes    VAD Patient?  No    PAD/SET Patient?  No      Pain Assessment   Currently in Pain?  No/denies    Multiple Pain Sites  No       Capillary Blood Glucose: No results found for this or any previous visit (from the past 24 hour(s)).  Exercise Prescription Changes - 09/10/17 1200      Response to Exercise   Blood Pressure (Admit)  102/70    Blood Pressure (Exercise)  120/70    Blood Pressure (Exit)  108/60    Heart Rate (Admit)  82 bpm    Heart Rate (Exercise)  81 bpm    Heart Rate (Exit)  87 bpm    Oxygen Saturation (Admit)  99 %    Oxygen Saturation (Exercise)  97 %    Oxygen Saturation (Exit)  94 %    Rating of Perceived Exertion (Exercise)  13    Perceived Dyspnea (Exercise)  0    Duration  Progress to 45 minutes of aerobic exercise without signs/symptoms of physical distress    Intensity  THRR unchanged      Progression   Progression  Continue to progress workloads to maintain intensity without signs/symptoms of physical distress.      Resistance Training   Training Prescription  Yes    Weight   orange bands    Reps  10-15      Recumbant Bike   Level  2    Watts  10    Minutes  17      NuStep   Level  2    SPM  80    Minutes  17    METs  1.3      Track   Laps  15    Minutes  17       Social History   Tobacco Use  Smoking Status Never Smoker  Smokeless Tobacco Never Used    Goals Met:  Personal goals reviewed  Goals Unmet:  Not Applicable  Comments: Service time is from 10:30a to 12:15p    Dr. Wesam G. Yacoub is Medical Director for Pulmonary Rehab at [facility]. 

## 2017-09-12 ENCOUNTER — Encounter (HOSPITAL_COMMUNITY)
Admission: RE | Admit: 2017-09-12 | Discharge: 2017-09-12 | Disposition: A | Discharge status: Home / Self Care | Payer: Medicare Other | Source: Ambulatory Visit | Attending: Internal Medicine | Admitting: Internal Medicine

## 2017-09-12 DIAGNOSIS — F419 Anxiety disorder, unspecified: Secondary | ICD-10-CM | POA: Diagnosis not present

## 2017-09-12 DIAGNOSIS — J479 Bronchiectasis, uncomplicated: Secondary | ICD-10-CM

## 2017-09-12 DIAGNOSIS — Z79899 Other long term (current) drug therapy: Secondary | ICD-10-CM | POA: Diagnosis not present

## 2017-09-12 DIAGNOSIS — M1990 Unspecified osteoarthritis, unspecified site: Secondary | ICD-10-CM | POA: Diagnosis not present

## 2017-09-12 DIAGNOSIS — Z85828 Personal history of other malignant neoplasm of skin: Secondary | ICD-10-CM | POA: Diagnosis not present

## 2017-09-12 DIAGNOSIS — I10 Essential (primary) hypertension: Secondary | ICD-10-CM | POA: Diagnosis not present

## 2017-09-12 NOTE — Progress Notes (Signed)
Daily Session Note  Patient Details  Name: Mia Hernandez MRN: [id number] Date of Birth: Jun 14, 1940 Referring Provider:     PULMONARY REHAB OTHER RESPIRATORY from 08/13/2017 in [place]  Referring Provider  Dr. Annamaria Boots      Encounter Date: 09/12/2017  Check In: Session Check In - 09/12/17 1134      Check-In   Supervising physician immediately available to respond to emergencies  [facility] immediately available    Physician(s)  Dr. Darrick Meigs     Location  [facility]    Staff Present  Rodney Langton, RN;Carlette Wilber Oliphant, RN, BSN;Joann Rion, RN, BSN;Ramon Dredge, RN, MHA    Medication changes reported      No    Fall or balance concerns reported     No    Tobacco Cessation  No Change    Warm-up and Cool-down  Performed as group-led instruction    Resistance Training Performed  No    VAD Patient?  No    PAD/SET Patient?  No      Pain Assessment   Currently in Pain?  No/denies       Capillary Blood Glucose: No results found for this or any previous visit (from the past 24 hour(s)).    Social History   Tobacco Use  Smoking Status Never Smoker  Smokeless Tobacco Never Used    Goals Met:  Exercise tolerated well No report of cardiac concerns or symptoms Strength training completed today  Goals Unmet:  Not Applicable  Comments: Service time is from 1030 to 1155    Dr. Rush Farmer is Medical Director for Pulmonary Rehab at [facility]

## 2017-09-17 ENCOUNTER — Encounter (HOSPITAL_COMMUNITY)
Admission: RE | Admit: 2017-09-17 | Discharge: 2017-09-17 | Disposition: A | Discharge status: Home / Self Care | Payer: Medicare Other | Source: Ambulatory Visit | Attending: Internal Medicine | Admitting: Internal Medicine

## 2017-09-17 DIAGNOSIS — J479 Bronchiectasis, uncomplicated: Secondary | ICD-10-CM

## 2017-09-17 DIAGNOSIS — Z79899 Other long term (current) drug therapy: Secondary | ICD-10-CM | POA: Diagnosis not present

## 2017-09-17 DIAGNOSIS — M1990 Unspecified osteoarthritis, unspecified site: Secondary | ICD-10-CM | POA: Diagnosis not present

## 2017-09-17 DIAGNOSIS — F419 Anxiety disorder, unspecified: Secondary | ICD-10-CM | POA: Diagnosis not present

## 2017-09-17 DIAGNOSIS — Z85828 Personal history of other malignant neoplasm of skin: Secondary | ICD-10-CM | POA: Diagnosis not present

## 2017-09-17 DIAGNOSIS — I10 Essential (primary) hypertension: Secondary | ICD-10-CM | POA: Diagnosis not present

## 2017-09-17 NOTE — Progress Notes (Signed)
Daily Session Note  Patient Details  Name: Mia Hernandez MRN: [id number] Date of Birth: September 12, 1940 Referring Provider:     PULMONARY REHAB OTHER RESPIRATORY from 08/13/2017 in [place]  Referring Provider  Dr. Annamaria Boots      Encounter Date: 09/17/2017  Check In: Session Check In - 09/17/17 1125      Check-In   Supervising physician immediately available to respond to emergencies  [facility] immediately available    Physician(s)  Dr. Darrick Meigs    Location  [facility]    Staff Present  Maurice Small, RN, BSN;Willow Reczek, MS, ACSM RCEP, Exercise Physiologist;Lisa Ysidro Evert, RN    Medication changes reported      No    Fall or balance concerns reported     No    Tobacco Cessation  No Change    Warm-up and Cool-down  Performed as group-led instruction    Resistance Training Performed  Yes    VAD Patient?  No    PAD/SET Patient?  No      Pain Assessment   Currently in Pain?  No/denies    Multiple Pain Sites  No       Capillary Blood Glucose: No results found for this or any previous visit (from the past 24 hour(s)).    Social History   Tobacco Use  Smoking Status Never Smoker  Smokeless Tobacco Never Used    Goals Met:  Exercise tolerated well  Goals Unmet:  Not Applicable  Comments: Service time is from 10:30a to 12:20p    Dr. Rush Farmer is Medical Director for Pulmonary Rehab at [facility].

## 2017-09-19 ENCOUNTER — Encounter (HOSPITAL_COMMUNITY)
Admission: RE | Admit: 2017-09-19 | Discharge: 2017-09-19 | Disposition: A | Discharge status: Home / Self Care | Payer: Medicare Other | Source: Ambulatory Visit | Attending: Internal Medicine | Admitting: Internal Medicine

## 2017-09-19 DIAGNOSIS — J479 Bronchiectasis, uncomplicated: Secondary | ICD-10-CM | POA: Diagnosis not present

## 2017-09-19 DIAGNOSIS — M1990 Unspecified osteoarthritis, unspecified site: Secondary | ICD-10-CM | POA: Diagnosis not present

## 2017-09-19 DIAGNOSIS — Z85828 Personal history of other malignant neoplasm of skin: Secondary | ICD-10-CM | POA: Diagnosis not present

## 2017-09-19 DIAGNOSIS — F419 Anxiety disorder, unspecified: Secondary | ICD-10-CM | POA: Diagnosis not present

## 2017-09-19 DIAGNOSIS — I10 Essential (primary) hypertension: Secondary | ICD-10-CM | POA: Diagnosis not present

## 2017-09-19 DIAGNOSIS — Z79899 Other long term (current) drug therapy: Secondary | ICD-10-CM | POA: Diagnosis not present

## 2017-09-19 NOTE — Progress Notes (Signed)
Daily Session Note  Patient Details  Name: Mia Hernandez MRN: [id number] Date of Birth: [birth year] Referring Provider:     PULMONARY REHAB OTHER RESPIRATORY from 08/13/2017 in [place]  Referring Provider  Dr. Annamaria Boots      Encounter Date: 09/19/2017  Check In: Session Check In - 09/19/17 1225      Check-In   Supervising physician immediately available to respond to emergencies  [facility] immediately available    Physician(s)  Dr. Dyann Kief    Location  [facility]    Staff Present  Maurice Small, RN, BSN;Molly DiVincenzo, MS, ACSM RCEP, Exercise Physiologist;Shala Baumbach Ysidro Evert, Felipe Drone, RN, MHA    Medication changes reported      No    Fall or balance concerns reported     No    Tobacco Cessation  No Change    Warm-up and Cool-down  Performed as group-led instruction    Resistance Training Performed  Yes    VAD Patient?  No    PAD/SET Patient?  No      Pain Assessment   Currently in Pain?  No/denies    Pain Score  0-No pain    Multiple Pain Sites  No       Capillary Blood Glucose: No results found for this or any previous visit (from the past 24 hour(s)).    Social History   Tobacco Use  Smoking Status Never Smoker  Smokeless Tobacco Never Used    Goals Met:  Exercise tolerated well No report of cardiac concerns or symptoms Strength training completed today  Goals Unmet:  Not Applicable  Comments: Service time is from 1030 to 1205    Dr. Rush Farmer is Medical Director for Pulmonary Rehab at [facility].

## 2017-09-20 ENCOUNTER — Encounter: Payer: Self-pay | Admitting: Cardiovascular Disease

## 2017-09-20 ENCOUNTER — Ambulatory Visit (INDEPENDENT_AMBULATORY_CARE_PROVIDER_SITE_OTHER): Payer: Medicare Other | Admitting: Cardiovascular Disease

## 2017-09-20 VITALS — BP 148/80 | HR 81 | Ht 59.5 in | Wt 93.4 lb

## 2017-09-20 DIAGNOSIS — A310 Pulmonary mycobacterial infection: Secondary | ICD-10-CM

## 2017-09-20 DIAGNOSIS — I10 Essential (primary) hypertension: Secondary | ICD-10-CM | POA: Diagnosis not present

## 2017-09-20 MED ORDER — IRBESARTAN 150 MG PO TABS: 150.0000 mg | ORAL_TABLET | Freq: Every day | ORAL | 3 refills | Status: DC

## 2017-09-20 NOTE — Patient Instructions (Signed)
Dr Croitoru recommends that you schedule a follow-up appointment in 12 months. You will receive a reminder letter in the mail two months in advance. If you don't receive a letter, please call our office to schedule the follow-up appointment.  If you need a refill on your cardiac medications before your next appointment, please call your pharmacy. 

## 2017-09-20 NOTE — Progress Notes (Signed)
Cardiology Office Note    Date:  09/20/2017   ID:  Manley Mason, DOB 1940-09-14, MRN [id number]  PCP:  Midge Minium, MD  Cardiologist:   Sanda Klein, MD   No chief complaint on file.   History of Present Illness:  Mia Hernandez is a 77 y.o. 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 Echo and ECG subsequently normalized after discontinuation of medicines.   Although she initially improved following her partial lung resection in September 2016, her mycobacterium infection is still active. She is now taking azithromycin for several months without any evidence of myocardial side effects.  She has a chronic productive cough (" right lung fills up all the time"), but has not had hemoptysis in a long time.  She complains of "irritable bowel syndrome" which puts pressure on her diaphragm.  She is going to pulmonary rehab and enjoys it a lot.  She thinks it is helping.  Her blood pressures checked before during and after exercise at rehab and it is consistently 110-120/70-80 range.  She is only taking irbesartan for blood pressure. It seems to be working well for her.  She had difficulty with multiple other medications.  She denies exertional angina and remains active despite the cough.  She does not complain of dyspnea.  She has not had leg edema, palpitations, dizziness or syncope.  She is seriously underweight, but her weight has stabilized at around 93 pounds.  Past Medical History:  Diagnosis Date  . Acute left eye pain 11/15/2014  . Adverse effect of general anesthetic    DELERIUM  . ALLERGIC RHINITIS   . Anxiety   . Arthritis   . Bronchiectasis ([place])   . Cancer ([place])    basal cell skin cancer removed  . Chronic back  pain   . Complication of anesthesia    s/p lung surgery at [facility] X 2 MONTHS  . Diverticulosis   . Dysphagia 10/13/2014  . Hydropneumothorax 11/15/2014  . Hypertension   . Hyponatremia 09/15/2014  . Internal hemorrhoids   . Irritable bowel syndrome with constipation   . Mycobacterium avium-intracellulare infection ([place])   . 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 Osteoporosis   . Stroke ([place])    TIA  SEVERAL YEARS AGO X 1 (21YRS -30 YRS AGO)  . Subcutaneous emphysema ([place]) 10/13/2014  . Toe pain 11/15/2014    Past Surgical History:  Procedure Laterality Date  . BREAST BIOPSY    . BREAST LUMPECTOMY WITH RADIOACTIVE SEED LOCALIZATION Left 10/05/2015   Procedure: LEFT BREAST LUMPECTOMY WITH RADIOACTIVE SEED LOCALIZATION;  Surgeon: Excell Seltzer, MD;  Location: [facility];  Service: General;  Laterality: Left;  . CATARACT EXTRACTION W/ INTRAOCULAR LENS  IMPLANT, BILATERAL    . CHOLECYSTECTOMY    . LAMINECTOMY     cervical   . VAGINAL HYSTERECTOMY      Current Medications: Outpatient Medications Prior to Visit  Medication Sig Dispense Refill  . azithromycin (ZITHROMAX) 250 MG tablet Take by mouth daily.    . irbesartan (AVAPRO) 150 MG tablet Take 1 tablet (150 mg total) by mouth daily. KEEP OV. 90 tablet 0  . irbesartan (AVAPRO) 150 MG tablet Take 1 tablet (150 mg total) by mouth daily. 90 tablet 0  No facility-administered medications prior to visit.      Allergies:   Ethambutol; Latex; Penicillins; Prednisone; Rifabutin; Aspirin; Betadine [povidone iodine]; Codeine; Doxycycline; Iodinated diagnostic agents; Macrolides and ketolides; Morphine and related; Sulfa antibiotics; and Azithromycin   Social History   Socioeconomic History  . Marital status: Married    Spouse name: Not on file  . Number of children: Not on file  . Years of education: Not on file  . Highest education level: Not on  file  Occupational History  . Occupation: retired  Scientific laboratory technician  . Financial resource strain: Not on file  . Food insecurity:    Worry: Not on file    Inability: Not on file  . Transportation needs:    Medical: Not on file    Non-medical: Not on file  Tobacco Use  . Smoking status: Never Smoker  . Smokeless tobacco: Never Used  Substance and Sexual Activity  . Alcohol use: No  . Drug use: No  . Sexual activity: Not on file  Lifestyle  . Physical activity:    Days per week: Not on file    Minutes per session: Not on file  . Stress: Not on file  Relationships  . Social connections:    Talks on phone: Not on file    Gets together: Not on file    Attends religious service: Not on file    Active member of club or organization: Not on file    Attends meetings of clubs or organizations: Not on file    Relationship status: Not on file  Other Topics Concern  . Not on file  Social History Narrative  . Not on file     Family History:  The patient's family history includes Breast cancer in her mother; Pulmonary embolism in her brother; Stroke in her father.   ROS:   Please see the history of present illness.    ROS All other systems reviewed and are negative.   PHYSICAL EXAM:   VS:  BP (!) 148/80 (BP Location: Left Arm, Patient Position: Sitting, Cuff Size: Normal)   Pulse 81   Ht 4' 11.5" (1.511 m)   Wt 93 lb 6.4 oz (42.4 kg)   BMI 18.55 kg/m     General: Alert, oriented x3, no distress, malnourished Head: no evidence of trauma, PERRL, EOMI, no exophtalmos or lid lag, no myxedema, no xanthelasma; normal ears, nose and oropharynx Neck: normal jugular venous pulsations and no hepatojugular reflux; brisk carotid pulses without delay and no carotid bruits Chest: clear to auscultation, no signs of consolidation by percussion or palpation, normal fremitus, symmetrical and full respiratory excursions Cardiovascular: normal position and quality of the apical impulse, regular  rhythm, normal first and second heart sounds, no murmurs, rubs or gallops Abdomen: no tenderness or distention, no masses by palpation, no abnormal pulsatility or arterial bruits, normal bowel sounds, no hepatosplenomegaly Extremities: no clubbing, cyanosis or edema; 2+ radial, ulnar and brachial pulses bilaterally; 2+ right femoral, posterior tibial and dorsalis pedis pulses; 2+ left femoral, posterior tibial and dorsalis pedis pulses; no subclavian or femoral bruits Neurological: grossly nonfocal Psych: Normal mood and affect   Wt Readings from Last 3 Encounters:  09/20/17 93 lb 6.4 oz (42.4 kg)  09/10/17 94 lb 9.2 oz (42.9 kg)  08/09/17 93 lb 0.6 oz (42.2 kg)      Studies/Labs Reviewed:   EKG:  EKG is ordered today.  It is unchanged from previous tracings showing sinus rhythm, possible left atrial abnormality, incomplete  right bundle branch block, normal QTC  Recent Labs: 11/01/2016: TSH 1.41 08/02/2017: ALT 30; BUN 9; Creatinine, Ser 0.46; Hemoglobin 12.6; Platelets 451.0; Potassium 4.1; Sodium 129   Lipid Panel    Component Value Date/Time   CHOL 187 11/01/2016 1127   TRIG 52.0 11/01/2016 1127   HDL 81.00 11/01/2016 1127   CHOLHDL 2 11/01/2016 1127   VLDL 10.4 11/01/2016 1127   LDLCALC 96 11/01/2016 1127    ASSESSMENT:    1. Essential (primary) hypertension   2. MAI (mycobacterium avium-intracellulare) ([place])      PLAN:  In order of problems listed above:  1. HTN: Excellent control with frequent testing of pulmonary rehab.  Continue irbesartan at current dose. 2. 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 This has resolved completely by clinical, echo and ECG criteria.  Currently tolerating azithromycin monotherapy without evidence of cardiac side effects.    Medication Adjustments/Labs  and Tests Ordered: Current medicines are reviewed at length with the patient today.  Concerns regarding medicines are outlined above.  Medication changes, Labs and Tests ordered today are listed in the Patient Instructions below. Patient Instructions  Dr Sallyanne Kuster recommends that you schedule a follow-up appointment in 12 months. You will receive a reminder letter in the mail two months in advance. If you don't receive a letter, please call our office to schedule the follow-up appointment.  If you need a refill on your cardiac medications before your next appointment, please call your pharmacy.    Signed, Sanda Klein, MD  09/20/2017 5:44 PM    [address], [place], [place]  [address] Phone: [phone]; Fax: [phone]

## 2017-09-24 ENCOUNTER — Encounter (HOSPITAL_COMMUNITY)
Admission: RE | Admit: 2017-09-24 | Discharge: 2017-09-24 | Disposition: A | Discharge status: Home / Self Care | Payer: Medicare Other | Source: Ambulatory Visit | Attending: Internal Medicine | Admitting: Internal Medicine

## 2017-09-24 VITALS — Wt 94.1 lb

## 2017-09-24 DIAGNOSIS — I10 Essential (primary) hypertension: Secondary | ICD-10-CM | POA: Diagnosis not present

## 2017-09-24 DIAGNOSIS — Z79899 Other long term (current) drug therapy: Secondary | ICD-10-CM | POA: Diagnosis not present

## 2017-09-24 DIAGNOSIS — Z85828 Personal history of other malignant neoplasm of skin: Secondary | ICD-10-CM | POA: Diagnosis not present

## 2017-09-24 DIAGNOSIS — J479 Bronchiectasis, uncomplicated: Secondary | ICD-10-CM

## 2017-09-24 DIAGNOSIS — M1990 Unspecified osteoarthritis, unspecified site: Secondary | ICD-10-CM | POA: Diagnosis not present

## 2017-09-24 DIAGNOSIS — F419 Anxiety disorder, unspecified: Secondary | ICD-10-CM | POA: Diagnosis not present

## 2017-09-24 NOTE — Progress Notes (Signed)
Daily Session Note  Patient Details  Name: Mia Hernandez MRN: [id number] Date of Birth: 26-Nov-1940 Referring Provider:     PULMONARY REHAB OTHER RESPIRATORY from 08/13/2017 in [facility]  Referring Provider  Dr. Annamaria Boots      Encounter Date: 09/24/2017  Check In: Session Check In - 09/24/17 1240      Check-In   Supervising physician immediately available to respond to emergencies  [facility] immediately available    Physician(s)  Dr. Sloan Leiter    Location  [facility]    Staff Present  Maurice Small, RN, BSN;Brigida Scotti, MS, ACSM RCEP, Exercise Physiologist;Lisa Ysidro Evert, Felipe Drone, RN, MHA    Medication changes reported      No    Fall or balance concerns reported     No    Tobacco Cessation  No Change    Warm-up and Cool-down  Performed as group-led instruction    Resistance Training Performed  Yes    VAD Patient?  No    PAD/SET Patient?  No      Pain Assessment   Currently in Pain?  No/denies    Multiple Pain Sites  No       Capillary Blood Glucose: No results found for this or any previous visit (from the past 24 hour(s)).  Exercise Prescription Changes - 09/24/17 1300      Response to Exercise   Blood Pressure (Admit)  130/80    Blood Pressure (Exercise)  140/80    Blood Pressure (Exit)  118/80    Heart Rate (Admit)  84 bpm    Heart Rate (Exercise)  98 bpm    Heart Rate (Exit)  72 bpm    Oxygen Saturation (Admit)  95 %    Oxygen Saturation (Exercise)  94 %    Oxygen Saturation (Exit)  95 %    Rating of Perceived Exertion (Exercise)  13    Perceived Dyspnea (Exercise)  1    Duration  Progress to 45 minutes of aerobic exercise without signs/symptoms of physical distress    Intensity  THRR unchanged      Progression   Progression  Continue to progress workloads to maintain intensity without signs/symptoms of physical distress.      Resistance Training   Training Prescription  Yes    Weight   orange bands    Reps  10-15      Recumbant Bike   Level  2    Watts  10    Minutes  17      NuStep   Level  3    SPM  80    Minutes  17    METs  2      Track   Laps  16    Minutes  17       Social History   Tobacco Use  Smoking Status Never Smoker  Smokeless Tobacco Never Used    Goals Met:  Exercise tolerated well  Goals Unmet:  Not Applicable  Comments: Service time is from 10:30a to 12:10p    Dr. Rush Farmer is Medical Director for Pulmonary Rehab at [facility].

## 2017-09-25 LAB — RESPIRATORY CULTURE OR RESPIRATORY AND SPUTUM CULTURE
MICRO NUMBER: 90905551
RESULT:: NORMAL
SPECIMEN QUALITY: ADEQUATE

## 2017-09-25 LAB — FUNGUS CULTURE W SMEAR
MICRO NUMBER:: 90905549
SPECIMEN QUALITY:: ADEQUATE

## 2017-09-25 LAB — MYCOBACTERIA,CULT W/FLUOROCHROME SMEAR
MICRO NUMBER: 90905550
SPECIMEN QUALITY:: ADEQUATE

## 2017-09-25 NOTE — Progress Notes (Addendum)
Pulmonary Individual Treatment Plan  Patient Details  Name: Mia Hernandez MRN: [id number] Date of Birth: 08/07/1940 Referring Provider:     PULMONARY REHAB OTHER RESPIRATORY from 08/13/2017 in [place]  Referring Provider  Dr. Annamaria Boots      Initial Encounter Date:    [facility] from 08/13/2017 in [place]  Date  08/15/17      Visit Diagnosis: Bronchiectasis without complication ([place])  Patient's Home Medications on Admission:  Current Outpatient Medications:  .  azithromycin (ZITHROMAX) 250 MG tablet, Take by mouth daily., Disp: , Rfl:  .  irbesartan (AVAPRO) 150 MG tablet, Take 1 tablet (150 mg total) by mouth daily., Disp: 90 tablet, Rfl: 3  Past Medical History: Past Medical History:  Diagnosis Date  . Acute left eye pain 11/15/2014  . Adverse effect of general anesthetic    DELERIUM  . ALLERGIC RHINITIS   . Anxiety   . Arthritis   . Bronchiectasis ([place])   . Cancer ([place])    basal cell skin cancer removed  . Chronic back pain   . Complication of anesthesia    s/p lung surgery at [place] X 2 MONTHS  . Diverticulosis   . Dysphagia 10/13/2014  . Hydropneumothorax 11/15/2014  . Hypertension   . Hyponatremia 09/15/2014  . Internal hemorrhoids   . Irritable bowel syndrome with constipation   . 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 Osteoporosis   . Stroke ([place])    TIA  SEVERAL YEARS AGO X 1 (72YRS -30 YRS AGO)  . Subcutaneous emphysema ([place]) 10/13/2014  . Toe pain 11/15/2014    Tobacco Use: Social History   Tobacco Use  Smoking Status Never Smoker  Smokeless Tobacco Never Used    Labs: Recent Review Scientist, physiological    Labs for ITP Cardiac and Pulmonary Rehab Latest Ref Rng & Units 03/08/2014 04/26/2016 11/01/2016   Cholestrol 0 - 200 mg/dL  212(H) 185 187   LDLCALC 0 - 99 mg/dL 80 90 96   HDL >39.00 mg/dL 120 79.40 81.00   Trlycerides 0.0 - 149.0 mg/dL 58 77.0 52.0      Capillary Blood Glucose: No results found for: GLUCAP   Exercise Target Goals: Exercise Program Goal: Individual exercise prescription set using results from initial 6 min walk test and THRR while considering  patient's activity barriers and safety.   Exercise Prescription Goal: Initial exercise prescription builds to 30-45 minutes a day of aerobic activity, 2-3 days per week.  Home exercise guidelines will be given to patient during program as part of exercise prescription that the participant will acknowledge.  Activity Barriers & Risk Stratification: Activity Barriers & Cardiac Risk Stratification - 08/09/17 1002      Activity Barriers & Cardiac Risk Stratification   Activity Barriers  Back Problems;Deconditioning;Muscular Weakness;Shortness of Breath    Cardiac Risk Stratification  Low       6 Minute Walk: 6 Minute Walk    Row Name 08/15/17 0721 08/15/17 0748       6 Minute Walk   Phase  Initial  (Pended)   Initial    Distance  1375 feet  (Pended)   1375 feet    Walk Time  6 minutes  (Pended)   6 minutes    # of Rest Breaks  0  (Pended)   0  MPH  2.6  (Pended)   2.6    METS  2.99  (Pended)   2.99    RPE  11  (Pended)   11    Perceived Dyspnea   2  (Pended)   2    Symptoms  No  (Pended)   No    Resting HR  86 bpm  (Pended)   86 bpm    Resting BP  150/80  (Pended)   150/80    Resting Oxygen Saturation   97 %  (Pended)   97 %    Exercise Oxygen Saturation  during 6 min walk  93 %  (Pended)   93 %    Max Ex. HR  107 bpm  (Pended)   107 bpm    Max Ex. BP  160/90  (Pended)   160/90    2 Minute Post BP  166/90  (Pended)   166/90      Interval HR   1 Minute HR  92  (Pended)   92    2 Minute HR  98  (Pended)   98    3 Minute HR  99  (Pended)   99    4 Minute HR  105  (Pended)   105    5 Minute HR  107  (Pended)   107    6 Minute HR   100  (Pended)   100    2 Minute Post HR  94  (Pended)   94    Interval Heart Rate?  Yes  (Pended)   Yes      Interval Oxygen   Interval Oxygen?  Yes  (Pended)   Yes    Baseline Oxygen Saturation %  97 %  (Pended)   97 %    1 Minute Oxygen Saturation %  97 %  (Pended)   97 %    1 Minute Liters of Oxygen  0 L  (Pended)   0 L    2 Minute Oxygen Saturation %  94 %  (Pended)   94 %    2 Minute Liters of Oxygen  0 L  (Pended)   0 L    3 Minute Oxygen Saturation %  93 %  (Pended)   93 %    3 Minute Liters of Oxygen  0 L  (Pended)   0 L    4 Minute Oxygen Saturation %  94 %  (Pended)   94 %    4 Minute Liters of Oxygen  0 L  (Pended)   0 L    5 Minute Oxygen Saturation %  -  95 %    5 Minute Liters of Oxygen  -  0 L    6 Minute Oxygen Saturation %  -  96 %    6 Minute Liters of Oxygen  -  0 L    2 Minute Post Oxygen Saturation %  -  97 %    2 Minute Post Liters of Oxygen  -  0 L       Oxygen Initial Assessment: Oxygen Initial Assessment - 08/15/17 0721      Initial 6 min Walk   Oxygen Used  None      Program Oxygen Prescription   Program Oxygen Prescription  None       Oxygen Re-Evaluation: Oxygen Re-Evaluation    Row Name 08/26/17 1204 09/23/17 1609           Program Oxygen Prescription   Program  Oxygen Prescription  None  None        Home Oxygen   Home Oxygen Device  None  None      Sleep Oxygen Prescription  None  None      Home Exercise Oxygen Prescription  None  None      Home at Rest Exercise Oxygen Prescription  None  None        Goals/Expected Outcomes   Short Term Goals  -  To learn and exhibit compliance with exercise, home and travel O2 prescription;To learn and demonstrate proper use of respiratory medications;To learn and understand importance of maintaining oxygen saturations>88%;To learn and understand importance of monitoring SPO2 with pulse oximeter and demonstrate accurate use of the pulse oximeter.;To learn and demonstrate proper pursed lip breathing  techniques or other breathing techniques.      Long  Term Goals  -  Exhibits compliance with exercise, home and travel O2 prescription;Verbalizes importance of monitoring SPO2 with pulse oximeter and return demonstration;Maintenance of O2 saturations>88%;Exhibits proper breathing techniques, such as pursed lip breathing or other method taught during program session;Compliance with respiratory medication;Demonstrates proper use of MDI's         Oxygen Discharge (Final Oxygen Re-Evaluation): Oxygen Re-Evaluation - 09/23/17 1609      Program Oxygen Prescription   Program Oxygen Prescription  None      Home Oxygen   Home Oxygen Device  None    Sleep Oxygen Prescription  None    Home Exercise Oxygen Prescription  None    Home at Rest Exercise Oxygen Prescription  None      Goals/Expected Outcomes   Short Term Goals  To learn and exhibit compliance with exercise, home and travel O2 prescription;To learn and demonstrate proper use of respiratory medications;To learn and understand importance of maintaining oxygen saturations>88%;To learn and understand importance of monitoring SPO2 with pulse oximeter and demonstrate accurate use of the pulse oximeter.;To learn and demonstrate proper pursed lip breathing techniques or other breathing techniques.    Long  Term Goals  Exhibits compliance with exercise, home and travel O2 prescription;Verbalizes importance of monitoring SPO2 with pulse oximeter and return demonstration;Maintenance of O2 saturations>88%;Exhibits proper breathing techniques, such as pursed lip breathing or other method taught during program session;Compliance with respiratory medication;Demonstrates proper use of MDI's       Initial Exercise Prescription: Initial Exercise Prescription - 08/15/17 0700      Date of Initial Exercise RX and Referring Provider   Date  08/15/17    Referring Provider  Dr. Annamaria Boots      Recumbant Bike   Level  1    Watts  10    Minutes  17       NuStep   Level  2    SPM  80    Minutes  17    METs  1.5      Track   Laps  9    Minutes  17      Prescription Details   Frequency (times per week)  2    Duration  Progress to 45 minutes of aerobic exercise without signs/symptoms of physical distress      Intensity   THRR 40-80% of Max Heartrate  57-114    Ratings of Perceived Exertion  11-13    Perceived Dyspnea  0-4      Progression   Progression  Continue progressive overload as per policy without signs/symptoms or physical distress.      Horticulturist, commercial  Prescription  Yes    Weight  orange bands    Reps  10-15       Perform Capillary Blood Glucose checks as needed.  Exercise Prescription Changes: Exercise Prescription Changes    Row Name 08/27/17 1200 09/10/17 1200 09/24/17 1300         Response to Exercise   Blood Pressure (Admit)  128/58  102/70  130/80     Blood Pressure (Exercise)  158/82  120/70  140/80     Blood Pressure (Exit)  104/72  108/60  118/80     Heart Rate (Admit)  84 bpm  82 bpm  84 bpm     Heart Rate (Exercise)  99 bpm  81 bpm  98 bpm     Heart Rate (Exit)  84 bpm  87 bpm  72 bpm     Oxygen Saturation (Admit)  98 %  99 %  95 %     Oxygen Saturation (Exercise)  92 %  97 %  94 %     Oxygen Saturation (Exit)  97 %  94 %  95 %     Rating of Perceived Exertion (Exercise)  13  13  13      Perceived Dyspnea (Exercise)  1  0  1     Duration  Progress to 45 minutes of aerobic exercise without signs/symptoms of physical distress  Progress to 45 minutes of aerobic exercise without signs/symptoms of physical distress  Progress to 45 minutes of aerobic exercise without signs/symptoms of physical distress     Intensity  THRR unchanged  THRR unchanged  THRR unchanged       Progression   Progression  Continue to progress workloads to maintain intensity without signs/symptoms of physical distress.  Continue to progress workloads to maintain intensity without signs/symptoms of physical distress.   Continue to progress workloads to maintain intensity without signs/symptoms of physical distress.       Resistance Training   Training Prescription  Yes  Yes  Yes     Weight  orange bands  orange bands  orange bands     Reps  10-15  10-15  10-15       Recumbant Bike   Level  1  2  2      Watts  10  10  10      Minutes  17  17  17        NuStep   Level  2  2  3      SPM  80  80  80     Minutes  17  17  17      METs  1.6  1.3  2       Track   Laps  14  15  16      Minutes  17  17  17         Exercise Comments:   Exercise Goals and Review: Exercise Goals    Row Name 08/09/17 1003             Exercise Goals   Increase Physical Activity  Yes       Intervention  Provide advice, education, support and counseling about physical activity/exercise needs.;Develop an individualized exercise prescription for aerobic and resistive training based on initial evaluation findings, risk stratification, comorbidities and participant's personal goals.       Expected Outcomes  Short Term: Attend rehab on a regular basis to increase amount of physical activity.;Long Term: Add in home exercise to make exercise  part of routine and to increase amount of physical activity.;Long Term: Exercising regularly at least 3-5 days a week.       Increase Strength and Stamina  Yes       Intervention  Provide advice, education, support and counseling about physical activity/exercise needs.;Develop an individualized exercise prescription for aerobic and resistive training based on initial evaluation findings, risk stratification, comorbidities and participant's personal goals.       Expected Outcomes  Short Term: Increase workloads from initial exercise prescription for resistance, speed, and METs.;Short Term: Perform resistance training exercises routinely during rehab and add in resistance training at home;Long Term: Improve cardiorespiratory fitness, muscular endurance and strength as measured by increased METs and  functional capacity (6MWT)       Able to understand and use rate of perceived exertion (RPE) scale  Yes       Intervention  Provide education and explanation on how to use RPE scale       Expected Outcomes  Short Term: Able to use RPE daily in rehab to express subjective intensity level;Long Term:  Able to use RPE to guide intensity level when exercising independently       Able to understand and use Dyspnea scale  Yes       Intervention  Provide education and explanation on how to use Dyspnea scale       Expected Outcomes  Short Term: Able to use Dyspnea scale daily in rehab to express subjective sense of shortness of breath during exertion;Long Term: Able to use Dyspnea scale to guide intensity level when exercising independently       Knowledge and understanding of Target Heart Rate Range (THRR)  Yes       Intervention  Provide education and explanation of THRR including how the numbers were predicted and where they are located for reference       Expected Outcomes  Short Term: Able to state/look up THRR;Long Term: Able to use THRR to govern intensity when exercising independently;Short Term: Able to use daily as guideline for intensity in rehab       Understanding of Exercise Prescription  Yes       Intervention  Provide education, explanation, and written materials on patient's individual exercise prescription       Expected Outcomes  Short Term: Able to explain program exercise prescription;Long Term: Able to explain home exercise prescription to exercise independently          Exercise Goals Re-Evaluation : Exercise Goals Re-Evaluation    Row Name 08/26/17 1204 09/23/17 1611           Exercise Goal Re-Evaluation   Exercise Goals Review  Increase Physical Activity;Increase Strength and Stamina;Able to understand and use rate of perceived exertion (RPE) scale;Able to understand and use Dyspnea scale;Knowledge and understanding of Target Heart Rate Range (THRR);Understanding of Exercise  Prescription  Increase Physical Activity;Increase Strength and Stamina;Able to understand and use rate of perceived exertion (RPE) scale;Able to understand and use Dyspnea scale;Knowledge and understanding of Target Heart Rate Range (THRR);Understanding of Exercise Prescription      Comments  The patient has only attended 2 rehab sessions. Will cont. to monitor and progress as able.  The patient is able to walk 15 laps (200 ft each) in 15 minutes. Encouraged patient to work at a level of medium to somewhat hard. Will talk about home exerrcise with patient and husband since patient can be at times forgetful.       Expected Outcomes  Through exercise at rehab and at home, the patient will increase physical capacity making ADL's easier to perform. They will also become comfortable with developing and carrying out an exercise regime at home.   Through exercise at rehab and at home, the patient will increase physical capacity making ADL's easier to perform. They will also become comfortable with developing and carrying out an exercise regime at home.          Discharge Exercise Prescription (Final Exercise Prescription Changes): Exercise Prescription Changes - 09/24/17 1300      Response to Exercise   Blood Pressure (Admit)  130/80    Blood Pressure (Exercise)  140/80    Blood Pressure (Exit)  118/80    Heart Rate (Admit)  84 bpm    Heart Rate (Exercise)  98 bpm    Heart Rate (Exit)  72 bpm    Oxygen Saturation (Admit)  95 %    Oxygen Saturation (Exercise)  94 %    Oxygen Saturation (Exit)  95 %    Rating of Perceived Exertion (Exercise)  13    Perceived Dyspnea (Exercise)  1    Duration  Progress to 45 minutes of aerobic exercise without signs/symptoms of physical distress    Intensity  THRR unchanged      Progression   Progression  Continue to progress workloads to maintain intensity without signs/symptoms of physical distress.      Resistance Training   Training Prescription  Yes    Weight   orange bands    Reps  10-15      Recumbant Bike   Level  2    Watts  10    Minutes  17      NuStep   Level  3    SPM  80    Minutes  17    METs  2      Track   Laps  16    Minutes  17       Nutrition:  Target Goals: Understanding of nutrition guidelines, daily intake of sodium 1500mg , cholesterol 200mg , calories 30% from fat and 7% or less from saturated fats, daily to have 5 or more servings of fruits and vegetables.  Biometrics:    Nutrition Therapy Plan and Nutrition Goals: Nutrition Therapy & Goals - 08/13/17 1348      Nutrition Therapy   Diet  high proteinm high calorie      Personal Nutrition Goals   Nutrition Goal  Pt to identify and increase food sources high in protein and calories    Personal Goal #2  The pt will consume high-energy, high-nutrient dense beverages when necessary to compensate for decreased oral intake of solid foods.    Personal Goal #3  Identify food quantities necessary to achieve wt maintenance or gain of  -2# per week to a goal wt gain of 2.7-10.9 kg (6-24 lb) at graduation from pulmonary rehab.      Intervention Plan   Intervention  Prescribe, educate and counsel regarding individualized specific dietary modifications aiming towards targeted core components such as weight, hypertension, lipid management, diabetes, heart failure and other comorbidities.    Expected Outcomes  Short Term Goal: Understand basic principles of dietary content, such as calories, fat, sodium, cholesterol and nutrients.       Nutrition Assessments: Nutrition Assessments - 08/13/17 1349      Rate Your Plate Scores   Pre Score  35       Nutrition Goals Re-Evaluation: Nutrition Goals Re-Evaluation  [place] Name 08/13/17 1348             Goals   Current Weight  95 lb (43.1 kg)          Nutrition Goals Re-Evaluation: Nutrition Goals Re-Evaluation    [place] Name 08/13/17 1348             Goals   Current Weight  95 lb (43.1 kg)           Nutrition Goals Discharge (Final Nutrition Goals Re-Evaluation): Nutrition Goals Re-Evaluation - 08/13/17 1348      Goals   Current Weight  95 lb (43.1 kg)       Psychosocial: Target Goals: Acknowledge presence or absence of significant depression and/or stress, maximize coping skills, provide positive support system. Participant is able to verbalize types and ability to use techniques and skills needed for reducing stress and depression.  Initial Review & Psychosocial Screening: Initial Psych Review & Screening - 08/09/17 0955      Initial Review   Current issues with  Current Stress Concerns    Source of Stress Concerns  Family;Unable to participate in former interests or hobbies;Unable to perform yard/household activities   husband recent diagnosis of colon CA under treatment at [facility]  pt dealing with  husband well with healthy coping [place]?  Yes    Comments  pt married 55 years.  Pt supportive of each other.      Barriers   Psychosocial barriers to participate in program  The patient should benefit from training in stress management and relaxation.       Quality of Life Scores:  Scores of 19 and below usually indicate a poorer quality of life in these areas.  A difference of  2-3 points is a clinically meaningful difference.  A difference of 2-3 points in the total score of the Quality of Life Index has been associated with significant improvement in overall quality of life, self-image, physical symptoms, and general health in studies assessing change in quality of life.  PHQ-9: Recent Review Flowsheet Data    Depression screen [facility] 2/9 08/09/2017 06/05/2017 04/01/2017 03/18/2017 01/30/2017   Decreased Interest 0 0 0 0 0   Down, Depressed, Hopeless 0 0 0 0 0   PHQ - 2 Score 0 0 0 0 0   Altered sleeping 0 - - - -   Tired, decreased energy 1 - - - -   Change in appetite 0 - - - -   Feeling bad or failure about  yourself  0 - - - -   Trouble concentrating 0 - - - -   Moving slowly or fidgety/restless 0 - - - -   Suicidal thoughts 0 - - - -   PHQ-9 Score 1 - - - -   Difficult doing work/chores Not difficult at all - - - -     Interpretation of Total Score  Total Score Depression Severity:  1-4 = Minimal depression, 5-9 = Mild depression, 10-14 = Moderate depression, 15-19 = Moderately severe depression, 20-27 = Severe depression   Psychosocial Evaluation and Intervention: Psychosocial Evaluation - 09/25/17 1531      Psychosocial Evaluation & Interventions   Interventions  Encouraged to exercise with the program and follow exercise prescription;Relaxation education;Stress management education    Comments  no identifiable barriers     Expected Outcomes  Pt will continue to display positive healthy  skills and realistic outlook on future    Continue Psychosocial Services   Follow up required by staff       Psychosocial Re-Evaluation: Psychosocial Re-Evaluation    [place] Name 08/27/17 1623 08/27/17 1625 09/25/17 1531         Psychosocial Re-Evaluation   Current issues with  None Identified  Current Stress Concerns  Current Stress Concerns     Comments  no identifiable barriers  Pt husband is undergoing chemotherapy for recurrent cancer  Pt husband is undergoing chemotherapy for recurrent cancer doing well with his treatments.  Pt husband accompanies her to rehab     Expected Outcomes  Pt will continue to display heatlhy coping skills and positive outlook on life  Pt will display heatlhy coping skills and positive outlook on life  Pt will display heatlhy coping skills and positive outlook on life     Interventions  Stress management education;Relaxation education;Encouraged to attend Pulmonary Rehabilitation for the exercise  -  -     Continue Psychosocial Services   Follow up required by staff  -  Follow up required by staff        Psychosocial Discharge (Final Psychosocial  Re-Evaluation): Psychosocial Re-Evaluation - 09/25/17 1531      Psychosocial Re-Evaluation   Current issues with  Current Stress Concerns    Comments  Pt husband is undergoing chemotherapy for recurrent cancer doing well with his treatments.  Pt husband accompanies her to rehab    Expected Outcomes  Pt will display heatlhy coping skills and positive outlook on life    Continue Psychosocial Services   Follow up required by staff       Vocational Rehabilitation: Provide vocational rehab assistance to qualifying candidates.   Vocational Rehab Evaluation & Intervention: Vocational Rehab - 08/09/17 1000      Initial Vocational Rehab Evaluation & Intervention   Assessment shows need for Vocational Rehabilitation  No   retired from teaching school      Education: Education Goals: Education classes will be provided on a weekly basis, covering required topics. Participant will state understanding/return demonstration of topics presented.  Learning Barriers/Preferences: Learning Barriers/Preferences - 08/09/17 0959      Learning Barriers/Preferences   Learning Barriers  Hearing;Sight    Learning Preferences  Written Material;Pictoral;Computer/Internet       Education Topics: Count Your Pulse:  -Group instruction provided by verbal instruction, demonstration, patient participation and written materials to support subject.  Instructors address importance of being able to find your pulse and how to count your pulse when at home without a heart monitor.  Patients get hands on experience counting their pulse with staff help and individually.   Heart Attack, Angina, and Risk Factor Modification:  -Group instruction provided by verbal instruction, video, and written materials to support subject.  Instructors address signs and symptoms of angina and heart attacks.    Also discuss risk factors for heart disease and how to make changes to improve heart health risk factors.   Functional  Fitness:  -Group instruction provided by verbal instruction, demonstration, patient participation, and written materials to support subject.  Instructors address safety measures for doing things around the house.  Discuss how to get up and down off the floor, how to pick things up properly, how to safely get out of a chair without assistance, and balance training.   Meditation and Mindfulness:  -Group instruction provided by verbal instruction, patient participation, and written materials to support subject.  Instructor addresses importance of mindfulness  and meditation practice to help reduce stress and improve awareness.  Instructor also leads participants through a meditation exercise.    PULMONARY REHAB OTHER RESPIRATORY from 09/17/2017 in [place]  Date  08/22/17  Educator  bh  Instruction Review Code  2- Demonstrated Understanding      Stretching for Flexibility and Mobility:  -Group instruction provided by verbal instruction, patient participation, and written materials to support subject.  Instructors lead participants through series of stretches that are designed to increase flexibility thus improving mobility.  These stretches are additional exercise for major muscle groups that are typically performed during regular warm up and cool down.   Hands Only CPR:  -Group verbal, video, and participation provides a basic overview of AHA guidelines for community CPR. Role-play of emergencies allow participants the opportunity to practice calling for help and chest compression technique with discussion of AED use.   Hypertension: -Group verbal and written instruction that provides a basic overview of hypertension including the most recent diagnostic guidelines, risk factor reduction with self-care instructions and medication management.    Nutrition I class: Heart Healthy Eating:  -Group instruction provided by PowerPoint slides, verbal discussion, and  written materials to support subject matter. The instructor gives an explanation and review of the Therapeutic Lifestyle Changes diet recommendations, which includes a discussion on lipid goals, dietary fat, sodium, fiber, plant stanol/sterol esters, sugar, and the components of a well-balanced, healthy diet.   Nutrition II class: Lifestyle Skills:  -Group instruction provided by PowerPoint slides, verbal discussion, and written materials to support subject matter. The instructor gives an explanation and review of label reading, grocery shopping for heart health, heart healthy recipe modifications, and ways to make healthier choices when eating out.   Diabetes Question & Answer:  -Group instruction provided by PowerPoint slides, verbal discussion, and written materials to support subject matter. The instructor gives an explanation and review of diabetes co-morbidities, pre- and post-prandial blood glucose goals, pre-exercise blood glucose goals, signs, symptoms, and treatment of hypoglycemia and hyperglycemia, and foot care basics.   Diabetes Blitz:  -Group instruction provided by PowerPoint slides, verbal discussion, and written materials to support subject matter. The instructor gives an explanation and review of the physiology behind type 1 and type 2 diabetes, diabetes medications and rational behind using different medications, pre- and post-prandial blood glucose recommendations and Hemoglobin A1c goals, diabetes diet, and exercise including blood glucose guidelines for exercising safely.    Portion Distortion:  -Group instruction provided by PowerPoint slides, verbal discussion, written materials, and food models to support subject matter. The instructor gives an explanation of serving size versus portion size, changes in portions sizes over the last 20 years, and what consists of a serving from each food group.   Stress Management:  -Group instruction provided by verbal instruction,  video, and written materials to support subject matter.  Instructors review role of stress in heart disease and how to cope with stress positively.     Exercising on Your Own:  -Group instruction provided by verbal instruction, power point, and written materials to support subject.  Instructors discuss benefits of exercise, components of exercise, frequency and intensity of exercise, and end points for exercise.  Also discuss use of nitroglycerin and activating EMS.  Review options of places to exercise outside of rehab.  Review guidelines for sex with heart disease.   Cardiac Drugs I:  -Group instruction provided by verbal instruction and written materials to support subject.  Instructor reviews cardiac  drug classes: antiplatelets, anticoagulants, beta blockers, and statins.  Instructor discusses reasons, side effects, and lifestyle considerations for each drug class.   Cardiac Drugs II:  -Group instruction provided by verbal instruction and written materials to support subject.  Instructor reviews cardiac drug classes: angiotensin converting enzyme inhibitors (ACE-I), angiotensin II receptor blockers (ARBs), nitrates, and calcium channel blockers.  Instructor discusses reasons, side effects, and lifestyle considerations for each drug class.   Anatomy and Physiology of the Circulatory System:  Group verbal and written instruction and models provide basic cardiac anatomy and physiology, with the coronary electrical and arterial systems. Review of: AMI, Angina, Valve disease, Heart Failure, Peripheral Artery Disease, Cardiac Arrhythmia, Pacemakers, and the ICD.   Other Education:  -Group or individual verbal, written, or video instructions that support the educational goals of the cardiac rehab program.   Holiday Eating Survival Tips:  -Group instruction provided by PowerPoint slides, verbal discussion, and written materials to support subject matter. The instructor gives patients tips,  tricks, and techniques to help them not only survive but enjoy the holidays despite the onslaught of food that accompanies the holidays.   Knowledge Questionnaire Score: Knowledge Questionnaire Score - 08/14/17 1419      Knowledge Questionnaire Score   Pre Score  14/18       Core Components/Risk Factors/Patient Goals at Admission: Personal Goals and Risk Factors at Admission - 08/09/17 0952      Core Components/Risk Factors/Patient Goals on Admission    Weight Management  Yes;Weight Gain    Intervention  Obesity: Provide education and appropriate resources to help participant work on and attain dietary goals.;Weight Management: Provide education and appropriate resources to help participant work on and attain dietary goals.;Weight Management: Develop a combined nutrition and exercise program designed to reach desired caloric intake, while maintaining appropriate intake of nutrient and fiber, sodium and fats, and appropriate energy expenditure required for the weight goal.    Admit Weight  93 lb 0.6 oz (42.2 kg)    Goal Weight: Short Term  96 lb 8 oz (43.8 kg)    Goal Weight: Long Term  100 lb (45.4 kg)    Expected Outcomes  Short Term: Continue to assess and modify interventions until short term weight is achieved;Long Term: Adherence to nutrition and physical activity/exercise program aimed toward attainment of established weight goal;Understanding of distribution of calorie intake throughout the day with the consumption of 4-5 meals/snacks;Weight Gain: Understanding of general recommendations for a high calorie, high protein meal plan that promotes weight gain by distributing calorie intake throughout the day with the consumption for 4-5 meals, snacks, and/or supplements    Improve shortness of breath with ADL's  Yes    Intervention  Provide education, individualized exercise plan and daily activity instruction to help decrease symptoms of SOB with activities of daily living.    Expected  Outcomes  Short Term: Improve cardiorespiratory fitness to achieve a reduction of symptoms when performing ADLs;Long Term: Be able to perform more ADLs without symptoms or delay the onset of symptoms    Stress  Yes    Intervention  Offer individual and/or small group education and counseling on adjustment to heart disease, stress management and health-related lifestyle change. Teach and support self-help strategies.    Expected Outcomes  Short Term: Participant demonstrates changes in health-related behavior, relaxation and other stress management skills, ability to obtain effective social support, and compliance with psychotropic medications if prescribed.;Long Term: Emotional wellbeing is indicated by absence of clinically significant psychosocial distress  or social isolation.       Core Components/Risk Factors/Patient Goals Review:  Goals and Risk Factor Review    Row Name 08/27/17 1626 08/28/17 0929 09/25/17 1531         Core Components/Risk Factors/Patient Goals Review   Personal Goals Review  Weight Management/Obesity;Improve shortness of breath with ADL's;Increase knowledge of respiratory medications and ability to use respiratory devices properly.;Develop more efficient breathing techniques such as purse lipped breathing and diaphragmatic breathing and practicing self-pacing with activity.;Stress  -  Weight Management/Obesity;Improve shortness of breath with ADL's;Increase knowledge of respiratory medications and ability to use respiratory devices properly.;Develop more efficient breathing techniques such as purse lipped breathing and diaphragmatic breathing and practicing self-pacing with activity.;Stress     Review  I do anticipate in the next 30 days pt will make progress toward goals.  -  Pt has completed 11 exercise sessions. Pt has gained .4 kg since initial session toward her goal to gain weight. Pt has attended respiratory medication class. Pt uses PLB breathing effectively but is  unsure if this is really helping her.  Pt rates a level 0-1 for dypnea. Pt husband is coping well with his cancer treatments and is able to accompany pt to class.  Pt able to ambulate 16 laps around the track in 15 minutes, inrease nustep to level 3 and recombent bike remains level 2 - thisis pt least favorite exercie!Marland Kitchen  continue to montior pt progress during the next 30 days.     Expected Outcomes  -  See Admission Goals/Outcomes  See Admission Goals/Outcomes        Core Components/Risk Factors/Patient Goals at Discharge (Final Review):  Goals and Risk Factor Review - 09/25/17 1531      Core Components/Risk Factors/Patient Goals Review   Personal Goals Review  Weight Management/Obesity;Improve shortness of breath with ADL's;Increase knowledge of respiratory medications and ability to use respiratory devices properly.;Develop more efficient breathing techniques such as purse lipped breathing and diaphragmatic breathing and practicing self-pacing with activity.;Stress    Review  Pt has completed 11 exercise sessions. Pt has gained .4 kg since initial session toward her goal to gain weight. Pt has attended respiratory medication class. Pt uses PLB breathing effectively but is unsure if this is really helping her.  Pt rates a level 0-1 for dypnea. Pt husband is coping well with his cancer treatments and is able to accompany pt to class.  Pt able to ambulate 16 laps around the track in 15 minutes, inrease nustep to level 3 and recombent bike remains level 2 - thisis pt least favorite exercie!Marland Kitchen  continue to montior pt progress during the next 30 days.    Expected Outcomes  See Admission Goals/Outcomes       ITP Comments: ITP Comments    Row Name 08/09/17 4035 08/28/17 0928 09/25/17 1530       ITP Comments  Dr Jennet Maduro, Medical Director  Dr Jennet Maduro, Medical Director  Dr Jennet Maduro, Medical Director Pulmonary Rehab        Comments: Pt has completed 11 exercise sessions. Cherre Huger, BSN Cardiac and Training and development officer

## 2017-09-26 ENCOUNTER — Encounter: Payer: Self-pay | Admitting: Pharmacist

## 2017-09-26 ENCOUNTER — Encounter (HOSPITAL_COMMUNITY)
Admission: RE | Admit: 2017-09-26 | Discharge: 2017-09-26 | Disposition: A | Discharge status: Home / Self Care | Payer: Medicare Other | Source: Ambulatory Visit | Attending: Internal Medicine | Admitting: Internal Medicine

## 2017-09-26 ENCOUNTER — Ambulatory Visit: Payer: Medicare Other | Admitting: Infectious Disease

## 2017-09-26 DIAGNOSIS — Z85828 Personal history of other malignant neoplasm of skin: Secondary | ICD-10-CM | POA: Diagnosis not present

## 2017-09-26 DIAGNOSIS — I10 Essential (primary) hypertension: Secondary | ICD-10-CM | POA: Diagnosis not present

## 2017-09-26 DIAGNOSIS — Z79899 Other long term (current) drug therapy: Secondary | ICD-10-CM | POA: Diagnosis not present

## 2017-09-26 DIAGNOSIS — J479 Bronchiectasis, uncomplicated: Secondary | ICD-10-CM | POA: Diagnosis not present

## 2017-09-26 DIAGNOSIS — M1990 Unspecified osteoarthritis, unspecified site: Secondary | ICD-10-CM | POA: Diagnosis not present

## 2017-09-26 DIAGNOSIS — F419 Anxiety disorder, unspecified: Secondary | ICD-10-CM | POA: Diagnosis not present

## 2017-09-26 NOTE — Progress Notes (Signed)
Daily Session Note  Patient Details  Name: Mia Hernandez MRN: [id number] Date of Birth: [birth year] Referring Provider:     PULMONARY REHAB OTHER RESPIRATORY from 08/13/2017 in [place]  Referring Provider  Dr. Annamaria Boots      Encounter Date: 09/26/2017  Check In: Session Check In - 09/26/17 1228      Check-In   Supervising physician immediately available to respond to emergencies  [facility] immediately available    Physician(s)  Dr. Toma Copier    Location  [facility]    Staff Present  Maurice Small, RN, BSN;Molly DiVincenzo, MS, ACSM RCEP, Exercise Physiologist;Johntavius Shepard Ysidro Evert, Felipe Drone, RN, MHA    Medication changes reported      No    Fall or balance concerns reported     No    Tobacco Cessation  No Change    Warm-up and Cool-down  Performed as group-led instruction    Resistance Training Performed  Yes    VAD Patient?  No    PAD/SET Patient?  No      Pain Assessment   Currently in Pain?  No/denies    Multiple Pain Sites  No       Capillary Blood Glucose: No results found for this or any previous visit (from the past 24 hour(s)).    Social History   Tobacco Use  Smoking Status Never Smoker  Smokeless Tobacco Never Used    Goals Met:  Exercise tolerated well No report of cardiac concerns or symptoms Strength training completed today  Goals Unmet:  Not Applicable  Comments: Service time is from 1030 to 1220. Attended the Sedentary Lifestyle class.    Dr. Rush Farmer is Medical Director for Pulmonary Rehab at [facility].

## 2017-09-27 ENCOUNTER — Encounter: Payer: Self-pay | Admitting: Family

## 2017-09-27 ENCOUNTER — Telehealth: Payer: Self-pay | Admitting: Internal Medicine

## 2017-09-27 ENCOUNTER — Ambulatory Visit (INDEPENDENT_AMBULATORY_CARE_PROVIDER_SITE_OTHER): Payer: Medicare Other | Admitting: Family

## 2017-09-27 VITALS — BP 183/102 | HR 93 | Temp 97.6°F | Wt 94.8 lb

## 2017-09-27 DIAGNOSIS — A310 Pulmonary mycobacterial infection: Secondary | ICD-10-CM | POA: Diagnosis not present

## 2017-09-27 NOTE — Patient Instructions (Addendum)
Nice to meet you.  I recommend stopping the azithromycin as by itself it has significant chance of developing resistance.  Plan for follow up with Dr. Tommy Medal in 2 months or sooner if needed.  Mycobacterium Avium Complex Mycobacterium avium complex (MAC) is an infection caused by two similar and very common germs. Most people who are exposed to these germs do not get infected. You may develop a MAC infection if your body's defense system (immune system) is weak from another disease, especially AIDS. Children and adults with lung disease may also get MAC even with a normal immune system. MAC causes two types of infection. If you have a normal immune system, you may get only a limited (local) infection. Local MAC infection in children usually causes swollen glands in the neck (cervical lymph nodes). Local infection in adults usually causes pneumonia. If your immune system is weak, you may get a form of MAC that is widespread (disseminated) throughout the body. This is more serious. What are the causes? MAC is caused by two germs: Mycobacterium avium and Mycobacterium intracellulare. These germs are often found in drinking water, hot tubs, swimming pools, house dust, and animals. You can become infected if you breathe in water particles or dust, or if you eat or drink anything contaminated with these germs. MAC can grow in pasteurized milk, and children may become infected from drinking milk. MAC does not spread from person to person. What increases the risk? The most common risk factor for disseminated MAC infection is having AIDS. Being a child is a risk factor for local MAC infection of the cervical lymph nodes. Risk factors for MAC pneumonia include:  Smoking.  Having tuberculosis.  Having chronic obstructive lung disease (COPD).  Having lung cancer.  Having cystic fibrosis.  Having other long-standing (chronic) lung diseases.  What are the signs or symptoms? Signs and symptoms vary  depending on the type of MAC infection.  The signs of MAC infection of the cervical lymph nodes include enlarged lymph nodes on one side of the neck, under the jaw, or around the ear. The lymph nodes will feel enlarged and rubbery.  MAC pneumonia may cause fatigue, shortness of breath, and a lasting cough that produces lots of mucus. Your health care provider may hear abnormal sounds when he or she listens to your lungs.  Disseminated MAC can spread through the body and cause many symptoms. The most common are: ? Fever. ? Night sweats. ? Weight loss. ? Loss of appetite. ? Fatigue.  How is this diagnosed? Your health care provider may suspect MAC infection from your symptoms, physical exam, and risk factors. Tests may be done to help confirm the diagnosis. These may include:  Chest X-ray or CT scan to look for MAC pneumonia.  Taking samples of sputum, blood, or other body fluids.  Taking a piece of body tissue for a sample (biopsy).  When samples are taken for diagnosis, they are checked to see if MAC will grow (cultured). It may take about 2 weeks for MAC to grow and be identified under a microscope. How is this treated? MAC is difficult to treat because it does not respond to many common antibiotic medicines. Treatment depends on the type of infection:  MAC infection of the cervical lymph nodes can be treated by surgically removing the infected lymph nodes. After the lymph nodes are removed, antibiotics are usually not needed.  MAC pneumonia may be treated with two or more antibiotics. You may have to take these  until cultures have not grown MAC for at least 1 year.  Disseminated MAC infection may be treated with three or four antibiotics that you have to take for at least 1 year. In some cases, treatment may continue for several years.  Follow these instructions at home: Take all antibiotic medicine as directed by your health care provider. Contact a health care provider  if:  You have chills or a fever.  You have a persistent cough.  You have loss of appetite or weight loss.  You feel very tired. Get help right away if:  You have a fever for more than 2-3 days.  You have trouble breathing.  You have chest pain. This information is not intended to replace advice given to you by your health care provider. Make sure you discuss any questions you have with your health care provider. Document Released: 12/30/2012 Document Revised: 06/02/2015 Document Reviewed: 11/18/2012 Elsevier Interactive Patient Education  [employer].

## 2017-09-27 NOTE — Progress Notes (Signed)
Subjective:    Patient ID: Mia Hernandez, female    DOB: February 16, 1940, 77 y.o.   MRN: [id number]  Chief Complaint  Patient presents with  . Mycobacterium Infection    HPI:  Mia Hernandez is a 77 y.o. female who presents today for follow up of her mycobacterium avium infection.   Mia Hernandez was last seen in the office on 06/05/17 for follow up of her MAC infection s/p lobectomy and was continued on her regimen of azithromycin, clofazimine and inhaled amikacin. She was also going to be starting pulmonary rehabilitation.  Mia Hernandez has not been taking the inhaled amikacin or the clofazimine secondary to the side effects of decreased hearing, having a raw throat, and spitting up blood on occasion. She stopped these medications about 1 month ago and continues to take the Azithromycin on a nightly basis with no adverse side effects. She felt like these medications were all together making her weak and not really seeing any improvement in her symptoms with them. She has attended pulmonary rehabilitation recently and has noted some improvement in her symptoms, however continues to have coughing fits at times. They have also tried to use percussion however believes she may have fractured a rib with this. Continues to attend pulmonary rehabilitation. No fevers, chills, or sweats.    Allergies  Allergen Reactions  . Ethambutol Palpitations    Possible optic neuritis, possible gout attack  . Latex Rash  . Penicillins Hives    Breaks out in hives  . Prednisone Other (See Comments)     "out of mind" state  . Rifabutin Other (See Comments)    Loss of vision and anorexia  . Aspirin   . Betadine [Povidone Iodine]   . Codeine   . Doxycycline     faints  . Iodinated Diagnostic Agents   . 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 Morphine And Related   . Sulfa Antibiotics Other (See Comments)  . Azithromycin Other (See Comments)    Heart rhythm issues-prolonged QT wave       Outpatient Medications Prior to Visit  Medication Sig Dispense Refill  . irbesartan (AVAPRO) 150 MG tablet Take 1 tablet (150 mg total) by mouth daily. 90 tablet 3  . azithromycin (ZITHROMAX) 250 MG tablet Take by mouth daily.     No facility-administered medications prior to visit.      Past Medical History:  Diagnosis Date  . Acute left eye pain 11/15/2014  . Adverse effect of general anesthetic    DELERIUM  . ALLERGIC RHINITIS   . Anxiety   . Arthritis   . Bronchiectasis ([place])   . Cancer ([place])    basal cell skin cancer removed  . Chronic back pain   . Complication of anesthesia    s/p lung surgery at [place] X 2 MONTHS  . Diverticulosis   . Dysphagia 10/13/2014  . Hydropneumothorax 11/15/2014  . Hypertension   . Hyponatremia 09/15/2014  . Internal hemorrhoids   . Irritable bowel syndrome with constipation   . 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 Osteoporosis   . Stroke ([place])    TIA  SEVERAL YEARS AGO X 1 (29YRS -30 YRS AGO)  . Subcutaneous emphysema ([place]) 10/13/2014  . Toe pain 11/15/2014     Past Surgical History:  Procedure Laterality Date  . BREAST BIOPSY    .  BREAST LUMPECTOMY WITH RADIOACTIVE SEED LOCALIZATION Left 10/05/2015   Procedure: LEFT BREAST LUMPECTOMY WITH RADIOACTIVE SEED LOCALIZATION;  Surgeon: Excell Seltzer, MD;  Location: [facility];  Service: General;  Laterality: Left;  . CATARACT EXTRACTION W/ INTRAOCULAR LENS  IMPLANT, BILATERAL    . CHOLECYSTECTOMY    . LAMINECTOMY     cervical   . VAGINAL HYSTERECTOMY       Review of Systems  Constitutional: Positive for fatigue. Negative for chills and fever.  HENT: Positive for congestion. Negative for sinus pressure, sinus pain and sore throat.   Respiratory: Positive for cough. Negative for chest tightness, shortness of breath, wheezing and stridor.    Cardiovascular: Negative for chest pain and leg swelling.  Gastrointestinal: Negative for abdominal pain, constipation, diarrhea, nausea and vomiting.  Neurological: Negative for dizziness, light-headedness and headaches.      Objective:    BP (!) 183/102   Pulse 93   Temp 97.6 F (36.4 C)   Wt 94 lb 12.8 oz (43 kg)   BMI 18.83 kg/m  Nursing note and vital signs reviewed.  Physical Exam  Constitutional: She is oriented to person, place, and time. She appears well-developed. No distress.  Cardiovascular: Normal rate, regular rhythm, normal heart sounds and intact distal pulses.  No murmur heard. Pulmonary/Chest: Effort normal and breath sounds normal. She has no wheezes. She has no rales.  Abdominal: Soft. Bowel sounds are normal. She exhibits no distension and no mass. There is no tenderness.  Neurological: She is alert and oriented to person, place, and time.  Skin: Skin is warm and dry.  Psychiatric: She has a normal mood and affect.       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 She continues to have coughing fits but has seen some improvement. Unfortunately she has stopped taking the clofazimine and inhaled amikacin secondary to side effects. It may have been okay with a 2 drug regimen, however Azithromycin as monotherapy will likely lead to resistance in the near future if not already present. I think at the present time it is in her best interest to stop the Azithromycin and continue with pulmonary rehabilitation. This is a multidrug resistant organism and there are no good treatment options remaining that she is able to tolerate. Continue to monitor off antibiotics at this time and follow up with Dr. Tommy Medal in 2 months or sooner if needed.           I have  discontinued Mia Hernandez's azithromycin. I am also having her maintain her irbesartan.   Follow-up: Return in about 2 months (around 11/27/2017), or if symptoms worsen or fail to improve.   Terri Piedra, MSN, FNP-C Nurse Practitioner [facility] Office phone: [phone] Pager: [place] number: [phone]

## 2017-09-27 NOTE — Telephone Encounter (Signed)
Result Notes for MYCOBACTERIA, CULTURE, WITH FLUOROCHROME SMEAR   Notes recorded by Valerie Salts, CMA on 09/27/2017 at 10:06 AM EDT Left message for patient to call back for results. ------  Notes recorded by Deneise Lever, MD on 09/27/2017 at 10:03 AM EDT Sputum cultures from July are still positive for Mycobacterium avium. Suggest she keep doing what she can to keep her airways clear.   Pt aware of results and no further questions or concerns at this time.

## 2017-09-27 NOTE — Assessment & Plan Note (Signed)
Mia Hernandez has continued Mycobacterium Avian infection with a positive sputum culture at the end of July. She continues to have coughing fits but has seen some improvement. Unfortunately she has stopped taking the clofazimine and inhaled amikacin secondary to side effects. It may have been okay with a 2 drug regimen, however Azithromycin as monotherapy will likely lead to resistance in the near future if not already present. I think at the present time it is in her best interest to stop the Azithromycin and continue with pulmonary rehabilitation. This is a multidrug resistant organism and there are no good treatment options remaining that she is able to tolerate. Continue to monitor off antibiotics at this time and follow up with Dr. Tommy Medal in 2 months or sooner if needed.

## 2017-10-01 ENCOUNTER — Encounter (HOSPITAL_COMMUNITY)
Admission: RE | Admit: 2017-10-01 | Discharge: 2017-10-01 | Disposition: A | Discharge status: Home / Self Care | Payer: Medicare Other | Source: Ambulatory Visit | Attending: Internal Medicine | Admitting: Internal Medicine

## 2017-10-01 DIAGNOSIS — J479 Bronchiectasis, uncomplicated: Secondary | ICD-10-CM | POA: Diagnosis not present

## 2017-10-01 DIAGNOSIS — Z79899 Other long term (current) drug therapy: Secondary | ICD-10-CM | POA: Diagnosis not present

## 2017-10-01 DIAGNOSIS — I10 Essential (primary) hypertension: Secondary | ICD-10-CM | POA: Diagnosis not present

## 2017-10-01 DIAGNOSIS — F419 Anxiety disorder, unspecified: Secondary | ICD-10-CM | POA: Diagnosis not present

## 2017-10-01 DIAGNOSIS — M1990 Unspecified osteoarthritis, unspecified site: Secondary | ICD-10-CM | POA: Diagnosis not present

## 2017-10-01 DIAGNOSIS — Z85828 Personal history of other malignant neoplasm of skin: Secondary | ICD-10-CM | POA: Diagnosis not present

## 2017-10-01 NOTE — Progress Notes (Signed)
Daily Session Note  Patient Details  Name: Mia Hernandez MRN: [id number] Date of Birth: 1940-02-18 Referring Provider:     PULMONARY REHAB OTHER RESPIRATORY from 08/13/2017 in [place]  Referring Provider  Dr. Annamaria Boots      Encounter Date: 10/01/2017  Check In: Session Check In - 10/01/17 1020      Check-In   Supervising physician immediately available to respond to emergencies  [facility] immediately available    Physician(s)  Dr. Loleta Books    Location  [facility]    Staff Present  Maurice Small, RN, BSN;Evanthia Maund, MS, ACSM RCEP, Exercise Physiologist;Annedrea Stackhouse, RN, [place]    Medication changes reported      No    Fall or balance concerns reported     No    Tobacco Cessation  No Change    Warm-up and Cool-down  Performed as group-led instruction    Resistance Training Performed  Yes    VAD Patient?  No    PAD/SET Patient?  No      Pain Assessment   Currently in Pain?  No/denies    Multiple Pain Sites  No       Capillary Blood Glucose: No results found for this or any previous visit (from the past 24 hour(s)).    Social History   Tobacco Use  Smoking Status Never Smoker  Smokeless Tobacco Never Used    Goals Met:  Exercise tolerated well  Goals Unmet:  Not Applicable  Comments: Service time is from 10:30a to 12:30p    Dr. Rush Farmer is Medical Director for Pulmonary Rehab at [facility].

## 2017-10-03 ENCOUNTER — Ambulatory Visit: Payer: Medicare Other

## 2017-10-03 ENCOUNTER — Encounter (HOSPITAL_COMMUNITY)
Admission: RE | Admit: 2017-10-03 | Discharge: 2017-10-03 | Disposition: A | Discharge status: Home / Self Care | Payer: Medicare Other | Source: Ambulatory Visit | Attending: Internal Medicine | Admitting: Internal Medicine

## 2017-10-03 ENCOUNTER — Ambulatory Visit (INDEPENDENT_AMBULATORY_CARE_PROVIDER_SITE_OTHER): Payer: Medicare Other

## 2017-10-03 DIAGNOSIS — F419 Anxiety disorder, unspecified: Secondary | ICD-10-CM | POA: Diagnosis not present

## 2017-10-03 DIAGNOSIS — J479 Bronchiectasis, uncomplicated: Secondary | ICD-10-CM | POA: Diagnosis not present

## 2017-10-03 DIAGNOSIS — Z23 Encounter for immunization: Secondary | ICD-10-CM

## 2017-10-03 DIAGNOSIS — Z79899 Other long term (current) drug therapy: Secondary | ICD-10-CM | POA: Diagnosis not present

## 2017-10-03 DIAGNOSIS — I10 Essential (primary) hypertension: Secondary | ICD-10-CM | POA: Diagnosis not present

## 2017-10-03 DIAGNOSIS — Z85828 Personal history of other malignant neoplasm of skin: Secondary | ICD-10-CM | POA: Diagnosis not present

## 2017-10-03 DIAGNOSIS — M1990 Unspecified osteoarthritis, unspecified site: Secondary | ICD-10-CM | POA: Diagnosis not present

## 2017-10-03 NOTE — Progress Notes (Signed)
Daily Session Note  Patient Details  Name: Mia Hernandez MRN: [id number] Date of Birth: 06/19/1940 Referring Provider:     PULMONARY REHAB OTHER RESPIRATORY from 08/13/2017 in [place]  Referring Provider  Dr. Annamaria Boots      Encounter Date: 10/03/2017  Check In: Session Check In - 10/03/17 1012      Check-In   Supervising physician immediately available to respond to emergencies  [facility] immediately available    Physician(s)  Dr. Loleta Books    Location  [facility]    Staff Present  Maurice Small, RN, BSN;Jackson Fetters, MS, ACSM RCEP, Exercise Physiologist;Annedrea Stackhouse, RN, [place]    Medication changes reported      No    Fall or balance concerns reported     No    Tobacco Cessation  No Change    Warm-up and Cool-down  Performed as group-led instruction    Resistance Training Performed  Yes    VAD Patient?  No    PAD/SET Patient?  No      Pain Assessment   Currently in Pain?  No/denies    Multiple Pain Sites  No       Capillary Blood Glucose: No results found for this or any previous visit (from the past 24 hour(s)).    Social History   Tobacco Use  Smoking Status Never Smoker  Smokeless Tobacco Never Used    Goals Met:  Exercise tolerated well  Goals Unmet:  Not Applicable  Comments: Service time is from 10:30a to 12:30p    Dr. Rush Farmer is Medical Director for Pulmonary Rehab at [facility].

## 2017-10-08 ENCOUNTER — Encounter (HOSPITAL_COMMUNITY)
Admission: RE | Admit: 2017-10-08 | Discharge: 2017-10-08 | Disposition: A | Discharge status: Home / Self Care | Payer: Medicare Other | Source: Ambulatory Visit | Attending: Internal Medicine | Admitting: Internal Medicine

## 2017-10-08 VITALS — Wt 97.0 lb

## 2017-10-08 DIAGNOSIS — J479 Bronchiectasis, uncomplicated: Secondary | ICD-10-CM | POA: Diagnosis not present

## 2017-10-08 DIAGNOSIS — M1990 Unspecified osteoarthritis, unspecified site: Secondary | ICD-10-CM | POA: Diagnosis not present

## 2017-10-08 DIAGNOSIS — F419 Anxiety disorder, unspecified: Secondary | ICD-10-CM | POA: Insufficient documentation

## 2017-10-08 DIAGNOSIS — M810 Age-related osteoporosis without current pathological fracture: Secondary | ICD-10-CM | POA: Insufficient documentation

## 2017-10-08 DIAGNOSIS — Z79899 Other long term (current) drug therapy: Secondary | ICD-10-CM | POA: Diagnosis not present

## 2017-10-08 DIAGNOSIS — I10 Essential (primary) hypertension: Secondary | ICD-10-CM | POA: Insufficient documentation

## 2017-10-08 DIAGNOSIS — Z85828 Personal history of other malignant neoplasm of skin: Secondary | ICD-10-CM | POA: Diagnosis not present

## 2017-10-08 DIAGNOSIS — Z8673 Personal history of transient ischemic attack (TIA), and cerebral infarction without residual deficits: Secondary | ICD-10-CM | POA: Diagnosis not present

## 2017-10-08 NOTE — Progress Notes (Signed)
Daily Session Note  Patient Details  Name: Mia Hernandez MRN: [id number] Date of Birth: Jan 15, 1940 Referring Provider:     PULMONARY REHAB OTHER RESPIRATORY from 08/13/2017 in [place]  Referring Provider  Dr. Annamaria Boots      Encounter Date: 10/08/2017  Check In: Session Check In - 10/08/17 1241      Check-In   Supervising physician immediately available to respond to emergencies  [facility] immediately available    Physician(s)  Dr. Louanne Belton    Location  [facility]    Staff Present  Maurice Small, RN, BSN;Molly DiVincenzo, MS, ACSM RCEP, Exercise Physiologist;Ashely Joshua Ysidro Evert, Felipe Drone, RN, MHA    Medication changes reported      No    Fall or balance concerns reported     No    Tobacco Cessation  No Change    Warm-up and Cool-down  Performed as group-led instruction    Resistance Training Performed  Yes    VAD Patient?  No    PAD/SET Patient?  No      Pain Assessment   Currently in Pain?  No/denies    Pain Score  0-No pain    Multiple Pain Sites  No       Capillary Blood Glucose: No results found for this or any previous visit (from the past 24 hour(s)).  Exercise Prescription Changes - 10/08/17 1500      Response to Exercise   Blood Pressure (Admit)  118/80    Blood Pressure (Exercise)  136/80    Blood Pressure (Exit)  120/70    Heart Rate (Admit)  86 bpm    Heart Rate (Exercise)  96 bpm    Heart Rate (Exit)  64 bpm    Oxygen Saturation (Admit)  98 %    Oxygen Saturation (Exercise)  93 %    Oxygen Saturation (Exit)  94 %    Rating of Perceived Exertion (Exercise)  11    Perceived Dyspnea (Exercise)  2    Duration  Progress to 45 minutes of aerobic exercise without signs/symptoms of physical distress    Intensity  THRR unchanged      Progression   Progression  Continue to progress workloads to maintain intensity without signs/symptoms of physical distress.      Resistance Training   Training  Prescription  Yes    Weight  orange bands    Reps  10-15    Time  10 Minutes      Recumbant Bike   Level  3    Watts  10    Minutes  17      NuStep   Level  3    SPM  80    Minutes  17      Track   Laps  17    Minutes  17       Social History   Tobacco Use  Smoking Status Never Smoker  Smokeless Tobacco Never Used    Goals Met:  Exercise tolerated well No report of cardiac concerns or symptoms Strength training completed today  Goals Unmet:  Not Applicable  Comments: Service time is from 1030 to 1215    Dr. Rush Farmer is Medical Director for Pulmonary Rehab at [facility].

## 2017-10-09 ENCOUNTER — Telehealth: Payer: Self-pay

## 2017-10-09 NOTE — Telephone Encounter (Signed)
Patient called today stating she is having increase episodes of cough since stopping Azithromycin. Patient would like to schedule an appointment with Dr. Tommy Medal or Terri Piedra, Np to see if she would be able to restart the medication. Scheduled an appointment with Terri Piedra, Np. Will route message to Dr. Tommy Medal and Terri Piedra, [place], [place]

## 2017-10-10 ENCOUNTER — Telehealth (HOSPITAL_COMMUNITY): Payer: Self-pay | Admitting: Family Medicine

## 2017-10-10 ENCOUNTER — Encounter (HOSPITAL_COMMUNITY): Payer: Medicare Other

## 2017-10-10 NOTE — Telephone Encounter (Signed)
She should be on a full regimen not monotherapy. If considering monotherapy I would run it by Brigitte Pulse at [facility]

## 2017-10-14 ENCOUNTER — Ambulatory Visit (INDEPENDENT_AMBULATORY_CARE_PROVIDER_SITE_OTHER): Payer: Medicare Other | Admitting: Physician Assistant

## 2017-10-14 ENCOUNTER — Other Ambulatory Visit: Payer: Self-pay

## 2017-10-14 ENCOUNTER — Encounter: Payer: Self-pay | Admitting: Physician Assistant

## 2017-10-14 ENCOUNTER — Telehealth (HOSPITAL_COMMUNITY): Payer: Self-pay | Admitting: Family Medicine

## 2017-10-14 ENCOUNTER — Ambulatory Visit: Payer: Medicare Other | Admitting: Family

## 2017-10-14 VITALS — BP 138/80 | HR 85 | Temp 97.4°F | Resp 14 | Ht 60.0 in | Wt 95.0 lb

## 2017-10-14 DIAGNOSIS — M545 Low back pain, unspecified: Secondary | ICD-10-CM

## 2017-10-14 DIAGNOSIS — K5900 Constipation, unspecified: Secondary | ICD-10-CM | POA: Diagnosis not present

## 2017-10-14 NOTE — Patient Instructions (Signed)
I encourage you to increase hydration and the amount of fiber in your diet.  Start a daily probiotic (Align, Culturelle, Digestive Advantage, etc.). If no bowel movement within 24 hours, take 2 Tbs of Milk of Magnesia in a 4 oz glass of warmed prune juice every 2-3 days to help promote bowel movement. If no results within 24 hours, then repeat above regimen, adding a Dulcolax stool softener to regimen. If this does not promote a bowel movement, please call the office.  Please apply a heating pad to the lower back and abdomen for 10 minutes, a couple of times per day. Tylenol every 4-6 hours as needed for back pain. Limit heavy lifting or overexertion.  Let us know if symptoms are not continuing to improve.

## 2017-10-14 NOTE — Progress Notes (Signed)
Patient presents to clinic today c/o few days of back pain and bilateral lower abdominal pain associated with constipation. Denies trauma or injury. Notes no bowel movement since Wednesday of last week. Took a Dulcolax at bedtime last night and finally had a substantial bowel movement this morning. Notes some improvement since that time.  Past Medical History:  Diagnosis Date  . Acute left eye pain 11/15/2014  . Adverse effect of general anesthetic    DELERIUM  . ALLERGIC RHINITIS   . Anxiety   . Arthritis   . Bronchiectasis ([place])   . Cancer ([place])    basal cell skin cancer removed  . Chronic back pain   . Complication of anesthesia    s/p lung surgery at [place] X 2 MONTHS  . Diverticulosis   . Dysphagia 10/13/2014  . Hydropneumothorax 11/15/2014  . Hypertension   . Hyponatremia 09/15/2014  . Internal hemorrhoids   . Irritable bowel syndrome with constipation   . 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 Osteoporosis   . Stroke ([place])    TIA  SEVERAL YEARS AGO X 1 (76YRS -30 YRS AGO)  . Subcutaneous emphysema ([place]) 10/13/2014  . Toe pain 11/15/2014   Current Outpatient Medications on File Prior to Visit  Medication Sig Dispense Refill  . azithromycin (ZITHROMAX) 250 MG tablet Take 250 mg by mouth daily.    . irbesartan (AVAPRO) 150 MG tablet Take 1 tablet (150 mg total) by mouth daily. 90 tablet 3   No current facility-administered medications on file prior to visit.     Allergies  Allergen Reactions  . Ethambutol Palpitations    Possible optic neuritis, possible gout attack  . Latex Rash  . Penicillins Hives    Breaks out in hives  . Prednisone Other (See Comments)     "out of mind" state  . Rifabutin Other (See Comments)    Loss of vision and anorexia  . Aspirin   . Betadine [Povidone Iodine]   . Codeine   . Doxycycline    faints  . Iodinated Diagnostic Agents   . 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 Morphine And Related   . Sulfa Antibiotics Other (See Comments)  . Azithromycin Other (See Comments)    Heart rhythm issues-prolonged QT wave    Family History  Problem Relation Age of Onset  . Breast cancer Mother   . Stroke Father   . Pulmonary embolism Brother     Social History   Socioeconomic History  . Marital status: Married    Spouse name: Not on file  . Number of children: Not on file  . Years of education: Not on file  . Highest education level: Not on file  Occupational History  . Occupation: retired  Scientific laboratory technician  . Financial resource strain: Not on file  . Food insecurity:    Worry: Not on file    Inability: Not on file  . Transportation needs:    Medical: Not on file    Non-medical: Not on file  Tobacco Use  . Smoking status: Never Smoker  . Smokeless tobacco: Never Used  Substance and Sexual Activity  . Alcohol use: No  . Drug use: No  . Sexual activity: Not on file  Lifestyle  . Physical activity:    Days per week: Not on file    Minutes  per session: Not on file  . Stress: Not on file  Relationships  . Social connections:    Talks on phone: Not on file    Gets together: Not on file    Attends religious service: Not on file    Active member of club or organization: Not on file    Attends meetings of clubs or organizations: Not on file    Relationship status: Not on file  Other Topics Concern  . Not on file  Social History Narrative  . Not on file   Review of Systems - See HPI.  All other ROS are negative.  BP 138/80   Pulse 85   Temp (!) 97.4 F (36.3 C) (Oral)   Resp 14   Ht 5' (1.524 m)   Wt 95 lb (43.1 kg)   SpO2 95%   BMI 18.55 kg/m   Physical Exam  Constitutional: She appears well-developed and well-nourished.  HENT:  Head: Normocephalic and atraumatic.  Eyes: Pupils are equal, round, and reactive to light.    Cardiovascular: Normal rate, regular rhythm, normal heart sounds and intact distal pulses.  Pulmonary/Chest: Effort normal.  Abdominal: Normal appearance and bowel sounds are normal. There is no tenderness.  Musculoskeletal:       Lumbar back: Normal.  Vitals reviewed.   Recent Results (from the past 2160 hour(s))  Hepatic function panel     Status: None   Collection Time: 08/02/17 10:54 AM  Result Value Ref Range   Total Bilirubin 0.6 0.2 - 1.2 mg/dL   Bilirubin, Direct 0.1 0.0 - 0.3 mg/dL   Alkaline Phosphatase 69 39 - 117 U/L   AST 27 0 - 37 U/L   ALT 30 0 - 35 U/L   Total Protein 7.9 6.0 - 8.3 g/dL   Albumin 3.6 3.5 - 5.2 g/dL  Basic Metabolic Panel (BMET)     Status: Abnormal   Collection Time: 08/02/17 10:54 AM  Result Value Ref Range   Sodium 129 (L) 135 - 145 mEq/L   Potassium 4.1 3.5 - 5.1 mEq/L   Chloride 90 (L) 96 - 112 mEq/L   CO2 34 (H) 19 - 32 mEq/L   Glucose, Bld 102 (H) 70 - 99 mg/dL   BUN 9 6 - 23 mg/dL   Creatinine, Ser 0.46 0.40 - 1.20 mg/dL   Calcium 9.2 8.4 - 10.5 mg/dL   GFR 139.72 >60.00 mL/min  CBC w/Diff     Status: Abnormal   Collection Time: 08/02/17 10:54 AM  Result Value Ref Range   WBC 9.3 4.0 - 10.5 K/uL   RBC 4.23 3.87 - 5.11 Mil/uL   Hemoglobin 12.6 12.0 - 15.0 g/dL   HCT 37.4 36.0 - 46.0 %   MCV 88.4 78.0 - 100.0 fl   MCHC 33.6 30.0 - 36.0 g/dL   RDW 15.2 11.5 - 15.5 %   Platelets 451.0 (H) 150.0 - 400.0 K/uL   Neutrophils Relative % 75.9 43.0 - 77.0 %   Lymphocytes Relative 10.1 (L) 12.0 - 46.0 %   Monocytes Relative 10.9 3.0 - 12.0 %   Eosinophils Relative 2.1 0.0 - 5.0 %   Basophils Relative 1.0 0.0 - 3.0 %   Neutro Abs 7.0 1.4 - 7.7 K/uL   Lymphs Abs 0.9 0.7 - 4.0 K/uL   Monocytes Absolute 1.0 0.1 - 1.0 K/uL   Eosinophils Absolute 0.2 0.0 - 0.7 K/uL   Basophils Absolute 0.1 0.0 - 0.1 K/uL   MYCOBACTERIA, CULTURE, WITH FLUOROCHROME SMEAR  Status: Abnormal   Collection Time: 08/07/17 10:55 AM  Result Value Ref Range    MICRO NUMBER: [id number]    SPECIMEN QUALITY: ADEQUATE    Source: SPUTUM    STATUS: FINAL    SMEAR: (A)     Moderate (3 +) acid-fast bacilli seen using the fluorochrome method.   ISOLATE 1: Mycobacterium, avium-intracellulare group  (A)     Comment: DNA probe result positive for Mycobacterium avium complex  Fungus Culture & Smear     Status: Abnormal   Collection Time: 08/07/17 10:55 AM  Result Value Ref Range   MICRO NUMBER: [id number]    SPECIMEN QUALITY: ADEQUATE    Source: SPUTUM    STATUS: FINAL    SMEAR: Few Budding yeast with pseudohyphae seen (A)    CULTURE: yeast (A)     Comment: Scant growth of Yeast present not further identified No additional fungi isolated after 4 weeks  Respiratory or Resp and Sputum Culture     Status: Abnormal   Collection Time: 08/07/17 10:55 AM  Result Value Ref Range   MICRO NUMBER: [id number]    SPECIMEN QUALITY: ADEQUATE    Source SPUTUM    STATUS: FINAL    GRAM STAIN: yeast (A)     Comment: Gram stain indicates that the specimen is representative of the lower respiratory tract. Few White blood cells seen Few Gram positive cocci Rare Yeast present   RESULT: Normal oropharyngeal flora also present.    ISOLATE 1: Yeast Isolated.     Comment: Scant growth of Yeast Isolated. Please contact the laboratory within 3 days if further identification is desired.    Assessment/Plan: 1. Constipation, unspecified constipation type Contributor to back pain. No BM for 5 days until early this morning. Had significant BM with resolution of abdominal discomfort and improvement in back pain. Exam unremarkable. Bowel regimen given and reviewed with patient and husband. Follow-up with GI as scheduled on Wednesday.  2. Acute bilateral low back pain without sciatica Muscle strain from therapy. Improving. Wants to avoid any Rx medications. Start Heating pad in 10-15 minute intervals a few times per day. Tylenol OTC as needed.    Leeanne Rio, PA-C

## 2017-10-15 ENCOUNTER — Encounter (HOSPITAL_COMMUNITY): Payer: Medicare Other

## 2017-10-17 ENCOUNTER — Telehealth: Payer: Self-pay | Admitting: Internal Medicine

## 2017-10-17 ENCOUNTER — Encounter (HOSPITAL_COMMUNITY): Payer: Medicare Other

## 2017-10-17 ENCOUNTER — Ambulatory Visit (INDEPENDENT_AMBULATORY_CARE_PROVIDER_SITE_OTHER): Payer: Medicare Other | Admitting: Physician Assistant

## 2017-10-17 ENCOUNTER — Ambulatory Visit (INDEPENDENT_AMBULATORY_CARE_PROVIDER_SITE_OTHER)
Admission: RE | Admit: 2017-10-17 | Discharge: 2017-10-17 | Disposition: A | Discharge status: Home / Self Care | Payer: Medicare Other | Source: Ambulatory Visit | Attending: Physician Assistant | Admitting: Physician Assistant

## 2017-10-17 ENCOUNTER — Telehealth (HOSPITAL_COMMUNITY): Payer: Self-pay | Admitting: Family Medicine

## 2017-10-17 ENCOUNTER — Other Ambulatory Visit: Payer: Self-pay | Admitting: Orthopedic Surgery

## 2017-10-17 ENCOUNTER — Encounter: Payer: Self-pay | Admitting: Physician Assistant

## 2017-10-17 VITALS — BP 110/70 | HR 95 | Ht 61.0 in | Wt 94.0 lb

## 2017-10-17 DIAGNOSIS — M542 Cervicalgia: Secondary | ICD-10-CM | POA: Diagnosis not present

## 2017-10-17 DIAGNOSIS — R140 Abdominal distension (gaseous): Secondary | ICD-10-CM

## 2017-10-17 DIAGNOSIS — K5900 Constipation, unspecified: Secondary | ICD-10-CM

## 2017-10-17 DIAGNOSIS — R1084 Generalized abdominal pain: Secondary | ICD-10-CM

## 2017-10-17 DIAGNOSIS — R109 Unspecified abdominal pain: Secondary | ICD-10-CM | POA: Diagnosis not present

## 2017-10-17 DIAGNOSIS — M546 Pain in thoracic spine: Secondary | ICD-10-CM | POA: Diagnosis not present

## 2017-10-17 NOTE — Progress Notes (Signed)
Agree with assessment and plan as outlined.  

## 2017-10-17 NOTE — Progress Notes (Signed)
Subjective:    Patient ID: Mia Hernandez, female    DOB: 04/01/1940, 77 y.o.   MRN: [id number]  HPI Mia Hernandez is a pleasant 77 year old white female known to Mia Hernandez who was last seen in our office in February 2019.  She has had chronic complaints of abdominal bloating and distention which she associates with irritable bowel.  She has tried several meds in the past which she has not found helpful including Bentyl, probiotics and She had MRI of her abdomen in October 2018 which was negative .  She has had allergic reaction to write for Butin and did not want to try Xifaxan.  Previous celiac serologies negative.  Last colonoscopy 2010 in [place] showed left colon diverticuli and was otherwise negative.  On Patient has ongoing problems with bronchiectasis and has required numerous and ongoing antibiotics.  She is currently on Zithromax 250 mg daily chronically. She comes in today with more acute symptoms which she said started last week after she had attended pulmonary rehab.  She says she was working on a bike which required her to full with her arms and she developed pain and a pulling sensation across her upper abdomen and then around into her back.  Following day her symptoms were much worse and then have persistent since.  She is having a difficult time sleeping due to discomfort.  She says her abdomen feels bloated and tight and she is not having any increased symptoms with inspiration no cough etc. She can localize pain in her back to her lower thoracic vertebrae, and has had a prior compression fracture in her lumbar spine. She has an appointment with orthopedics later today. She has been significantly constipated since then and last bowel movement was about 3 days ago.  Occasionally has mild constipation and uses Dulcolax.  Review of Systems Pertinent positive and negative review of systems were noted in the above HPI section.  All other review of systems was otherwise negative.  Outpatient  Encounter Medications as of 10/17/2017  Medication Sig  . azithromycin (ZITHROMAX) 250 MG tablet Take 250 mg by mouth daily.  . irbesartan (AVAPRO) 150 MG tablet Take 1 tablet (150 mg total) by mouth daily.   No facility-administered encounter medications on file as of 10/17/2017.    Allergies  Allergen Reactions  . Ethambutol Palpitations    Possible optic neuritis, possible gout attack  . Latex Rash  . Penicillins Hives    Breaks out in hives  . Prednisone Other (See Comments)     "out of mind" state  . Rifabutin Other (See Comments)    Loss of vision and anorexia  . Aspirin   . Betadine [Povidone Iodine]   . Codeine   . Doxycycline     faints  . Iodinated Diagnostic Agents   . 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 Morphine And Related   . Sulfa Antibiotics Other (See Comments)  . Azithromycin Other (See Comments)    Heart rhythm issues-prolonged QT wave   Patient Active Problem List   Diagnosis Date Noted  . IBS (irritable bowel syndrome) 04/04/2016  . Pleural effusion 02/16/2015  . 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 Toe pain 11/15/2014  . Acute left eye pain 11/15/2014  . 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 Subcutaneous emphysema ([place]) 10/13/2014  . Dysphagia 10/13/2014  . Arthritis 09/15/2014  . Temporary cerebral vascular dysfunction 09/15/2014  . 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 Hyponatremia 09/15/2014  .  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 QT prolongation 03/24/2014  . Asymptomatic LV dysfunction 11/11/2013  . Cardiac disease 11/11/2013  . Abnormal resting ECG findings 10/22/2013  . Abnormal ECG 10/22/2013  . Rhinitis 07/11/2013  . Chronic rhinitis 07/11/2013  . Essential (primary) hypertension 06/05/2012  . 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 Bronchiectasis ([place])   . Cough 04/22/2011   Social History   Socioeconomic History  . Marital status: Married    Spouse name: Not on  file  . Number of children: Not on file  . Years of education: Not on file  . Highest education level: Not on file  Occupational History  . Occupation: retired  Scientific laboratory technician  . Financial resource strain: Not on file  . Food insecurity:    Worry: Not on file    Inability: Not on file  . Transportation needs:    Medical: Not on file    Non-medical: Not on file  Tobacco Use  . Smoking status: Never Smoker  . Smokeless tobacco: Never Used  Substance and Sexual Activity  . Alcohol use: No  . Drug use: No  . Sexual activity: Not on file  Lifestyle  . Physical activity:    Days per week: Not on file    Minutes per session: Not on file  . Stress: Not on file  Relationships  . Social connections:    Talks on phone: Not on file    Gets together: Not on file    Attends religious service: Not on file    Active member of club or organization: Not on file    Attends meetings of clubs or organizations: Not on file    Relationship status: Not on file  . Intimate partner violence:    Fear of current or ex partner: Not on file    Emotionally abused: Not on file    Physically abused: Not on file    Forced sexual activity: Not on file  Other Topics Concern  . Not on file  Social History Narrative  . Not on file    Mia Hernandez's family history includes Breast cancer in her mother; Pulmonary embolism in her brother; Stroke in her father.      Objective:    Vitals:   10/17/17 0901  BP: 110/70  Pulse: 95    Physical Exam; well-developed elderly white female in no acute distress, she ambulates slowly and uncomfortably.  Accompanied by her husband blood pressure 110/70 pulse 95, height 5 foot 1, weight 94, BMI 17.7.  HEENT; nontraumatic normocephalic EOMI PERRLA sclera anicteric oral mucosa moist, Cardiovascular; regular rate and rhythm with S1-S2, Pulmonary; few scattered rhonchi, Back-she has some tenderness along the lower thoracic vertebrae and across the mid back bilaterally.   Abdomen; soft, protuberant bowel sounds somewhat hyperactive she has a mild rather generalized tenderness no guarding or rebound no palpable mass or hepatosplenomegaly, Rectal; exam not done, Extremities ;no clubbing cyanosis or edema skin warm and dry, Neuro psych; alert and oriented, grossly nonfocal mood and affect appropriate       Assessment & Plan:   #79 77 year old white female with acute onset of mid back pain radiating around bilaterally into the upper abdomen, and associated with abdominal distention and pain over the past 10 days.  Patient has had associated constipation as well which is unusual for her.   Acute onset of symptoms after she had attended pulmonary rehab and used an exercise machine that required her to repeatedly pull with her arms .  I suspect she has an acute back injury, possibly acute compression fracture of the lower thoracic spine with associated bandlike upper abdominal discomfort distention and constipation.  #2 chronic more mild IBS symptoms with bloating and gas- #3 bronchiectasis, on chronic antibiotics #4.  Cerebrovascular disease #5.  History of myocarditis. #6.  Osteoporosis #7.  Diverticulosis.  Plan; will obtain acute abdominal films today. Patient has appointment to see orthopedics later today I have asked him to call back to update [place] regarding diagnosis. If her a MiraLAX purge with 17 g of MiraLAX in 8 ounces of water every 15 minutes x6-8 doses, then advised her to continue MiraLAX 17 g in 8 ounces of water once daily.  She may also use Dulcolax 1-2 daily as needed. I asked her to increase her water intake to 60 ounces per day. Will plan to follow her up in the office in 7 to 10 days.   Aritzel Krusemark S Zada Haser PA-C 10/17/2017   Cc: Midge Minium, MD

## 2017-10-17 NOTE — Telephone Encounter (Signed)
ATC patient LMTCB x1

## 2017-10-17 NOTE — Patient Instructions (Addendum)
Your provider has requested that you go to the basement level Radiology department before leaving today. Press "B" on the elevator. The radiology department is located at the first door on the left as you exit the elevator.  Do a Miralax purge- take 17 grams in 6-8 ounces of water every 15-20- minutes for 6 doses.  Then take Miralax 17 grams in 8 oz of water daily.   Follow up with Nicoletta Ba PA on 10-29-2017 at 1:30 PM. We made you an appointment with Dr. Belvidere Cellar on 11-25-2017 at 10:30 am.   You have seen the GI department today. We suspect acute compression  fracture, Thorasic spine with secondary bowel dysfunction and bloating.  Normal BMI (Body Mass Index- based on height and weight) is between 23 and 30. Your BMI today is Body mass index is 17.76 kg/m. Marland Kitchen Please consider follow up  regarding your BMI with your Primary Care Provider.

## 2017-10-18 NOTE — Telephone Encounter (Signed)
Noted  

## 2017-10-18 NOTE — Telephone Encounter (Signed)
Called and spoke with pt who stated she had to stop rehab due to pain in her back and a compressed fracture in middle of back. Pt states the compressed fracture in the back was causing problems with her abdomen stated per Ortho and GI.  Pt stated the xray stated an acute fracture. Pt states her last day of rehab before she had to stop was 10/10/17  Pt has an MRI scheduled 10/16.  Routing this to CY as an [place].

## 2017-10-22 ENCOUNTER — Encounter (HOSPITAL_COMMUNITY): Payer: Medicare Other

## 2017-10-23 ENCOUNTER — Ambulatory Visit
Admission: RE | Admit: 2017-10-23 | Discharge: 2017-10-23 | Disposition: A | Discharge status: Home / Self Care | Payer: Medicare Other | Source: Ambulatory Visit | Attending: Orthopedic Surgery | Admitting: Orthopedic Surgery

## 2017-10-23 ENCOUNTER — Ambulatory Visit: Payer: Medicare Other | Admitting: Family Medicine

## 2017-10-23 DIAGNOSIS — M546 Pain in thoracic spine: Secondary | ICD-10-CM

## 2017-10-23 DIAGNOSIS — S22069A Unspecified fracture of T7-T8 vertebra, initial encounter for closed fracture: Secondary | ICD-10-CM | POA: Diagnosis not present

## 2017-10-24 ENCOUNTER — Encounter (HOSPITAL_COMMUNITY): Payer: Medicare Other

## 2017-10-25 ENCOUNTER — Ambulatory Visit: Payer: Medicare Other | Admitting: Family Medicine

## 2017-10-28 ENCOUNTER — Ambulatory Visit: Payer: Medicare Other | Admitting: Family Medicine

## 2017-10-29 ENCOUNTER — Ambulatory Visit (INDEPENDENT_AMBULATORY_CARE_PROVIDER_SITE_OTHER): Payer: Medicare Other | Admitting: Physician Assistant

## 2017-10-29 ENCOUNTER — Encounter: Payer: Self-pay | Admitting: Physician Assistant

## 2017-10-29 ENCOUNTER — Encounter (HOSPITAL_COMMUNITY): Payer: Medicare Other

## 2017-10-29 VITALS — BP 128/80 | HR 84 | Ht 60.0 in | Wt 94.0 lb

## 2017-10-29 DIAGNOSIS — K5901 Slow transit constipation: Secondary | ICD-10-CM

## 2017-10-29 DIAGNOSIS — K581 Irritable bowel syndrome with constipation: Secondary | ICD-10-CM

## 2017-10-29 NOTE — Patient Instructions (Addendum)
Take Gas-X 30 minutes before a meal.  Continue Miralax or Dulcolax as needed every other day if no BM.   Follow up with Dr. Havery Moros on 11-25-2017 at 10:30 am.   Normal BMI (Body Mass Index- based on height and weight) is between 23 and 30. Your BMI today is Body mass index is 18.36 kg/m. Marland Kitchen Please consider follow up  regarding your BMI with your Primary Care Provider.

## 2017-10-29 NOTE — Progress Notes (Signed)
Agree with assessment and plan as outlined.  

## 2017-10-29 NOTE — Progress Notes (Signed)
Subjective:    Patient ID: Mia Hernandez, female    DOB: Sep 19, 1940, 77 y.o.   MRN: [id number]  HPI Mia Hernandez is a pleasant 77 year old white female, known to Dr. Havery Moros who was last seen in the office about 2 weeks ago by myself, and 77 years old, for follow-up. She has history of hypertension, IBS, bronchiectasis currently on chronic Zithromax, history of myocarditis. She came in 2 weeks ago with acute upper abdominal discomfort bloating and obstipation as well as onset of acute mid back pain which had started the week previous after she had been exercising at pulmonary rehab and using a bike that caused her to do repetitive pulling motions. It was suspected that she had sustained an acute back injury, possibly a compression fracture of her lower thoracic spine with associated bandlike upper abdominal pain and sensation of distention.    We obtained abdominal films which did show some gas-filled small bowel and colon loops nondilated and a significant amount of stool in the right colon. She was given a MiraLAX purge, then asked to take MiraLAX on a daily basis thereafter in addition to using Dulcolax as needed.  She had already arranged follow-up with orthopedics. She did undergo an MRI of the spine on 10/23/2017 which showed an acute T8 compression fracture, subacute T9 and T10 compression fractures, and a chronic T6 and chronic L1-L2 compression fracture.  She has an appointment later this week with Dr. Casandra Doffing who has followed her long-term. Apparently kyphoplasty had been suggested by orthopedics but she wanted to confer with Dr. Trenton Gammon.    Overall she says her back pain is not quite as severe at this point and she is able to turn over in bed.  77 has affected her appetite. Was unable to do the MiraLAX purge due to nausea, and wound up taking extra Dulcolax says she had good results and then actually had some days of loose stools after that.  She had no bowel movement yesterday  and had a normal bowel movement today.  She expresses that she feels horrible if she does not have a bowel movement every day as this and increases upper abdominal discomfort and pressure.   As outlined in prior notes she has not responded to multiple different medications for IBS and constipation in the past.  Review of Systems Pertinent positive and negative review of systems were noted in the above HPI section.  All other review of systems was otherwise negative.  Outpatient Encounter Medications as of 10/29/2017  Medication Sig  . azithromycin (ZITHROMAX) 250 MG tablet Take 250 mg by mouth daily.  . irbesartan (AVAPRO) 150 MG tablet Take 1 tablet (150 mg total) by mouth daily.   No facility-administered encounter medications on file as of 10/29/2017.    Allergies  Allergen Reactions  . Ethambutol Palpitations    Possible optic neuritis, possible gout attack  . Latex Rash  . Penicillins Hives    Breaks out in hives  . Prednisone Other (See Comments)     "out of mind" state  . Rifabutin Other (See Comments)    Loss of vision and anorexia  . Aspirin   . Betadine [Povidone Iodine]   . Codeine   . Doxycycline     faints  . Iodinated Diagnostic Agents   . 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 Morphine And Related   . Sulfa Antibiotics Other (See Comments)  . Azithromycin Other (See Comments)    Heart rhythm  issues-prolonged QT wave   Patient Active Problem List   Diagnosis Date Noted  . IBS (irritable bowel syndrome) 04/04/2016  . Pleural effusion 02/16/2015  . 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 Toe pain 11/15/2014  . Acute left eye pain 11/15/2014  . 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 Subcutaneous emphysema ([place]) 10/13/2014  . Dysphagia 10/13/2014  . Arthritis 09/15/2014  . Temporary cerebral vascular dysfunction 09/15/2014  . 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 Hyponatremia 09/15/2014  . History of myocarditis  08/23/2014  . QT prolongation 03/24/2014  . Asymptomatic LV dysfunction 11/11/2013  . Cardiac disease 11/11/2013  . Abnormal resting ECG findings 10/22/2013  . Abnormal ECG 10/22/2013  . Rhinitis 07/11/2013  . Chronic rhinitis 07/11/2013  . Essential (primary) hypertension 06/05/2012  . 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 Bronchiectasis ([place])   . Cough 04/22/2011   Social History   Socioeconomic History  . Marital status: Married    Spouse name: Not on file  . Number of children: Not on file  . Years of education: Not on file  . Highest education level: Not on file  Occupational History  . Occupation: retired  Scientific laboratory technician  . Financial resource strain: Not on file  . Food insecurity:    Worry: Not on file    Inability: Not on file  . Transportation needs:    Medical: Not on file    Non-medical: Not on file  Tobacco Use  . Smoking status: Never Smoker  . Smokeless tobacco: Never Used  Substance and Sexual Activity  . Alcohol use: No  . Drug use: No  . Sexual activity: Not on file  Lifestyle  . Physical activity:    Days per week: Not on file    Minutes per session: Not on file  . Stress: Not on file  Relationships  . Social connections:    Talks on phone: Not on file    Gets together: Not on file    Attends religious service: Not on file    Active member of club or organization: Not on file    Attends meetings of clubs or organizations: Not on file    Relationship status: Not on file  . Intimate partner violence:    Fear of current or ex partner: Not on file    Emotionally abused: Not on file    Physically abused: Not on file    Forced sexual activity: Not on file  Other Topics Concern  . Not on file  Social History Narrative  . Not on file    Mia Hernandez's family history includes Breast cancer in her mother; Pulmonary embolism in her brother; Stroke in her father.      Objective:    Vitals:   10/29/17 1327  BP: 128/80  Pulse: 84      Physical Exam; well-developed elderly white female in no acute distress, accompanied by her husband both pleasant blood pressure 128/80 pulse 84, BMI 18.3.  HEENT ;nontraumatic normocephalic EOMI PERRLA sclera anicteric oral mucosa moist, Cardiovascular; regular rate and rhythm with S1-S2 Pulmonary creased breath sounds bilateral bases, she has significant kyphosis.  Abdomen ;soft, nondistended bowel sounds are active there is no palpable mass or hepatosplenomegaly.  Rectal; exam not done, Extremities; no clubbing cyanosis or edema skin warm and dry, Ambulating better but still with difficulty due to back pain Neuro psych ;alert and oriented, grossly nonfocal mood and affect appropriate       Assessment & Plan:   #  53 77 year old white female with history of IBS on a chronic complaints of abdominal bloating and constipation with acute exacerbation of symptoms over the past couple of weeks with onset early at the time of back injury. She has since had MRI of the spine that confirmed an acute T8 compression fracture and subacute T9 and T10 compression fractures in addition to her previously known chronic compression fractures.  I think most of her GI symptoms are associated with the acute back injury ,She is also very kyphotic, and with bronchiectasis , she is having increase in symptoms due to some compression of her upper abdominal organs.  #2 bronchiectasis-currently on chronic Zithromax #3.  Hypertension #4.  Diverticulosis-last colonoscopy 2010 no polyps  Plan; Patient will continue MiraLAX or Dulcolax every other day as needed if she does not have good bowel movement. Start trial of Gas-X 30 minutes before meals She will follow-up with neurosurgery later this week, and expect that her GI symptoms will improve as her compression fracture heals. She had previous follow-up scheduled with Dr. Havery Moros in November, and will keep that appointment for now.  Mia Hernandez Genia Harold  PA-C 10/29/2017   Cc: Midge Minium, MD

## 2017-10-31 ENCOUNTER — Encounter (HOSPITAL_COMMUNITY): Payer: Medicare Other

## 2017-10-31 DIAGNOSIS — S22060A Wedge compression fracture of T7-T8 vertebra, initial encounter for closed fracture: Secondary | ICD-10-CM | POA: Diagnosis not present

## 2017-10-31 DIAGNOSIS — S22070A Wedge compression fracture of T9-T10 vertebra, initial encounter for closed fracture: Secondary | ICD-10-CM | POA: Diagnosis not present

## 2017-11-01 DIAGNOSIS — Y999 Unspecified external cause status: Secondary | ICD-10-CM | POA: Diagnosis not present

## 2017-11-01 DIAGNOSIS — S22060A Wedge compression fracture of T7-T8 vertebra, initial encounter for closed fracture: Secondary | ICD-10-CM | POA: Diagnosis not present

## 2017-11-01 DIAGNOSIS — S22070A Wedge compression fracture of T9-T10 vertebra, initial encounter for closed fracture: Secondary | ICD-10-CM | POA: Diagnosis not present

## 2017-11-01 DIAGNOSIS — X58XXXA Exposure to other specified factors, initial encounter: Secondary | ICD-10-CM | POA: Diagnosis not present

## 2017-11-01 DIAGNOSIS — Y929 Unspecified place or not applicable: Secondary | ICD-10-CM | POA: Diagnosis not present

## 2017-11-01 DIAGNOSIS — Y939 Activity, unspecified: Secondary | ICD-10-CM | POA: Diagnosis not present

## 2017-11-05 ENCOUNTER — Encounter (HOSPITAL_COMMUNITY): Payer: Medicare Other

## 2017-11-07 ENCOUNTER — Encounter (HOSPITAL_COMMUNITY): Payer: Medicare Other

## 2017-11-12 ENCOUNTER — Encounter (HOSPITAL_COMMUNITY): Payer: Medicare Other

## 2017-11-14 ENCOUNTER — Encounter (HOSPITAL_COMMUNITY): Payer: Medicare Other

## 2017-11-19 ENCOUNTER — Encounter (HOSPITAL_COMMUNITY): Payer: Medicare Other

## 2017-11-21 ENCOUNTER — Encounter (HOSPITAL_COMMUNITY): Payer: Medicare Other

## 2017-11-25 ENCOUNTER — Ambulatory Visit (INDEPENDENT_AMBULATORY_CARE_PROVIDER_SITE_OTHER): Payer: Medicare Other | Admitting: Gastroenterology

## 2017-11-25 ENCOUNTER — Encounter: Payer: Self-pay | Admitting: Gastroenterology

## 2017-11-25 VITALS — BP 126/84 | HR 72 | Ht 59.0 in | Wt 91.4 lb

## 2017-11-25 DIAGNOSIS — K5909 Other constipation: Secondary | ICD-10-CM

## 2017-11-25 DIAGNOSIS — Z8781 Personal history of (healed) traumatic fracture: Secondary | ICD-10-CM

## 2017-11-25 DIAGNOSIS — R140 Abdominal distension (gaseous): Secondary | ICD-10-CM

## 2017-11-25 MED ORDER — LINACLOTIDE 72 MCG PO CAPS: 72.0000 ug | ORAL_CAPSULE | Freq: Every day | ORAL | 0 refills | Status: DC

## 2017-11-25 NOTE — Progress Notes (Signed)
HPI :  77 year old female here for follow-up visit. She is known to [place] for history of chronic bloating. She has significant comorbidities to include MAC / bronchiectasis, pleural effusion. Most recently she was seen by Nicoletta Ba for acute back pain in October. She had imaging performed which showed multiple compression fractures. MRI showed acute T8 compression fracture, T9/T10 compression fracture, chronic T6 and L1-L2 compression fractures. She's been seen by a spine specialist, Dr. Trenton Gammon, had injections to the spine since her last visit. She reports the back pain is improved however continues to persist. She has chronic back pain and discomfort there, when she lies down she feels which were comfortable. She feels that due to her back pain, she can't relax her abdomen enough, she continues to have ongoing bloating and altered bowel habits. Again when she lies down her stomach feels much better.  She has been taking MiraLAX daily, she says when she takes it she will reliably have a bowel movement and this tends to make her stomach feeling a bit better. She is concerned that the MiraLAX can make her nauseated at times that she has some gagging. She had an x-ray performed in October showing a nonspecific nonobstructive bowel gas pattern with a large amount of stool retained in the right colon. We have previously discussed several treatment options which she is tried to include probiotics, simethicone, Beano, IBR, low FODMAP diet. I previously discussed offered her a trial of rifaximin which she will not try given her severe allergy to rifabutin. She's tested negative for celiac disease. Enteropathy due to irbesartan as possible and has been reported but she has not had any diarrhea. We discussed about whether or not to try holding this, which she did not want to do. She is on a very strong IV antibiotic regimen for MAC, this did not help her at all.   MRI abdomen - 11/02/2016 - prominent stool burden, R  pleural effusion small, bronchiectasis, mild intrahepatic dilation likely secondary to cholecystectomy, chronic compression fx  Colonoscopy 01/29/2008 - diverticulosis  Past Medical History:  Diagnosis Date  . Acute left eye pain 11/15/2014  . Adverse effect of general anesthetic    DELERIUM  . ALLERGIC RHINITIS   . Anxiety   . Arthritis   . Bronchiectasis ([place])   . Cancer ([place])    basal cell skin cancer removed  . Chronic back pain   . Complication of anesthesia    s/p lung surgery at [facility] X 2 MONTHS  . Diverticulosis   . Dysphagia 10/13/2014  . Hydropneumothorax 11/15/2014  . Hypertension   . Hyponatremia 09/15/2014  . Internal hemorrhoids   . Irritable bowel syndrome with constipation   . 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 Osteoporosis   . Stroke ([place])    TIA  SEVERAL YEARS AGO X 1 (5YRS -30 YRS AGO)  . Subcutaneous emphysema ([place]) 10/13/2014  . Toe pain 11/15/2014     Past Surgical History:  Procedure Laterality Date  . BREAST BIOPSY    . BREAST LUMPECTOMY WITH RADIOACTIVE SEED LOCALIZATION Left 10/05/2015   Procedure: LEFT BREAST LUMPECTOMY WITH RADIOACTIVE SEED LOCALIZATION;  Surgeon: Excell Seltzer, MD;  Location: [facility];  Service: General;  Laterality: Left;  . CATARACT EXTRACTION W/ INTRAOCULAR LENS  IMPLANT, BILATERAL    . CHOLECYSTECTOMY    . LAMINECTOMY     cervical   .  VAGINAL HYSTERECTOMY     Family History  Problem Relation Age of Onset  . Breast cancer Mother   . Stroke Father   . Pulmonary embolism Brother    Social History   Tobacco Use  . Smoking status: Never Smoker  . Smokeless tobacco: Never Used  Substance Use Topics  . Alcohol use: No  . Drug use: No   Current Outpatient Medications  Medication Sig Dispense Refill  . azithromycin (ZITHROMAX) 250 MG tablet Take 250 mg by mouth daily.      . irbesartan (AVAPRO) 150 MG tablet Take 1 tablet (150 mg total) by mouth daily. 90 tablet 3  . polyethylene glycol (MIRALAX / GLYCOLAX) packet Take 17 g by mouth daily.     No current facility-administered medications for this visit.    Allergies  Allergen Reactions  . Ethambutol Palpitations    Possible optic neuritis, possible gout attack  . Latex Rash  . Penicillins Hives    Breaks out in hives  . Prednisone Other (See Comments)     "out of mind" state  . Rifabutin Other (See Comments)    Loss of vision and anorexia  . Aspirin   . Betadine [Povidone Iodine]   . Codeine   . Doxycycline     faints  . Iodinated Diagnostic Agents   . 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 Morphine And Related   . Sulfa Antibiotics Other (See Comments)  . Azithromycin Other (See Comments)    Heart rhythm issues-prolonged QT wave     Review of Systems: All systems reviewed and negative except where noted in HPI.   Lab Results  Component Value Date   WBC 9.3 08/02/2017   HGB 12.6 08/02/2017   HCT 37.4 08/02/2017   MCV 88.4 08/02/2017   PLT 451.0 (H) 08/02/2017     Lab Results  Component Value Date   CREATININE 0.46 08/02/2017   BUN 9 08/02/2017   NA 129 (L) 08/02/2017   K 4.1 08/02/2017   CL 90 (L) 08/02/2017   CO2 34 (H) 08/02/2017    Lab Results  Component Value Date   ALT 30 08/02/2017   AST 27 08/02/2017   ALKPHOS 69 08/02/2017   BILITOT 0.6 08/02/2017     Physical Exam: BP 126/84   Pulse 72   Ht _0  (1.499 m)   Wt 91 lb 6 oz (41.4 kg)   BMI 18.46 kg/m  Constitutional: Pleasant, thin female in no acute distress. HEENT: Normocephalic and atraumatic. Conjunctivae are normal. No scleral icterus. Neck supple.  Cardiovascular: Normal rate, regular rhythm.  Pulmonary/chest: Effort normal and breath sounds normal. No wheezing, rales or rhonchi. Abdominal: Soft, nondistended, nontender. . There are no masses palpable.  Extremities: no  edema Lymphadenopathy: No cervical adenopathy noted. Neurological: Alert and oriented to person place and time. Skin: Skin is warm and dry. No rashes noted. Psychiatric: Normal mood and affect. Behavior is normal.   ASSESSMENT AND PLAN: 77 year old female here for reassessment;  Chronic constipation / bloating / compression fractures - Chronic symptoms but made worse by acute compression fractures for which she has been seen by surgery and received injection. Overall she does appear improved with therapy of this although continues to be symptomatic. She is kyphotic and her symptoms could be in part related to her back issues. MiraLAX is helping move her bowels regularly and she feels better with this, although she does have some gagging and nausea with the MiraLAX.  We discussed other options. She has significant intolerances to multiple regimens in the past, several allergies noted. We discussed options. I provided her a sample of low-dose Linzess 23mg has a trial to see if she tolerates that any better the MiraLAX. I counseled her on the side effects of Linzess. Otherwise I offered her some Bentyl and Zofran which she also declined. We discussed given her age and comorbidities if she wished to have any further colonoscopy exams in the future. I do not think she will tolerate a bowel preparation in her current state, she wishes to hold off on this for now and can think about it. She can follow-up with me as needed for these issues.  SCarolina Cellar MD [facility]

## 2017-11-25 NOTE — Patient Instructions (Signed)
If you are age 78 or older, your body mass index should be between 23-30. Your Body mass index is 18.46 kg/m. If this is out of the aforementioned range listed, please consider follow up with your Primary Care Provider.  If you are age 71 or younger, your body mass index should be between 19-25. Your Body mass index is 18.46 kg/m. If this is out of the aformentioned range listed, please consider follow up with your Primary Care Provider.   Continue Miralax.  We are giving you samples today of Linzess 41mcg you can try.  Thank you for entrusting me with your care and for choosing [facility], Dr. [place] Cellar

## 2017-12-02 ENCOUNTER — Encounter: Payer: Self-pay | Admitting: Infectious Disease

## 2017-12-02 ENCOUNTER — Ambulatory Visit (INDEPENDENT_AMBULATORY_CARE_PROVIDER_SITE_OTHER): Payer: Medicare Other | Admitting: Infectious Disease

## 2017-12-02 VITALS — BP 179/105 | HR 89 | Temp 98.0°F | Ht 59.0 in | Wt 90.0 lb

## 2017-12-02 DIAGNOSIS — A310 Pulmonary mycobacterial infection: Secondary | ICD-10-CM | POA: Diagnosis not present

## 2017-12-02 DIAGNOSIS — J479 Bronchiectasis, uncomplicated: Secondary | ICD-10-CM

## 2017-12-02 DIAGNOSIS — K581 Irritable bowel syndrome with constipation: Secondary | ICD-10-CM

## 2017-12-02 NOTE — Progress Notes (Signed)
Never easy

## 2017-12-02 NOTE — Progress Notes (Signed)
[facility] Infectious Disease   Chief complaint: Follow-up for Mycobacterium avium infection  Subjective:      Patient ID: Mia Hernandez, female    DOB: 1940/11/03, 77 y.o.   MRN: [id number]  HPI 77 year old lady with past medical history significant for diagnosis of Mycobacterium avium infection of the lungs, recent Nocardia  INTERVAL HiSTORY:     Started on therapy with clarithromycin rifampin and ethambutol 3 days per week. She had great difficulty tolerating the Biaxin which was she was taking it twice daily doses on the day she was taking it was reduced to once daily dosing on July 01 2012 Apparently at that time there was concern about toxicity although she states that now that she was found to need simply new hearing aids. Initially while on therapy for Mycobacterium avium showed mprovement in her symptoms of chronic daily cough and sputum production.   In Fall of 2013 while [place] 2013 apparently she had episodes of hemoptysis and was hospitalized due to Hospital in [place] where she was placed in isolation for rule out tuberculosis. Ultimately was realized that she was suffering from Mycobacterium avium infection alone.  She had repeat sputum taken Spring of 2014 and again grow Mycobacterium avium. The organism was sent for susceptibility testing and showed macrolide sensitivity and in vitro synergy with rifampin and ETH in inhibiting growth in lab. Amikacin would also be considered active at S she had.  I had Changed her to  daily therapy with macrolide (Azithromycin) ethambutol and rifampin. Since change she had been initiallycoughing less, she was gaining weight and was  absent fevers.   She saw Dr. Annamaria Boots in November 2014 when she reported 1 month she is coughing up more yellow/liquid phlem since on Rafampin. Chest x-ray was done and showed no changes. Sputum was sent for routine culture which only isolated normal oral pharyngeal flora. AFB culture is without AFB seen  on smear blood cultures in November DID grow M avium again   I had tried to get her on daily RIFABUTIN with continue azithro and ETH but she did not tolerate at all with severe anorexia, and apparent visual problems, stating that "I could not see for several hours."  She went back to Rifampin TIW,  azithro 544m daily, ETH 7068mTU, TH< SA< SU  In the interim she was seen by her Cardiologist Dr. CrSallyanne Kusterwho saw her in October and found NEW  deep symmetrical inverted T waves in all the inferior leads as well as V3-V6. The QTC prolongation on EKG that persistd on followup EKG. he was concern for possible antibiotic induced myocarditis and the patient stopped her anti-Mycobacterium drugs and had MRI of her heart performed which was normal echocardiogram was also normal.  Repeat EKG done in the next few days showed resolution of T wave changes and improvement in QT  Since coming off her anti-Mycobacterium avium drugs she had continued to cough on a daily basis   She had CXR that showed expansion of cavitary disease though most recent CXR showed stable findings.   Note her M avium was R to Moxifloxacin with MIC of 4.  It was I to linezolid with MIC of 16  It was S to clarithro with MIC of 2  Further MICs were:  Amikacin 16 Cipro 16 Ethambutol 8 Ethimodate > INH 8 Rifampin >8 Rifabutin 0.5 Streptomcin 32  Combination of ETH with clarithro, erythrom, rifampin, rifabutin all produced no growth in vitro  See below  She came to our clinic multiple times in  Spring 2016 with worsening cough, with blood tinged sputum and actual hemoptysis, fatigue, lack of energy.  I corresponded with Dr Lorenda Cahill via email and he recommended not starting any therapy until he had a chance to review her case himself when she comes to visit him. She is on schedule to see him in August.  She was worked into clinic before seeing Dr. Lorenda Cahill with  worsening blood tinged sputum and we ultimately decided  upon consultation with Dr Lorenda Cahill and with Dr Sallyanne Kuster to re-challenge the patient with Azithro and [facility] which we did and there have been no EKG changes so far to suggest pericarditis , myocarditis or issues with QT prolongation.  Since she started on meds she has had improvement in sputum, no longer blood tinged and she is now able to sleep.  She was then seen  Dr Lorenda Cahill who collected sputum sample (though I cannot access it in [place]). Dr. Lorenda Cahill felt upon review of her films that her pulmonary disease was amenable to combined medical and surgical approach with resection of large cavitary areas from lungs to be considered by Dr. Elenor Quinones at [facility].   In the interm she grew Nocardia species from sputum and has been started on zyvox which she was tolerating  But then developed nose blleeds.  She ultimately underwent surgery at [facility] on 09/27/14 with bronchoscopy, thoracoscopy with removal of 2 lobes of lung. She was changed from zyvox to rocephin (to which her Norardia was S) and has remained on this with plan of 6 weeks of IV abx. It is believed that the infected part of lung was removed with surgery. She also grew M avium from prior culture from Dr.Stout's office.  Her cough had DRAMATICALLY improved postoperatively but she did develop significant postoperative subcutaneous emphysema. She had resumed her azithromycin and ETH. She has had some dysphagia due to subcutaneous emphysema but this is improving.  A few visits since then she  developed NEW complaints of acute left toe pain, with pain in her left eye and decreased visual acuity, along with malaise, nausea and reduced hearing.She stopped EThambutol and with our advise also the azithromycin to avoid monotherapy for her M avium.   WE had in the interim considered procuring clofazamine. Dr. Brigitte Pulse at [facility] corresponded with me and was very skeptical that the patients eye pain was due to [facility] and recommended rechallenge with [facility] at 55m/kg TIW  along with continued Azithro first.   Unfortunately when she restarted those meds she began experiencing eye pain, joint pain and palpitations and stopped all f these meds again in [place].  She CLAIMED at one of her more recent visits that  [place] there was confirmation of the azithromycin causing her to have QT prolongation and myocarditis.  I see that she did have an EKG on January 06, 2016 which showed frequent PVCs but I do not see changes consistent with myocarditis.  Furthermore her QTC interval was similar in August 2018 when she was off of medications.   I had not seen her since that visit in the December 2017.  In the interim she has had worsening weight loss which she partly attributes to IBS.  She also though has had worsening cough at times with hemoptysis.  Imaging including CT scan of the lungs was done recently which shows November 2018:  1. Progressive bronchiectasis involving the right lower lung with areas of cylindrical and cystic bronchiectasis.  Patchy airspace nodules and endobronchial debris/ mucous plugging possibly related to a bronchiectasis related opportunistic infection. 2. Patchy areas of tree-in-bud appearance suggesting chronic inflammation or MAC. 3. No focal airspace consolidation, pulmonary edema or worrisome pulmonary mass. 4. Chronic right-sided pleural thickening and loculated fluid.  She is referred back to [place] by Dr. Annamaria Boots and she has yet again unsurprisingly growing Mycobacterium avium from her sputum cultures.   We have worked to obtain inhaled amikacin + clofazimine and azithromycin to come up with a salvage regimen given her intolerances of different meds.  She has her INH amikacin and would like 3 month supply of azithro as is cheaper for her. We do NOT yet have clofazimine.  She has been continuing to have daily severe cough that brings up copious sputum and blood tinged material at times. She has lost weight , has poor appetite and  frequent sore throat. She has had problems with dizziness as well. She has issues with bloating and  Urge to defecate and being worked up for IBS and being considered for endoscopy.  We have since been able to start her sequentially on azithromycin, clofazamine and more recently INH amikacin.  She noted that her hemoptysis stopped shortly after starting the macrolide. She also had EKG done with Dr. Sallyanne Kuster.  Which showed no abnormalities on EKG.  He states that when she started taking clofazimine it did change the color of her sputum.  She feels that since she started inhaled amikacin that the inhaled amikacin is causing her to cough more and she is at times not been taking it as she did yesterday.  She has a myriad of symptoms, many of which I cannot clearly attribute to her medications.  She continues to cough though it seems like that is better than before though now she thinks the amikacin is making her cough more and this certainly could be the case since it is inhaled medication.  She is going to go to pulmonary rehab per Dr. Janee Morn instructions.  She was  telling me that she is having trouble sleeping due to the coughing still she says that she has trouble thinking straight that she has trouble with dizziness.  She also has problems with nausea with abdominal pain.  She is having less loose bowel movements than before.  As her hearing was checked recently and has not changed.  With anything she complains of having profound fatigue and feeling exhausted.  He had been on a regimen of azithromycin and clofazimine and inhaled amikacin.  However she began attributing multiple symptoms to the inhaled amikacin and clofazimine and stopped these medications against our advice.  She continued on azithromycin after restarting it when she developed hemoptysis.  She has stayed on this and despite having been seen by our nurse practitioner Terri Piedra. And recommended to STOP monotherapy for  MAvium she continued on monotherapy.   UnFortunately she is suffered multiple fractures recently which is made it difficult for her to have much of an way of appetite and she is lost some weight.  Past Medical History:  Diagnosis Date  . Acute left eye pain 11/15/2014  . Adverse effect of general anesthetic    DELERIUM  . ALLERGIC RHINITIS   . Anxiety   . Arthritis   . Bronchiectasis ([place])   . Cancer ([place])    basal cell skin cancer removed  . Chronic back pain   . Complication of anesthesia    s/p lung surgery at [facility]  X 2 MONTHS  . Diverticulosis   . Dysphagia 10/13/2014  . Hydropneumothorax 11/15/2014  . Hypertension   . Hyponatremia 09/15/2014  . Internal hemorrhoids   . Irritable bowel syndrome with constipation   . Mycobacterium avium-intracellulare infection ([place])   . 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 Osteoporosis   . Stroke ([place])    TIA  SEVERAL YEARS AGO X 1 (9YRS -30 YRS AGO)  . Subcutaneous emphysema ([place]) 10/13/2014  . Toe pain 11/15/2014   Past Surgical History:  Procedure Laterality Date  . BREAST BIOPSY    . BREAST LUMPECTOMY WITH RADIOACTIVE SEED LOCALIZATION Left 10/05/2015   Procedure: LEFT BREAST LUMPECTOMY WITH RADIOACTIVE SEED LOCALIZATION;  Surgeon: Excell Seltzer, MD;  Location: [facility];  Service: General;  Laterality: Left;  . CATARACT EXTRACTION W/ INTRAOCULAR LENS  IMPLANT, BILATERAL    . CHOLECYSTECTOMY    . LAMINECTOMY     cervical   . VAGINAL HYSTERECTOMY     Family History  Problem Relation Age of Onset  . Breast cancer Mother   . Stroke Father   . Pulmonary embolism Brother    Social History   Tobacco Use  . Smoking status: Never Smoker  . Smokeless tobacco: Never Used  Substance Use Topics  . Alcohol use: No  . Drug use: No     Review of Systems  Constitutional: Positive for activity change, appetite change, fatigue and unexpected  weight change. Negative for diaphoresis.  HENT: Negative for congestion, sinus pressure and sneezing.   Eyes: Negative for photophobia and visual disturbance.  Respiratory: Positive for cough and shortness of breath. Negative for stridor.   Cardiovascular: Negative for palpitations and leg swelling.  Gastrointestinal: Positive for abdominal distention, abdominal pain and constipation. Negative for anal bleeding, blood in stool and vomiting.  Genitourinary: Negative for difficulty urinating, dysuria, flank pain and hematuria.  Musculoskeletal: Positive for back pain. Negative for arthralgias, gait problem and joint swelling.  Skin: Negative for color change, pallor and wound.  Neurological: Positive for light-headedness. Negative for tremors.  Hematological: Negative for adenopathy. Does not bruise/bleed easily.  Psychiatric/Behavioral: Negative for agitation, behavioral problems, dysphoric mood and sleep disturbance. The patient is nervous/anxious.   All other systems reviewed and are negative.      Objective:   Physical Exam  Constitutional: She is oriented to person, place, and time. No distress.  HENT:  Head: Normocephalic and atraumatic.  Mouth/Throat: No oropharyngeal exudate.  Eyes: Conjunctivae and EOM are normal. No scleral icterus.  Neck: Normal range of motion. Neck supple.  Cardiovascular: Normal rate, regular rhythm and normal heart sounds. Exam reveals no gallop and no friction rub.  No murmur heard. Pulmonary/Chest: Effort normal. No respiratory distress. She has decreased breath sounds in the right lower field. She has no rhonchi.    prolonged expiratory phase with "peep" noises during expiration  Abdominal: Soft. She exhibits no distension.  Musculoskeletal: She exhibits no edema or tenderness.  Neurological: She is alert and oriented to person, place, and time. She exhibits normal muscle tone. Coordination normal.  Skin: Skin is warm and dry. No rash noted. She is  not diaphoretic. No erythema. No pallor.  Psychiatric: Her behavior is normal. Judgment and thought content normal. Her mood appears anxious.  Nursing note and vitals reviewed.         Assessment & Plan:    #1  Mycobacterium Avium Intracellulare infection  sp resection of TWO lobes  of lung.  She is now on monotherapy which we do not recommend.  However if it does make her feel better it may be helpful in the short-term in the long-term she is going to undoubtedly select for macrolide resistance and we will not have options to treat her based on her multiple intolerances or apparent intolerances of drugs to treat this.     #2GI complaints: Seems to have some degree of IBS.  Certainly azithromycin can also cause GI upset but she really does not have many options to treat her mycobacterial infection.

## 2017-12-03 ENCOUNTER — Encounter: Payer: Self-pay | Admitting: Internal Medicine

## 2017-12-03 ENCOUNTER — Ambulatory Visit (INDEPENDENT_AMBULATORY_CARE_PROVIDER_SITE_OTHER): Payer: Medicare Other | Admitting: Internal Medicine

## 2017-12-03 DIAGNOSIS — J479 Bronchiectasis, uncomplicated: Secondary | ICD-10-CM

## 2017-12-03 DIAGNOSIS — A310 Pulmonary mycobacterial infection: Secondary | ICD-10-CM

## 2017-12-03 NOTE — Progress Notes (Signed)
HPI female never smoker followed for cough, bronchiectasis/MAIC , obstructive airway disease, history of myocarditis. Cavitary lesion with history of hemoptysis resected/ RML/RLL lobectomy 09/2014 , right pleural effusion, left breast cancer/seeds  +MAIC/ triple therapy begun 06/18/11- dc'd 2015 by Cardiology due to myocarditis ? Due to zith?.   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 FVC 1.76/77%, FEV1 1.12/65%, ratio 0.63, FEF 25-75% 0.59/42%. Walk test 04/02/17 on room air-lowest saturation 92% Began zith/clofazimine/ Arikayce March, 2019. ----------------------------------------------------------------------------------------- 08/02/2017-77 year old female never smoker followed for cough, bronchiectasis, obstructive airway disease , history of myocarditis Cavitary lesion with history of hemoptysis resected, RML/RLL lobectomy 09/2014 for bronchiectasis, right pleural effusion, left breast cancer/seeds/partial mastect -----Bronchiectasis: Pt states her cough is getting worse. I.D provider has taken a leave of absence and patient has not been able to get in. Pt to start Pulmonary rehab 08/09/17 for 13 weeks.  Arakayce was discontinued and she is only now taking azithromycin.  Much cough with thick yellow sputum but no hemoptysis.  Previously failed flutter device and is not strong enough to keep her airways clear with spontaneous cough.  Husband tries to help.  Denies fever or sweats.  She was hoping there would be a surgical lesion like her previous cavity, but nothing similar seen on recent chest CT.  Bronchiectasis again seen.  Over 6 months of productive cough.  12/03/2017- 77 year old female never smoker followed for cough, bronchiectasis, obstructive airway disease , history  of myocarditis Cavitary lesion with history of hemoptysis resected, RML/RLL lobectomy 09/2014 for bronchiectasis, right pleural effusion, left breast cancer/seeds/partial mastect, IBS, hearing loss -----4 month follow up for bronchiectasis. States her breathing has been ok since her last visit.  She blames pulmonary rehab and her pneumatic vest for rib fractures.  Had thoracic spine surgery. Chronic cough is little changed.  She is now only taking azithromycin, feeling that Arakayce combination therapy too many side effects.  She feels she is better with azithromycin than with nothing at all.  No recent hemoptysis.  Has Anoro at home but has not been using it. Indicates that IBS is a major quality of life problem, being addressed elsewhere. Sensorineural [facility] loss being [facility] at [facility]. CXR 08/02/2017- IMPRESSION: Postsurgical changes and changes on the right with changes of pleural-parenchymal scarring. Chronic interstitial changes with changes of bronchiectasis, particular on the right are again noted. COPD. Chest is unchanged from prior exams  ROS- see HPI    + = positive Constitutional:   No-   weight loss, night sweats, fevers, chills, fatigue, lassitude. HEENT:   No-  headaches, difficulty swallowing, tooth/dental problems, sore throat,       No-  sneezing, itching, ear ache, nasal congestion, + post nasal drip,  CV:  + Tussive left rib pain residual from fracture, orthopnea, PND, swelling in lower extremities, anasarca, dizziness, palpitations Resp: +  shortness of breath with exertion or at rest.              +  productive cough,  little non-productive cough,  + coughing up of blood.                change in color of mucus.  No- wheezing.   Skin: No-   rash or lesions. GI:  No-   heartburn, indigestion, abdominal pain, nausea, vomiting,  GU:  MS:  No-   joint pain or swelling.  Neuro-     nothing unusual Psych:  No- change in mood or affect. No depression or anxiety.  No memory  loss.  OBJ- Physical Exam     General- Alert, Oriented, Affect-appropriate, Distress- none acute, +slender, +talkative Skin- rash-none, lesions- none, excoriation- none Lymphadenopathy- none Head- atraumatic            Eyes- Gross vision intact, PERRLA, conjunctivae and secretions clear            Ears- Hearing aides            Nose- Clear, no-Septal dev, mucus, polyps, erosion, perforation             Throat- Mallampati II , mucosa clear , drainage- none, tonsils- atrophic Neck- flexible , trachea midline, no stridor , thyroid nl, carotid no bruit Chest - symmetrical excursion , unlabored           Heart/CV- +bigeminal pulse , no murmur , no gallop  , no rub, nl s1 s2                           - JVD- none , edema- none, stasis changes- none, varices- none           Lung- + distant sounds right base, few rhonchi R base, cough+ , dullness+ right base, rub- none           Chest wall-  Abd-  Br/ Gen/ Rectal- Not done, not indicated Extrem- cyanosis- none, clubbing, none, atrophy- none, strength- nl Neuro- grossly intact to observation

## 2017-12-03 NOTE — Patient Instructions (Signed)
Ok to try using Anoro a few days at a time, to see if it helps you manage your cough better.  Please call if we can help

## 2017-12-06 NOTE — Addendum Note (Signed)
Encounter addended by: Ivonne Andrew, RD on: 12/06/2017 5:54 PM  Actions taken: Flowsheet data copied forward, Visit Navigator Flowsheet section accepted

## 2017-12-12 DIAGNOSIS — I10 Essential (primary) hypertension: Secondary | ICD-10-CM | POA: Diagnosis not present

## 2017-12-12 DIAGNOSIS — S22070A Wedge compression fracture of T9-T10 vertebra, initial encounter for closed fracture: Secondary | ICD-10-CM | POA: Diagnosis not present

## 2018-01-02 NOTE — Progress Notes (Signed)
Discharge Progress Report  Patient Details  Name: Mia Hernandez MRN: [id number] Date of Birth: Oct 28, 1940 Referring Provider:     PULMONARY REHAB OTHER RESPIRATORY from 08/13/2017 in [place]  Referring Provider  Dr. Annamaria Boots       Number of Visits: 15  Reason for Discharge:  Early Exit:  back pain  Smoking History:  Social History   Tobacco Use  Smoking Status Never Smoker  Smokeless Tobacco Never Used    Diagnosis:  Bronchiectasis without complication ([place])  ADL UCSD: Pulmonary Assessment Scores    Row Name 08/14/17 1419         ADL UCSD   ADL Phase  Entry     SOB Score total  67       CAT Score   CAT Score  26        Initial Exercise Prescription: Initial Exercise Prescription - 08/15/17 0700      Date of Initial Exercise RX and Referring Provider   Date  08/15/17    Referring Provider  Dr. Annamaria Boots      Recumbant Bike   Level  1    Watts  10    Minutes  17      NuStep   Level  2    SPM  80    Minutes  17    METs  1.5      Track   Laps  9    Minutes  17      Prescription Details   Frequency (times per week)  2    Duration  Progress to 45 minutes of aerobic exercise without signs/symptoms of physical distress      Intensity   THRR 40-80% of Max Heartrate  57-114    Ratings of Perceived Exertion  11-13    Perceived Dyspnea  0-4      Progression   Progression  Continue progressive overload as per policy without signs/symptoms or physical distress.      Resistance Training   Training Prescription  Yes    Weight  orange bands    Reps  10-15       Discharge Exercise Prescription (Final Exercise Prescription Changes): Exercise Prescription Changes - 10/08/17 1500      Response to Exercise   Blood Pressure (Admit)  118/80    Blood Pressure (Exercise)  136/80    Blood Pressure (Exit)  120/70    Heart Rate (Admit)  86 bpm    Heart Rate (Exercise)  96 bpm    Heart Rate (Exit)  64 bpm    Oxygen Saturation  (Admit)  98 %    Oxygen Saturation (Exercise)  93 %    Oxygen Saturation (Exit)  94 %    Rating of Perceived Exertion (Exercise)  11    Perceived Dyspnea (Exercise)  2    Duration  Progress to 45 minutes of aerobic exercise without signs/symptoms of physical distress    Intensity  THRR unchanged      Progression   Progression  Continue to progress workloads to maintain intensity without signs/symptoms of physical distress.      Resistance Training   Training Prescription  Yes    Weight  orange bands    Reps  10-15    Time  10 Minutes      Recumbant Bike   Level  3    Watts  10    Minutes  17      NuStep   Level  4    SPM  80    Minutes  17      Track   Laps  17    Minutes  17       Functional Capacity: 6 Minute Walk    Row Name 08/15/17 0721 08/15/17 0748       6 Minute Walk   Phase  Initial  (Pended)   Initial    Distance  1375 feet  (Pended)   1375 feet    Walk Time  6 minutes  (Pended)   6 minutes    # of Rest Breaks  0  (Pended)   0    MPH  2.6  (Pended)   2.6    METS  2.99  (Pended)   2.99    RPE  11  (Pended)   11    Perceived Dyspnea   2  (Pended)   2    Symptoms  No  (Pended)   No    Resting HR  86 bpm  (Pended)   86 bpm    Resting BP  150/80  (Pended)   150/80    Resting Oxygen Saturation   97 %  (Pended)   97 %    Exercise Oxygen Saturation  during 6 min walk  93 %  (Pended)   93 %    Max Ex. HR  107 bpm  (Pended)   107 bpm    Max Ex. BP  160/90  (Pended)   160/90    2 Minute Post BP  166/90  (Pended)   166/90      Interval HR   1 Minute HR  92  (Pended)   92    2 Minute HR  98  (Pended)   98    3 Minute HR  99  (Pended)   99    4 Minute HR  105  (Pended)   105    5 Minute HR  107  (Pended)   107    6 Minute HR  100  (Pended)   100    2 Minute Post HR  94  (Pended)   94    Interval Heart Rate?  Yes  (Pended)   Yes      Interval Oxygen   Interval Oxygen?  Yes  (Pended)   Yes    Baseline Oxygen Saturation %  97 %  (Pended)   97 %    1  Minute Oxygen Saturation %  97 %  (Pended)   97 %    1 Minute Liters of Oxygen  0 L  (Pended)   0 L    2 Minute Oxygen Saturation %  94 %  (Pended)   94 %    2 Minute Liters of Oxygen  0 L  (Pended)   0 L    3 Minute Oxygen Saturation %  93 %  (Pended)   93 %    3 Minute Liters of Oxygen  0 L  (Pended)   0 L    4 Minute Oxygen Saturation %  94 %  (Pended)   94 %    4 Minute Liters of Oxygen  0 L  (Pended)   0 L    5 Minute Oxygen Saturation %  -  95 %    5 Minute Liters of Oxygen  -  0 L    6 Minute Oxygen Saturation %  -  96 %    6 Minute Liters of Oxygen  -  0 L    2 Minute Post Oxygen Saturation %  -  97 %    2 Minute Post Liters of Oxygen  -  0 L       Psychological, QOL, Others - Outcomes: PHQ 2/9: Depression screen [facility] 2/9 12/02/2017 08/09/2017 06/05/2017 04/01/2017 03/18/2017  Decreased Interest 0 0 0 0 0  Down, Depressed, Hopeless 0 0 0 0 0  PHQ - 2 Score 0 0 0 0 0  Altered sleeping - 0 - - -  Tired, decreased energy - 1 - - -  Change in appetite - 0 - - -  Feeling bad or failure about yourself  - 0 - - -  Trouble concentrating - 0 - - -  Moving slowly or fidgety/restless - 0 - - -  Suicidal thoughts - 0 - - -  PHQ-9 Score - 1 - - -  Difficult doing work/chores - Not difficult at all - - -  Some recent data might be hidden    Quality of Life:   Personal Goals: Goals established at orientation with interventions provided to work toward goal. Personal Goals and Risk Factors at Admission - 08/09/17 0952      Core Components/Risk Factors/Patient Goals on Admission    Weight Management  Yes;Weight Gain    Intervention  Obesity: Provide education and appropriate resources to help participant work on and attain dietary goals.;Weight Management: Provide education and appropriate resources to help participant work on and attain dietary goals.;Weight Management: Develop a combined nutrition and exercise program designed to reach desired caloric intake, while maintaining  appropriate intake of nutrient and fiber, sodium and fats, and appropriate energy expenditure required for the weight goal.    Admit Weight  93 lb 0.6 oz (42.2 kg)    Goal Weight: Short Term  96 lb 8 oz (43.8 kg)    Goal Weight: Long Term  100 lb (45.4 kg)    Expected Outcomes  Short Term: Continue to assess and modify interventions until short term weight is achieved;Long Term: Adherence to nutrition and physical activity/exercise program aimed toward attainment of established weight goal;Understanding of distribution of calorie intake throughout the day with the consumption of 4-5 meals/snacks;Weight Gain: Understanding of general recommendations for a high calorie, high protein meal plan that promotes weight gain by distributing calorie intake throughout the day with the consumption for 4-5 meals, snacks, and/or supplements    Improve shortness of breath with ADL's  Yes    Intervention  Provide education, individualized exercise plan and daily activity instruction to help decrease symptoms of SOB with activities of daily living.    Expected Outcomes  Short Term: Improve cardiorespiratory fitness to achieve a reduction of symptoms when performing ADLs;Long Term: Be able to perform more ADLs without symptoms or delay the onset of symptoms    Stress  Yes    Intervention  Offer individual and/or small group education and counseling on adjustment to heart disease, stress management and health-related lifestyle change. Teach and support self-help strategies.    Expected Outcomes  Short Term: Participant demonstrates changes in health-related behavior, relaxation and other stress management skills, ability to obtain effective social support, and compliance with psychotropic medications if prescribed.;Long Term: Emotional wellbeing is indicated by absence of clinically significant psychosocial distress or social isolation.        Personal Goals Discharge: Goals and Risk Factor Review    Row Name 08/27/17  1626 08/28/17 0929 09/25/17 1531 01/02/18 1518  Core Components/Risk Factors/Patient Goals Review   Personal Goals Review  Weight Management/Obesity;Improve shortness of breath with ADL's;Increase knowledge of respiratory medications and ability to use respiratory devices properly.;Develop more efficient breathing techniques such as purse lipped breathing and diaphragmatic breathing and practicing self-pacing with activity.;Stress  -  Weight Management/Obesity;Improve shortness of breath with ADL's;Increase knowledge of respiratory medications and ability to use respiratory devices properly.;Develop more efficient breathing techniques such as purse lipped breathing and diaphragmatic breathing and practicing self-pacing with activity.;Stress  Weight Management/Obesity;Improve shortness of breath with ADL's;Increase knowledge of respiratory medications and ability to use respiratory devices properly.;Develop more efficient breathing techniques such as purse lipped breathing and diaphragmatic breathing and practicing self-pacing with activity.;Stress    Review  I do anticipate in the next 30 days pt will make progress toward goals.  -  Pt has completed 11 exercise sessions. Pt has gained .4 kg since initial session toward her goal to gain weight. Pt has attended respiratory medication class. Pt uses PLB breathing effectively but is unsure if this is really helping her.  Pt rates a level 0-1 for dypnea. Pt husband is coping well with his cancer treatments and is able to accompany pt to class.  Pt able to ambulate 16 laps around the track in 15 minutes, inrease nustep to level 3 and recombent bike remains level 2 - thisis pt least favorite exercie!Marland Kitchen  continue to montior pt progress during the next 30 days.  Pt discharged from pulmonary rehab with the completion of 15 exercise sessions.  Pt unable to return due to chronic back pain that was agravated with exercise.    Expected Outcomes  -  See Admission  Goals/Outcomes  See Admission Goals/Outcomes  Pt made progress toward program goals and outcomes.       Exercise Goals and Review: Exercise Goals    Row Name 08/09/17 1003             Exercise Goals   Increase Physical Activity  Yes       Intervention  Provide advice, education, support and counseling about physical activity/exercise needs.;Develop an individualized exercise prescription for aerobic and resistive training based on initial evaluation findings, risk stratification, comorbidities and participant's personal goals.       Expected Outcomes  Short Term: Attend rehab on a regular basis to increase amount of physical activity.;Long Term: Add in home exercise to make exercise part of routine and to increase amount of physical activity.;Long Term: Exercising regularly at least 3-5 days a week.       Increase Strength and Stamina  Yes       Intervention  Provide advice, education, support and counseling about physical activity/exercise needs.;Develop an individualized exercise prescription for aerobic and resistive training based on initial evaluation findings, risk stratification, comorbidities and participant's personal goals.       Expected Outcomes  Short Term: Increase workloads from initial exercise prescription for resistance, speed, and METs.;Short Term: Perform resistance training exercises routinely during rehab and add in resistance training at home;Long Term: Improve cardiorespiratory fitness, muscular endurance and strength as measured by increased METs and functional capacity (6MWT)       Able to understand and use rate of perceived exertion (RPE) scale  Yes       Intervention  Provide education and explanation on how to use RPE scale       Expected Outcomes  Short Term: Able to use RPE daily in rehab to express subjective intensity level;Long Term:  Able to use  RPE to guide intensity level when exercising independently       Able to understand and use Dyspnea scale  Yes        Intervention  Provide education and explanation on how to use Dyspnea scale       Expected Outcomes  Short Term: Able to use Dyspnea scale daily in rehab to express subjective sense of shortness of breath during exertion;Long Term: Able to use Dyspnea scale to guide intensity level when exercising independently       Knowledge and understanding of Target Heart Rate Range (THRR)  Yes       Intervention  Provide education and explanation of THRR including how the numbers were predicted and where they are located for reference       Expected Outcomes  Short Term: Able to state/look up THRR;Long Term: Able to use THRR to govern intensity when exercising independently;Short Term: Able to use daily as guideline for intensity in rehab       Understanding of Exercise Prescription  Yes       Intervention  Provide education, explanation, and written materials on patient's individual exercise prescription       Expected Outcomes  Short Term: Able to explain program exercise prescription;Long Term: Able to explain home exercise prescription to exercise independently          Exercise Goals Re-Evaluation: Exercise Goals Re-Evaluation    Row Name 08/26/17 1204 09/23/17 1611           Exercise Goal Re-Evaluation   Exercise Goals Review  Increase Physical Activity;Increase Strength and Stamina;Able to understand and use rate of perceived exertion (RPE) scale;Able to understand and use Dyspnea scale;Knowledge and understanding of Target Heart Rate Range (THRR);Understanding of Exercise Prescription  Increase Physical Activity;Increase Strength and Stamina;Able to understand and use rate of perceived exertion (RPE) scale;Able to understand and use Dyspnea scale;Knowledge and understanding of Target Heart Rate Range (THRR);Understanding of Exercise Prescription      Comments  The patient has only attended 2 rehab sessions. Will cont. to monitor and progress as able.  The patient is able to walk 15 laps (200 ft  each) in 15 minutes. Encouraged patient to work at a level of medium to somewhat hard. Will talk about home exerrcise with patient and husband since patient can be at times forgetful.       Expected Outcomes  Through exercise at rehab and at home, the patient will increase physical capacity making ADL's easier to perform. They will also become comfortable with developing and carrying out an exercise regime at home.   Through exercise at rehab and at home, the patient will increase physical capacity making ADL's easier to perform. They will also become comfortable with developing and carrying out an exercise regime at home.          Nutrition & Weight - Outcomes:    Nutrition: Nutrition Therapy & Goals - 08/13/17 1348      Nutrition Therapy   Diet  high proteinm high calorie      Personal Nutrition Goals   Nutrition Goal  Pt to identify and increase food sources high in protein and calories    Personal Goal #2  The pt will consume high-energy, high-nutrient dense beverages when necessary to compensate for decreased oral intake of solid foods.    Personal Goal #3  Identify food quantities necessary to achieve wt maintenance or gain of  -2# per week to a goal wt gain of 2.7-10.9 kg (6-24  lb) at graduation from pulmonary rehab.      Intervention Plan   Intervention  Prescribe, educate and counsel regarding individualized specific dietary modifications aiming towards targeted core components such as weight, hypertension, lipid management, diabetes, heart failure and other comorbidities.    Expected Outcomes  Short Term Goal: Understand basic principles of dietary content, such as calories, fat, sodium, cholesterol and nutrients.       Nutrition Discharge: Nutrition Assessments - 12/06/17 1753      Rate Your Plate Scores   Pre Score  35    Post Score  --   unable to assess as pt did not return survey      Education Questionnaire Score: Knowledge Questionnaire Score - 08/14/17 1419       Knowledge Questionnaire Score   Pre Score  14/18       Goals reviewed with patient. Cherre Huger, BSN Cardiac and Training and development officer

## 2018-01-02 NOTE — Addendum Note (Signed)
Encounter addended by: Rowe Pavy, RN on: 01/02/2018 4:11 PM  Actions taken: Flowsheet data copied forward, Visit Navigator Flowsheet section accepted, Clinical Note Signed, Episode resolved

## 2018-01-20 DIAGNOSIS — H6121 Impacted cerumen, right ear: Secondary | ICD-10-CM | POA: Diagnosis not present

## 2018-01-20 DIAGNOSIS — H938X2 Other specified disorders of left ear: Secondary | ICD-10-CM | POA: Diagnosis not present

## 2018-02-05 ENCOUNTER — Ambulatory Visit (INDEPENDENT_AMBULATORY_CARE_PROVIDER_SITE_OTHER): Payer: Medicare Other | Admitting: Gastroenterology

## 2018-02-05 ENCOUNTER — Encounter: Payer: Self-pay | Admitting: Gastroenterology

## 2018-02-05 VITALS — BP 142/84 | HR 80 | Ht 59.0 in | Wt 94.0 lb

## 2018-02-05 DIAGNOSIS — R140 Abdominal distension (gaseous): Secondary | ICD-10-CM

## 2018-02-05 DIAGNOSIS — K5909 Other constipation: Secondary | ICD-10-CM

## 2018-02-05 NOTE — Progress Notes (Signed)
HPI :  78 year old female here for follow-up visit. She has a history of chronic bloating and chronic constipation, with comorbidities to include MAC / bronchiectasis, pleural effusion, and multiple compression fractures of her spine. She has had the compression fractures treated recently and is recovering. She continues to have some pain with coughing at her ribs and back. She is on chronic antibiotics for MAC.   Overall she's pretty well since I've seen her. She is using MiraLAX as needed for her constipation which is generally worked quite well. Infection is only taking it as needed at this time, it's bowels are moving fairly regularly on the round. She does not have much abdominal distention or bloating when her bowels are moving regularly. I did give her a sample of Linzess 72 g to use as needed, she has not needed to take that. We discussed whether or not she wanted any further screening colonoscopies, as her last exam was in 2010. As discussed below, she does not wish to have any further colonoscopies given her comorbidities. Otherwise states she feels at baseline and stable.  MRI abdomen - 11/02/2016 - prominent stool burden, R pleural effusion small, bronchiectasis, mild intrahepatic dilation likely secondary to cholecystectomy, chronic compression fx  Colonoscopy 01/29/2008 - diverticulosis  Past Medical History:  Diagnosis Date  . Acute left eye pain 11/15/2014  . Adverse effect of general anesthetic    DELERIUM  . ALLERGIC RHINITIS   . Anxiety   . Arthritis   . Bronchiectasis ([place])   . Cancer ([place])    basal cell skin cancer removed  . Chronic back pain   . Complication of anesthesia    s/p lung surgery at [place] X 2 MONTHS  . Diverticulosis   . Dysphagia 10/13/2014  . Hydropneumothorax 11/15/2014  . Hypertension   . Hyponatremia 09/15/2014  . Internal hemorrhoids   . Irritable bowel syndrome with constipation   . 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 Osteoporosis   . Stroke ([place])    TIA  SEVERAL YEARS AGO X 1 (19YRS -30 YRS AGO)  . Subcutaneous emphysema ([place]) 10/13/2014  . Toe pain 11/15/2014     Past Surgical History:  Procedure Laterality Date  . BREAST BIOPSY    . BREAST LUMPECTOMY WITH RADIOACTIVE SEED LOCALIZATION Left 10/05/2015   Procedure: LEFT BREAST LUMPECTOMY WITH RADIOACTIVE SEED LOCALIZATION;  Surgeon: Excell Seltzer, MD;  Location: [facility];  Service: General;  Laterality: Left;  . CATARACT EXTRACTION W/ INTRAOCULAR LENS  IMPLANT, BILATERAL    . CHOLECYSTECTOMY    . LAMINECTOMY     cervical   . VAGINAL HYSTERECTOMY     Family History  Problem Relation Age of Onset  . Breast cancer Mother   . Stroke Father   . Pulmonary embolism Brother    Social History   Tobacco Use  . Smoking status: Never Smoker  . Smokeless tobacco: Never Used  Substance Use Topics  . Alcohol use: No  . Drug use: No   Current Outpatient Medications  Medication Sig Dispense Refill  . azithromycin (ZITHROMAX) 250 MG tablet Take 250 mg by mouth daily.    . irbesartan (AVAPRO) 150 MG tablet Take 1 tablet (150 mg total) by mouth daily. 90 tablet 3  . polyethylene glycol (MIRALAX / GLYCOLAX) packet Take 17 g by mouth daily as needed.      No  current facility-administered medications for this visit.    Allergies  Allergen Reactions  . Ethambutol Palpitations    Possible optic neuritis, possible gout attack  . Latex Rash  . Penicillins Hives    Breaks out in hives  . Prednisone Other (See Comments)     "out of mind" state  . Rifabutin Other (See Comments)    Loss of vision and anorexia  . Aspirin   . Betadine [Povidone Iodine]   . Codeine   . Doxycycline     faints  . Iodinated Diagnostic Agents   . 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 Morphine And Related   . Sulfa Antibiotics  Other (See Comments)  . Azithromycin Other (See Comments)    Heart rhythm issues-prolonged QT wave     Review of Systems: All systems reviewed and negative except where noted in HPI.   Lab Results  Component Value Date   WBC 9.3 08/02/2017   HGB 12.6 08/02/2017   HCT 37.4 08/02/2017   MCV 88.4 08/02/2017   PLT 451.0 (H) 08/02/2017    Lab Results  Component Value Date   CREATININE 0.46 08/02/2017   BUN 9 08/02/2017   NA 129 (L) 08/02/2017   K 4.1 08/02/2017   CL 90 (L) 08/02/2017   CO2 34 (H) 08/02/2017    Lab Results  Component Value Date   ALT 30 08/02/2017   AST 27 08/02/2017   ALKPHOS 69 08/02/2017   BILITOT 0.6 08/02/2017     Physical Exam: BP (!) 142/84   Pulse 80   Ht 4' 11"  (1.499 m)   Wt 94 lb (42.6 kg)   BMI 18.99 kg/m  Constitutional: Pleasant,well-developed, female in no acute distress. HEENT: Normocephalic and atraumatic. Conjunctivae are normal. No scleral icterus. Neck supple.  Cardiovascular: Normal rate, regular rhythm.  Pulmonary/chest: Effort normal and breath sounds normal.  Abdominal: Soft, nondistended, nontender.  There are no masses palpable. No hepatomegaly. Extremities: no edema Lymphadenopathy: No cervical adenopathy noted. Neurological: Alert and oriented to person place and time. Skin: Skin is warm and dry. No rashes noted. Psychiatric: Normal mood and affect. Behavior is normal.   ASSESSMENT AND PLAN: Chronic constipation / bloating / compression fractures - chronic symptoms but made worse by acute compression fractures in recent months. Following treatment of her fractures and constipation she is doing much better. I recommend she continue Miralax as needed. Can use linzess PRN if she wishes as well. In regards to colon cancer screening, given her comorbidities and age, I think risks of colonoscopy may outweigh benefits, she will have a very difficult time with prep and has declined further screening which is reasonable. Continue  present regimen, follow up PRN.  Vinco Cellar, MD [facility]

## 2018-02-09 NOTE — Assessment & Plan Note (Signed)
She has been tough to manage.  I have encouraged her to continue working with ID

## 2018-02-09 NOTE — Assessment & Plan Note (Signed)
She seems to be fairly stable.  Zithromax may be suppressing some aspect of chronic airway inflammation. Plan-okay to use Anoro a few days at a time it works that way for her.

## 2018-03-06 DIAGNOSIS — I10 Essential (primary) hypertension: Secondary | ICD-10-CM | POA: Diagnosis not present

## 2018-03-10 ENCOUNTER — Other Ambulatory Visit: Payer: Self-pay | Admitting: Infectious Disease

## 2018-03-13 ENCOUNTER — Telehealth: Payer: Self-pay | Admitting: Gastroenterology

## 2018-03-13 NOTE — Telephone Encounter (Signed)
Patient is notified. Explained how to take an enema, where to purchase an enema and what to expect if she has to use an enema.

## 2018-03-13 NOTE — Telephone Encounter (Signed)
Patient has not had a bowel movement since Tuesday. She has had one glass of Miralax daily for 3 days in a row. She has not done anything else for her constipation and asks for your recommendations.

## 2018-03-13 NOTE — Telephone Encounter (Signed)
Pt is concern about constipation has not gon since Monday and has been taking miralax. Would like some advise on something else that might help.

## 2018-03-13 NOTE — Telephone Encounter (Signed)
She should increase Miralax as needed. Take a double dose now and another double dose later today if needed. She should continue Miralax daily to prevent constipation moving forward. Can also try a fleet enema OTC if no result with higher dose Miralax. thanks

## 2018-03-14 NOTE — Telephone Encounter (Signed)
Pt called back with questions regarding enema and to report that she has pain below her ribs.  Please advise.

## 2018-03-14 NOTE — Telephone Encounter (Signed)
Advised of this.

## 2018-03-14 NOTE — Telephone Encounter (Signed)
Patient did use an enema yesterday with results. She has not had a bowel movement today. Today she will drink more Miralax. She reports she has an off and on pain under her rib cage. She does not know of a trigger. She cannot recreate it and "it passes quickly on its own." No nausea, vomiting or cramping. She will monitor for these things.

## 2018-03-14 NOTE — Telephone Encounter (Signed)
Thanks for the update. She should continue with the Miralax at least once daily if not double dose until having bowel movements daily. Thanks

## 2018-03-20 DIAGNOSIS — M542 Cervicalgia: Secondary | ICD-10-CM | POA: Diagnosis not present

## 2018-03-20 DIAGNOSIS — M533 Sacrococcygeal disorders, not elsewhere classified: Secondary | ICD-10-CM | POA: Diagnosis not present

## 2018-03-20 DIAGNOSIS — M545 Low back pain: Secondary | ICD-10-CM | POA: Diagnosis not present

## 2018-03-21 ENCOUNTER — Other Ambulatory Visit: Payer: Self-pay | Admitting: Orthopedic Surgery

## 2018-03-21 DIAGNOSIS — M545 Low back pain, unspecified: Secondary | ICD-10-CM

## 2018-03-27 ENCOUNTER — Telehealth: Payer: Self-pay

## 2018-03-27 NOTE — Telephone Encounter (Signed)
Called and spoke to pt about postponing her appt.  She is in a lot of pain and she would still like to come in.  She is hurting day and night and can not wait 4 weeks

## 2018-03-29 ENCOUNTER — Other Ambulatory Visit: Payer: Medicare Other

## 2018-03-31 ENCOUNTER — Ambulatory Visit: Payer: Medicare Other | Admitting: Gastroenterology

## 2018-04-02 ENCOUNTER — Telehealth: Payer: Self-pay | Admitting: Family Medicine

## 2018-04-02 ENCOUNTER — Ambulatory Visit: Payer: Medicare Other | Admitting: Infectious Disease

## 2018-04-02 DIAGNOSIS — M5416 Radiculopathy, lumbar region: Secondary | ICD-10-CM | POA: Diagnosis not present

## 2018-04-02 NOTE — Telephone Encounter (Signed)
Copied from [place] [phone]. Topic: Quick Communication - See Telephone Encounter >> Apr 02, 2018  1:10 PM Marin Olp L wrote: CRM for notification. See Telephone encounter for: 04/02/18. Patients husband Herbie [place] calling to request a transport chair due to patient's brittle bones? This was suggested by her neurosurgeon. Please advise.

## 2018-04-03 ENCOUNTER — Ambulatory Visit (INDEPENDENT_AMBULATORY_CARE_PROVIDER_SITE_OTHER): Payer: Medicare Other | Admitting: Pulmonary Disease

## 2018-04-03 ENCOUNTER — Encounter: Payer: Self-pay | Admitting: Pulmonary Disease

## 2018-04-03 ENCOUNTER — Other Ambulatory Visit: Payer: Self-pay

## 2018-04-03 ENCOUNTER — Ambulatory Visit: Payer: Medicare Other | Admitting: Internal Medicine

## 2018-04-03 DIAGNOSIS — J479 Bronchiectasis, uncomplicated: Secondary | ICD-10-CM

## 2018-04-03 DIAGNOSIS — A310 Pulmonary mycobacterial infection: Secondary | ICD-10-CM

## 2018-04-03 NOTE — Progress Notes (Signed)
Virtual Visit via Telephone Note  I connected with Mia Hernandez on 04/03/18 at 10:30 AM EDT by telephone and verified that I am speaking with the correct person using two identifiers.   I discussed the limitations, risks, security and privacy concerns of performing an evaluation and management service by telephone and the availability of in person appointments. I also discussed with the patient that there may be a patient responsible charge related to this service. The patient expressed understanding and agreed to proceed.   History of Present Illness:  78 year old female never smoker followed in our office for cough, bronchiectasis, MAC, obstructive airways disease.  Patient with a past medical history of myocarditis, cavitary lesion with history of hemoptysis resected right middle lobe and right lower lobe lobectomy in September/2016, right pleural effusion, and left breast cancer seeds.  Patient is followed in our office by Dr. Annamaria Boots.  Patient was contacted today for a telephonic office visit.  Time when patient was reached: 1031 Time when visit ended: 1055 Patient consented to consult via telephone: yes People present and their role in pt care: Pt   Chief complaint: Bronchiectasis and MAIC  Patient reports that she has been stable since last office visit.  She reports she still has a productive cough with yellow to green sputum.  She reports that this is been going on since prior to November/2019 office visit with Dr. Annamaria Boots as well as with infectious disease.  Patient has multiple allergies and intolerances.  Patient was unable to tolerate MAC therapies previously prescribed for.  She is unable to tolerate Anoro Ellipta.  She reports that she tried to use this but it caused keeps causing her to bleed.  She has not used this in over 3 months.  Patient has no acute symptoms such as fever, worsened fatigue, or hemoptysis.  Patient is unable to tolerate flutter valve or therapy vest per patient.    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 FVC 1.76/77%, FEV1 1.12/65%, ratio 0.63, FEF 25-75% 0.59/42%. Walk test 04/02/17 on room air-lowest saturation 92% Began zith/clofazimine/ Arikayce March, 2019.  Assessment and Plan:  Bronchiectasis [facility]) Assessment: Via telephone visit patient reports that symptoms have been stable since last office visit with Dr. Melida Quitter to tolerate Celedonio Miyamoto Patient reports she has not been using a flutter valve Still having productive cough with yellow to green mucus  Plan: Eat whole nutritious foods Encouraged her to consider trying flutter valve again when she comes back for office visit Follow-up in 8 weeks with Dr. 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 Annamaria Boots    Follow Up Instructions:  Follow up with Dr. Annamaria Boots 05/16/2018 - 38  Contact infectious disease regarding concerns of her symptoms    I discussed the assessment and treatment plan with the patient. The patient was provided an opportunity to ask questions and all were answered. The patient agreed with the plan and demonstrated an understanding of the instructions.   The patient was advised  to call back or seek an in-person evaluation if the symptoms worsen or if  the condition fails to improve as anticipated.  I provided 23 minutes of non-face-to-face time during this encounter.   Lauraine Rinne, NP

## 2018-04-03 NOTE — Patient Instructions (Addendum)
Contact Infectious Disease to schedule a follow up visit   Follow up with Dr. Annamaria Boots  >>> Friday, 05/16/18 1030  Bronchiectasis: This is the medical term which indicates that you have damage, dilated airways making you more susceptible to respiratory infection. Use a flutter valve 10 breaths twice a day or 4 to 5 breaths 4-5 times a day to help clear mucus out Let us know if you have cough with change in mucus color or fevers or chills.  At that point you would need an antibiotic. Maintain a healthy nutritious diet, eating whole foods Take your medications as prescribed     Coronavirus (COVID-19) Are you at risk?  Are you at risk for the Coronavirus (COVID-19)?  To be considered HIGH RISK for Coronavirus (COVID-19), you have to meet the following criteria:  . Traveled to [place], [place], [place], [place] or [place]; or in the [place] to [place], [place], [place], or [place]; and have fever, cough, and shortness of breath within the last 2 weeks of travel OR . Been in close contact with a person diagnosed with COVID-19 within the last 2 weeks and have fever, cough, and shortness of breath . IF YOU DO NOT MEET THESE CRITERIA, YOU ARE CONSIDERED LOW RISK FOR COVID-19.  What to do if you are HIGH RISK for COVID-19?  Marland Kitchen If you are having a medical emergency, call 911. . Seek medical care right away. Before you go to a doctor's office, urgent care or emergency department, call ahead and tell them about your recent travel, contact with someone diagnosed with COVID-19, and your symptoms. You should receive instructions from your physician's office regarding next steps of care.  . When you arrive at healthcare provider, tell the healthcare staff immediately you have returned from visiting [place], [place], [place], [place] or [place]; or traveled in the [place] to [place], [place], [place], or [place]; in the last two weeks or you have been in close contact with a person  diagnosed with COVID-19 in the last 2 weeks.   . Tell the health care staff about your symptoms: fever, cough and shortness of breath. . After you have been seen by a medical provider, you will be either: o Tested for (COVID-19) and discharged home on quarantine except to seek medical care if symptoms worsen, and asked to  - Stay home and avoid contact with others until you get your results (4-5 days)  - Avoid travel on public transportation if possible (such as bus, train, or airplane) or o Sent to the Emergency Department by EMS for evaluation, COVID-19 testing, and possible admission depending on your condition and test results.  What to do if you are LOW RISK for COVID-19?  Reduce your risk of any infection by using the same precautions used for avoiding the common cold or flu:  Marland Kitchen Wash your hands often with soap and warm water for at least 20 seconds.  If soap and water are not readily available, use an alcohol-based hand sanitizer with at least 60% alcohol.  . If coughing or sneezing, cover your mouth and nose by coughing or sneezing into the elbow areas of your shirt or coat, into a tissue or into your sleeve (not your hands). . Avoid shaking hands with others and consider head nods or verbal greetings only. . Avoid touching your eyes, nose, or mouth with unwashed hands.  . Avoid close contact with people who are sick. . Avoid places or events with  large numbers of people in one location, like concerts or sporting events. . Carefully consider travel plans you have or are making. . If you are planning any travel outside or inside the [place], visit the CDC's Travelers' Health webpage for the latest health notices. . If you have some symptoms but not all symptoms, continue to monitor at home and seek medical attention if your symptoms worsen. . If you are having a medical emergency, call 911.   [place] / e-Visit:  [email or web address]         [facility]: [facility]: [phone]                   [facility]: [phone]           It is flu season:   >>> Best ways to protect herself from the flu: Receive the yearly flu vaccine, practice good hand hygiene washing with soap and also using hand sanitizer when available, eat a nutritious meals, get adequate rest, hydrate appropriately   Please contact the office if your symptoms worsen or you have concerns that you are not improving.   Thank you for choosing [facility] for your healthcare, and for allowing [place] to partner with you on your healthcare journey. I am thankful to be able to provide care to you today.   Wyn Quaker FNP-C

## 2018-04-03 NOTE — Assessment & Plan Note (Signed)
Assessment: Difficult to manage this patient has tried multiple therapies Co-managed with infectious disease Patient has not followed up with infectious disease regarding concerns that she feels that her symptoms may be worsening When assessed today patient reports that she feels the same from November/2019 when she was last evaluated with infectious disease  Plan: Encourage patient to contact infectious disease with symptoms 8-week follow-up with Dr. Annamaria Boots

## 2018-04-03 NOTE — Assessment & Plan Note (Signed)
Assessment: Via telephone visit patient reports that symptoms have been stable since last office visit with Dr. Melida Quitter to tolerate Celedonio Miyamoto Patient reports she has not been using a flutter valve Still having productive cough with yellow to green mucus  Plan: Eat whole nutritious foods Encouraged her to consider trying flutter valve again when she comes back for office visit Follow-up in 8 weeks with Dr. Annamaria Boots

## 2018-04-03 NOTE — Telephone Encounter (Signed)
LM for call back to discuss with pt husband.

## 2018-04-03 NOTE — Telephone Encounter (Signed)
I have not seen pt since May and this would likely require a face to face visit within 30-90 days.  If she has seen Neurosurgery within that time frame, they need to order this for pt.  Otherwise, we would need to bring her in and that is not recommended with her underlying lung problems.

## 2018-04-04 ENCOUNTER — Telehealth: Payer: Self-pay | Admitting: Family Medicine

## 2018-04-04 NOTE — Telephone Encounter (Signed)
Patient's husband is going to call neurosurgery to see if they are able to order this.

## 2018-04-04 NOTE — Telephone Encounter (Signed)
See previous telephone encounter.

## 2018-04-04 NOTE — Telephone Encounter (Signed)
Copied from [place] #[id number]. Topic: Quick Communication - See Telephone Encounter >> Apr 04, 2018  1:31 PM Blase Mess A wrote: CRM for notification. See Telephone encounter for: 04/04/18.  Patient husband is calling to inquire about a transport chair.  Dr. Trenton Gammon at [facility] referred the patient back to Dr. Birdie Riddle. Please advise [phone]

## 2018-04-04 NOTE — Telephone Encounter (Signed)
LM for patient/husband to call.  If this is still a need, I will give instructions from PCP.

## 2018-04-08 ENCOUNTER — Other Ambulatory Visit: Payer: Self-pay

## 2018-04-08 ENCOUNTER — Ambulatory Visit
Admission: RE | Admit: 2018-04-08 | Discharge: 2018-04-08 | Disposition: A | Discharge status: Home / Self Care | Payer: Medicare Other | Source: Ambulatory Visit | Attending: Orthopedic Surgery | Admitting: Orthopedic Surgery

## 2018-04-08 DIAGNOSIS — M545 Low back pain, unspecified: Secondary | ICD-10-CM

## 2018-04-09 ENCOUNTER — Ambulatory Visit (INDEPENDENT_AMBULATORY_CARE_PROVIDER_SITE_OTHER): Payer: Medicare Other | Admitting: Gastroenterology

## 2018-04-09 DIAGNOSIS — G8929 Other chronic pain: Secondary | ICD-10-CM | POA: Diagnosis not present

## 2018-04-09 DIAGNOSIS — M545 Low back pain: Secondary | ICD-10-CM | POA: Diagnosis not present

## 2018-04-09 DIAGNOSIS — K5900 Constipation, unspecified: Secondary | ICD-10-CM | POA: Diagnosis not present

## 2018-04-09 DIAGNOSIS — R140 Abdominal distension (gaseous): Secondary | ICD-10-CM | POA: Diagnosis not present

## 2018-04-09 NOTE — Progress Notes (Signed)
Virtual Visit via Video Note  I connected with Mia Hernandez on 04/09/18 at  3:00 PM EDT by a video enabled telemedicine application and verified that I am speaking with the correct person using two identifiers.   I discussed the limitations of evaluation and management by telemedicine and the availability of in person appointments. The patient expressed understanding and agreed to proceed.   THIS ENCOUNTER IS A VIRTUAL VISIT DUE TO COVID-19 - PATIENT WAS NOT SEEN IN THE OFFICE. PATIENT HAS CONSENTED TO VIRTUAL VISIT / TELEMEDICINE VISIT   Location of patient: home Location of provider: office Name of referring provider:  Persons participating: myself, patient Time spent on call: 20 minutes   HPI :  78 year old female here for follow-up visit. She has a history of chronic bloating and chronic constipation, with comorbidities to include MAC / bronchiectasis, pleural effusion, and multiple compression fractures of her spine. She has ongoing back pain, recently had a follow up MRI of her lumbar spine and seeing pain management tomorrow for further care. She is on chronic Azithromycin for MAC.   She reports having worsening bloating, abdominal discomfort, and back pain since I've seen her.  Abdominal swelling / bloating is the main thing, can get better at night. Using hot packs which helps. Watching her diet. She does feel better with a BM. She is using Miralax daily. She is very uncomfortable in her lower back which she thinks is driving this issue. Symptoms worse than usual, she feels discomfort from her back radiating into her abdomen. She is having a bowel movement every day. No blood in the stools. She is taking Azithromycin daily.  She has had no benefit with several treatment options to include probiotics, simethicone, Beano, IB gard, low FODMAP diet.  I had recommended empiric therapy with rifaximin which she will not try due to severe allergy to rifabutin.  She has tested negative for  celiac disease.   MRI abdomen - 11/02/2016 - prominent stool burden, R pleural effusion small, bronchiectasis, mild intrahepatic dilation likely secondary to cholecystectomy, chronic compression fx Abdominal xray - 10/17/17 - large amount of stool in the right colon  Colonoscopy 01/29/2008 - diverticulosis   Past Medical History:  Diagnosis Date  . Acute left eye pain 11/15/2014  . Adverse effect of general anesthetic    DELERIUM  . ALLERGIC RHINITIS   . Anxiety   . Arthritis   . Bronchiectasis ([place])   . Cancer ([place])    basal cell skin cancer removed  . Chronic back pain   . Complication of anesthesia    s/p lung surgery at [place] X 2 MONTHS  . Diverticulosis   . Dysphagia 10/13/2014  . Hydropneumothorax 11/15/2014  . Hypertension   . Hyponatremia 09/15/2014  . Internal hemorrhoids   . Irritable bowel syndrome with constipation   . Mycobacterium avium-intracellulare infection ([place])   . 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 Osteoporosis   . Stroke ([place])    TIA  SEVERAL YEARS AGO X 1 (49YRS -30 YRS AGO)  . Subcutaneous emphysema ([place]) 10/13/2014  . Toe pain 11/15/2014     Past Surgical History:  Procedure Laterality Date  . BREAST BIOPSY    . BREAST LUMPECTOMY WITH RADIOACTIVE SEED LOCALIZATION Left 10/05/2015   Procedure: LEFT BREAST LUMPECTOMY WITH RADIOACTIVE SEED LOCALIZATION;  Surgeon: Excell Seltzer, MD;  Location: [facility];  Service: General;  Laterality: Left;  . CATARACT  EXTRACTION W/ INTRAOCULAR LENS  IMPLANT, BILATERAL    . CHOLECYSTECTOMY    . LAMINECTOMY     cervical   . VAGINAL HYSTERECTOMY     Family History  Problem Relation Age of Onset  . Breast cancer Mother   . Stroke Father   . Pulmonary embolism Brother    Social History   Tobacco Use  . Smoking status: Never Smoker  . Smokeless tobacco: Never Used  Substance Use Topics  . Alcohol use: No  . Drug  use: No   Current Outpatient Medications  Medication Sig Dispense Refill  . azithromycin (ZITHROMAX) 250 MG tablet TAKE 1 TABLET DAILY (MAC INFECTION) 90 tablet 4  . irbesartan (AVAPRO) 150 MG tablet Take 1 tablet (150 mg total) by mouth daily. 90 tablet 3  . polyethylene glycol (MIRALAX / GLYCOLAX) packet Take 17 g by mouth daily as needed.      No current facility-administered medications for this visit.    Allergies  Allergen Reactions  . Ethambutol Palpitations    Possible optic neuritis, possible gout attack  . Latex Rash  . Penicillins Hives    Breaks out in hives  . Prednisone Other (See Comments)     "out of mind" state  . Rifabutin Other (See Comments)    Loss of vision and anorexia  . Aspirin   . Betadine [Povidone Iodine]   . Codeine   . Doxycycline     faints  . Iodinated Diagnostic Agents   . 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 Morphine And Related   . Sulfa Antibiotics Other (See Comments)  . Azithromycin Other (See Comments)    Heart rhythm issues-prolonged QT wave     Review of Systems: All systems reviewed and negative except where noted in HPI.    Mr Lumbar Spine Wo Contrast  Result Date: 04/08/2018 CLINICAL DATA:  Diffuse mid and lower back pain. Severe since February 2020. EXAM: MRI LUMBAR SPINE WITHOUT CONTRAST TECHNIQUE: Multiplanar, multisequence MR imaging of the lumbar spine was performed. No intravenous contrast was administered. COMPARISON:  07/14/2016 FINDINGS: Segmentation:  Standard. Alignment:  Grade 1 anterolisthesis of L4 on L5. Vertebrae: No discitis or osteomyelitis. No aggressive osseous lesion. Acute T12 vertebral body compression fracture with marrow edema throughout the vertebral body and approximately 15% height loss. Acute L3 vertebral body compression fracture with approximately 50% central height loss and marrow edema within the vertebral body along the superior half. Acute L4 vertebral body compression  fracture with mild marrow edema along the superior endplate and less than 93% central height loss. Chronic L1 and L2 vertebral body compression fractures. Approximately 80% L1 vertebral body height loss. Approximately 40% central L2 vertebral body height loss. Conus medullaris and cauda equina: Conus extends to the L1 level. Conus and cauda equina appear normal. Paraspinal and other soft tissues: No acute paraspinal abnormality. Disc levels: Disc spaces: Degenerative disc disease with disc height loss at L4-5 and L5-S1. Disc desiccation throughout the lumbar spine. T12-L1: No significant disc bulge. Bilateral mild facet arthropathy. Mild left foraminal stenosis. No right foraminal stenosis. No central canal stenosis. L1-L2: No significant disc bulge. Moderate bilateral facet arthropathy. Moderate left foraminal stenosis. Mild right foraminal stenosis. No central canal stenosis. L2-L3: Mild broad-based disc bulge. Moderate bilateral facet arthropathy. Mild spinal stenosis. Mild bilateral foraminal stenosis. L3-L4: Broad-based disc bulge. Severe bilateral facet arthropathy with ligamentum flavum infolding. Moderate spinal stenosis. Moderate left foraminal stenosis. No significant right foraminal stenosis. L4-L5: Broad-based  disc bulge. Severe bilateral facet arthropathy. Mild bilateral foraminal stenosis. No central canal stenosis. L5-S1: Broad-based disc bulge. Mild bilateral facet arthropathy. Mild left foraminal stenosis. No central canal stenosis. IMPRESSION: 1. Acute T12 vertebral body compression fracture with marrow edema throughout the vertebral body and approximately 15% height loss. 2. Acute L3 vertebral body compression fracture with approximately 50% central height loss and marrow edema within the vertebral body along the superior half. 3. Acute L4 vertebral body compression fracture with mild marrow edema along the superior endplate and less than 46% central height loss. 4. Lumbar spine spondylosis as  described above. Electronically Signed   By: Kathreen Devoid   On: 04/08/2018 10:37   Lab Results  Component Value Date   WBC 9.3 08/02/2017   HGB 12.6 08/02/2017   HCT 37.4 08/02/2017   MCV 88.4 08/02/2017   PLT 451.0 (H) 08/02/2017    Lab Results  Component Value Date   CREATININE 0.46 08/02/2017   BUN 9 08/02/2017   NA 129 (L) 08/02/2017   K 4.1 08/02/2017   CL 90 (L) 08/02/2017   CO2 34 (H) 08/02/2017    Lab Results  Component Value Date   ALT 30 08/02/2017   AST 27 08/02/2017   ALKPHOS 69 08/02/2017   BILITOT 0.6 08/02/2017      Physical Exam: NA  ASSESSMENT AND PLAN:  78 y/o female here for reassessment of the following issues:  Bloating / constipation / back pain - chronic symptoms, I think made much worse by her compression fractures. She had a lumbar MRI done yesterday showing multiple acute compression fractures. She is seeing her pain management physician tomorrow to discuss options. I think a lot of her abdominal discomfort may be related to that. Prior MR imaging of her abdomen has not show pathology to cause her symptoms. She has been tried on an extensive variety of options as above. We considered Rifaximin but she has an allergy to Rifbutin and have held off. She is doing better on Miralax but has a lot of stool in her colon on multiple imaging studies, she can try increasing her Miralax to BID and see if she feels better with more frequent BMs. Otherwise recommend she see her pain management physician to address the compression fractures. If her symptoms persist despite management of that we can consider colonoscopy in light of ongoing symptoms and that her last exam was now > 10 years, however she is higher risk in light of COVID-19 outbreak and would hold off for now. She agreed. May consider it in the next few months if symptoms persist. She agreed and can contact us for scheduling at that time if she wants to proceed.  Chatsworth Cellar, MD [facility]  Gastroenterology

## 2018-04-10 ENCOUNTER — Telehealth: Payer: Self-pay

## 2018-04-10 DIAGNOSIS — S32030A Wedge compression fracture of third lumbar vertebra, initial encounter for closed fracture: Secondary | ICD-10-CM | POA: Diagnosis not present

## 2018-04-10 DIAGNOSIS — S32040A Wedge compression fracture of fourth lumbar vertebra, initial encounter for closed fracture: Secondary | ICD-10-CM | POA: Diagnosis not present

## 2018-04-10 DIAGNOSIS — S22080A Wedge compression fracture of T11-T12 vertebra, initial encounter for closed fracture: Secondary | ICD-10-CM | POA: Diagnosis not present

## 2018-04-10 NOTE — Telephone Encounter (Signed)
Added to waitlist for colon in the [facility]

## 2018-04-10 NOTE — Telephone Encounter (Signed)
-----   Message from Yetta Flock, MD sent at 04/09/2018  5:32 PM EDT ----- Regarding: RE: waitlist for the [facility] Colon sorry! ----- Message ----- From: Roetta Sessions, CMA Sent: 04/09/2018   4:31 PM EDT To: Yetta Flock, MD Subject: waitlist for the [facility]                           For a colon or EGD? Thanks!    ----- Message ----- From: Yetta Flock, MD Sent: 04/09/2018   3:55 PM EDT To: Roetta Sessions, CMA  Jan can you add to the wait list for [facility] procedures. Thanks

## 2018-04-14 ENCOUNTER — Encounter: Payer: Self-pay | Admitting: Family Medicine

## 2018-04-14 ENCOUNTER — Ambulatory Visit (INDEPENDENT_AMBULATORY_CARE_PROVIDER_SITE_OTHER): Payer: Medicare Other | Admitting: Family Medicine

## 2018-04-14 ENCOUNTER — Other Ambulatory Visit: Payer: Self-pay

## 2018-04-14 VITALS — BP 127/84 | HR 84 | Temp 96.5°F | Wt 92.4 lb

## 2018-04-14 DIAGNOSIS — M810 Age-related osteoporosis without current pathological fracture: Secondary | ICD-10-CM

## 2018-04-14 DIAGNOSIS — E46 Unspecified protein-calorie malnutrition: Secondary | ICD-10-CM | POA: Insufficient documentation

## 2018-04-14 DIAGNOSIS — M4850XA Collapsed vertebra, not elsewhere classified, site unspecified, initial encounter for fracture: Secondary | ICD-10-CM | POA: Insufficient documentation

## 2018-04-14 DIAGNOSIS — E440 Moderate protein-calorie malnutrition: Secondary | ICD-10-CM | POA: Diagnosis not present

## 2018-04-14 DIAGNOSIS — S32000G Wedge compression fracture of unspecified lumbar vertebra, subsequent encounter for fracture with delayed healing: Secondary | ICD-10-CM | POA: Diagnosis not present

## 2018-04-14 DIAGNOSIS — I10 Essential (primary) hypertension: Secondary | ICD-10-CM | POA: Diagnosis not present

## 2018-04-14 DIAGNOSIS — A310 Pulmonary mycobacterial infection: Secondary | ICD-10-CM | POA: Diagnosis not present

## 2018-04-14 DIAGNOSIS — M818 Other osteoporosis without current pathological fracture: Secondary | ICD-10-CM | POA: Diagnosis not present

## 2018-04-14 DIAGNOSIS — J449 Chronic obstructive pulmonary disease, unspecified: Secondary | ICD-10-CM

## 2018-04-14 NOTE — Progress Notes (Signed)
Virtual Visit via Video   I connected with Mia Hernandez on 04/14/18 at  2:00 PM EDT by a video enabled telemedicine application and verified that I am speaking with the correct person using two identifiers. Location patient: Home Location provider: Acupuncturist, Office Persons participating in the virtual visit: Pt and myself  I discussed the limitations of evaluation and management by telemedicine and the availability of in person appointments. The patient expressed understanding and agreed to proceed.  Subjective:   HPI:   Here today for MWV.  Risk Factors:  HTN- chronic problem, on Irbesartan 131m daily w/ good control.  Denies CP, HAs, visual changes, edema. COPD- chronic problem, following w/ pulmonary.  Not currently on any inhalers.  + SOB MAC infxn- chronic problem, following w/ pulmonary, on chronic Azithromycin.  Pt reports this is worsening and there are no other available txs.  + coughing and 'spitting up stuff'. Protein Calorie Malnutrition- ongoing issue.  BMI is 18.66  Pt reports she is eating regularly, 3 meals/day Vertebral fx- T12, L3/L4, plan is to do surgery but this is on hold for now given COVID situation.  Pt is struggling w/ pain. Physical Activity: walking 3x/week Fall Risk: low Depression: denies current depression Hearing: decreased to regular tones and whispered voice ADL's: independent Cognitive: normal linear thought process, memory and attention intact Home Safety: safe at home, lives w/ husband Height, Weight, BMI, Visual Acuity: see vitals, vision corrected to 20/20 w/ glasses Counseling: UTD on immunizations, mammo WNL last year (every other yr schedule) Health Care POA/Living Will: pt has both Labs Ordered: See A&P Care Plan: See A&P   ROS: See pertinent positives and negatives per HPI.  Patient Active Problem List   Diagnosis Date Noted  . Chronic obstructive pulmonary disease ([place] 04/14/2018  . IBS (irritable bowel syndrome)  04/04/2016  . Pleural effusion 02/16/2015  . 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 Dysphagia 10/13/2014  . Arthritis 09/15/2014  . Temporary cerebral vascular dysfunction 09/15/2014  . 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 QT prolongation 03/24/2014  . Asymptomatic LV dysfunction 11/11/2013  . Cardiac disease 11/11/2013  . Abnormal resting ECG findings 10/22/2013  . Abnormal ECG 10/22/2013  . Rhinitis 07/11/2013  . Chronic rhinitis 07/11/2013  . Essential (primary) hypertension 06/05/2012  . Mycobacterium avium-intracellulare infection ([place]   . Bronchiectasis ([place]   . Cough 04/22/2011    Social History   Tobacco Use  . Smoking status: Never Smoker  . Smokeless tobacco: Never Used  Substance Use Topics  . Alcohol use: No    Current Outpatient Medications:  .  azithromycin (ZITHROMAX) 250 MG tablet, TAKE 1 TABLET DAILY (MAC INFECTION), Disp: 90 tablet, Rfl: 4 .  irbesartan (AVAPRO) 150 MG tablet, Take 1 tablet (150 mg total) by mouth daily., Disp: 90 tablet, Rfl: 3 .  polyethylene glycol (MIRALAX / GLYCOLAX) packet, Take 17 g by mouth daily as needed. , Disp: , Rfl:   Allergies  Allergen Reactions  . Ethambutol Palpitations    Possible optic neuritis, possible gout attack  . Latex Rash  . Penicillins Hives    Breaks out in hives  . Prednisone Other (See Comments)     "out of mind" state  . Rifabutin Other (See Comments)    Loss of vision and anorexia  . Aspirin   . Betadine [Povidone Iodine]   . Codeine   . Doxycycline     faints  . Iodinated Diagnostic Agents   . Macrolides And  Ketolides     Myocarditis from zithromax  . Morphine And Related   . Sulfa Antibiotics Other (See Comments)  . Azithromycin Other (See Comments)    Heart rhythm issues-prolonged QT wave    Objective:   BP 127/84   Pulse 84   Temp (!) 96.5 F (35.8 C) (Oral)   Wt 92 lb 6.4 oz (41.9 kg)   BMI 18.66 kg/m  AAOx3, NAD NCAT, EOMI No obvious CN deficits  Coloring WNL Pt is able to speak clearly, coherently without shortness of breath or increased work of breathing.  Thought process is linear.  Mood is appropriate.   Assessment and Plan:   HTN- chronic problem.  Currently well controlled.  Declines lab visit at this time due to her underlying lung disease and current COVID situation.  Once things have calmed down she will reach out to schedule.  COPD- chronic problem.  Following w/ pulmonary.  Not currently on any inhalers.  Denies SOB at this time.  MAC infxn- remains on Azithro and pt reports she was told there is nothing else to do at this time.  She feels the infxn is worsening as her cough is worsening but is aware there is no tx.  Protein Calorie Malnutrition- ongoing issue.  Pt reports she is eating 3 meals every day.  Encouraged protein shakes as snacks.  Will continue to follow.  Vertebral fxs- new to provider, ongoing for pt.  She has seen neurosurg and they are recommending percutaneous kyphoplasty.  This is currently on hold given the COVID situation.  Pt is a high risk for any surgical procedure given her chronic lung disease.  Would need to be cleared by pulmonary.  Medicare Wellness Visit- pt is UTD on immunizations, due for mammogram (unable to schedule at this time due to radiology hold).  Following w/ GI and they are debating colonoscopy as they are not sure her lungs will be able to tolerate.  Anticipatory guidance provided.   Annye Asa, MD 04/14/2018

## 2018-04-14 NOTE — Progress Notes (Signed)
I have discussed the procedure for the virtual visit with the patient who has given consent to proceed with assessment and treatment.  ° °Peja Allender A Chynna Buerkle, CMA ° ° ° ° °

## 2018-04-17 ENCOUNTER — Ambulatory Visit: Payer: Medicare Other

## 2018-04-25 ENCOUNTER — Ambulatory Visit: Payer: Medicare Other | Admitting: Gastroenterology

## 2018-04-28 ENCOUNTER — Ambulatory Visit: Payer: Medicare Other | Admitting: Infectious Disease

## 2018-04-28 DIAGNOSIS — Y939 Activity, unspecified: Secondary | ICD-10-CM | POA: Diagnosis not present

## 2018-04-28 DIAGNOSIS — S22080A Wedge compression fracture of T11-T12 vertebra, initial encounter for closed fracture: Secondary | ICD-10-CM | POA: Diagnosis not present

## 2018-04-28 DIAGNOSIS — Y929 Unspecified place or not applicable: Secondary | ICD-10-CM | POA: Diagnosis not present

## 2018-04-28 DIAGNOSIS — X58XXXA Exposure to other specified factors, initial encounter: Secondary | ICD-10-CM | POA: Diagnosis not present

## 2018-04-28 DIAGNOSIS — S32040A Wedge compression fracture of fourth lumbar vertebra, initial encounter for closed fracture: Secondary | ICD-10-CM | POA: Diagnosis not present

## 2018-04-28 DIAGNOSIS — S32030A Wedge compression fracture of third lumbar vertebra, initial encounter for closed fracture: Secondary | ICD-10-CM | POA: Diagnosis not present

## 2018-04-28 DIAGNOSIS — Y999 Unspecified external cause status: Secondary | ICD-10-CM | POA: Diagnosis not present

## 2018-04-29 ENCOUNTER — Other Ambulatory Visit: Payer: Medicare Other

## 2018-05-08 ENCOUNTER — Ambulatory Visit (INDEPENDENT_AMBULATORY_CARE_PROVIDER_SITE_OTHER): Payer: Medicare Other | Admitting: Infectious Disease

## 2018-05-08 ENCOUNTER — Encounter: Payer: Self-pay | Admitting: Infectious Disease

## 2018-05-08 ENCOUNTER — Other Ambulatory Visit: Payer: Self-pay

## 2018-05-08 DIAGNOSIS — K582 Mixed irritable bowel syndrome: Secondary | ICD-10-CM | POA: Diagnosis not present

## 2018-05-08 DIAGNOSIS — A439 Nocardiosis, unspecified: Secondary | ICD-10-CM

## 2018-05-08 DIAGNOSIS — A310 Pulmonary mycobacterial infection: Secondary | ICD-10-CM

## 2018-05-08 DIAGNOSIS — J470 Bronchiectasis with acute lower respiratory infection: Secondary | ICD-10-CM | POA: Diagnosis not present

## 2018-05-08 DIAGNOSIS — M818 Other osteoporosis without current pathological fracture: Secondary | ICD-10-CM | POA: Diagnosis not present

## 2018-05-08 DIAGNOSIS — M810 Age-related osteoporosis without current pathological fracture: Secondary | ICD-10-CM

## 2018-05-08 NOTE — Progress Notes (Signed)
Virtual Visit via Telephone Note  I connected with Mia Hernandez on 05/08/18 at 10:45 AM EDT by telephone and verified that I am speaking with the correct person using two identifiers.  Location: Patient: Home Provider: [facility]   I discussed the limitations, risks, security and privacy concerns of performing an evaluation and management service by telephone and the availability of in person appointments. I also discussed with the patient that there may be a patient responsible charge related to this service. The patient expressed understanding and agreed to proceed.   History of Present Illness: 78 year old lady with past medical history significant for diagnosis of Mycobacterium avium infection of the lungs, ] Nocardia  INTERVAL HiSTORY:     Started on therapy with clarithromycin rifampin and ethambutol 3 days per week. She had great difficulty tolerating the Biaxin which was she was taking it twice daily doses on the day she was taking it was reduced to once daily dosing on July 01 2012 Apparently at that time there was concern about toxicity although she states that now that she was found to need simply new hearing aids. Initially while on therapy for Mycobacterium avium showed mprovement in her symptoms of chronic daily cough and sputum production.   In Fall of 2013 while [place] 2013 apparently she had episodes of hemoptysis and was hospitalized due to Hospital in [place] where she was placed in isolation for rule out tuberculosis. Ultimately was realized that she was suffering from Mycobacterium avium infection alone.  She had repeat sputum taken Spring of 2014 and again grow Mycobacterium avium. The organism was sent for susceptibility testing and showed macrolide sensitivity and in vitro synergy with rifampin and ETH in inhibiting growth in lab. Amikacin would also be considered active at S she had.  I had Changed her to  daily therapy with macrolide (Azithromycin) ethambutol and  rifampin. Since change she had been initiallycoughing less, she was gaining weight and was  absent fevers.   She saw Dr. Annamaria Boots in November 2014 when she reported 1 month she is coughing up more yellow/liquid phlem since on Rafampin. Chest x-ray was done and showed no changes. Sputum was sent for routine culture which only isolated normal oral pharyngeal flora. AFB culture is without AFB seen on smear blood cultures in November DID grow M avium again   I had tried to get her on daily RIFABUTIN with continue azithro and ETH but she did not tolerate at all with severe anorexia, and apparent visual problems, stating that "I could not see for several hours."  She went back to Rifampin TIW,  azithro 515m daily, ETH 7084mTU, TH< SA< SU  In the interim she was seen by her Cardiologist Dr. CrSallyanne Kusterwho saw her in October and found NEW  deep symmetrical inverted T waves in all the inferior leads as well as V3-V6. The QTC prolongation on EKG that persistd on followup EKG. he was concern for possible antibiotic induced myocarditis and the patient stopped her anti-Mycobacterium drugs and had MRI of her heart performed which was normal echocardiogram was also normal.  Repeat EKG done in the next few days showed resolution of T wave changes and improvement in QT  Since coming off her anti-Mycobacterium avium drugs she had continued to cough on a daily basis   She had CXR that showed expansion of cavitary disease though most recent CXR showed stable findings.   Note her M avium was R to Moxifloxacin with MIC of 4.  It was  I to linezolid with MIC of 16  It was S to clarithro with MIC of 2  Further MICs were:  Amikacin 16 Cipro 16 Ethambutol 8 Ethimodate > INH 8 Rifampin >8 Rifabutin 0.5 Streptomcin 32  Combination of ETH with clarithro, erythrom, rifampin, rifabutin all produced no growth in vitro  See below      She came to our clinic multiple times in  Spring 2016  with worsening cough, with blood tinged sputum and actual hemoptysis, fatigue, lack of energy.  I corresponded with Dr Lorenda Cahill via email and he recommended not starting any therapy until he had a chance to review her case himself when she comes to visit him. She is on schedule to see him in August.  She was worked into clinic before seeing Dr. Lorenda Cahill with  worsening blood tinged sputum and we ultimately decided upon consultation with Dr Lorenda Cahill and with Dr Sallyanne Kuster to re-challenge the patient with Azithro and [facility] which we did and there have been no EKG changes so far to suggest pericarditis , myocarditis or issues with QT prolongation.  Since she started on meds she has had improvement in sputum, no longer blood tinged and she is now able to sleep.  She was then seen  Dr Lorenda Cahill who collected sputum sample (though I cannot access it in [place]). Dr. Lorenda Cahill felt upon review of her films that her pulmonary disease was amenable to combined medical and surgical approach with resection of large cavitary areas from lungs to be considered by [facility]. Elenor Quinones at [facility].   In the interm she grew Nocardia species from sputum and has been started on zyvox which she was tolerating  But then developed nose blleeds.  She ultimately underwent surgery at [facility] on 09/27/14 with bronchoscopy, thoracoscopy with removal of 2 lobes of lung. She was changed from zyvox to rocephin (to which her Norardia was S) and has remained on this with plan of 6 weeks of IV abx. It is believed that the infected part of lung was removed with surgery. She also grew M avium from prior culture from Dr.Stout's office.  Her cough had DRAMATICALLY improved postoperatively but she did develop significant postoperative subcutaneous emphysema. She had resumed her azithromycin and ETH. She has had some dysphagia due to subcutaneous emphysema but this is improving.  A few visits since then she  developed NEW complaints of acute left toe pain, with  pain in her left eye and decreased visual acuity, along with malaise, nausea and reduced hearing.She stopped EThambutol and with our advise also the azithromycin to avoid monotherapy for her M avium.   WE had in the interim considered procuring clofazamine. Dr. Brigitte Pulse at [facility] corresponded with me and was very skeptical that the patients eye pain was due to [facility] and recommended rechallenge with [facility] at 69m/kg TIW along with continued Azithro first.   Unfortunately when she restarted those meds she began experiencing eye pain, joint pain and palpitations and [place] f [place].  She CLAIMED at one of her more recent visits that  [place] there was confirmation of the azithromycin causing her to have QT prolongation and myocarditis.  I see that she did have an EKG on January 06, 2016 which showed frequent PVCs but I do not see changes consistent with myocarditis.  Furthermore her QTC interval was similar in August 2018 when she was off of medications.   I had not seen her since that visit in the  December 2017.  In the interim she has had worsening weight loss which she partly attributes to IBS.  She also though has had worsening cough at times with hemoptysis.  Imaging including CT scan of the lungs was done recently which shows November 2018:  1. Progressive bronchiectasis involving the right lower lung with areas of cylindrical and cystic bronchiectasis. Patchy airspace nodules and endobronchial debris/ mucous plugging possibly related to a bronchiectasis related opportunistic infection. 2. Patchy areas of tree-in-bud appearance suggesting chronic inflammation or MAC. 3. No focal airspace consolidation, pulmonary edema or worrisome pulmonary mass. 4. Chronic right-sided pleural thickening and loculated fluid.  She is referred back to [place] by Dr. Annamaria Boots and she has yet again unsurprisingly growing Mycobacterium avium from her sputum cultures.   We  have worked to obtain inhaled amikacin + clofazimine and azithromycin to come up with a salvage regimen given her intolerances of different meds.  She has her INH amikacin and would like 3 month supply of azithro as is cheaper for her. We do NOT yet have clofazimine.  She has been continuing to have daily severe cough that brings up copious sputum and blood tinged material at times. She has lost weight , has poor appetite and frequent sore throat. She has had problems with dizziness as well. She has issues with bloating and  Urge to defecate and being worked up for IBS and being considered for endoscopy.  We have since been able to start her sequentially on azithromycin, clofazamine and more recently INH amikacin.  She noted that her hemoptysis stopped shortly after starting the macrolide. She also had EKG done with Dr. Sallyanne Kuster.  Which showed no abnormalities on EKG.  He states that when she started taking clofazimine it did change the color of her sputum.  She feels that since she started inhaled amikacin that the inhaled amikacin is causing her to cough more and she is at times not been taking it as she did yesterday.  She has a myriad of symptoms, many of which I cannot clearly attribute to her medications.  She continues to cough though it seems like that is better than before though now she thinks the amikacin is making her cough more and this certainly could be the case since it is inhaled medication.  She is going to go to pulmonary rehab per Dr. Janee Morn instructions.  She was  telling me that she is having trouble sleeping due to the coughing still she says that she has trouble thinking straight that she has trouble with dizziness.  She also has problems with nausea with abdominal pain.  She is having less loose bowel movements than before.  As her hearing was checked recently and has not changed.  With anything she complains of having profound fatigue and feeling  exhausted.  He had been on a regimen of azithromycin and clofazimine and inhaled amikacin.  However she began attributing multiple symptoms to the inhaled amikacin and clofazimine and stopped these medications against our advice.  She continued on azithromycin after restarting it when she developed hemoptysis.  She has stayed on this and despite having been seen by our nurse practitioner Terri Piedra. And recommended to STOP monotherapy for MAvium she continued on monotherapy.  Against our recommendation she continued on azithromycin monotherapy.  Since then she is been on and off the azithromycin I believe due to her anxiety about the fact that it might be efficacious against coronavirus 2019 or that the supply may run out.  She  had worsening cough with blood-streaked sputum off azithromycin but went back on azithromycin had less blood in her sputum but continues to have daily cough fits.  She is undergoing treatment for her osteoporosis at present is having difficult moving about.  She thinks she may need a bedside commode.  I suggested that she get in touch with primary care to help this be arranged.  I do think it is going to try to treat her M avium that she really should at least add back in the clofazimine that she was on.  She is adamantly against inhaled amikacin.      Observations/Objective:  Mycobacterium avium infection: Status post surgery with history of multiple intolerances of effective therapies for M avium.  I would like her to add back to clofazimine that she has at home to her azithromycin.  Irritable bowel syndrome: The past the clofazimine she claims worsened her IBS but I think given the extent of her coughing will be worth reinitiating this.  Osteoporosis: She has had several interventions of interventional radiology.    Assessment and Plan:  Mycobacterium avium: Reinitiate azithromycin and clofazimine  IBS: Continue to follow GI.  Osteoporosis follow-up  with primary and interventional radiology.  About coronavirus the 19 prevention: Critical that she avoid unnecessary healthcare encounters.   Follow Up Instructions:    I discussed the assessment and treatment plan with the patient. The patient was provided an opportunity to ask questions and all were answered. The patient agreed with the plan and demonstrated an understanding of the instructions.   The patient was advised to call back or seek an in-person evaluation if the symptoms worsen or if the condition fails to improve as anticipated.  I provided 21 minutes of non-face-to-face time during this encounter.   Alcide Evener, MD

## 2018-05-08 NOTE — Progress Notes (Signed)
The saga never ends [facility]

## 2018-05-15 DIAGNOSIS — S32040A Wedge compression fracture of fourth lumbar vertebra, initial encounter for closed fracture: Secondary | ICD-10-CM | POA: Diagnosis not present

## 2018-05-16 ENCOUNTER — Ambulatory Visit: Payer: Medicare Other | Admitting: Internal Medicine

## 2018-05-16 ENCOUNTER — Other Ambulatory Visit (HOSPITAL_COMMUNITY): Payer: Self-pay | Admitting: Neurosurgery

## 2018-05-16 ENCOUNTER — Other Ambulatory Visit: Payer: Self-pay | Admitting: Neurosurgery

## 2018-05-16 DIAGNOSIS — S32040D Wedge compression fracture of fourth lumbar vertebra, subsequent encounter for fracture with routine healing: Secondary | ICD-10-CM

## 2018-05-21 ENCOUNTER — Ambulatory Visit (HOSPITAL_COMMUNITY)
Admission: RE | Admit: 2018-05-21 | Discharge: 2018-05-21 | Disposition: A | Discharge status: Home / Self Care | Payer: Medicare Other | Source: Ambulatory Visit | Attending: Neurosurgery | Admitting: Neurosurgery

## 2018-05-21 ENCOUNTER — Encounter (HOSPITAL_COMMUNITY)
Admission: RE | Admit: 2018-05-21 | Discharge: 2018-05-21 | Disposition: A | Discharge status: Home / Self Care | Payer: Medicare Other | Source: Ambulatory Visit | Attending: Neurosurgery | Admitting: Neurosurgery

## 2018-05-21 ENCOUNTER — Other Ambulatory Visit: Payer: Self-pay

## 2018-05-21 DIAGNOSIS — S32040D Wedge compression fracture of fourth lumbar vertebra, subsequent encounter for fracture with routine healing: Secondary | ICD-10-CM | POA: Insufficient documentation

## 2018-05-21 DIAGNOSIS — R948 Abnormal results of function studies of other organs and systems: Secondary | ICD-10-CM | POA: Diagnosis not present

## 2018-05-21 MED ORDER — TECHNETIUM TC 99M MEDRONATE IV KIT
21.4000 | PACK | Freq: Once | INTRAVENOUS | Status: AC | PRN
  Administered 2018-05-21: 21.4 via INTRAVENOUS

## 2018-05-29 DIAGNOSIS — S32050A Wedge compression fracture of fifth lumbar vertebra, initial encounter for closed fracture: Secondary | ICD-10-CM | POA: Diagnosis not present

## 2018-05-29 DIAGNOSIS — S32040A Wedge compression fracture of fourth lumbar vertebra, initial encounter for closed fracture: Secondary | ICD-10-CM | POA: Diagnosis not present

## 2018-06-10 ENCOUNTER — Ambulatory Visit: Payer: Medicare Other | Admitting: Infectious Disease

## 2018-06-24 ENCOUNTER — Other Ambulatory Visit: Payer: Self-pay

## 2018-06-24 ENCOUNTER — Encounter: Payer: Self-pay | Admitting: Infectious Disease

## 2018-06-24 ENCOUNTER — Telehealth: Payer: Self-pay

## 2018-06-24 ENCOUNTER — Ambulatory Visit (INDEPENDENT_AMBULATORY_CARE_PROVIDER_SITE_OTHER): Payer: Medicare Other | Admitting: Infectious Disease

## 2018-06-24 ENCOUNTER — Telehealth: Payer: Self-pay | Admitting: Internal Medicine

## 2018-06-24 DIAGNOSIS — K589 Irritable bowel syndrome without diarrhea: Secondary | ICD-10-CM | POA: Diagnosis not present

## 2018-06-24 DIAGNOSIS — A310 Pulmonary mycobacterial infection: Secondary | ICD-10-CM | POA: Diagnosis not present

## 2018-06-24 DIAGNOSIS — J471 Bronchiectasis with (acute) exacerbation: Secondary | ICD-10-CM | POA: Diagnosis not present

## 2018-06-24 DIAGNOSIS — S12190D Other displaced fracture of second cervical vertebra, subsequent encounter for fracture with routine healing: Secondary | ICD-10-CM

## 2018-06-24 NOTE — Telephone Encounter (Signed)
[phone] pt husband returning call.Mia Hernandez

## 2018-06-24 NOTE — Telephone Encounter (Signed)
ATC Patient.  Left message for Patient to call back. 

## 2018-06-24 NOTE — Telephone Encounter (Signed)
Pt saw Aaron Edelman last here in the office but she is a Dr. Annamaria Boots pt. Spoke with pt, she has an appt on July 7th to follow up and to see if she would need anything before such as lab work or CT before she schedules the colonoscopy. CY please advise. She doesn't know if she should wait until her appt in July.   Assessment and Plan:  Bronchiectasis [facility]) Assessment: Via telephone visit patient reports that symptoms have been stable since last office visit with Dr. Melida Quitter to tolerate Celedonio Miyamoto Patient reports she has not been using a flutter valve Still having productive cough with yellow to green mucus  Plan: Eat whole nutritious foods Encouraged her to consider trying flutter valve again when she comes back for office visit Follow-up in 8 weeks with Dr. Annamaria Boots  Mycobacterium avium-intracellulare infection [facility]) Assessment: Difficult to manage this patient has tried multiple therapies Co-managed with infectious disease Patient has not followed up with infectious disease regarding concerns that she feels that her symptoms may be worsening When assessed today patient reports that she feels the same from November/2019 when she was last evaluated with infectious disease  Plan: Encourage patient to contact infectious disease with symptoms 8-week follow-up with Dr. Annamaria Boots

## 2018-06-24 NOTE — Progress Notes (Signed)
Virtual Visit via Telephone Note  I connected with Mia Hernandez on 06/24/18 at  8:45 AM EDT by telephone and verified that I am speaking with the correct person using two identifiers.  Location: Patient: Home Provider: [facility]   I discussed the limitations, risks, security and privacy concerns of performing an evaluation and management service by telephone and the availability of in person appointments. I also discussed with the patient that there may be a patient responsible charge related to this service. The patient expressed understanding and agreed to proceed.   History of Present Illness: 78 year old lady with past medical history significant for diagnosis of Mycobacterium avium infection of the lungs, ] Nocardia  INTERVAL HiSTORY:   She had on therapy with clarithromycin rifampin and ethambutol 3 days per week. She had great difficulty tolerating the Biaxin which was she was taking it twice daily doses on the day she was taking it was reduced to once daily dosing on July 01 2012 Apparently at that time there was concern about toxicity although she states that now that she was found to need simply new hearing aids. Initially while on therapy for Mycobacterium avium showed mprovement in her symptoms of chronic daily cough and sputum production.   In Fall of 2013 while [place] 2013 apparently she had episodes of hemoptysis and was hospitalized due to Hospital in [place] where she was placed in isolation for rule out tuberculosis. Ultimately was realized that she was suffering from Mycobacterium avium infection alone.  She had repeat sputum taken Spring of 2014 and again grow Mycobacterium avium. The organism was sent for susceptibility testing and showed macrolide sensitivity and in vitro synergy with rifampin and ETH in inhibiting growth in lab. Amikacin would also be considered active at S she had.  I had Changed her to  daily therapy with macrolide (Azithromycin) ethambutol and  rifampin. Since change she had been initiallycoughing less, she was gaining weight and was  absent fevers.   She saw Dr. Annamaria Boots in November 2014 when she reported 1 month she is coughing up more yellow/liquid phlem since on Rafampin. Chest x-ray was done and showed no changes. Sputum was sent for routine culture which only isolated normal oral pharyngeal flora. AFB culture is without AFB seen on smear blood cultures in November DID grow M avium again   I had tried to get her on daily RIFABUTIN with continue azithro and ETH but she did not tolerate at all with severe anorexia, and apparent visual problems, stating that "I could not see for several hours."  She went back to Rifampin TIW,  azithro 554m daily, ETH 708mTU, TH< SA< SU  In the interim she was seen by her Cardiologist Dr. CrSallyanne Kusterwho saw her in October and found NEW  deep symmetrical inverted T waves in all the inferior leads as well as V3-V6. The QTC prolongation on EKG that persistd on followup EKG. he was concern for possible antibiotic induced myocarditis and the patient stopped her anti-Mycobacterium drugs and had MRI of her heart performed which was normal echocardiogram was also normal.  Repeat EKG done in the next few days showed resolution of T wave changes and improvement in QT  Since coming off her anti-Mycobacterium avium drugs she had continued to cough on a daily basis   She had CXR that showed expansion of cavitary disease though most recent CXR showed stable findings.   Note her M avium was R to Moxifloxacin with MIC of 4.  It was  I to linezolid with MIC of 16  It was S to clarithro with MIC of 2  Further MICs were:  Amikacin 16 Cipro 16 Ethambutol 8 Ethimodate > INH 8 Rifampin >8 Rifabutin 0.5 Streptomcin 32  Combination of ETH with clarithro, erythrom, rifampin, rifabutin all produced no growth in vitro  See below      She came to our clinic multiple times in  Spring 2016  with worsening cough, with blood tinged sputum and actual hemoptysis, fatigue, lack of energy.  I corresponded with Dr Lorenda Cahill via email and he recommended not starting any therapy until he had a chance to review her case himself when she comes to visit him. She is on schedule to see him in August.  She was worked into clinic before seeing Dr. Lorenda Cahill with  worsening blood tinged sputum and we ultimately decided upon consultation with Dr Lorenda Cahill and with Dr Sallyanne Kuster to re-challenge the patient with Azithro and [facility] which we did and there have been no EKG changes so far to suggest pericarditis , myocarditis or issues with QT prolongation.  Since she started on meds she has had improvement in sputum, no longer blood tinged and she is now able to sleep.  She was then seen  Dr Lorenda Cahill who collected sputum sample (though I cannot access it in [place]). Dr. Lorenda Cahill felt upon review of her films that her pulmonary disease was amenable to combined medical and surgical approach with resection of large cavitary areas from lungs to be considered by Dr. Elenor Quinones at [facility].   In the interm she grew Nocardia species from sputum and has been started on zyvox which she was tolerating  But then developed nose blleeds.  She ultimately underwent surgery at [facility] on 09/27/14 with bronchoscopy, thoracoscopy with removal of 2 lobes of lung. She was changed from zyvox to rocephin (to which her Norardia was S) and has remained on this with plan of 6 weeks of IV abx. It is believed that the infected part of lung was removed with surgery. She also grew M avium from prior culture from Dr.Stout's office.  Her cough had DRAMATICALLY improved postoperatively but she did develop significant postoperative subcutaneous emphysema. She had resumed her azithromycin and ETH. She has had some dysphagia due to subcutaneous emphysema but this is improving.  A few visits since then she  developed NEW complaints of acute left toe pain, with  pain in her left eye and decreased visual acuity, along with malaise, nausea and reduced hearing.She stopped EThambutol and with our advise also the azithromycin to avoid monotherapy for her M avium.   WE had in the interim considered procuring clofazamine. Dr. Brigitte Pulse at [facility] corresponded with me and was very skeptical that the patients eye pain was due to [facility] and recommended rechallenge with [facility] at 69m/kg TIW along with continued Azithro first.   Unfortunately when she restarted those meds she began experiencing eye pain, joint pain and palpitations and [place] all f [place]s [place].  She CLAIMED at one of her more recent visits that  [place] there was confirmation of the azithromycin causing her to have QT prolongation and myocarditis.  I see that she did have an EKG on January 06, 2016 which showed frequent PVCs but I do not see changes consistent with myocarditis.  Furthermore her QTC interval was similar in August 2018 when she was off of medications.   I had not seen her since that visit in the  December 2017.  In the interim she has had worsening weight loss which she partly attributes to IBS.  She also though has had worsening cough at times with hemoptysis.  Imaging including CT scan of the lungs was done recently which shows November 2018:  1. Progressive bronchiectasis involving the right lower lung with areas of cylindrical and cystic bronchiectasis. Patchy airspace nodules and endobronchial debris/ mucous plugging possibly related to a bronchiectasis related opportunistic infection. 2. Patchy areas of tree-in-bud appearance suggesting chronic inflammation or MAC. 3. No focal airspace consolidation, pulmonary edema or worrisome pulmonary mass. 4. Chronic right-sided pleural thickening and loculated fluid.  She is referred back to [place] by Dr. Annamaria Boots and she has yet again unsurprisingly growing Mycobacterium avium from her sputum cultures.   We  have worked to obtain inhaled amikacin + clofazimine and azithromycin to come up with a salvage regimen given her intolerances of different meds.  She has her INH amikacin and would like 3 month supply of azithro as is cheaper for her. We do NOT yet have clofazimine.  She has been continuing to have daily severe cough that brings up copious sputum and blood tinged material at times. She has lost weight , has poor appetite and frequent sore throat. She has had problems with dizziness as well. She has issues with bloating and  Urge to defecate and being worked up for IBS and being considered for endoscopy.  We have since been able to start her sequentially on azithromycin, clofazamine and more recently INH amikacin.  She noted that her hemoptysis stopped shortly after starting the macrolide. She also had EKG done with Dr. Sallyanne Kuster.  Which showed no abnormalities on EKG.  He states that when she started taking clofazimine it did change the color of her sputum.  She feels that since she started inhaled amikacin that the inhaled amikacin is causing her to cough more and she is at times not been taking it as she did yesterday.  She has a myriad of symptoms, many of which I cannot clearly attribute to her medications.  She continues to cough though it seems like that is better than before though now she thinks the amikacin is making her cough more and this certainly could be the case since it is inhaled medication.  She is going to go to pulmonary rehab per Dr. Janee Morn instructions.  She was  telling me that she is having trouble sleeping due to the coughing still she says that she has trouble thinking straight that she has trouble with dizziness.  She also has problems with nausea with abdominal pain.  She is having less loose bowel movements than before.  As her hearing was checked recently and has not changed.  With anything she complains of having profound fatigue and feeling  exhausted.  He had been on a regimen of azithromycin and clofazimine and inhaled amikacin.  However she began attributing multiple symptoms to the inhaled amikacin and clofazimine and stopped these medications against our advice.  She continued on azithromycin after restarting it when she developed hemoptysis.  She has stayed on this and despite having been seen by our nurse practitioner Terri Piedra. And recommended to STOP monotherapy for MAvium she continued on monotherapy.  Against our recommendation she continued on azithromycin monotherapy.  Since then  When I last spoke to her via telephone she is been on and off the azithromycin I believe due to her anxiety about the fact that it might be efficacious against coronavirus 2019  or that the supply may run out.  She had worsening cough with blood-streaked sputum off azithromycin but went back on azithromycin had less blood in her sputum but continues to have daily cough fits.  She was undergoing treatment for her osteoporosis at present is having difficult moving about.  Since we last spoke we reinitiated azithromycin with clofazimine.  However she had difficulty again tolerating this was having more and more abdominal pain and stopped the clofazimine and continued azithromycin by itself.  He is still coughing every day.  She is due to see Dr. Annamaria Boots with pulmonary soon she is also supposed to undergo endoscopic evaluation if it is safe for her to do so with regards to her IBS.  I have counseled her that giving azithromycin monotherapy will not help in the long-term and will only potentiate macrolide resistance and her Mycobacterium avium.  She seems willing to try to reinitiate clofazimine with the azithromycin.  As has been detailed previously she has had multiple drug intolerances      Observations/Objective:  Mycobacterium avium infection: Status post surgery with history of multiple intolerances of effective therapies for M avium.  I  would like her to add back to clofazimine that she has at home to her azithromycin.  Irritable bowel syndrome: The past the clofazimine she claims worsened her IBS but I think given the extent of her coughing will be worth reinitiating this.  Osteoporosis: She has had several interventions of interventional radiology.    Assessment and Plan:  Mycobacterium avium: Reinitiate azithromycin and clofazimine  IBS: Continue to follow GI.  Osteoporosis follow-up with primary and interventional radiology.  About coronavirus the 19 prevention: Critical that she avoid unnecessary healthcare encounters.   Follow Up Instructions:    I discussed the assessment and treatment plan with the patient. The patient was provided an opportunity to ask questions and all were answered. The patient agreed with the plan and demonstrated an understanding of the instructions.   The patient was advised to call back or seek an in-person evaluation if the symptoms worsen or if the condition fails to improve as anticipated.  I provided 25 minutes of non-face-to-face time during this encounter.   Alcide Evener, MD

## 2018-06-24 NOTE — Telephone Encounter (Signed)
Called and spoke with pt's husband letting him know the information stated by CY that pt can go ahead and schedule colonoscopy or she could wait and discuss this with CY at upcoming appt 7/7. Pt's husband expressed understanding. Nothing further needed.

## 2018-06-24 NOTE — Telephone Encounter (Signed)
Left several phone messages and sent My Chart message for patient to call us reference scheduling a Colonoscopy or Phone visit with Dr. Havery Moros

## 2018-06-24 NOTE — Telephone Encounter (Signed)
Ok to wait and discuss at July appointment unless she feels there has been a change that concerns her.  Ok to go ahead with colonoscopy before the July appointment , if one is scheduled.

## 2018-06-25 ENCOUNTER — Telehealth: Payer: Self-pay | Admitting: Internal Medicine

## 2018-06-25 MED ORDER — LEVOFLOXACIN 500 MG PO TABS: 500.0000 mg | ORAL_TABLET | Freq: Every day | ORAL | 0 refills | Status: DC

## 2018-06-25 NOTE — Telephone Encounter (Signed)
Returned call to patient and informed of Dr. Janee Morn recommendation for levaquin.  Patient states she 'believes that is what she needs' and asked [place] to send in prescription to [facility] in [place].  Order placed.  Patient had no further questions.  Patient advised to contact us or report to ED if symptoms worsen or do not improve.  Encounter closed.

## 2018-06-25 NOTE — Telephone Encounter (Signed)
Called and spoke with pt who stated she has coughed up come bloody mucus a couple times. Pt stated it has happened when she does a big cough and within the clear mucus has some blood mixed in with it.  Pt denies any other complaints of SOB, fever, or chest tightness. Pt stated when she saw the blood mixed in with the mucus it scared her so that is why she called office. Pt wants recommendations to help with her symptoms. Dr. Annamaria Boots, please advise on this for pt. Thanks!  Allergies  Allergen Reactions  . Ethambutol Palpitations    Possible optic neuritis, possible gout attack  . Latex Rash  . Penicillins Hives    Breaks out in hives  . Prednisone Other (See Comments)     "out of mind" state  . Rifabutin Other (See Comments)    Loss of vision and anorexia  . Aspirin   . Betadine [Povidone Iodine]   . Codeine   . Doxycycline     faints  . Iodinated Diagnostic Agents   . 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 Morphine And Related   . Sulfa Antibiotics Other (See Comments)  . Azithromycin Other (See Comments)    Heart rhythm issues-prolonged QT wave    Current Outpatient Medications:  .  azithromycin (ZITHROMAX) 250 MG tablet, TAKE 1 TABLET DAILY (MAC INFECTION), Disp: 90 tablet, Rfl: 4 .  irbesartan (AVAPRO) 150 MG tablet, Take 1 tablet (150 mg total) by mouth daily., Disp: 90 tablet, Rfl: 3 .  polyethylene glycol (MIRALAX / GLYCOLAX) packet, Take 17 g by mouth daily as needed. , Disp: , Rfl:

## 2018-06-25 NOTE — Telephone Encounter (Signed)
Offer levaquin 500 mg, # 7, 1 daily. This is meant to clear any extra bronchitis that might be causing bleeding.

## 2018-06-26 ENCOUNTER — Telehealth: Payer: Self-pay | Admitting: Internal Medicine

## 2018-06-26 MED ORDER — DOXYCYCLINE HYCLATE 100 MG PO TABS: ORAL_TABLET | ORAL | 0 refills | Status: DC

## 2018-06-26 NOTE — Telephone Encounter (Signed)
Spoke with pt. She is aware of CY's response. Rx has been sent in. Doxy has been removed from the pt's allergy/intolerance list. Nothing further was needed.

## 2018-06-26 NOTE — Telephone Encounter (Signed)
Spoke with pt. States that yesterday she called in requesting an antibiotic. CY prescribed Levaquin, pt is not comfortable taking this. Reports having taken it in the past and it caused muscle pain and for her "bones to hurt." Pt would like to have Doxycyline sent in.  CY - please advise. Thanks.

## 2018-06-26 NOTE — Telephone Encounter (Signed)
Ok to d/c levaquin and order doxycycline 100 mg, # 8,    2 today then one daily  She has an intolerance listed to doxycycline. Please verify that we can remove that from her record.

## 2018-07-09 NOTE — Telephone Encounter (Signed)
LVM to call back to schedule a colonoscopy with Pre Visit for bloating/constipation

## 2018-07-15 ENCOUNTER — Other Ambulatory Visit: Payer: Self-pay

## 2018-07-15 ENCOUNTER — Ambulatory Visit (INDEPENDENT_AMBULATORY_CARE_PROVIDER_SITE_OTHER): Payer: Medicare Other | Admitting: Internal Medicine

## 2018-07-15 ENCOUNTER — Encounter: Payer: Self-pay | Admitting: Internal Medicine

## 2018-07-15 VITALS — BP 130/80 | HR 81 | Temp 98.4°F | Ht 59.0 in | Wt 91.0 lb

## 2018-07-15 DIAGNOSIS — E559 Vitamin D deficiency, unspecified: Secondary | ICD-10-CM

## 2018-07-15 DIAGNOSIS — A310 Pulmonary mycobacterial infection: Secondary | ICD-10-CM

## 2018-07-15 DIAGNOSIS — E039 Hypothyroidism, unspecified: Secondary | ICD-10-CM | POA: Diagnosis not present

## 2018-07-15 DIAGNOSIS — J471 Bronchiectasis with (acute) exacerbation: Secondary | ICD-10-CM

## 2018-07-15 DIAGNOSIS — E440 Moderate protein-calorie malnutrition: Secondary | ICD-10-CM | POA: Diagnosis not present

## 2018-07-15 LAB — CBC WITH DIFFERENTIAL/PLATELET
Basophils Absolute: 0.1 10*3/uL (ref 0.0–0.1)
Basophils Relative: 0.7 % (ref 0.0–3.0)
Eosinophils Absolute: 0.2 10*3/uL (ref 0.0–0.7)
Eosinophils Relative: 2.3 % (ref 0.0–5.0)
HCT: 37.5 % (ref 36.0–46.0)
Hemoglobin: 12.3 g/dL (ref 12.0–15.0)
Lymphocytes Relative: 10.2 % — ABNORMAL LOW (ref 12.0–46.0)
Lymphs Abs: 0.8 10*3/uL (ref 0.7–4.0)
MCHC: 32.9 g/dL (ref 30.0–36.0)
MCV: 87.2 fl (ref 78.0–100.0)
Monocytes Absolute: 0.8 10*3/uL (ref 0.1–1.0)
Monocytes Relative: 10.6 % (ref 3.0–12.0)
Neutro Abs: 5.9 10*3/uL (ref 1.4–7.7)
Neutrophils Relative %: 76.2 % (ref 43.0–77.0)
Platelets: 415 10*3/uL — ABNORMAL HIGH (ref 150.0–400.0)
RBC: 4.3 Mil/uL (ref 3.87–5.11)
RDW: 15.1 % (ref 11.5–15.5)
WBC: 7.7 10*3/uL (ref 4.0–10.5)

## 2018-07-15 LAB — TSH: TSH: 1.19 u[IU]/mL (ref 0.35–4.50)

## 2018-07-15 LAB — HEPATIC FUNCTION PANEL
ALT: 17 U/L (ref 0–35)
AST: 22 U/L (ref 0–37)
Albumin: 3.7 g/dL (ref 3.5–5.2)
Alkaline Phosphatase: 99 U/L (ref 39–117)
Bilirubin, Direct: 0.1 mg/dL (ref 0.0–0.3)
Total Bilirubin: 0.5 mg/dL (ref 0.2–1.2)
Total Protein: 7.5 g/dL (ref 6.0–8.3)

## 2018-07-15 LAB — BASIC METABOLIC PANEL
BUN: 10 mg/dL (ref 6–23)
CO2: 33 mEq/L — ABNORMAL HIGH (ref 19–32)
Calcium: 8.8 mg/dL (ref 8.4–10.5)
Chloride: 91 mEq/L — ABNORMAL LOW (ref 96–112)
Creatinine, Ser: 0.38 mg/dL — ABNORMAL LOW (ref 0.40–1.20)
GFR: 163.48 mL/min (ref >60.00)
Glucose, Bld: 95 mg/dL (ref 70–99)
Potassium: 4 mEq/L (ref 3.5–5.1)
Sodium: 130 mEq/L — ABNORMAL LOW (ref 135–145)

## 2018-07-15 LAB — LIPID PANEL
Cholesterol: 168 mg/dL (ref 0–200)
HDL: 73.8 mg/dL (ref >39.00)
LDL Cholesterol: 81 mg/dL (ref 0–99)
NonHDL: 94.1
Total CHOL/HDL Ratio: 2
Triglycerides: 67 mg/dL (ref 0.0–149.0)
VLDL: 13.4 mg/dL (ref 0.0–40.0)

## 2018-07-15 LAB — VITAMIN D 25 HYDROXY (VIT D DEFICIENCY, FRACTURES): VITD: 14 ng/mL — ABNORMAL LOW (ref 30.00–100.00)

## 2018-07-15 MED ORDER — BENZONATATE 200 MG PO CAPS: 200.0000 mg | ORAL_CAPSULE | Freq: Three times a day (TID) | ORAL | 3 refills | Status: DC | PRN

## 2018-07-15 NOTE — Progress Notes (Signed)
HPI female never smoker followed for cough, bronchiectasis/MAIC , obstructive airway disease, history of myocarditis. Cavitary lesion with history of hemoptysis resected/ RML/RLL lobectomy 09/2014 , right pleural effusion, left breast cancer/seeds  +MAIC/ triple therapy begun 06/18/11- dc'd 2015 by Cardiology due to myocarditis ? Due to zith?.   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 FVC 1.76/77%, FEV1 1.12/65%, ratio 0.63, FEF 25-75% 0.59/42%. Walk test 04/02/17 on room air-lowest saturation 92% Began zith/clofazimine/ Arikayce March, 2019. -----------------------------------------------------------------------------------------  12/03/2017- 78 year old female never smoker followed for cough, bronchiectasis, obstructive airway disease , history of myocarditis Cavitary lesion with history of hemoptysis resected, RML/RLL lobectomy 09/2014 for bronchiectasis, right pleural effusion, left breast cancer/seeds/partial mastect, IBS, hearing loss -----4 month follow up for bronchiectasis. States her breathing has been ok since her last visit.  She blames pulmonary rehab and her pneumatic vest for rib fractures.  Had thoracic spine surgery.  Chronic cough is little changed.  She is now only taking azithromycin, feeling that Arakayce combination therapy too many side effects.  She feels she is better with azithromycin than with nothing at all.  No recent hemoptysis.  Has Anoro at home but has not been using it. Indicates that IBS is a major quality of life problem, being addressed elsewhere. Sensorineural hearing loss being addressed at [facility]. CXR 08/02/2017- IMPRESSION: Postsurgical changes and changes on the right with changes of pleural-parenchymal scarring. Chronic interstitial  changes with changes of bronchiectasis, particular on the right are again noted. COPD. Chest is unchanged from prior exams  07/15/2018- 78 year old female never smoker followed for cough, bronchiectasis, obstructive airway disease , history of myocarditis Cavitary lesion with history of hemoptysis resected, RML/RLL lobectomy 09/2014 for bronchiectasis, right pleural effusion, left breast cancer/seeds/partial mastect, IBS, hearing loss She blamed pulmonary rehab and her pneumatic vest for rib fractures.  Had thoracic spine surgery.  Indicates that IBS is a major quality of life problem. Sensorineural hearing loss being addressed at [facility] of Ara[place]ce and blamed Anoro for repeated hemoptysis.  Sputum cx 08/07/17 again 3+ Pos MAIC -----pt states breathing has been worse; reports cough w/ thick yellow mucus & musculoskeletal pain  Continues Zithromax maintenance Cough remains productive yellow green, but without blood or fever.  Activity limited by osteoporotic fracture pain- esp spine. Pending f/u w NSGY for this. Cough mainly hurts her back.  Considered antibiotic and decided to wait. Doxy tolerated, stopped hemoptysis w/o affecting cough last time. She's afraid of quinolone impact on bones/ tendons and wants this saved as last resort.  She asks about getting pending labs drawn while here.  ROS- see HPI    + = positive Constitutional:   No-   weight loss, night sweats, fevers, chills, fatigue, lassitude. HEENT:   No-  headaches, difficulty swallowing, tooth/dental problems, sore throat,       No-  sneezing, itching, ear ache, nasal congestion, + post nasal drip,  CV:  + Tussive left rib pain residual from fracture, orthopnea, PND, swelling in lower extremities, anasarca, dizziness, palpitations Resp: +  shortness of breath with exertion or at rest.              +  productive cough,  little non-productive cough,  coughing up of blood.                change in color of mucus.  No- wheezing.    Skin:  No-   rash or lesions. GI:  No-   heartburn, indigestion, abdominal pain, nausea, vomiting, +IBS GU:  MS:  No-   joint pain or swelling.  + back pain Neuro-     nothing unusual Psych:  No- change in mood or affect. No depression or anxiety.  No memory loss.  OBJ- Physical Exam     General- Alert, Oriented, Affect-appropriate, Distress- none acute, +slender, +talkative Skin- rash-none, lesions- none, excoriation- none Lymphadenopathy- none Head- atraumatic            Eyes- Gross vision intact, PERRLA, conjunctivae and secretions clear            Ears- Hearing aides            Nose- Clear, no-Septal dev, mucus, polyps, erosion, perforation             Throat- Mallampati II , mucosa clear , drainage- none, tonsils- atrophic Neck- flexible , trachea midline, no stridor , thyroid nl, carotid no bruit Chest - symmetrical excursion , unlabored           Heart/CV- Reg pulse , no murmur , no gallop  , no rub, nl s1 s2                           - JVD- none , edema- none, stasis changes- none, varices- none           Lung- + distant sounds right base, few scattered rhonchi, cough-none , dullness+ right base, rub- none           Chest wall-  Abd-  Br/ Gen/ Rectal- Not done, not indicated Extrem- cyanosis- none, clubbing, none, atrophy- none, strength- nl Neuro- grossly intact to observation

## 2018-07-15 NOTE — Assessment & Plan Note (Signed)
Exacerbation.  Plan- CT chest for status update. She will retry benzonatate for some help controlling cough, but needs to continue pulmonary toilet.  Drawing labs while here to save travel.

## 2018-07-15 NOTE — Patient Instructions (Signed)
Order- labs- TSH, CBC w diff, BMET, hepatic function panel, lipid panel, Vit D- hydroxy                                      Dx hypothyroid, Vitamin D deficiency, Bronchiectasis exacerbation  Order- schedule CT chest high resolution, no contrast, dx MAIC bronchiectasis exacerbation  Script sent for benzonatate perles

## 2018-07-15 NOTE — Assessment & Plan Note (Signed)
Continues maint azithromycin after falling previous regimens including Arakayce, co-managed with Dr Lucianne Lei Dam/ ID. We have no curative therapy to offier.

## 2018-07-16 ENCOUNTER — Other Ambulatory Visit: Payer: Self-pay | Admitting: General Practice

## 2018-07-16 DIAGNOSIS — E871 Hypo-osmolality and hyponatremia: Secondary | ICD-10-CM

## 2018-07-16 MED ORDER — VITAMIN D (ERGOCALCIFEROL) 1.25 MG (50000 UNIT) PO CAPS: 50000.0000 [IU] | ORAL_CAPSULE | ORAL | 0 refills | Status: DC

## 2018-07-16 NOTE — Progress Notes (Signed)
Vit D level is low.  Based on this, we need to start 50,000 units weekly x12 weeks in addition to daily OTC supplement of at least 2000 units.  Also, sodium is mildly low.  Increase water intake and we'll repeat BMP at a lab only visit in 1-2 weeks

## 2018-07-16 NOTE — Progress Notes (Signed)
Pt notified of results. Vitamin d filled to local pharmacy and labs ordered and scheduled for 07/24/18

## 2018-07-17 DIAGNOSIS — S32050A Wedge compression fracture of fifth lumbar vertebra, initial encounter for closed fracture: Secondary | ICD-10-CM | POA: Diagnosis not present

## 2018-07-17 DIAGNOSIS — I10 Essential (primary) hypertension: Secondary | ICD-10-CM | POA: Diagnosis not present

## 2018-07-21 ENCOUNTER — Encounter: Payer: Self-pay | Admitting: Gastroenterology

## 2018-07-24 ENCOUNTER — Other Ambulatory Visit: Payer: Self-pay

## 2018-07-24 ENCOUNTER — Ambulatory Visit (INDEPENDENT_AMBULATORY_CARE_PROVIDER_SITE_OTHER): Payer: Medicare Other

## 2018-07-24 DIAGNOSIS — E871 Hypo-osmolality and hyponatremia: Secondary | ICD-10-CM

## 2018-07-24 LAB — BASIC METABOLIC PANEL
BUN: 10 mg/dL (ref 6–23)
CO2: 31 mEq/L (ref 19–32)
Calcium: 9.1 mg/dL (ref 8.4–10.5)
Chloride: 91 mEq/L — ABNORMAL LOW (ref 96–112)
Creatinine, Ser: 0.43 mg/dL (ref 0.40–1.20)
GFR: 141.74 mL/min (ref >60.00)
Glucose, Bld: 108 mg/dL — ABNORMAL HIGH (ref 70–99)
Potassium: 3.7 mEq/L (ref 3.5–5.1)
Sodium: 129 mEq/L — ABNORMAL LOW (ref 135–145)

## 2018-08-07 ENCOUNTER — Other Ambulatory Visit: Payer: Self-pay

## 2018-08-07 ENCOUNTER — Ambulatory Visit: Payer: Medicare Other | Admitting: *Deleted

## 2018-08-07 ENCOUNTER — Telehealth: Payer: Self-pay | Admitting: *Deleted

## 2018-08-07 VITALS — Ht 59.0 in | Wt 91.0 lb

## 2018-08-07 DIAGNOSIS — R140 Abdominal distension (gaseous): Secondary | ICD-10-CM

## 2018-08-07 DIAGNOSIS — Z1211 Encounter for screening for malignant neoplasm of colon: Secondary | ICD-10-CM

## 2018-08-07 DIAGNOSIS — K581 Irritable bowel syndrome with constipation: Secondary | ICD-10-CM

## 2018-08-07 MED ORDER — SUPREP BOWEL PREP KIT 17.5-3.13-1.6 GM/177ML PO SOLN: 1.0000 | Freq: Once | ORAL | 0 refills | Status: AC

## 2018-08-07 NOTE — Telephone Encounter (Signed)
John, This patient reports that she has a difficult airway. Patient questioned regarding documentation in History that she has complications of anesthesia. Patient stating her airway is small and curves. She states MDs have stressed this to her on occasions. Patient with osteoporosis and "cement injection"  for vertebral fxs. Patient uses a wheelchair and is able to transfer self.  This patient has [facility] and is very anxious regarding anesthesia. Please advise

## 2018-08-07 NOTE — Progress Notes (Signed)
Previsit via telephone r/t COVID pandemic. ID per name,dob and address  No egg or soy allergy known to patient  Only  issues with past sedation with any surgeries  or procedures related to slow to react after the anesthesia , Yet , patient stating she has a small airway.Patient stating her airway is a little to the right.patient stating MDs have emphasized this to her. Note sent to Osvaldo Angst CRNA to help evaluate.  No diet pills per patient No home 02 use per patient  No blood thinners per patient  Pt denies issues with constipation at present, more diarrhea. Patient knows to continue the Miralax the week of the procedure or if diarrhea stops. No A fib or A flutter  EMMI information,consent,acknowledgement form and stamped envelope for return, instructions mailed to the patient . Patient aware.

## 2018-08-07 NOTE — Telephone Encounter (Signed)
She has some underlying lung disease but no on oxygen. She did not mention these things to me during our clinic visit in regards to her airway. John not sure if you can see anything in her chart previously regarding anesthesia issues - all is see is difficulty swallowing after a procedure she had at [facility].

## 2018-08-08 NOTE — Telephone Encounter (Signed)
Santiago Glad and Dr. Havery Moros,  I have reviewed this pt's chart and discovered on 09/27/14 at [facility] she had an uneventful intubation with excellent visualization of her cords. I also called to reassure her but was only able to leave a message.  She is cleared for anesthetic care at [facility].  Thanks,  Osvaldo Angst

## 2018-08-08 NOTE — Telephone Encounter (Signed)
Thanks!   Sonnia Strong,LPN ( PV )

## 2018-08-14 ENCOUNTER — Ambulatory Visit (INDEPENDENT_AMBULATORY_CARE_PROVIDER_SITE_OTHER)
Admission: RE | Admit: 2018-08-14 | Discharge: 2018-08-14 | Disposition: A | Discharge status: Home / Self Care | Payer: Medicare Other | Source: Ambulatory Visit | Attending: Internal Medicine | Admitting: Internal Medicine

## 2018-08-14 ENCOUNTER — Other Ambulatory Visit: Payer: Self-pay

## 2018-08-14 DIAGNOSIS — J471 Bronchiectasis with (acute) exacerbation: Secondary | ICD-10-CM

## 2018-08-14 DIAGNOSIS — R0602 Shortness of breath: Secondary | ICD-10-CM | POA: Diagnosis not present

## 2018-08-18 ENCOUNTER — Telehealth: Payer: Self-pay | Admitting: Internal Medicine

## 2018-08-18 NOTE — Telephone Encounter (Signed)
Pt aware: Nothing further needed.   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  There is coronary artery calcification, but this study can't tell us if there is any blockage in arteries of the heart.

## 2018-08-20 ENCOUNTER — Telehealth: Payer: Self-pay | Admitting: Gastroenterology

## 2018-08-20 NOTE — Telephone Encounter (Signed)
Pt returned call and answered “No” to all questions.  °  °Pt made aware of that care partner may come to the lobby during the procedure but will need to provide their own mask. ° ° °

## 2018-08-20 NOTE — Telephone Encounter (Signed)

## 2018-08-21 ENCOUNTER — Encounter: Payer: Self-pay | Admitting: Gastroenterology

## 2018-08-21 ENCOUNTER — Other Ambulatory Visit: Payer: Self-pay

## 2018-08-21 ENCOUNTER — Encounter: Payer: Medicare Other | Admitting: Gastroenterology

## 2018-08-21 ENCOUNTER — Ambulatory Visit (AMBULATORY_SURGERY_CENTER): Payer: Medicare Other | Admitting: Gastroenterology

## 2018-08-21 VITALS — BP 147/76 | HR 68 | Temp 97.8°F | Resp 17 | Ht 59.0 in | Wt 91.0 lb

## 2018-08-21 DIAGNOSIS — D124 Benign neoplasm of descending colon: Secondary | ICD-10-CM | POA: Diagnosis not present

## 2018-08-21 DIAGNOSIS — D123 Benign neoplasm of transverse colon: Secondary | ICD-10-CM | POA: Diagnosis not present

## 2018-08-21 DIAGNOSIS — J449 Chronic obstructive pulmonary disease, unspecified: Secondary | ICD-10-CM | POA: Diagnosis not present

## 2018-08-21 DIAGNOSIS — D128 Benign neoplasm of rectum: Secondary | ICD-10-CM

## 2018-08-21 DIAGNOSIS — R140 Abdominal distension (gaseous): Secondary | ICD-10-CM

## 2018-08-21 DIAGNOSIS — D122 Benign neoplasm of ascending colon: Secondary | ICD-10-CM

## 2018-08-21 DIAGNOSIS — R1030 Lower abdominal pain, unspecified: Secondary | ICD-10-CM

## 2018-08-21 DIAGNOSIS — Z1211 Encounter for screening for malignant neoplasm of colon: Secondary | ICD-10-CM | POA: Diagnosis not present

## 2018-08-21 NOTE — Progress Notes (Signed)
Report given to PACU, vss 

## 2018-08-21 NOTE — Op Note (Signed)
[facility] Patient Name: Mia Hernandez Procedure Date: 08/21/2018 7:35 AM MRN: [id number] Endoscopist: Remo Lipps P. Havery Moros , MD Age: 78 Referring MD:  Date of Birth: 06-Dec-1940 Gender: Female Account #: [id number] Procedure:                Colonoscopy Indications:              Lower abdominal pain, bloating - persistent                            symptoms despite treatment of constipation, prior                            imaging negative, last colonoscopy > 10 years ago,                            chronic back pain / compression fractures lumbar                            spine Medicines:                Monitored Anesthesia Care Procedure:                Pre-Anesthesia Assessment:                           - Prior to the procedure, a History and Physical                            was performed, and patient medications and                            allergies were reviewed. The patient's tolerance of                            previous anesthesia was also reviewed. The risks                            and benefits of the procedure and the sedation                            options and risks were discussed with the patient.                            All questions were answered, and informed consent                            was obtained. Prior Anticoagulants: The patient has                            taken no previous anticoagulant or antiplatelet                            agents. ASA Grade Assessment: III - A patient with  severe systemic disease. After reviewing the risks                            and benefits, the patient was deemed in                            satisfactory condition to undergo the procedure.                           After obtaining informed consent, the colonoscope                            was passed under direct vision. Throughout the                            procedure, the patient's blood pressure, pulse, and                          oxygen saturations were monitored continuously. The                            Colonoscope was introduced through the anus and                            advanced to the the terminal ileum, with                            identification of the appendiceal orifice and IC                            valve. The colonoscopy was performed without                            difficulty. The patient tolerated the procedure                            well. The quality of the bowel preparation was                            good. The terminal ileum, ileocecal valve,                            appendiceal orifice, and rectum were photographed. Scope In: 5:97:41 AM Scope Out: 8:10:11 AM Scope Withdrawal Time: 0 hours 28 minutes 42 seconds  Total Procedure Duration: 0 hours 33 minutes 33 seconds  Findings:                 The perianal and digital rectal examinations were                            normal.                           The colon was extremely tortuous. The patient is  quite thin, case done with low air and mostly water                            immersion for cecal intubation.                           The terminal ileum appeared normal.                           Multiple medium-mouthed diverticula were found in                            the entire colon.                           A diminutive polyp was found in the ascending                            colon. The polyp was sessile. The polyp was removed                            with a cold snare. Resection and retrieval were                            complete.                           Two sessile polyps were found in the transverse                            colon. The polyps were 4 mm in size. These polyps                            were removed with a cold snare. Resection and                            retrieval were complete.                           A 3 mm polyp was found in the  descending colon. The                            polyp was sessile. The polyp was removed with a                            cold snare. Resection and retrieval were complete.                           A 8 to 10 mm polyp was found in the rectum. The                            polyp was flat and easily removed with a cold  snare. Resection and retrieval were complete. The                            area was observed for a few minutes post                            polypectomy and no bleeding noted.                           Internal hemorrhoids were found during retroflexion.                           The exam was otherwise without abnormality. Complications:            No immediate complications. Estimated blood loss:                            Minimal. Estimated Blood Loss:     Estimated blood loss was minimal. Impression:               - Tortuous colon.                           - The examined portion of the ileum was normal.                           - Diverticulosis in the entire examined colon.                           - One diminutive polyp in the ascending colon,                            removed with a cold snare. Resected and retrieved.                           - Two 4 mm polyps in the transverse colon, removed                            with a cold snare. Resected and retrieved.                           - One 3 mm polyp in the descending colon, removed                            with a cold snare. Resected and retrieved.                           - One 8 to 10 mm polyp in the rectum, removed with                            a cold snare. Resected and retrieved.                           - Internal hemorrhoids.                           -  The examination was otherwise normal.                           No cause for pain on this exam, which I suspect is                            most likely musculoskeletal / functional, perhaps                             related to chronic back pain / compression                            fractures. Recommendation:           - Patient has a contact number available for                            emergencies. The signs and symptoms of potential                            delayed complications were discussed with the                            patient. Return to normal activities tomorrow.                            Written discharge instructions were provided to the                            patient.                           - Resume previous diet.                           - Continue present medications.                           - Await pathology results. Remo Lipps P. Armbruster, MD 08/21/2018 8:19:33 AM This report has been signed electronically.

## 2018-08-21 NOTE — Patient Instructions (Signed)
YOU HAD AN ENDOSCOPIC PROCEDURE TODAY AT THE [facility]:   Refer to the procedure report that was given to you for any specific questions about what was found during the examination.  If the procedure report does not answer your questions, please call your gastroenterologist to clarify.  If you requested that your care partner not be given the details of your procedure findings, then the procedure report has been included in a sealed envelope for you to review at your convenience later.  YOU SHOULD EXPECT: Some feelings of bloating in the abdomen. Passage of more gas than usual.  Walking can help get rid of the air that was put into your GI tract during the procedure and reduce the bloating. If you had a lower endoscopy (such as a colonoscopy or flexible sigmoidoscopy) you may notice spotting of blood in your stool or on the toilet paper. If you underwent a bowel prep for your procedure, you may not have a normal bowel movement for a few days.  Please Note:  You might notice some irritation and congestion in your nose or some drainage.  This is from the oxygen used during your procedure.  There is no need for concern and it should clear up in a day or so.  SYMPTOMS TO REPORT IMMEDIATELY:   Following lower endoscopy (colonoscopy or flexible sigmoidoscopy):  Excessive amounts of blood in the stool  Significant tenderness or worsening of abdominal pains  Swelling of the abdomen that is new, acute  Fever of 100F or higher  For urgent or emergent issues, a gastroenterologist can be reached at any hour by calling [phone].   DIET:  We do recommend a small meal at first, but then you may proceed to your regular diet.  Drink plenty of fluids but you should avoid alcoholic beverages for 24 hours.  ACTIVITY:  You should plan to take it easy for the rest of today and you should NOT DRIVE or use heavy machinery until tomorrow (because of the sedation medicines used during the test).     FOLLOW UP: Our staff will call the number listed on your records 48-72 hours following your procedure to check on you and address any questions or concerns that you may have regarding the information given to you following your procedure. If we do not reach you, we will leave a message.  We will attempt to reach you two times.  During this call, we will ask if you have developed any symptoms of COVID 19. If you develop any symptoms (ie: fever, flu-like symptoms, shortness of breath, cough etc.) before then, please call [phone].  If you test positive for Covid 19 in the 2 weeks post procedure, please call and report this information to us.    If any biopsies were taken you will be contacted by phone or by letter within the next 1-3 weeks.  Please call us at [phone] if you have not heard about the biopsies in 3 weeks.    SIGNATURES/CONFIDENTIALITY: You and/or your care partner have signed paperwork which will be entered into your electronic medical record.  These signatures attest to the fact that that the information above on your After Visit Summary has been reviewed and is understood.  Full responsibility of the confidentiality of this discharge information lies with you and/or your care-partner. 

## 2018-08-21 NOTE — Progress Notes (Signed)
Called to room to assist during endoscopic procedure.  Patient ID and intended procedure confirmed with present staff. Received instructions for my participation in the procedure from the performing physician.  

## 2018-08-25 ENCOUNTER — Telehealth: Payer: Self-pay | Admitting: *Deleted

## 2018-08-25 ENCOUNTER — Telehealth: Payer: Self-pay

## 2018-08-25 NOTE — Telephone Encounter (Signed)
No answer, left message to call back later today, B.Noralee Dutko RN. 

## 2018-08-25 NOTE — Telephone Encounter (Signed)
  Follow up Call-  Call back number 08/21/2018  Post procedure Call Back phone  # [phone]  Permission to leave phone message Yes  Some recent data might be hidden     Patient questions:  Do you have a fever, pain , or abdominal swelling? No. Pain Score  0 *  Have you tolerated food without any problems? Yes.    Have you been able to return to your normal activities? Yes.    Do you have any questions about your discharge instructions: Diet   No. Medications  No. Follow up visit  No.  Do you have questions or concerns about your Care? No.  Actions: * If pain score is 4 or above: No action needed, pain <4.  1. Have you developed a fever since your procedure? NO  2.   Have you had an respiratory symptoms (SOB or cough) since your procedure? NO  3.   Have you tested positive for COVID 19 since your procedure NO  4.   Have you had any family members/close contacts diagnosed with the COVID 19 since your procedure?  NO   If yes to any of these questions please route to Joylene John, RN and Alphonsa Gin, RN.

## 2018-08-27 ENCOUNTER — Ambulatory Visit (INDEPENDENT_AMBULATORY_CARE_PROVIDER_SITE_OTHER): Payer: Medicare Other | Admitting: Infectious Disease

## 2018-08-27 ENCOUNTER — Encounter: Payer: Self-pay | Admitting: Infectious Disease

## 2018-08-27 ENCOUNTER — Other Ambulatory Visit: Payer: Self-pay

## 2018-08-27 DIAGNOSIS — S12190D Other displaced fracture of second cervical vertebra, subsequent encounter for fracture with routine healing: Secondary | ICD-10-CM | POA: Diagnosis not present

## 2018-08-27 DIAGNOSIS — J449 Chronic obstructive pulmonary disease, unspecified: Secondary | ICD-10-CM | POA: Diagnosis not present

## 2018-08-27 DIAGNOSIS — A310 Pulmonary mycobacterial infection: Secondary | ICD-10-CM

## 2018-08-27 DIAGNOSIS — J471 Bronchiectasis with (acute) exacerbation: Secondary | ICD-10-CM

## 2018-08-27 DIAGNOSIS — K589 Irritable bowel syndrome without diarrhea: Secondary | ICD-10-CM

## 2018-08-27 NOTE — Progress Notes (Signed)
Virtual Visit via Telephone Note  I connected with Manley Mason on 08/27/18 at 11:15 AM EDT by telephone and verified that I am speaking with the correct person using two identifiers.  Location: Patient: Home Provider: [facility]   I discussed the limitations, risks, security and privacy concerns of performing an evaluation and management service by telephone and the availability of in person appointments. I also discussed with the patient that there may be a patient responsible charge related to this service. The patient expressed understanding and agreed to proceed.   History of Present Illness: 78 year old lady with past medical history significant for diagnosis of Mycobacterium avium infection of the lungs, ] Nocardia  INTERVAL HiSTORY:   She had on therapy with clarithromycin rifampin and ethambutol 3 days per week. She had great difficulty tolerating the Biaxin which was she was taking it twice daily doses on the day she was taking it was reduced to once daily dosing on July 01 2012 Apparently at that time there was concern about toxicity although she states that now that she was found to need simply new hearing aids. Initially while on therapy for Mycobacterium avium showed mprovement in her symptoms of chronic daily cough and sputum production.   In Fall of 2013 while [place] 2013 apparently she had episodes of hemoptysis and was hospitalized due to Hospital in [place] where she was placed in isolation for rule out tuberculosis. Ultimately was realized that she was suffering from Mycobacterium avium infection alone.  She had repeat sputum taken Spring of 2014 and again grow Mycobacterium avium. The organism was sent for susceptibility testing and showed macrolide sensitivity and in vitro synergy with rifampin and ETH in inhibiting growth in lab. Amikacin would also be considered active at S she had.  I had Changed her to  daily therapy with macrolide (Azithromycin) ethambutol and  rifampin. Since change she had been initiallycoughing less, she was gaining weight and was  absent fevers.   She saw Dr. Annamaria Boots in November 2014 when she reported 1 month she is coughing up more yellow/liquid phlem since on Rafampin. Chest x-ray was done and showed no changes. Sputum was sent for routine culture which only isolated normal oral pharyngeal flora. AFB culture is without AFB seen on smear blood cultures in November DID grow M avium again   I had tried to get her on daily RIFABUTIN with continue azithro and ETH but she did not tolerate at all with severe anorexia, and apparent visual problems, stating that "I could not see for several hours."  She went back to Rifampin TIW,  azithro 550m daily, ETH 7029mTU, TH< SA< SU  In the interim she was seen by her Cardiologist Dr. CrSallyanne Kusterwho saw her in October and found NEW  deep symmetrical inverted T waves in all the inferior leads as well as V3-V6. The QTC prolongation on EKG that persistd on followup EKG. he was concern for possible antibiotic induced myocarditis and the patient stopped her anti-Mycobacterium drugs and had MRI of her heart performed which was normal echocardiogram was also normal.  Repeat EKG done in the next few days showed resolution of T wave changes and improvement in QT  Since coming off her anti-Mycobacterium avium drugs she had continued to cough on a daily basis   She had CXR that showed expansion of cavitary disease though most recent CXR showed stable findings.   Note her M avium was R to Moxifloxacin with MIC of 4.  It was I  to linezolid with MIC of 16  It was S to clarithro with MIC of 2  Further MICs were:  Amikacin 16 Cipro 16 Ethambutol 8 Ethimodate > INH 8 Rifampin >8 Rifabutin 0.5 Streptomcin 32  Combination of ETH with clarithro, erythrom, rifampin, rifabutin all produced no growth in vitro  See below      She came to our clinic multiple times in  Spring 2016  with worsening cough, with blood tinged sputum and actual hemoptysis, fatigue, lack of energy.  I corresponded with Dr Lorenda Cahill via email and he recommended not starting any therapy until he had a chance to review her case himself when she comes to visit him. She is on schedule to see him in August.  She was worked into clinic before seeing Dr. Lorenda Cahill with  worsening blood tinged sputum and we ultimately decided upon consultation with Dr Lorenda Cahill and with Dr Sallyanne Kuster to re-challenge the patient with Azithro and [facility] which we did and there have been no EKG changes so far to suggest pericarditis , myocarditis or issues with QT prolongation.  Since she started on meds she has had improvement in sputum, no longer blood tinged and she is now able to sleep.  She was then seen  Dr Lorenda Cahill who collected sputum sample (though I cannot access it in [place]). Dr. Lorenda Cahill felt upon review of her films that her pulmonary disease was amenable to combined medical and surgical approach with resection of large cavitary areas from lungs to be considered by [facility]. Elenor Quinones at [facility].   In the interm she grew Nocardia species from sputum and has been started on zyvox which she was tolerating  But then developed nose blleeds.  She ultimately underwent surgery at [facility] on 09/27/14 with bronchoscopy, thoracoscopy with removal of 2 lobes of lung. She was changed from zyvox to rocephin (to which her Norardia was S) and has remained on this with plan of 6 weeks of IV abx. It is believed that the infected part of lung was removed with surgery. She also grew M avium from prior culture from Dr.Stout's office.  Her cough had DRAMATICALLY improved postoperatively but she did develop significant postoperative subcutaneous emphysema. She had resumed her azithromycin and ETH. She has had some dysphagia due to subcutaneous emphysema but this is improving.  A few visits since then she  developed NEW complaints of acute left toe pain, with  pain in her left eye and decreased visual acuity, along with malaise, nausea and reduced hearing.She stopped EThambutol and with our advise also the azithromycin to avoid monotherapy for her M avium.   WE had in the interim considered procuring clofazamine. Dr. Brigitte Pulse at [facility] corresponded with me and was very skeptical that the patients eye pain was due to [facility] and recommended rechallenge with [facility] at 83m/kg TIW along with continued Azithro first.   Unfortunately when she restarted those meds she began experiencing eye pain, joint pain and palpitations and [place]  She CLAIMED at one of her more recent visits that  [id number] there was confirmation of the azithromycin causing her to have QT prolongation and myocarditis.  I see that she did have an EKG on January 06, 2016 which showed frequent PVCs but I do not see changes consistent with myocarditis.  Furthermore her QTC interval was similar in August 2018 when she was off of medications.   I had not seen her since that visit in the December  2017.  In the interim she has had worsening weight loss which she partly attributes to IBS.  She also though has had worsening cough at times with hemoptysis.  Imaging including CT scan of the lungs was done recently which shows November 2018:  1. Progressive bronchiectasis involving the right lower lung with areas of cylindrical and cystic bronchiectasis. Patchy airspace nodules and endobronchial debris/ mucous plugging possibly related to a bronchiectasis related opportunistic infection. 2. Patchy areas of tree-in-bud appearance suggesting chronic inflammation or MAC. 3. No focal airspace consolidation, pulmonary edema or worrisome pulmonary mass. 4. Chronic right-sided pleural thickening and loculated fluid.  She is referred back to [place] by Dr. Annamaria Boots and she has yet again unsurprisingly growing Mycobacterium avium from her sputum cultures.   We  have worked to obtain inhaled amikacin + clofazimine and azithromycin to come up with a salvage regimen given her intolerances of different meds.  She has her INH amikacin and would like 3 month supply of azithro as is cheaper for her. We do NOT yet have clofazimine.  She has been continuing to have daily severe cough that brings up copious sputum and blood tinged material at times. She has lost weight , has poor appetite and frequent sore throat. She has had problems with dizziness as well. She has issues with bloating and  Urge to defecate and being worked up for IBS and being considered for endoscopy.  We have since been able to start her sequentially on azithromycin, clofazamine and more recently INH amikacin.  She noted that her hemoptysis stopped shortly after starting the macrolide. She also had EKG done with Dr. Sallyanne Kuster.  Which showed no abnormalities on EKG.  He states that when she started taking clofazimine it did change the color of her sputum.  She feels that since she started inhaled amikacin that the inhaled amikacin is causing her to cough more and she is at times not been taking it as she did yesterday.  She has a myriad of symptoms, many of which I cannot clearly attribute to her medications.  She continues to cough though it seems like that is better than before though now she thinks the amikacin is making her cough more and this certainly could be the case since it is inhaled medication.  She is going to go to pulmonary rehab per Dr. Janee Morn instructions.  She was  telling me that she is having trouble sleeping due to the coughing still she says that she has trouble thinking straight that she has trouble with dizziness.  She also has problems with nausea with abdominal pain.  She is having less loose bowel movements than before.  As her hearing was checked recently and has not changed.  With anything she complains of having profound fatigue and feeling  exhausted.  He had been on a regimen of azithromycin and clofazimine and inhaled amikacin.  However she began attributing multiple symptoms to the inhaled amikacin and clofazimine and stopped these medications against our advice.  She continued on azithromycin after restarting it when she developed hemoptysis.  She has stayed on this and despite having been seen by our nurse practitioner Terri Piedra. And recommended to STOP monotherapy for MAvium she continued on monotherapy.  Against our recommendation she continued on azithromycin monotherapy.  Since then  When I last spoke to her via telephone she is been on and off the azithromycin I believe due to her anxiety about the fact that it might be efficacious against coronavirus 2019 or  that the supply may run out.  She had worsening cough with blood-streaked sputum off azithromycin but went back on azithromycin had less blood in her sputum but continues to have daily cough fits.  She was undergoing treatment for her osteoporosis at present is having difficult moving about.  Since we last spoke we reinitiated azithromycin with clofazimine.  However she had difficulty again tolerating this was having more and more abdominal pain and stopped the clofazimine and continued azithromycin by itself.  He is still coughing every day.  She is due to see Dr. Annamaria Boots with pulmonary soon she is also supposed to undergo endoscopic evaluation if it is safe for her to do so with regards to her IBS.  I have counseled her that giving azithromycin monotherapy will not help in the long-term and will only potentiate macrolide resistance and her Mycobacterium avium.  She was willing to try to reinitiate clofazimine with the azithromycin.  As has been detailed previously she has had multiple drug intolerances  However after restarting the clofazimine she had worsening GI symptoms and stopped it.  She has been on azithromycin monotherapy since then.  She has seen Dr. Annamaria Boots  ordered a CT scan a few weeks ago which showed the following:     1. 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 2. Small chronic right pleural effusion, minimally increased compared to the prior study. 3. Status post right lower and middle lobectomy. 4. Aortic atherosclerosis, in addition to left anterior descending coronary artery disease. Assessment for potential risk factor modification, dietary therapy or pharmacologic therapy may be warranted, if clinically indicated. 5. Additional incidental findings, as above.   Today when I talked her on the phone she actually had to have her husband do much of the communicating because she was in severe pain in her lower back.  She attributes this to having undergone recent colonoscopy which seems to have triggered muscle spasm and severe pain in lower back.  She is going to see Dr. Saintclair Halsted tomorrow.  Her cough frequency and volume has not worsened since I last talked to her.  She would like to continue on the azithromycin monotherapy  Observations/Objective:  Mycobacterium avium infection: Status post surgery with history of multiple intolerances of effective therapies for M avium.  I would like her to add back to clofazimine that she has at home to her azithromycin.  Irritable bowel syndrome: The past the clofazimine she claims worsened her IBS but I think given the extent of her coughing will be worth reinitiating this.  Osteoporosis: She has had several interventions of interventional radiology.    Assessment and Plan:  Mycobacterium avium: I think that ultimately azithromycin monotherapy is is going to be a recipe for resistance but at the present she would like to continue it  IBS: Continue to follow GI, colonoscopy where some polyps were found.  Osteoporosis follow-up with primary and interventional radiology Dr. Saintclair Halsted  tomorrow  Follow Up Instructions:    I discussed the assessment and treatment plan with the patient. The patient was provided an opportunity to ask questions and all were answered. The patient agreed with the plan and demonstrated an understanding of the instructions.   The patient was advised to call back or seek an in-person evaluation if the symptoms worsen or if the condition fails to improve as anticipated.  I provided 22 minutes of non-face-to-face time during this encounter.   Roderic Scarce  Tommy Medal, MD

## 2018-08-28 DIAGNOSIS — I10 Essential (primary) hypertension: Secondary | ICD-10-CM | POA: Diagnosis not present

## 2018-08-28 DIAGNOSIS — S22080A Wedge compression fracture of T11-T12 vertebra, initial encounter for closed fracture: Secondary | ICD-10-CM | POA: Diagnosis not present

## 2018-08-28 DIAGNOSIS — S32050A Wedge compression fracture of fifth lumbar vertebra, initial encounter for closed fracture: Secondary | ICD-10-CM | POA: Diagnosis not present

## 2018-08-28 DIAGNOSIS — S32010A Wedge compression fracture of first lumbar vertebra, initial encounter for closed fracture: Secondary | ICD-10-CM | POA: Diagnosis not present

## 2018-09-02 ENCOUNTER — Telehealth: Payer: Self-pay | Admitting: Internal Medicine

## 2018-09-02 MED ORDER — DOXYCYCLINE HYCLATE 100 MG PO TABS: 100.0000 mg | ORAL_TABLET | Freq: Two times a day (BID) | ORAL | 0 refills | Status: DC

## 2018-09-02 NOTE — Telephone Encounter (Signed)
Primary Pulmonologist: CY Last office visit and with whom: 07/15/2018 w/ CY What do we see them for (pulmonary problems): Hypothyroidism, Bronchiectasis, Vitamin D deficiency, Mycobacterium avium-intracellular infection   Reason for call: Pt spouse called stating pt is coughing up blood from her mcycobacterium avium infection. I spoke w/ pt to gather descriptors. Pt states she noticed a small amount mixed in with yellow-clear mucus this morning. She notes CY gave her doxycycline for this once before and it seems to work really well. Pt denies fever/chills/muscle aches. Pt states her wheezing and shortness of breath is as baseline, SpO2 is stable. Denies chest pain or chest tightness. She reports taking azithromycin 250 MG daily for her breathing (prescribed by Dr. Tommy Medal, Inf Disease provider). She would like to know if she can get a prescription for the doxycycline as prescribed by CY before.   In the last month, have you been in contact with someone who was confirmed or suspected to have Conoravirus / COVID-19?  No  Do you have any of the following symptoms developed in the last 30 days? Fever: No Cough: Occasional with clear-yellow mucus and small amounts of blood mixed in  Shortness of breath: No more than usual  When did your symptoms start?  This morning 09/02/2018  If the patient has a fever, what is the last reading?  (use n/a if patient denies fever)  N/A . IF THE PATIENT STATES THEY DO NOT OWN A THERMOMETER, THEY MUST GO AND PURCHASE ONE When did the fever start?: N/A Have you taken any medication to suppress a fever (ie Ibuprofen, Aleve, Tylenol)?: N/A  Since CY is not in the office, I am routing this message to the APP that saw her last, BPM. Aaron Edelman, please advise with your recommendations for this pt. Thank you.

## 2018-09-02 NOTE — Telephone Encounter (Signed)
Called and spoke w/ pt and pt spouse, Herbie [place] (on [place]) regarding Brian's results and recommendations. Pt and pt spouse verbalized understanding with no additional questions. Order for doxycycline 100 mg 1 tablet q12h for 7 days has been placed to pt's preferred pharmacy. Routing this message to [facility] as an FYI for when he returns to the clinic. Nothing further needed at this time.

## 2018-09-02 NOTE — Telephone Encounter (Signed)
Okay to offer:  Doxycycline >>> 1 100 mg tablet every 12 hours for 7 days >>>take with food  >>>wear sunscreen   Please place order  Patient needs to hold azithromycin while taking doxycycline.  She should not take them both together.  She also needs to notify infectious disease that she is starting to take doxycycline so they are aware.  If symptoms worsen or she starts to cough up more discolored mucus with blood in it then she needs to contact our office and have a chest x-ray performed.  Please also route this message to Dr. Annamaria Boots as an FYI for when he returns back to clinic so he is aware patient did have the symptoms.  And this is the plan moving forward.  Wyn Quaker, FNP

## 2018-09-04 ENCOUNTER — Telehealth: Payer: Self-pay | Admitting: *Deleted

## 2018-09-04 NOTE — Telephone Encounter (Signed)
Patient called in and states that she is having to sit a lot due to the pain that she is in and she has developed some redness on her backside. She states that there is not a "sore" but she does not want it to get to that point. She is asking if Dr. Birdie Riddle has any suggestions of what to do or put on this to help not get to the point that she has pressure sores.

## 2018-09-04 NOTE — Telephone Encounter (Signed)
She needs to regularly change positions- sitting to standing, lying on each side, etc.  Also, she should get either a butt donut or a seat cushion at a medical supply store to offload the pressure on her sacrum

## 2018-09-04 NOTE — Telephone Encounter (Signed)
Called and spoke with both pt and her husband and gave PCP recommendations. They both stated an understanding.

## 2018-09-18 DIAGNOSIS — S32050A Wedge compression fracture of fifth lumbar vertebra, initial encounter for closed fracture: Secondary | ICD-10-CM | POA: Diagnosis not present

## 2018-09-18 DIAGNOSIS — I10 Essential (primary) hypertension: Secondary | ICD-10-CM | POA: Diagnosis not present

## 2018-10-05 ENCOUNTER — Other Ambulatory Visit: Payer: Self-pay | Admitting: Cardiovascular Disease

## 2018-10-13 ENCOUNTER — Other Ambulatory Visit: Payer: Self-pay

## 2018-10-13 ENCOUNTER — Ambulatory Visit (INDEPENDENT_AMBULATORY_CARE_PROVIDER_SITE_OTHER): Payer: Medicare Other | Admitting: Family Medicine

## 2018-10-13 ENCOUNTER — Encounter: Payer: Self-pay | Admitting: Family Medicine

## 2018-10-13 VITALS — BP 140/90 | HR 81 | Temp 99.1°F | Resp 17 | Ht 59.0 in | Wt 89.2 lb

## 2018-10-13 DIAGNOSIS — I10 Essential (primary) hypertension: Secondary | ICD-10-CM | POA: Diagnosis not present

## 2018-10-13 DIAGNOSIS — M810 Age-related osteoporosis without current pathological fracture: Secondary | ICD-10-CM | POA: Diagnosis not present

## 2018-10-13 DIAGNOSIS — E440 Moderate protein-calorie malnutrition: Secondary | ICD-10-CM

## 2018-10-13 DIAGNOSIS — Z23 Encounter for immunization: Secondary | ICD-10-CM

## 2018-10-13 LAB — VITAMIN D 25 HYDROXY (VIT D DEFICIENCY, FRACTURES): VITD: 59.46 ng/mL (ref 30.00–100.00)

## 2018-10-13 LAB — BASIC METABOLIC PANEL
BUN: 8 mg/dL (ref 6–23)
CO2: 31 mEq/L (ref 19–32)
Calcium: 9.1 mg/dL (ref 8.4–10.5)
Chloride: 88 mEq/L — ABNORMAL LOW (ref 96–112)
Creatinine, Ser: 0.34 mg/dL — ABNORMAL LOW (ref 0.40–1.20)
GFR: 185.75 mL/min (ref >60.00)
Glucose, Bld: 92 mg/dL (ref 70–99)
Potassium: 3.7 mEq/L (ref 3.5–5.1)
Sodium: 128 mEq/L — ABNORMAL LOW (ref 135–145)

## 2018-10-13 NOTE — Progress Notes (Signed)
   Subjective:    Patient ID: Mia Hernandez, female    DOB: 31-Aug-1940, 78 y.o.   MRN: [id number]  HPI HTN- chronic problem, on irbesartan 15mg  daily.  BP is mildly elevated.  Denies CP.  Chronic SOB, cough.  Osteoporosis- chronic problem, seeing Dr Trenton Gammon for compression fx.  Neurosurgeon is recommending she start something- pt wants him to handle it, neurosurg wants me to handle it.  Pt has completed high dose Vit D.  Last Vit D level 14.  Protein calorie malnutrition- pt feels her appetite has 'been good'.  Reports she is eating regularly- 3 meals/day w/ some snacks in between.  Review of Systems For ROS see HPI     Objective:   Physical Exam Vitals signs reviewed.  Constitutional:      General: She is not in acute distress.    Appearance: She is well-developed.     Comments: Frail, almost cachectic in appearance  HENT:     Head: Normocephalic and atraumatic.  Eyes:     Conjunctiva/sclera: Conjunctivae normal.     Pupils: Pupils are equal, round, and reactive to light.  Neck:     Musculoskeletal: Normal range of motion and neck supple.     Thyroid: No thyromegaly.  Cardiovascular:     Rate and Rhythm: Normal rate and regular rhythm.     Heart sounds: Normal heart sounds. No murmur.  Pulmonary:     Effort: Pulmonary effort is normal. No respiratory distress.     Breath sounds: Rhonchi (over L upper and middle lobes, better air movement in LLL) present.     Comments: Absent breath sounds over RML and RLL Abdominal:     General: There is no distension.     Palpations: Abdomen is soft.  Lymphadenopathy:     Cervical: No cervical adenopathy.  Skin:    General: Skin is warm and dry.  Neurological:     Mental Status: She is alert and oriented to person, place, and time.  Psychiatric:        Behavior: Behavior normal.           Assessment & Plan:

## 2018-10-13 NOTE — Assessment & Plan Note (Signed)
Ongoing issue for pt.  She states that her appetite has returned and she is again eating.  Husband is preparing all meals and ensures she gets 3 meals/day w/ snacks.  She is not interested in protein shakes.  Will continue to follow.

## 2018-10-13 NOTE — Assessment & Plan Note (Signed)
Again discussed with pt and husband the need to take something to treat her osteoporosis.  She prefers the Neurosurgery handle this but husband indicates Neurosurg prefers that I handle it.  She is not willing to do anything until her f/u appt w/ them.  Encouraged her to call me when she wants to start treatment.  Repeat Vit D level and replete prn.

## 2018-10-13 NOTE — Patient Instructions (Signed)
Follow up in 6 months to recheck BP We'll notify you of your lab results and make any changes if needed Make sure you continue to eat regularly- at least 3 meals/day with snacks Please start something for your osteoporosis Call with any questions or concerns Hang in there! Stay Safe!!!

## 2018-10-13 NOTE — Assessment & Plan Note (Signed)
Chronic problem.  Slightly above goal but given age and comorbidities I do not want to make medication changes at this time.  Will continue to follow.  Pt expressed understanding and is in agreement w/ plan.

## 2018-10-16 ENCOUNTER — Other Ambulatory Visit: Payer: Self-pay | Admitting: Family Medicine

## 2018-10-16 DIAGNOSIS — E871 Hypo-osmolality and hyponatremia: Secondary | ICD-10-CM

## 2018-10-27 ENCOUNTER — Telehealth: Payer: Self-pay

## 2018-10-27 NOTE — Telephone Encounter (Signed)
Left message for patient to please call back. 

## 2018-10-27 NOTE — Telephone Encounter (Signed)
Called patient and gave her Dr. Doyne Keel comments. She said she has been using miralax and her bowels are ok. Scheduled her for an office visit with Dr Havery Moros on 11/27/18.

## 2018-10-28 ENCOUNTER — Ambulatory Visit (INDEPENDENT_AMBULATORY_CARE_PROVIDER_SITE_OTHER): Payer: Medicare Other | Admitting: Infectious Disease

## 2018-10-28 ENCOUNTER — Encounter: Payer: Self-pay | Admitting: Infectious Disease

## 2018-10-28 ENCOUNTER — Other Ambulatory Visit: Payer: Self-pay

## 2018-10-28 DIAGNOSIS — A310 Pulmonary mycobacterial infection: Secondary | ICD-10-CM

## 2018-10-28 DIAGNOSIS — J471 Bronchiectasis with (acute) exacerbation: Secondary | ICD-10-CM

## 2018-10-28 NOTE — Progress Notes (Signed)
Virtual Visit via Telephone Note  I connected with Mia Hernandez on 10/28/18 at  9:45 AM EDT by telephone and verified that I am speaking with the correct person using two identifiers.  Location: Patient: Home Provider: [facility]   I discussed the limitations, risks, security and privacy concerns of performing an evaluation and management service by telephone and the availability of in person appointments. I also discussed with the patient that there may be a patient responsible charge related to this service. The patient expressed understanding and agreed to proceed.  Complaint: Worsening cough throughout the day  History of Present Illness: 78year-old lady with past medical history significant for diagnosis of Mycobacterium avium infection of the lungs, ] Nocardia  INTERVAL HiSTORY:   She had on therapy with clarithromycin rifampin and ethambutol 3 days per week. She had great difficulty tolerating the Biaxin which was she was taking it twice daily doses on the day she was taking it was reduced to once daily dosing on July 01 2012 Apparently at that time there was concern about toxicity although she states that now that she was found to need simply new hearing aids. Initially while on therapy for Mycobacterium avium showed mprovement in her symptoms of chronic daily cough and sputum production.   In Fall of 2013 while [place] 2013 apparently she had episodes of hemoptysis and was hospitalized due to Hospital in [place] where she was placed in isolation for rule out tuberculosis. Ultimately was realized that she was suffering from Mycobacterium avium infection alone.  She had repeat sputum taken Spring of 2014 and again grow Mycobacterium avium. The organism was sent for susceptibility testing and showed macrolide sensitivity and in vitro synergy with rifampin and ETH in inhibiting growth in lab. Amikacin would also be considered active at S she had.  I had Changed her to  daily therapy with  macrolide (Azithromycin) ethambutol and rifampin. Since change she had been initiallycoughing less, she was gaining weight and was  absent fevers.   She saw Dr. Annamaria Boots in November 2014 when she reported 1 month she is coughing up more yellow/liquid phlem since on Rafampin. Chest x-ray was done and showed no changes. Sputum was sent for routine culture which only isolated normal oral pharyngeal flora. AFB culture is without AFB seen on smear blood cultures in November DID grow M avium again   I had tried to get her on daily RIFABUTIN with continue azithro and ETH but she did not tolerate at all with severe anorexia, and apparent visual problems, stating that "I could not see for several hours."  She went back to Rifampin TIW,  azithro 522m daily, ETH 707mTU, TH< SA< SU  In the interim she was seen by her Cardiologist Dr. CrSallyanne Kusterwho saw her in October and found NEW  deep symmetrical inverted T waves in all the inferior leads as well as V3-V6. The QTC prolongation on EKG that persistd on followup EKG. he was concern for possible antibiotic induced myocarditis and the patient stopped her anti-Mycobacterium drugs and had MRI of her heart performed which was normal echocardiogram was also normal.  Repeat EKG done in the next few days showed resolution of T wave changes and improvement in QT  Since coming off her anti-Mycobacterium avium drugs she had continued to cough on a daily basis   She had CXR that showed expansion of cavitary disease though most recent CXR showed stable findings.   Note her M avium was R to Moxifloxacin with  MIC of 4.  It was I to linezolid with MIC of 16  It was S to clarithro with MIC of 2  Further MICs were:  Amikacin 16 Cipro 16 Ethambutol 8 Ethimodate > INH 8 Rifampin >8 Rifabutin 0.5 Streptomcin 32  Combination of ETH with clarithro, erythrom, rifampin, rifabutin all produced no growth in vitro  See below      She came to our  clinic multiple times in  Spring 2016 with worsening cough, with blood tinged sputum and actual hemoptysis, fatigue, lack of energy.  I corresponded with Dr Lorenda Cahill via email and he recommended not starting any therapy until he had a chance to review her case himself when she comes to visit him. She is on schedule to see him in August.  She was worked into clinic before seeing Dr. Lorenda Cahill with  worsening blood tinged sputum and we ultimately decided upon consultation with Dr Lorenda Cahill and with Dr Sallyanne Kuster to re-challenge the patient with Azithro and [facility] which we did and there have been no EKG changes so far to suggest pericarditis , myocarditis or issues with QT prolongation.  Since she started on meds she has had improvement in sputum, no longer blood tinged and she is now able to sleep.  She was then seen  Dr Lorenda Cahill who collected sputum sample (though I cannot access it in [place]). Dr. Lorenda Cahill felt upon review of her films that her pulmonary disease was amenable to combined medical and surgical approach with resection of large cavitary areas from lungs to be considered by Dr. Elenor Quinones at [facility].   In the interm she grew Nocardia species from sputum and has been started on zyvox which she was tolerating  But then developed nose blleeds.  She ultimately underwent surgery at [facility] on 09/27/14 with bronchoscopy, thoracoscopy with removal of 2 lobes of lung. She was changed from zyvox to rocephin (to which her Norardia was S) and has remained on this with plan of 6 weeks of IV abx. It is believed that the infected part of lung was removed with surgery. She also grew M avium from prior culture from Dr.Stout's office.  Her cough had DRAMATICALLY improved postoperatively but she did develop significant postoperative subcutaneous emphysema. She had resumed her azithromycin and ETH. She has had some dysphagia due to subcutaneous emphysema but this is improving.  A few visits since then she  developed NEW  complaints of acute left toe pain, with pain in her left eye and decreased visual acuity, along with malaise, nausea and reduced hearing.She stopped EThambutol and with our advise also the azithromycin to avoid monotherapy for her M avium.   WE had in the interim considered procuring clofazamine. Dr. Brigitte Pulse at [facility] corresponded with me and was very skeptical that the patients eye pain was due to [facility] and recommended rechallenge with [facility] at 25m/kg TIW along with continued Azithro first.   Unfortunately when she restarted those meds she began experiencing eye pain, joint pain and palpitations and stopped all f these meds again in [place] 2017.  She CLAIMED at one of her more recent visits that  IRodeo2019 there was confirmation of the azithromycin causing her to have QT prolongation and myocarditis.  I see that she did have an EKG on January 06, 2016 which showed frequent PVCs but I do not see changes consistent with myocarditis.  Furthermore her QTC interval was similar in August 2018 when she was off of medications.   I had not seen  her since that visit in the December 2017.  In the interim she has had worsening weight loss which she partly attributes to IBS.  She also though has had worsening cough at times with hemoptysis.  Imaging including CT scan of the lungs was done recently which shows November 2018:  1. Progressive bronchiectasis involving the right lower lung with areas of cylindrical and cystic bronchiectasis. Patchy airspace nodules and endobronchial debris/ mucous plugging possibly related to a bronchiectasis related opportunistic infection. 2. Patchy areas of tree-in-bud appearance suggesting chronic inflammation or MAC. 3. No focal airspace consolidation, pulmonary edema or worrisome pulmonary mass. 4. Chronic right-sided pleural thickening and loculated fluid.  She is referred back to [place] by Dr. Annamaria Boots and she has yet again unsurprisingly growing Mycobacterium  avium from her sputum cultures.   We had worked to obtain inhaled amikacin + clofazimine and azithromycin to come up with a salvage regimen given her intolerances of different meds.  She has her INH amikacin and would like 3 month supply of azithro as is cheaper for her. We do NOT yet have clofazimine.  She had been continuing to have daily severe cough that brings up copious sputum and blood tinged material at times. She had lost weight , has poor appetite and frequent sore throat. She has had problems with dizziness as well. She has issues with bloating and  Urge to defecate and being worked up for IBS and being considered for endoscopy.   In the interval we then started her sequentially on azithromycin, clofazamine and more recently INH amikacin.  She noted that her hemoptysis stopped shortly after starting the macrolide. She also had EKG done with Dr. Sallyanne Kuster.  Which showed no abnormalities on EKG.  She  Stated that when she started taking clofazimine it did change the color of her sputum.  She feels that since she started inhaled amikacin that the inhaled amikacin is causing her to cough more and she is at times not been taking it as she did yesterday.  She has a myriad of symptoms, many of which I could not clearly attribute to her medications.  She continued to cough though it seems like that is better than before though now she thinks the amikacin is making her cough more and this certainly could be the case since it is inhaled medication.  She is going to go to pulmonary rehab per Dr. Janee Morn instructions.  She was  telling me that she is having trouble sleeping due to the coughing still she says that she has trouble thinking straight that she has trouble with dizziness.  She also has problems with nausea with abdominal pain.  She is having less loose bowel movements than before.  As her hearing was checked recently and has not changed.  With anything she  complains of having profound fatigue and feeling exhausted.  She had been on a regimen of azithromycin and clofazimine and inhaled amikacin.  However she AGAIN began attributing multiple symptoms to the inhaled amikacin and clofazimine and stopped these medications against our advice.  She continued on azithromycin after restarting it when she developed hemoptysis.  She has stayed on this and despite having been seen by our nurse practitioner Terri Piedra. And recommended to STOP monotherapy for MAvium she continued on monotherapy.  Against our recommendation she continued on azithromycin monotherapy.  Since then  When I last spoke to her via telephone she is been on and off the azithromycin I believe due to her anxiety about the  fact that it might be efficacious against coronavirus 2019 or that the supply may run out.  She had worsening cough with blood-streaked sputum off azithromycin but went back on azithromycin had less blood in her sputum but continues to have daily cough fits.  She was undergoing treatment for her osteoporosis at present is having difficult moving about.  At one of her last visits we reinitiated azithromycin with clofazimine.  However she had difficulty again tolerating this was having more and more abdominal pain and stopped the clofazimine and continued azithromycin by itself.  She is still coughing every day.     I have counseled her that giving azithromycin monotherapy will not help in the long-term and will only potentiate macrolide resistance and her Mycobacterium avium.  She was willing to try to reinitiate clofazimine with the azithromycin.  As has been detailed previously she has had multiple drug intolerances  However after restarting the clofazimine again she had worsening GI symptoms and stopped it.  She has been on azithromycin monotherapy since then.  She has seen Dr. Annamaria Boots ordered a CT scan a few weeks ago which showed the following:     1. Worsening  areas of bronchiectasis and mucoid impaction, as above, compatible with the reported clinical history of chronic atypical mycobacterial infection. 2. Small chronic right pleural effusion, minimally increased compared to the prior study. 3. Status post right lower and middle lobectomy. 4. Aortic atherosclerosis, in addition to left anterior descending coronary artery disease. Assessment for potential risk factor modification, dietary therapy or pharmacologic therapy may be warranted, if clinically indicated. 5. Additional incidental findings, as above.   At last visit when I talked her on the phone she actually had to have her husband do much of the communicating because she was in severe pain in her lower back.  She attributes this to having undergone recent colonoscopy which seems to have triggered muscle spasm and severe pain in lower back.  She is going to see Dr. Saintclair Halsted tomorrow.  Her cough frequency and volume had not worsened since I last talked to her.    Today when I talked to her however she says her coughing is much much worse than it was before despite taking the azithromycin.  I am concerned that she now has macrolide resistance.  I have asked her to come collect a sputum kit to submit an AFB culture.  I am also scheduling her to see me in roughly 6 weeks times I would look at susceptibility patterns.  Observations/Objective:  Mycobacterium avium infection: Probably is acquiring macrolide resistance now which is going to compromise our ability to treat her at all.  We will get sputum and send for susceptibility testing and then tried to reconvene with her and propose a regimen but all of the drugs that we have been using to treat her she has had either genuine intolerances or perceived intolerances of these medications.    Irritable bowel syndrome: The past the clofazimine she claims worsened her IBS but I think given the extent of her coughing will be worth reinitiating this.   Osteoporosis: She has had several interventions of interventional radiology.    Assessment and Plan:  Mycobacterium avium: Above we will obtain AFB culture from her and sent for susceptibilities and I will see her back in 6 weeks time  IBS: Continue to follow GI, colonoscopy where some polyps were found.  Osteoporosis follow-up with primary and interventional radiology Dr. Saintclair Halsted tomorrow  Follow Up Instructions:  I discussed the assessment and treatment plan with the patient. The patient was provided an opportunity to ask questions and all were answered. The patient agreed with the plan and demonstrated an understanding of the instructions.   The patient was advised to call back or seek an in-person evaluation if the symptoms worsen or if the condition fails to improve as anticipated.  I provided 15 minutes of non-face-to-face time during this encounter.   Alcide Evener, MD

## 2018-10-30 DIAGNOSIS — I10 Essential (primary) hypertension: Secondary | ICD-10-CM | POA: Diagnosis not present

## 2018-10-30 DIAGNOSIS — S32050A Wedge compression fracture of fifth lumbar vertebra, initial encounter for closed fracture: Secondary | ICD-10-CM | POA: Diagnosis not present

## 2018-11-05 ENCOUNTER — Other Ambulatory Visit: Payer: Self-pay

## 2018-11-05 ENCOUNTER — Other Ambulatory Visit: Payer: Medicare Other

## 2018-11-05 DIAGNOSIS — A310 Pulmonary mycobacterial infection: Secondary | ICD-10-CM

## 2018-11-10 ENCOUNTER — Telehealth: Payer: Self-pay

## 2018-11-10 NOTE — Telephone Encounter (Signed)
MYCOBACTERIA, CULTURE, WITH FLUOROCHROME SMEAR Status:  Preliminary result  Dx:  Mycobacterium avium-intracellulare  Specimen Information: Sputum     Component 5d ago  MICRO NUMBER: [id number] P   SPECIMEN QUALITY: Adequate P   Source: SPUTUM P   STATUS: PRELIMINARY P   SMEAR: Many (4 +) acid-fast bacilli seen using the fluorochrome method.Abnormal  P   RESULT: Culture results to follow. Final reports of negative cultures can be expected in approximately six weeks. Positive cultures are reported immediately.         Eugenia Mcalpine

## 2018-11-10 NOTE — Telephone Encounter (Signed)
This is NOT surprising since she is on INEFFECTIVE AZITHROMYCIN monotherapy which we told her would fail her

## 2018-11-17 ENCOUNTER — Ambulatory Visit (INDEPENDENT_AMBULATORY_CARE_PROVIDER_SITE_OTHER): Payer: Medicare Other

## 2018-11-17 ENCOUNTER — Ambulatory Visit (INDEPENDENT_AMBULATORY_CARE_PROVIDER_SITE_OTHER): Payer: Medicare Other | Admitting: Internal Medicine

## 2018-11-17 ENCOUNTER — Other Ambulatory Visit: Payer: Self-pay

## 2018-11-17 ENCOUNTER — Encounter: Payer: Self-pay | Admitting: Internal Medicine

## 2018-11-17 VITALS — BP 160/110 | HR 92 | Temp 98.0°F | Ht 59.0 in | Wt 84.2 lb

## 2018-11-17 DIAGNOSIS — J471 Bronchiectasis with (acute) exacerbation: Secondary | ICD-10-CM

## 2018-11-17 DIAGNOSIS — A310 Pulmonary mycobacterial infection: Secondary | ICD-10-CM

## 2018-11-17 DIAGNOSIS — J449 Chronic obstructive pulmonary disease, unspecified: Secondary | ICD-10-CM

## 2018-11-17 MED ORDER — BREZTRI AEROSPHERE 160-9-4.8 MCG/ACT IN AERO: 2.0000 | INHALATION_SPRAY | Freq: Two times a day (BID) | RESPIRATORY_TRACT | 0 refills | Status: AC

## 2018-11-17 NOTE — Progress Notes (Signed)
HPI female never smoker followed for cough, bronchiectasis/MAIC , obstructive airway disease, history of myocarditis. Cavitary lesion with history of hemoptysis resected/ RML/RLL lobectomy 09/2014 , right pleural effusion, left breast cancer/seeds  +MAIC/ triple therapy begun 06/18/11- dc'd 2015 by Cardiology due to myocarditis ? Due to zith?.   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 FVC 1.76/77%, FEV1 1.12/65%, ratio 0.63, FEF 25-75% 0.59/42%. Walk test 04/02/17 on room air-lowest saturation 92% Began zith/clofazimine/ [place] March, 2019- failed to tolerate.  Sputum culture 11/05/18- 4+ POS AFB  -----------------------------------------------------------------------------------------   07/15/2018- 78 year old female never smoker followed for cough, bronchiectasis, obstructive airway disease , history of myocarditis Cavitary lesion with history of hemoptysis resected, RML/RLL lobectomy 09/2014 for bronchiectasis, right pleural effusion, left breast cancer/seeds/partial mastect, IBS, hearing loss She blamed pulmonary rehab and her pneumatic vest for rib fractures.  Had thoracic spine surgery.  Indicates that IBS is a major quality of life problem. Sensorineural hearing loss being addressed at [facility] and blamed [place] for repeated hemoptysis.  Sputum cx 08/07/17 again 3+ Pos MAIC -----pt states breathing has been worse; reports cough w/ thick yellow mucus & musculoskeletal pain  Continues Zithromax maintenance Cough remains productive yellow green, but without blood or fever.  Activity limited by osteoporotic fracture pain- esp spine. Pending f/u w NSGY for this. Cough mainly hurts her back.  Considered antibiotic and decided to wait. Doxy tolerated,  stopped hemoptysis w/o affecting cough last time. She's afraid of quinolone impact on bones/ tendons and wants this saved as last resort.  She asks about getting pending labs drawn while here.  11/16/2018- 78 year old female never smoker followed for cough, bronchiectasis, obstructive airway disease , history of myocarditis Cavitary lesion with history of hemoptysis resected, RML/RLL lobectomy 09/2014 for bronchiectasis, right pleural effusion,  Complicated by  ASCVD, left breast cancer/seeds/partial mastect, IBS, hearing loss ID televisit note from 10/20 reviewed- details her medical experience with available therapies. Continues azithromycin monotherapy, despite concerns of developing resistance to this. Yellow sputum, no blood or fever, no adenopathy.  BP high on arrival 160/ 110- she will discuss with PCP. UTD flu vax. Fighting weight loss " I eat". Sputum culture 11/05/18- 4+ POS AFB ordered by ID/ Dr Tommy Medal CT chest Hi Res 08/14/2018-  IMPRESSION: 1. 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 2. Small chronic right pleural effusion, minimally increased compared to the prior study. 3. Status post right lower and middle lobectomy. 4. Aortic atherosclerosis, in addition to left anterior descending coronary artery disease. Assessment for potential risk factor modification, dietary therapy or pharmacologic therapy may be warranted, if clinically indicated. 5. Additional incidental findings, as above. Aortic Atherosclerosis (ICD10-I70.0).   ROS- see HPI    + = positive Constitutional:   + weight loss, night sweats, fevers, chills, fatigue, lassitude. HEENT:   No-  headaches, difficulty swallowing, tooth/dental problems, sore throat,       No-  sneezing, itching, ear ache, nasal congestion, + post nasal drip,  CV:  + Tussive left rib pain residual from fracture, orthopnea, PND,  swelling in lower extremities, anasarca, dizziness, palpitations Resp: +  shortness of breath with exertion or at rest.              +  productive cough,  little non-productive cough,  coughing up  of blood.               + change in color of mucus.  No- wheezing.   Skin: No-   rash or lesions. GI:  No-   heartburn, indigestion, abdominal pain, nausea, vomiting, +IBS GU:  MS:  No-   joint pain or swelling.  + back pain Neuro-     nothing unusual Psych:  No- change in mood or affect. No depression or anxiety.  No memory loss.  OBJ- Physical Exam     General- Alert, Oriented, Affect-appropriate, Distress- none acute, +thin, +wheelchair Skin- rash-none, lesions- none, excoriation- none Lymphadenopathy- none Head- atraumatic            Eyes- Gross vision intact, PERRLA, conjunctivae and secretions clear            Ears- Hearing aides            Nose- Clear, no-Septal dev, mucus, polyps, erosion, perforation             Throat- Mallampati II , mucosa clear , drainage- none, tonsils- atrophic Neck- flexible , trachea midline, no stridor , thyroid nl, carotid no bruit Chest - symmetrical excursion , unlabored           Heart/CV- Reg pulse , no murmur , no gallop  , no rub, nl s1 s2                           - JVD- none , edema- none, stasis changes- none, varices- none           Lung- + wheeze upper zones, cough-none , dullness+ right base, rub- none           Chest wall-  Abd-  Br/ Gen/ Rectal- Not done, not indicated Extrem- cyanosis- none, clubbing, none, atrophy- none, strength- nl Neuro- grossly intact to observation

## 2018-11-17 NOTE — Patient Instructions (Addendum)
Order- CXR   - Dx Mycobacterium avium bronchiectasis  Sample Breztri inhaler     Inhale 2 puffs, twice daily     See if this helps your breathing.  Keep your appointment with Dr Tommy Medal on Dec 1  Please call if we can help

## 2018-11-19 ENCOUNTER — Telehealth: Payer: Self-pay | Admitting: Internal Medicine

## 2018-11-19 ENCOUNTER — Other Ambulatory Visit: Payer: Self-pay

## 2018-11-19 NOTE — Telephone Encounter (Signed)
Notes recorded by Deneise Lever, MD on 11/17/2018 at 4:23 PM EST  CXR- areas of scarring and mucus plugging as expected. No dramatic change or new process.  lmtcb

## 2018-11-20 NOTE — Telephone Encounter (Signed)
Spoke with pt. She is aware of results. Nothing further was needed.  

## 2018-11-20 NOTE — Telephone Encounter (Signed)
Pt calling back to get results

## 2018-11-21 ENCOUNTER — Encounter: Payer: Self-pay | Admitting: Endocrinology

## 2018-11-21 ENCOUNTER — Ambulatory Visit (INDEPENDENT_AMBULATORY_CARE_PROVIDER_SITE_OTHER): Payer: Medicare Other | Admitting: Endocrinology

## 2018-11-21 VITALS — BP 138/88 | HR 100 | Ht 59.0 in | Wt 83.8 lb

## 2018-11-21 DIAGNOSIS — E871 Hypo-osmolality and hyponatremia: Secondary | ICD-10-CM

## 2018-11-21 DIAGNOSIS — R413 Other amnesia: Secondary | ICD-10-CM | POA: Diagnosis not present

## 2018-11-21 DIAGNOSIS — R0600 Dyspnea, unspecified: Secondary | ICD-10-CM | POA: Insufficient documentation

## 2018-11-21 LAB — BASIC METABOLIC PANEL
BUN: 9 mg/dL (ref 6–23)
CO2: 33 mEq/L — ABNORMAL HIGH (ref 19–32)
Calcium: 9.5 mg/dL (ref 8.4–10.5)
Chloride: 83 mEq/L — ABNORMAL LOW (ref 96–112)
Creatinine, Ser: 0.42 mg/dL (ref 0.40–1.20)
GFR: 145.52 mL/min (ref >60.00)
Glucose, Bld: 102 mg/dL — ABNORMAL HIGH (ref 70–99)
Potassium: 3.1 mEq/L — ABNORMAL LOW (ref 3.5–5.1)
Sodium: 126 mEq/L — ABNORMAL LOW (ref 135–145)

## 2018-11-21 LAB — URINALYSIS, ROUTINE W REFLEX MICROSCOPIC
Bilirubin Urine: NEGATIVE
Ketones, ur: NEGATIVE
Leukocytes,Ua: NEGATIVE
Nitrite: NEGATIVE
Specific Gravity, Urine: 1.015 (ref 1.000–1.030)
Total Protein, Urine: 30 — AB
Urine Glucose: NEGATIVE
Urobilinogen, UA: 0.2 (ref 0.0–1.0)
pH: 7 (ref 5.0–8.0)

## 2018-11-21 LAB — VITAMIN B12: Vitamin B-12: 168 pg/mL — ABNORMAL LOW (ref 211–911)

## 2018-11-21 LAB — CORTISOL
Cortisol, Plasma: 23 ug/dL
Cortisol, Plasma: 40.2 ug/dL

## 2018-11-21 LAB — BRAIN NATRIURETIC PEPTIDE: Pro B Natriuretic peptide (BNP): 64 pg/mL (ref 0.0–100.0)

## 2018-11-21 MED ORDER — COSYNTROPIN 0.25 MG IJ SOLR
0.2500 mg | Freq: Once | INTRAMUSCULAR | Status: AC
  Administered 2018-11-21: 0.25 mg via INTRAMUSCULAR

## 2018-11-21 NOTE — Progress Notes (Signed)
Subjective:    Patient ID: Mia Hernandez, female    DOB: [birth year], 78 y.o.   MRN: [id number]  HPI Pt is referred by Dr Birdie Riddle, for hyponatremia.  According to Epic, pt was first noted to have hyponatremia in 2013.  However, she says she had this for many years prior to that.  She denies the following: duiretic, adrenal dz, cirrhosis, CHF, hypothyroidism, renal dz. brain injury, chemotx, narcotics, SSRI, GBS.  She has h/o atyp mycobacteria.  She seldom takes Vicodin approx 1 per day.  She has slight doe sensation in the chest, and assoc weight loss.    Past Medical History:  Diagnosis Date  . Acute left eye pain 11/15/2014  . Adverse effect of general anesthetic    DELERIUM  . ALLERGIC RHINITIS   . Anxiety   . Arthritis   . Bronchiectasis ([place])   . Cancer ([place])    basal cell skin cancer removed  . Chronic back pain   . Complication of anesthesia    s/p lung surgery at [place] X 2 MONTHS  . Diverticulosis   . Dysphagia 10/13/2014  . Hydropneumothorax 11/15/2014  . Hypertension   . Hyponatremia 09/15/2014  . Internal hemorrhoids   . Irritable bowel syndrome with constipation   . 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 Osteoporosis   . Stroke ([place])    TIA  SEVERAL YEARS AGO X 1 (5YRS -30 YRS AGO)  . Subcutaneous emphysema ([place]) 10/13/2014  . Toe pain 11/15/2014    Past Surgical History:  Procedure Laterality Date  . BREAST BIOPSY    . BREAST LUMPECTOMY WITH RADIOACTIVE SEED LOCALIZATION Left 10/05/2015   Procedure: LEFT BREAST LUMPECTOMY WITH RADIOACTIVE SEED LOCALIZATION;  Surgeon: Excell Seltzer, MD;  Location: [facility];  Service: General;  Laterality: Left;  . CATARACT EXTRACTION W/ INTRAOCULAR LENS  IMPLANT, BILATERAL    . CHOLECYSTECTOMY    . LAMINECTOMY     cervical   . VAGINAL HYSTERECTOMY      Social History   Socioeconomic  History  . Marital status: Married    Spouse name: Not on file  . Number of children: Not on file  . Years of education: Not on file  . Highest education level: Not on file  Occupational History  . Occupation: retired  Scientific laboratory technician  . Financial resource strain: Not on file  . Food insecurity    Worry: Not on file    Inability: Not on file  . Transportation needs    Medical: Not on file    Non-medical: Not on file  Tobacco Use  . Smoking status: Never Smoker  . Smokeless tobacco: Never Used  Substance and Sexual Activity  . Alcohol use: No  . Drug use: No  . Sexual activity: Not on file  Lifestyle  . Physical activity    Days per week: Not on file    Minutes per session: Not on file  . Stress: Not on file  Relationships  . Social Herbalist on phone: Not on file    Gets together: Not on file    Attends religious service: Not on file    Active member of club or organization: Not on file    Attends meetings of clubs or organizations: Not on file    Relationship status: Not on file  . Intimate partner violence  Fear of current or ex partner: Not on file    Emotionally abused: Not on file    Physically abused: Not on file    Forced sexual activity: Not on file  Other Topics Concern  . Not on file  Social History Narrative  . Not on file    Current Outpatient Medications on File Prior to Visit  Medication Sig Dispense Refill  . azithromycin (ZITHROMAX) 250 MG tablet Take 250 mg by mouth daily.    . Budeson-Glycopyrrol-Formoterol (BREZTRI AEROSPHERE) 160-9-4.8 MCG/ACT AERO Inhale 2 puffs into the lungs 2 (two) times daily. 10.7 g 0  . HYDROcodone-acetaminophen (NORCO/VICODIN) 5-325 MG tablet Take 1 tablet by mouth every 6 (six) hours as needed.    . irbesartan (AVAPRO) 150 MG tablet Take 1 tablet (150 mg total) by mouth daily. please schedule an appt for further refills, 1st attempt 90 tablet 0  . polyethylene glycol (MIRALAX / GLYCOLAX) packet Take 17 g by  mouth daily as needed.     . Vitamin D, Cholecalciferol, 50 MCG (2000 UT) CAPS Take by mouth.     No current facility-administered medications on file prior to visit.     Allergies  Allergen Reactions  . Ethambutol Palpitations    Possible optic neuritis, possible gout attack  . Latex Rash  . Penicillins Hives    Breaks out in hives  . Prednisone Other (See Comments)     "out of mind" state  . Rifabutin Other (See Comments)    Loss of vision and anorexia  . Aspirin   . Betadine [Povidone Iodine]   . Codeine   . Iodinated Diagnostic Agents   . 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 Morphine And Related   . Sulfa Antibiotics Other (See Comments)  . Azithromycin Other (See Comments)    Heart rhythm issues-prolonged QT wave    Family History  Problem Relation Age of Onset  . Breast cancer Mother   . Stroke Father   . Pulmonary embolism Brother   . Rectal cancer Neg Hx   . Stomach cancer Neg Hx   . Esophageal cancer Neg Hx   . Colon cancer Neg Hx     BP 138/88 (BP Location: Right Arm, Patient Position: Sitting, Cuff Size: Normal)   Pulse 100   Ht 4\' 11"  (1.499 m)   Wt 83 lb 12.8 oz (38 kg)   SpO2 96%   BMI 16.93 kg/m   Review of Systems Denies fever, numbness, visual loss, palpitations, n/v, polyuria, open wound, rhinorrhea, cold intolerance, nocturia, and headache.  She has chronic memory loss, intermitt diarrhea, and hearing loss.  She has chronic back pain.       Objective:   Physical Exam VS: see vs page GEN: no distress.  In wheelchair HEAD: head: no deformity eyes: no periorbital swelling, no proptosis external nose and ears are normal NECK: supple, thyroid is not enlarged CHEST WALL: kyphosis is noted.  LUNGS: clear to auscultation CV: reg rate and rhythm, no murmur ABD: abdomen is soft, nontender.  no hepatosplenomegaly.  not distended.  no hernia.   MUSCULOSKELETAL: muscle bulk and strength are grossly normal.  no obvious joint  swelling.  gait is not tested.   EXTEMITIES: no deformity.  no leg edema.   PULSES: no carotid bruit NEURO:  cn 2-12 grossly intact.   readily moves all 4's.  sensation is intact to touch on all 4's SKIN:  Normal texture and temperature.  No rash or  suspicious lesion is visible.   NODES:  None palpable at the neck PSYCH: alert, oriented x 3.  Does not appear anxious nor depressed.   Lab Results  Component Value Date   CREATININE 0.34 (L) 10/13/2018   BUN 8 10/13/2018   NA 128 (L) 10/13/2018   K 3.7 10/13/2018   CL 88 (L) 10/13/2018   CO2 31 10/13/2018    Lab Results  Component Value Date   CHOL 168 07/15/2018   HDL 73.80 07/15/2018   LDLCALC 81 07/15/2018   TRIG 67.0 07/15/2018   CHOLHDL 2 07/15/2018   I have reviewed outside records, and summarized: Pt was noted to have hyponatremia, and referred here.  She was also seen by pulm and ID, for 2 different pulm problems.      Assessment & Plan:  Hyponatremia: new to me, uncertain etiology.   Chronic pain: this is possible cause.    Patient Instructions  Blood tests are requested for you today.  We'll let you know about the results.   Based on the results, I hope to be able to prescribe for you a pill for the low salt level.

## 2018-11-21 NOTE — Patient Instructions (Addendum)
Blood tests are requested for you today.  We'll let you know about the results.   Based on the results, I hope to be able to prescribe for you a pill for the low salt level.

## 2018-11-21 NOTE — Progress Notes (Signed)
Per orders of Dr. Ellison injection of Cortrosyn 0.25mg given IM in R deltoid today by A. Thaddeus Evitts, LPN . Denies any adverse reactions or discomfort.  

## 2018-11-23 ENCOUNTER — Encounter: Payer: Self-pay | Admitting: Internal Medicine

## 2018-11-23 NOTE — Assessment & Plan Note (Signed)
Gradual worsening of bronchiectasis with mucus plugging. Does better if she uses her flutter device regularly. She complained of chest wall pain with  Pneumatic vest. I am not optimistic unless ID can get her [facility] under control. Plan- update CXR, sample Breztri for trial

## 2018-11-23 NOTE — Assessment & Plan Note (Signed)
She is developing an adult failure to thrive pattern with weight loss She has not tolerated available meds. Awaiting sensitivites on latest cultures as she follows up with ID.

## 2018-11-27 ENCOUNTER — Encounter: Payer: Self-pay | Admitting: Gastroenterology

## 2018-11-27 ENCOUNTER — Ambulatory Visit (INDEPENDENT_AMBULATORY_CARE_PROVIDER_SITE_OTHER): Payer: Medicare Other | Admitting: Gastroenterology

## 2018-11-27 VITALS — HR 60 | Temp 97.4°F | Ht 59.0 in | Wt 81.0 lb

## 2018-11-27 DIAGNOSIS — R634 Abnormal weight loss: Secondary | ICD-10-CM

## 2018-11-27 DIAGNOSIS — R109 Unspecified abdominal pain: Secondary | ICD-10-CM | POA: Diagnosis not present

## 2018-11-27 DIAGNOSIS — M549 Dorsalgia, unspecified: Secondary | ICD-10-CM | POA: Diagnosis not present

## 2018-11-27 DIAGNOSIS — G8929 Other chronic pain: Secondary | ICD-10-CM | POA: Diagnosis not present

## 2018-11-27 NOTE — Patient Instructions (Addendum)
If you are age 79 or older, your body mass index should be between 23-30. Your Body mass index is 16.36 kg/m. If this is out of the aforementioned range listed, please consider follow up with your Primary Care Provider.  If you are age 31 or younger, your body mass index should be between 19-25. Your Body mass index is 16.36 kg/m. If this is out of the aformentioned range listed, please consider follow up with your Primary Care Provider.   To help prevent the possible spread of infection to our patients, communities, and staff; we will be implementing the following measures:  As of now we are not allowing any visitors/family members to accompany you to any upcoming appointments with [facility]. If you have any concerns about this please contact our office to discuss prior to the appointment.    You have been scheduled for a CT scan of the abdomen and pelvis at [facility], 1st floor Radiology.  You are scheduled on Wednesday, December 2nd at 1:00pm. You should arrive 15 minutes prior to your appointment time for registration. Please follow the written instructions below on the day of your exam:  1) Do not eat anything after 9:00am (4 hours prior to your test)  This test typically takes 30-45 minutes to complete.  If you have any questions regarding your exam or if you need to reschedule, you may call Radiology at [phone] between the hours of 8:00 am and 5:00 pm, Monday-Friday.  ______________________________________________________   Thank you for entrusting me with your care and for choosing [facility], Dr. [place] Cellar

## 2018-11-27 NOTE — Progress Notes (Signed)
HPI :  78 year old female here for follow-up visit.  I have seen her in the past for chronic bloating/constipation, chronic abdominal pain.  She has multiple comorbidities to include MAC / bronchiectasis (on chronic antibiotics), pleural effusion, and multiple compression fractures of her spine. She has ongoing back pain.  Thus far she has had a work-up with labs, MRI of abdomen and October 2018, no cause was noted.  We performed a colonoscopy in August of this year which did not show any concerning findings, other than multiple colon polyps which were benign and not causing any problems. She has had no benefit with several treatment options to include probiotics, simethicone, Beano, IB gard, low FODMAP diet. I had recommended empiric therapy with rifaximin which she will not try due to severe allergy to rifabutin.She has tested negative for celiac disease.   Since of last seen her she is using MiraLAX fairly routinely and her bowels are moving regularly.  She denies actually much gas and bloating which is new for her, usually this is a very large component of her symptoms and she states it is not too bad right now.  Her main complaint is diffuse abdominal pain which is present most of the time when she is sitting up.  Has seen her she lies down her pain is completely relieved.  She has ongoing severe back pain from her compression fractures.  She is taking Norco once a day given to her by Dr. Trenton Gammon who is managing her back pain.  She states when she takes the Norco it makes her feel much better but it is temporary.  She has been wheelchair-bound for the most part for ambulation.  She has been in pulmonary rehab for her lungs.  She states there is no further plans for treatment of her back.  She has lost a few pounds lately, she previously was around 90 pounds, now down to the low 80s.  She is eating well.  Her pain is present all the time and never goes away and she states is very positional.  She is  concerned about other pathology driving this.  Of note she has an allergy to IV CT contrast and states she will never drink oral contrast again.  She also states she will never have an MRI again. She has had normal cortisol levels recently. She has been evaluated for chronic hyponatremia which persists per Dr. Loanne Drilling, chronic pain is thought to be a possible cause.   MRI abdomen - 11/02/2016 - prominent stool burden, R pleural effusion small, bronchiectasis, mild intrahepatic dilation likely secondary to cholecystectomy, chronic compression fx Colonoscopy 01/29/2008 - diverticulosis   Colonoscopy 08/21/18  - Tortuous colon. - The examined portion of the ileum was normal. - Diverticulosis in the entire examined colon. - One diminutive polyp in the ascending colon, removed with a cold snare. Resected and retrieved. - Two 4 mm polyps in the transverse colon, removed with a cold snare. Resected and retrieved. - One 3 mm polyp in the descending colon, removed with a cold snare. Resected and retrieved. - One 8 to 10 mm polyp in the rectum, removed with a cold snare. Resected and retrieved. - Internal hemorrhoids. - The examination was otherwise normal.  Path shows polyps are adenomas.      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[place])    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   Optic  neuritis 11/15/2014   Osteoporosis    Stroke ([place])    TIA  SEVERAL YEARS AGO X 1 (67YRS -30 YRS AGO)   Subcutaneous emphysema ([place]) 10/13/2014   Toe pain 11/15/2014     Past Surgical History:  Procedure Laterality Date   BREAST BIOPSY     BREAST LUMPECTOMY WITH RADIOACTIVE SEED LOCALIZATION Left 10/05/2015   Procedure: LEFT BREAST LUMPECTOMY WITH RADIOACTIVE SEED LOCALIZATION;  Surgeon: Excell Seltzer, MD;  Location: [facility];  Service: General;  Laterality: Left;   CATARACT EXTRACTION W/ INTRAOCULAR LENS  IMPLANT, BILATERAL     CHOLECYSTECTOMY     LAMINECTOMY     cervical    VAGINAL HYSTERECTOMY     Family History  Problem Relation Age of Onset   Breast cancer Mother    Stroke Father    Pulmonary embolism Brother    Rectal cancer Neg Hx    Stomach cancer Neg Hx    Esophageal cancer Neg Hx    Colon cancer Neg Hx    Social History   Tobacco Use   Smoking status: Never Smoker   Smokeless tobacco: Never Used  Substance Use Topics   Alcohol use: No   Drug use: No   Current Outpatient Medications  Medication Sig Dispense Refill   azithromycin (ZITHROMAX) 250 MG tablet Take 250 mg by mouth daily.     Budeson-Glycopyrrol-Formoterol (BREZTRI AEROSPHERE) 160-9-4.8 MCG/ACT AERO Inhale 2 puffs into the lungs 2 (two) times daily. 10.7 g 0   HYDROcodone-acetaminophen (NORCO/VICODIN) 5-325 MG tablet Take 1 tablet by mouth every 6 (six) hours as needed.     irbesartan (AVAPRO) 150 MG tablet Take 1 tablet (150 mg total) by mouth daily. please schedule an appt for further refills, 1st attempt 90 tablet 0   polyethylene glycol (MIRALAX / GLYCOLAX) packet Take 17 g by mouth daily as needed.      Vitamin D, Cholecalciferol, 50 MCG (2000 UT) CAPS Take by mouth.     No current facility-administered medications for this visit.    Allergies  Allergen Reactions   Ethambutol Palpitations    Possible optic neuritis, possible gout attack   Latex Rash   Penicillins  Hives    Breaks out in hives   Prednisone Other (See Comments)     "out of mind" state   Rifabutin Other (See Comments)    Loss of vision and anorexia   Aspirin    Betadine [Povidone Iodine]    Codeine    Iodinated Diagnostic Agents    Macrolides And Ketolides     Myocarditis from zithromax   Morphine And Related    Sulfa Antibiotics Other (See Comments)   Azithromycin Other (See Comments)    Heart rhythm issues-prolonged QT wave     Review of Systems: All systems reviewed and negative except where noted in HPI.    Dg Chest 2 View  Result Date: 11/17/2018 CLINICAL DATA:  COPD, history of chronic  mycobacterial infection EXAM: CHEST - 2 VIEW COMPARISON:  08/02/2017 Correlation: Chest CT 08/14/2018 FINDINGS: Normal heart size mediastinal contours. Atherosclerotic calcification aorta. Emphysematous changes and bronchitic changes consistent with COPD. Severe pleuroparenchymal scarring and volume loss in the RIGHT hemithorax. Asymmetric RIGHT apical pleural thickening with RIGHT pleural effusion and basilar atelectasis. New LEFT upper lobe opacities since prior chest radiograph correspond to nodular foci on interval CT question due to mucous plugging, slightly progressive. No LEFT pleural effusion or pneumothorax. Osseous demineralization with multiple thoracolumbar compression fractures and numerous prior spinal augmentation procedures. Probable RIGHT nipple shadow. IMPRESSION: COPD changes with extensive pleuroparenchymal scarring in the RIGHT hemithorax associated with volume loss. Slightly progressive opacities in the LEFT upper lobe corresponding to nodular foci on prior CT question mucous plugging and developing bronchiectasis related to known chronic atypical mycobacterial infection. No definite acute infiltrate. Electronically Signed   By: Lavonia Dana M.D.   On: 11/17/2018 14:30    Physical Exam: Pulse 60    Temp (!) 97.4 F (36.3 C)    Ht _0  (1.499 m)    Wt 81 lb (36.7  kg)    BMI 16.36 kg/m  Constitutional: Pleasant, frail female in no acute distress. HEENT: Normocephalic and atraumatic. Conjunctivae are normal. No scleral icterus. Neck supple.  Cardiovascular: Normal rate, regular rhythm.  Pulmonary/chest: Effort normal and breath sounds normal. No wheezing, rales or rhonchi. Abdominal: Soft, nondistended, diffusely tender in abdomen. There are no masses palpable. No hepatomegaly. Extremities: no edema Lymphadenopathy: No cervical adenopathy noted. Neurological: Alert and oriented to person place and time. Skin: Skin is warm and dry. No rashes noted. Psychiatric: Normal mood and affect. Behavior is normal.   ASSESSMENT AND PLAN: 78 y/o female here for reassessment of the following issues:  Abdominal pain / chronic back pain / weight loss - very difficult situation.  This is been an ongoing issue and I suspect her multiple compression fractures in her spine and immobility is leading to her abdominal pain, this seems very likely to be musculoskeletal in nature.  It is very positional and is relieved with lying down.  She is unfortunately bound to a wheelchair at this point for ambulation.  I have reviewed her work-up with her, her recent colonoscopy looked okay without any cause for her pain.  She has had a prior MRI done for this issue which did not show any clear cause. Her pain is mostly in the lower to mid abdomen, I think likelihood of an EGD showing a clear cause is probably low, she has no prandial association. We are treating her constipation and her bloating and gas has improved, however she continues to have significant pain throughout her entire body.  She does respond well to [place] but she is hesitant to take this.  I discussed with her and her husband that I think she probably has musculoskeletal pain and I do not think this is being driven by her GI tract, more so her significant compression fractures.  She will follow up with her physician who is  managing this, Dr. Trenton Gammon, to discuss her long-term medication options for this.  She is concerned about her weight loss, I am not sure if her hyponatremia is causing poor appetite.  I offered her additional imaging to ensure were not missing anything else concerning although I think the yield may be low.  She did want to proceed with imaging however is adamant she will not drink oral contrast and does not want to have IV contrast  given her allergy to it.  We discussed premedicating for IV contrast yet she does not want to have that.  She also is declining an MRI.  In this light I offered her a noncontrast CT scan of the abdomen pelvis which may be limited, but hopefully can provide some reassurance and make sure nothing too concerning.  She understands limitations of this test but wants to proceed with it.  Otherwise continue her MiraLAX.  She may benefit from higher dosing of narcotics to provide a better quality of life as she currently appears to be in significant discomfort from her back.  She will follow up with her pain management physician in the near future to discuss this.  [place] Cellar, MD [facility]

## 2018-11-28 LAB — ARGININE VASOPRESSIN HORMONE
ADH: <0.8 pg/mL (ref 0.0–4.7)
Osmolality Meas: 255 mOsmol/kg — ABNORMAL LOW (ref 280–301)

## 2018-11-29 ENCOUNTER — Other Ambulatory Visit: Payer: Self-pay | Admitting: Endocrinology

## 2018-11-29 MED ORDER — POTASSIUM CHLORIDE CRYS ER 20 MEQ PO TBCR: 20.0000 meq | EXTENDED_RELEASE_TABLET | Freq: Every day | ORAL | 1 refills | Status: DC

## 2018-12-01 ENCOUNTER — Telehealth: Payer: Self-pay | Admitting: Family Medicine

## 2018-12-01 NOTE — Telephone Encounter (Signed)
Called and left a detailed message to advise.  

## 2018-12-01 NOTE — Telephone Encounter (Signed)
Please advise 

## 2018-12-01 NOTE — Telephone Encounter (Signed)
Pt's husband called in asking if pt should be taking a Vit. B supplement or should she take a vit b-12 injection. He states that at last appt at Endo they told her her Vit B levels were very low.

## 2018-12-01 NOTE — Telephone Encounter (Signed)
Since I did not order these tests, I would call Endo and ask them about next steps

## 2018-12-09 ENCOUNTER — Other Ambulatory Visit: Payer: Self-pay

## 2018-12-09 ENCOUNTER — Ambulatory Visit (INDEPENDENT_AMBULATORY_CARE_PROVIDER_SITE_OTHER): Payer: Medicare Other | Admitting: Infectious Disease

## 2018-12-09 DIAGNOSIS — S12190D Other displaced fracture of second cervical vertebra, subsequent encounter for fracture with routine healing: Secondary | ICD-10-CM

## 2018-12-09 DIAGNOSIS — J471 Bronchiectasis with (acute) exacerbation: Secondary | ICD-10-CM | POA: Diagnosis not present

## 2018-12-09 DIAGNOSIS — K589 Irritable bowel syndrome without diarrhea: Secondary | ICD-10-CM

## 2018-12-09 DIAGNOSIS — A310 Pulmonary mycobacterial infection: Secondary | ICD-10-CM

## 2018-12-09 NOTE — Progress Notes (Signed)
Virtual Visit via Telephone Note  I connected with Mia Hernandez on 12/09/18 at  9:30 AM EST by telephone and verified that I am speaking with the correct person using two identifiers.  Location: Patient: Home Provider: [facility]   I discussed the limitations, risks, security and privacy concerns of performing an evaluation and management service by telephone and the availability of in person appointments. I also discussed with the patient that there may be a patient responsible charge related to this service. The patient expressed understanding and agreed to proceed.  Complaint: severe abdominal pain  History of Present Illness: 78year-old lady with past medical history significant for diagnosis of Mycobacterium avium infection of the lungs, ] Nocardia  INTERVAL HiSTORY:   She had on therapy with clarithromycin rifampin and ethambutol 3 days per week. She had great difficulty tolerating the Biaxin which was she was taking it twice daily doses on the day she was taking it was reduced to once daily dosing on July 01 2012 Apparently at that time there was concern about toxicity although she states that now that she was found to need simply new hearing aids. Initially while on therapy for Mycobacterium avium showed mprovement in her symptoms of chronic daily cough and sputum production.   In Fall of 2013 while [place] 2013 apparently she had episodes of hemoptysis and was hospitalized due to Hospital in [place] where she was placed in isolation for rule out tuberculosis. Ultimately was realized that she was suffering from Mycobacterium avium infection alone.  She had repeat sputum taken Spring of 2014 and again grow Mycobacterium avium. The organism was sent for susceptibility testing and showed macrolide sensitivity and in vitro synergy with rifampin and ETH in inhibiting growth in lab. Amikacin would also be considered active at S she had.  I had Changed her to  daily therapy with macrolide  (Azithromycin) ethambutol and rifampin. Since change she had been initiallycoughing less, she was gaining weight and was  absent fevers.   She saw Dr. Annamaria Boots in November 2014 when she reported 1 month she is coughing up more yellow/liquid phlem since on Rafampin. Chest x-ray was done and showed no changes. Sputum was sent for routine culture which only isolated normal oral pharyngeal flora. AFB culture is without AFB seen on smear blood cultures in November DID grow M avium again   I had tried to get her on daily RIFABUTIN with continue azithro and ETH but she did not tolerate at all with severe anorexia, and apparent visual problems, stating that "I could not see for several hours."  She went back to Rifampin TIW,  azithro 551m daily, ETH 709mTU, TH< SA< SU  In the interim she was seen by her Cardiologist Dr. CrSallyanne Kusterwho saw her in October and found NEW  deep symmetrical inverted T waves in all the inferior leads as well as V3-V6. The QTC prolongation on EKG that persistd on followup EKG. he was concern for possible antibiotic induced myocarditis and the patient stopped her anti-Mycobacterium drugs and had MRI of her heart performed which was normal echocardiogram was also normal.  Repeat EKG done in the next few days showed resolution of T wave changes and improvement in QT  Since coming off her anti-Mycobacterium avium drugs she had continued to cough on a daily basis   She had CXR that showed expansion of cavitary disease though most recent CXR showed stable findings.   Note her M avium was R to Moxifloxacin with MIC of  4.  It was I to linezolid with MIC of 16  It was S to clarithro with MIC of 2  Further MICs were:  Amikacin 16 Cipro 16 Ethambutol 8 Ethimodate > INH 8 Rifampin >8 Rifabutin 0.5 Streptomcin 32  Combination of ETH with clarithro, erythrom, rifampin, rifabutin all produced no growth in vitro  See below      She came to our clinic  multiple times in  Spring 2016 with worsening cough, with blood tinged sputum and actual hemoptysis, fatigue, lack of energy.  I corresponded with Dr Lorenda Cahill via email and he recommended not starting any therapy until he had a chance to review her case himself when she comes to visit him. She is on schedule to see him in August.  She was worked into clinic before seeing Dr. Lorenda Cahill with  worsening blood tinged sputum and we ultimately decided upon consultation with Dr Lorenda Cahill and with Dr Sallyanne Kuster to re-challenge the patient with Azithro and [facility] which we did and there have been no EKG changes so far to suggest pericarditis , myocarditis or issues with QT prolongation.  Since she started on meds she has had improvement in sputum, no longer blood tinged and she is now able to sleep.  She was then seen  Dr Lorenda Cahill who collected sputum sample (though I cannot access it in [place]). Dr. Lorenda Cahill felt upon review of her films that her pulmonary disease was amenable to combined medical and surgical approach with resection of large cavitary areas from lungs to be considered by Dr. Elenor Quinones at [facility].   In the interm she grew Nocardia species from sputum and has been started on zyvox which she was tolerating  But then developed nose blleeds.  She ultimately underwent surgery at [facility] on 09/27/14 with bronchoscopy, thoracoscopy with removal of 2 lobes of lung. She was changed from zyvox to rocephin (to which her Norardia was S) and has remained on this with plan of 6 weeks of IV abx. It is believed that the infected part of lung was removed with surgery. She also grew M avium from prior culture from Dr.Stout's office.  Her cough had DRAMATICALLY improved postoperatively but she did develop significant postoperative subcutaneous emphysema. She had resumed her azithromycin and ETH. She has had some dysphagia due to subcutaneous emphysema but this is improving.  A few visits since then she  developed NEW complaints  of acute left toe pain, with pain in her left eye and decreased visual acuity, along with malaise, nausea and reduced hearing.She stopped EThambutol and with our advise also the azithromycin to avoid monotherapy for her M avium.   WE had in the interim considered procuring clofazamine. Dr. Brigitte Pulse at [facility] corresponded with me and was very skeptical that the patients eye pain was due to [facility] and recommended rechallenge with [facility] at 59m/kg TIW along with continued Azithro first.   Unfortunately when she restarted those meds she began experiencing eye pain, joint pain and palpitations and stopped all f these meds again in [place] 2017.  She CLAIMED at one of her more recent visits that  [id number] there was confirmation of the azithromycin causing her to have QT prolongation and myocarditis.  I see that she did have an EKG on January 06, 2016 which showed frequent PVCs but I do not see changes consistent with myocarditis.  Furthermore her QTC interval was similar in August 2018 when she was off of medications.   I had not seen her since  that visit in the December 2017.  In the interim she has had worsening weight loss which she partly attributes to IBS.  She also though has had worsening cough at times with hemoptysis.  Imaging including CT scan of the lungs was done recently which shows November 2018:  1. Progressive bronchiectasis involving the right lower lung with areas of cylindrical and cystic bronchiectasis. Patchy airspace nodules and endobronchial debris/ mucous plugging possibly related to a bronchiectasis related opportunistic infection. 2. Patchy areas of tree-in-bud appearance suggesting chronic inflammation or MAC. 3. No focal airspace consolidation, pulmonary edema or worrisome pulmonary mass. 4. Chronic right-sided pleural thickening and loculated fluid.  She is referred back to [place] by Dr. Annamaria Boots and she has yet again unsurprisingly growing Mycobacterium avium from her  sputum cultures.   We had worked to obtain inhaled amikacin + clofazimine and azithromycin to come up with a salvage regimen given her intolerances of different meds.  She has her INH amikacin and would like 3 month supply of azithro as is cheaper for her. We do NOT yet have clofazimine.  She had been continuing to have daily severe cough that brings up copious sputum and blood tinged material at times. She had lost weight , has poor appetite and frequent sore throat. She has had problems with dizziness as well. She has issues with bloating and  Urge to defecate and being worked up for IBS and being considered for endoscopy.   In the interval we then started her sequentially on azithromycin, clofazamine and more recently INH amikacin.  She noted that her hemoptysis stopped shortly after starting the macrolide. She also had EKG done with Dr. Sallyanne Kuster.  Which showed no abnormalities on EKG.  She  Stated that when she started taking clofazimine it did change the color of her sputum.  She feels that since she started inhaled amikacin that the inhaled amikacin is causing her to cough more and she is at times not been taking it as she did yesterday.  She has a myriad of symptoms, many of which I could not clearly attribute to her medications.  She continued to cough though it seems like that is better than before though now she thinks the amikacin is making her cough more and this certainly could be the case since it is inhaled medication.  She is going to go to pulmonary rehab per Dr. Janee Morn instructions.  She was  telling me that she is having trouble sleeping due to the coughing still she says that she has trouble thinking straight that she has trouble with dizziness.  She also has problems with nausea with abdominal pain.  She was  less loose bowel movements than before.  As her hearing was checked recently and had changed.  With anything she complains of having profound  fatigue and feeling exhausted.  She had been on a regimen of azithromycin and clofazimine and inhaled amikacin.  However she AGAIN began attributing multiple symptoms to the inhaled amikacin and clofazimine and stopped these medications against our advice.  She continued on azithromycin after restarting it when she developed hemoptysis.  She has stayed on this and despite having been seen by our nurse practitioner Terri Piedra. And recommended to STOP monotherapy for MAvium she continued on monotherapy.  Against our recommendation she continued on azithromycin monotherapy.  She had  been on and off the azithromycin I believe due to her anxiety about the fact that it might be efficacious against coronavirus 2019 or that the supply  may run out.  She had worsening cough with blood-streaked sputum off azithromycin but went back on azithromycin had less blood in her sputum but continues to have daily cough fits.  She was undergoing treatment for her osteoporosis at present is having difficult moving about.  At one of her last visits we reinitiated azithromycin with clofazimine.  However she had difficulty again tolerating this was having more and more abdominal pain and stopped the clofazimine and continued azithromycin by itself.  She is still coughing every day.     I have counseled her that giving azithromycin monotherapy will not help in the long-term and will only potentiate macrolide resistance and her Mycobacterium avium.  She was willing to try to reinitiate clofazimine with the azithromycin.  As has been detailed previously she has had multiple drug intolerances  However after restarting the clofazimine again she had worsening GI symptoms and stopped it.  She has been on azithromycin monotherapy since then.  She has seen Dr. Annamaria Boots ordered a CT scan a few weeks ago which showed the following:     1. Worsening areas of bronchiectasis and mucoid impaction, as above, compatible with the  reported clinical history of chronic atypical mycobacterial infection. 2. Small chronic right pleural effusion, minimally increased compared to the prior study. 3. Status post right lower and middle lobectomy. 4. Aortic atherosclerosis, in addition to left anterior descending coronary artery disease. Assessment for potential risk factor modification, dietary therapy or pharmacologic therapy may be warranted, if clinically indicated. 5. Additional incidental findings, as above.   2 visits ago when I talked her on the phone she actually had to have her husband do much of the communicating because she was in severe pain in her lower back.  She attributes this to having undergone recent colonoscopy which seems to have triggered muscle spasm and severe pain in lower back.  Her cough frequency and volume had not worsened since I last talked to her.    At last visit  when I talked to her however she said her coughing is much much worse than it was before despite taking the azithromycin.  I am concerned that she now has macrolide resistance.  I have asked her to come collect a sputum kit to submit an AFB culture.    She is indeed growing copious M avium though I do not have Sensitivity data on it yet  She is still on macrolide monotherapy.  She has daily cough but today is more bothered by severe abdominal pain that is being worked up by GI and she is scheduled for CT scan tomorrow.  Observations/Objective:  Mycobacterium avium infection: Probably is acquiring macrolide resistance now which is going to compromise our ability to treat her at all. I have asked her to stop this altogether.   Irritable bowel syndrome: The past the clofazimine she claims worsened her IBS but I think given the extent of her coughing will be worth reinitiating this.  Osteoporosis: She has had several interventions of interventional radiology.    Assessment and Plan:  Mycobacterium avium: I will call micro to see  status of susceptibilities and I will see her back in early January. I am very skeptical we can pick an active regimen for her that she can adhere to. It may be wiser simply to stick with physical maneuvers and "pulmonary toilet" under direction of Pulmonary  IBS: Continue to follow GI, and CT tomorrow  Osteoporosis follow-up with primary and interventional radiology Dr. Saintclair Halsted  Follow Up Instructions:    I discussed the assessment and treatment plan with the patient. The patient was provided an opportunity to ask questions and all were answered. The patient agreed with the plan and demonstrated an understanding of the instructions.   The patient was advised to call back or seek an in-person evaluation if the symptoms worsen or if the condition fails to improve as anticipated.  I provided 21 minutes of non-face-to-face time during this encounter.   Alcide Evener, MD

## 2018-12-10 ENCOUNTER — Other Ambulatory Visit: Payer: Self-pay

## 2018-12-10 ENCOUNTER — Ambulatory Visit (HOSPITAL_COMMUNITY)
Admission: RE | Admit: 2018-12-10 | Discharge: 2018-12-10 | Disposition: A | Discharge status: Home / Self Care | Payer: Medicare Other | Source: Ambulatory Visit | Attending: Gastroenterology | Admitting: Gastroenterology

## 2018-12-10 DIAGNOSIS — R109 Unspecified abdominal pain: Secondary | ICD-10-CM | POA: Diagnosis not present

## 2018-12-10 DIAGNOSIS — N200 Calculus of kidney: Secondary | ICD-10-CM | POA: Diagnosis not present

## 2018-12-17 ENCOUNTER — Telehealth: Payer: Self-pay | Admitting: *Deleted

## 2018-12-17 NOTE — Telephone Encounter (Signed)
[facility] called regarding sputum culture result, positive for Mycobacterium.  Dr Tommy Medal is aware, and has requested this be sent for identification with sensitivity/suspectibilities.  [facility] confirmed it is. Landis Gandy, RN

## 2018-12-19 ENCOUNTER — Telehealth: Payer: Self-pay

## 2018-12-19 NOTE — Telephone Encounter (Signed)
-----   Message from Truman Hayward, MD sent at 12/09/2018 11:26 AM EST ----- Regarding: M avium Sensi data Can someone ask Santiago Glad to check if [facility] is checking for susceptibility testing on this M avium? If not it needs to be SENT for sensi testing and if possible add on sensitivity testing for clofazamine. [employer] does it if [facility] themselves do not do it

## 2018-12-20 NOTE — Telephone Encounter (Signed)
I have not yet heard from Mia Hernandez re this

## 2018-12-22 ENCOUNTER — Ambulatory Visit (INDEPENDENT_AMBULATORY_CARE_PROVIDER_SITE_OTHER): Payer: Medicare Other | Admitting: Endocrinology

## 2018-12-22 ENCOUNTER — Encounter: Payer: Self-pay | Admitting: Endocrinology

## 2018-12-22 ENCOUNTER — Other Ambulatory Visit: Payer: Self-pay

## 2018-12-22 DIAGNOSIS — E876 Hypokalemia: Secondary | ICD-10-CM | POA: Diagnosis not present

## 2018-12-22 DIAGNOSIS — E871 Hypo-osmolality and hyponatremia: Secondary | ICD-10-CM | POA: Diagnosis not present

## 2018-12-22 MED ORDER — POTASSIUM CHLORIDE ER 8 MEQ PO CPCR: 24.0000 meq | ORAL_CAPSULE | Freq: Every day | ORAL | 11 refills | Status: AC

## 2018-12-22 NOTE — Patient Instructions (Signed)
I have sent a prescription to your pharmacy, for a smaller potassium pill.  You would take 3 per day.  Please redo the blood tests in 1 week. You can to at Dr Tabori's office, but please call ahead.   Please take non-prescription vitamin B-12, 1 mg per day.   Please come back for a follow-up appointment in 1 month.

## 2018-12-22 NOTE — Progress Notes (Signed)
Subjective:    Patient ID: Mia Hernandez, female    DOB: Apr 06, 1940, 78 y.o.   MRN: [id number]  HPI telehealth visit today via doxy video visit.  Alternatives to telehealth are presented to this patient, and the patient agrees to the telehealth visit.  Pt is advised of the cost of the visit, and agrees to this, also.   Patient is at home, and I am at the office.   Persons attending the telehealth visit: the patient, husband, and I.   Pt returns for f/u of hyponatremia (according to Epic, pt was first noted to have hyponatremia in 2013; however, she says she had this for many years prior to that.  Only cause found was chronic pain; she also has hypokalemia).  Pt says she is not taking the KLOR now, as it was too big to swallow.   Past Medical History:  Diagnosis Date  . Acute left eye pain 11/15/2014  . Adverse effect of general anesthetic    DELERIUM  . ALLERGIC RHINITIS   . Anxiety   . Arthritis   . Bronchiectasis ([place])   . Cancer ([facility])    basal cell skin cancer removed  . Chronic back pain   . Complication of anesthesia    s/p lung surgery at [place] X 2 MONTHS  . Diverticulosis   . Dysphagia 10/13/2014  . Hydropneumothorax 11/15/2014  . Hypertension   . Hyponatremia 09/15/2014  . Internal hemorrhoids   . Irritable bowel syndrome with constipation   . Mycobacterium avium-intracellulare infection ([place])   . 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 Osteoporosis   . Stroke ([place])    TIA  SEVERAL YEARS AGO X 1 (93YRS -30 YRS AGO)  . Subcutaneous emphysema ([place]) 10/13/2014  . Toe pain 11/15/2014    Past Surgical History:  Procedure Laterality Date  . BREAST BIOPSY    . BREAST LUMPECTOMY WITH RADIOACTIVE SEED LOCALIZATION Left 10/05/2015   Procedure: LEFT BREAST LUMPECTOMY WITH RADIOACTIVE SEED LOCALIZATION;  Surgeon: Excell Seltzer, MD;  Location: [facility];  Service: General;  Laterality: Left;    . CATARACT EXTRACTION W/ INTRAOCULAR LENS  IMPLANT, BILATERAL    . CHOLECYSTECTOMY    . LAMINECTOMY     cervical   . VAGINAL HYSTERECTOMY      Social History   Socioeconomic History  . Marital status: Married    Spouse name: Not on file  . Number of children: Not on file  . Years of education: Not on file  . Highest education level: Not on file  Occupational History  . Occupation: retired  Tobacco Use  . Smoking status: Never Smoker  . Smokeless tobacco: Never Used  Substance and Sexual Activity  . Alcohol use: No  . Drug use: No  . Sexual activity: Not on file  Other Topics Concern  . Not on file  Social History Narrative  . Not on file   Social Determinants of Health   Financial Resource Strain:   . Difficulty of Paying Living Expenses: Not on file  Food Insecurity:   . Worried About Charity fundraiser in the Last Year: Not on file  . Ran Out of Food in the Last Year: Not on file  Transportation Needs:   . Lack of Transportation (Medical): Not on file  . Lack of Transportation (Non-Medical): Not on file  Physical Activity:   . Days of Exercise per Week: Not on  file  . Minutes of Exercise per Session: Not on file  Stress:   . Feeling of Stress : Not on file  Social Connections:   . Frequency of Communication with Friends and Family: Not on file  . Frequency of Social Gatherings with Friends and Family: Not on file  . Attends Religious Services: Not on file  . Active Member of Clubs or Organizations: Not on file  . Attends Archivist Meetings: Not on file  . Marital Status: Not on file  Intimate Partner Violence:   . Fear of Current or Ex-Partner: Not on file  . Emotionally Abused: Not on file  . Physically Abused: Not on file  . Sexually Abused: Not on file    Current Outpatient Medications on File Prior to Visit  Medication Sig Dispense Refill  . Budeson-Glycopyrrol-Formoterol (BREZTRI AEROSPHERE) 160-9-4.8 MCG/ACT AERO Inhale 2 puffs into  the lungs 2 (two) times daily. 10.7 g 0  . HYDROcodone-acetaminophen (NORCO/VICODIN) 5-325 MG tablet Take 1 tablet by mouth every 6 (six) hours as needed.    . irbesartan (AVAPRO) 150 MG tablet Take 1 tablet (150 mg total) by mouth daily. please schedule an appt for further refills, 1st attempt 90 tablet 0  . polyethylene glycol (MIRALAX / GLYCOLAX) packet Take 17 g by mouth daily as needed.     . Vitamin D, Cholecalciferol, 50 MCG (2000 UT) CAPS Take by mouth.     No current facility-administered medications on file prior to visit.    Allergies  Allergen Reactions  . Ethambutol Palpitations    Possible optic neuritis, possible gout attack  . Latex Rash  . Penicillins Hives    Breaks out in hives  . Prednisone Other (See Comments)     "out of mind" state  . Rifabutin Other (See Comments)    Loss of vision and anorexia  . Aspirin   . Betadine [Povidone Iodine]   . Codeine   . Iodinated Diagnostic Agents   . 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 Morphine And Related   . Sulfa Antibiotics Other (See Comments)  . Azithromycin Other (See Comments)    Heart rhythm issues-prolonged QT wave    Family History  Problem Relation Age of Onset  . Breast cancer Mother   . Stroke Father   . Pulmonary embolism Brother   . Rectal cancer Neg Hx   . Stomach cancer Neg Hx   . Esophageal cancer Neg Hx   . Colon cancer Neg Hx     There were no vitals taken for this visit.  Review of Systems No n/v    Objective:   Physical Exam      Assessment & Plan:  Hyponatremia.  Hypokalemia.  Pill intolerance, new.   Patient Instructions  I have sent a prescription to your pharmacy, for a smaller potassium pill.  You would take 3 per day.  Please redo the blood tests in 1 week. You can to at Dr Tabori's office, but please call ahead.   Please take non-prescription vitamin B-12, 1 mg per day.   Please come back for a follow-up appointment in 1 month.

## 2018-12-23 ENCOUNTER — Telehealth: Payer: Self-pay

## 2018-12-23 NOTE — Telephone Encounter (Signed)
-----   Message from Truman Hayward, MD sent at 12/09/2018 11:26 AM EST ----- Regarding: M avium Sensi data Can someone ask Santiago Glad to check if [facility] is checking for susceptibility testing on this M avium? If not it needs to be SENT for sensi testing and if possible add on sensitivity testing for clofazamine. [employer] does it if [facility] themselves do not do it

## 2018-12-24 NOTE — Telephone Encounter (Signed)
They may need to just send it to a different reference lab ultimately to check clofazimine susceptibility  This is a difficult case anyway since she is intolerant of some any medications

## 2018-12-30 ENCOUNTER — Telehealth: Payer: Self-pay | Admitting: *Deleted

## 2018-12-30 ENCOUNTER — Other Ambulatory Visit: Payer: Self-pay

## 2018-12-30 ENCOUNTER — Encounter: Payer: Self-pay | Admitting: Cardiovascular Disease

## 2018-12-30 ENCOUNTER — Telehealth (INDEPENDENT_AMBULATORY_CARE_PROVIDER_SITE_OTHER): Payer: Medicare Other | Admitting: Cardiovascular Disease

## 2018-12-30 DIAGNOSIS — Z8679 Personal history of other diseases of the circulatory system: Secondary | ICD-10-CM | POA: Diagnosis not present

## 2018-12-30 DIAGNOSIS — R9431 Abnormal electrocardiogram [ECG] [EKG]: Secondary | ICD-10-CM | POA: Diagnosis not present

## 2018-12-30 DIAGNOSIS — I10 Essential (primary) hypertension: Secondary | ICD-10-CM

## 2018-12-30 DIAGNOSIS — E871 Hypo-osmolality and hyponatremia: Secondary | ICD-10-CM

## 2018-12-30 NOTE — Patient Instructions (Signed)
Medication Instructions:  No changes *If you need a refill on your cardiac medications before your next appointment, please call your pharmacy*  Lab Work: None ordered If you have labs (blood work) drawn today and your tests are completely normal, you will receive your results only by: Marland Kitchen MyChart Message (if you have MyChart) OR . A paper copy in the mail If you have any lab test that is abnormal or we need to change your treatment, we will call you to review the results.  Testing/Procedures: None ordered  Follow-Up: At [facility], you and your health needs are our priority.  As part of our continuing mission to provide you with exceptional heart care, we have created designated Provider Care Teams.  These Care Teams include your primary Cardiologist (physician) and Advanced Practice Providers (APPs -  Physician Assistants and Nurse Practitioners) who all work together to provide you with the care you need, when you need it.  Your next appointment:   6 month(s)  The format for your next appointment:   Either In Person or Virtual  Provider:   You may see Sanda Klein, MD or one of the following Advanced Practice Providers on your designated Care Team:    Almyra Deforest, PA-C  Fabian Sharp, PA-C or   Roby Lofts, [place]

## 2018-12-30 NOTE — Telephone Encounter (Signed)
Virtual Visit Pre-Appointment Phone Call  "(Name), I am calling you today to discuss your upcoming appointment. We are currently trying to limit exposure to the virus that causes COVID-19 by seeing patients at home rather than in the office."  1. "What is the BEST phone number to call the day of the visit?" - include this in appointment notes  2. "Do you have or have access to (through a family member/friend) a smartphone with video capability that we can use for your visit?" a. If yes - list this number in appt notes as "cell" (if different from BEST phone #) and list the appointment type as a VIDEO visit in appointment notes b. If no - list the appointment type as a PHONE visit in appointment notes  Confirm consent - "In the setting of the current Covid19 crisis, you are scheduled for a (phone or video) visit with your provider on (date) at (time).  Just as we do with many in-office visits, in order for you to participate in this visit, we must obtain consent.  If you'd like, I can send this to your mychart (if signed up) or email for you to review.  Otherwise, I can obtain your verbal consent now.  All virtual visits are billed to your insurance company just like a normal visit would be.  By agreeing to a virtual visit, we'd like you to understand that the technology does not allow for your provider to perform an examination, and thus may limit your provider's ability to fully assess your condition. If your provider identifies any concerns that need to be evaluated in person, we will make arrangements to do so.  Finally, though the technology is pretty good, we cannot assure that it will always work on either your or our end, and in the setting of a video visit, we may have to convert it to a phone-only visit.  In either situation, we cannot ensure that we have a secure connection.  Are you willing to proceed?" YES 3. Advise patient to be prepared - "Two hours prior to your appointment, go ahead  and check your blood pressure, pulse, oxygen saturation, and your weight (if you have the equipment to check those) and write them all down. When your visit starts, your provider will ask you for this information. If you have an Apple Watch or Kardia device, please plan to have heart rate information ready on the day of your appointment. Please have a pen and paper handy nearby the day of the visit as well."  4. Give patient instructions for MyChart download to smartphone OR Doximity/Doxy.me as below if video visit (depending on what platform provider is using)  5. Inform patient they will receive a phone call 15 minutes prior to their appointment time (may be from unknown caller ID) so they should be prepared to answer    TELEPHONE CALL NOTE  Ahonesty Cossio has been deemed a candidate for a follow-up tele-health visit to limit community exposure during the Covid-19 pandemic. I spoke with the patient via phone to ensure availability of phone/video source, confirm preferred email & phone number, and discuss instructions and expectations.  I reminded Zeynab Pikus to be prepared with any vital sign and/or heart rhythm information that could potentially be obtained via home monitoring, at the time of her visit. I reminded Raylynn Meuser to expect a phone call prior to her visit.  Ricci Barker, RN 12/30/2018 9:50 AM   INSTRUCTIONS FOR DOWNLOADING THE MYCHART APP  TO SMARTPHONE  - The patient must first make sure to have activated MyChart and know their login information - If Apple, go to [employer] and type in MyChart in the search bar and download the app. If Android, ask patient to go to [employer] and type in [place] in the search bar and download the app. The app is free but as with any other app downloads, their phone may require them to verify saved payment information or Apple/Android password.  - The patient will need to then log into the app with their MyChart username and password, and  select [facility] as their healthcare provider to link the account. When it is time for your visit, go to the MyChart app, find appointments, and click Begin Video Visit. Be sure to Select Allow for your device to access the Microphone and Camera for your visit. You will then be connected, and your provider will be with you shortly.  **If they have any issues connecting, or need assistance please contact MyChart service desk [phone])**  **If using a computer, in order to ensure the best quality for their visit they will need to use either of the following Internet Browsers: [employer], or Google Chrome**  IF USING DOXIMITY or DOXY.ME - The patient will receive a link just prior to their visit by text.     FULL LENGTH CONSENT FOR TELE-HEALTH VISIT   I hereby voluntarily request, consent and authorize [facility] and its employed or contracted physicians, physician assistants, nurse practitioners or other licensed health care professionals (the Practitioner), to provide me with telemedicine health care services (the "Services") as deemed necessary by the treating Practitioner. I acknowledge and consent to receive the Services by the Practitioner via telemedicine. I understand that the telemedicine visit will involve communicating with the Practitioner through live audiovisual communication technology and the disclosure of certain medical information by electronic transmission. I acknowledge that I have been given the opportunity to request an in-person assessment or other available alternative prior to the telemedicine visit and am voluntarily participating in the telemedicine visit.  I understand that I have the right to withhold or withdraw my consent to the use of telemedicine in the course of my care at any time, without affecting my right to future care or treatment, and that the Practitioner or I may terminate the telemedicine visit at any time. I understand that I have  the right to inspect all information obtained and/or recorded in the course of the telemedicine visit and may receive copies of available information for a reasonable fee.  I understand that some of the potential risks of receiving the Services via telemedicine include:  Marland Kitchen Delay or interruption in medical evaluation due to technological equipment failure or disruption; . Information transmitted may not be sufficient (e.g. poor resolution of images) to allow for appropriate medical decision making by the Practitioner; and/or  . In rare instances, security protocols could fail, causing a breach of personal health information.  Furthermore, I acknowledge that it is my responsibility to provide information about my medical history, conditions and care that is complete and accurate to the best of my ability. I acknowledge that Practitioner's advice, recommendations, and/or decision may be based on factors not within their control, such as incomplete or inaccurate data provided by me or distortions of diagnostic images or specimens that may result from electronic transmissions. I understand that the practice of medicine is not an exact science and that Practitioner makes no warranties  or guarantees regarding treatment outcomes. I acknowledge that I will receive a copy of this consent concurrently upon execution via email to the email address I last provided but may also request a printed copy by calling the office of [facility].    I understand that my insurance will be billed for this visit.   I have read or had this consent read to me. . I understand the contents of this consent, which adequately explains the benefits and risks of the Services being provided via telemedicine.  . I have been provided ample opportunity to ask questions regarding this consent and the Services and have had my questions answered to my satisfaction. . I give my informed consent for the services to be provided through the use  of telemedicine in my medical care  By participating in this telemedicine visit I agree to the above.

## 2018-12-30 NOTE — Progress Notes (Signed)
Virtual Visit via Telephone Note   This visit type was conducted due to national recommendations for restrictions regarding the COVID-19 Pandemic (e.g. social distancing) in an effort to limit this patient's exposure and mitigate transmission in our community.  Due to her co-morbid illnesses, this patient is at least at moderate risk for complications without adequate follow up.  This format is felt to be most appropriate for this patient at this time.  The patient did not have access to video technology/had technical difficulties with video requiring transitioning to audio format only (telephone).  All issues noted in this document were discussed and addressed.  No physical exam could be performed with this format.  Please refer to the patient's chart for her  consent to telehealth for [facility].   Date:  12/30/2018   ID:  Mia Hernandez, DOB 10/27/1940, MRN [id number]  Patient Location: Home Provider Location: Office  PCP:  Midge Minium, MD  Cardiologist:   Haley Fuerstenberg Electrophysiologist:  None   Evaluation Performed:  Follow-Up Visit  Chief Complaint:  Abnormal ECG, hypertension  History of Present Illness:    Mia Hernandez is a 78 y.o. 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  Recently she has had deteriorating functional status, worsening memory and disorientation.  Her husband had to help with a phone conversation today, both because she could not hear, but also because she has difficulty understanding.  She has chronic back pain and abdominal pain and steady weight loss.  She now only weighs about 80 pounds.  She has multiple compression fractions in her spine.  Following this marked weight loss her blood pressure is very easy to control.  She has not had recent problems with angina or  dyspnea.  She has had issues with hyponatremia and hypokalemia.  She has been evaluated by Dr. Loanne Drilling.  Work-up was negative for adrenal insufficiency.  She continues to see Dr. Tommy Medal, with some difficulty establishing a good antibacterial regimen due to her medication side effects and antimicrobial sensitivities.  She has been found to have very low B12 levels.  She has chronic problems with constipation.  The patient does not have symptoms concerning for COVID-19 infection (fever, chills, cough, or new shortness of breath).    Past Medical History:  Diagnosis Date  . Acute left eye pain 11/15/2014  . Adverse effect of general anesthetic    DELERIUM  . ALLERGIC RHINITIS   . Anxiety   . Arthritis   . Bronchiectasis ([place])   . Cancer ([place])    basal cell skin cancer removed  . Chronic back pain   . Complication of anesthesia    s/p lung surgery at [facility] X 2 MONTHS  . Diverticulosis   . Dysphagia 10/13/2014  . Hydropneumothorax 11/15/2014  . Hypertension   . Hyponatremia 09/15/2014  . Internal hemorrhoids   . Irritable bowel syndrome with constipation   . Mycobacterium avium-intracellulare infection ([place])   . 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 Osteoporosis   . Stroke ([place])    TIA  SEVERAL YEARS AGO X 1 (89YRS -30 YRS AGO)  . Subcutaneous emphysema ([place]) 10/13/2014  . Toe pain 11/15/2014   Past Surgical History:  Procedure Laterality Date  . BREAST BIOPSY    . BREAST LUMPECTOMY WITH RADIOACTIVE SEED LOCALIZATION Left 10/05/2015  Procedure: LEFT BREAST LUMPECTOMY WITH RADIOACTIVE SEED LOCALIZATION;  Surgeon: Excell Seltzer, MD;  Location: [facility];  Service: General;  Laterality: Left;  . CATARACT EXTRACTION W/ INTRAOCULAR LENS  IMPLANT, BILATERAL    . CHOLECYSTECTOMY    . LAMINECTOMY     cervical   . VAGINAL HYSTERECTOMY       Current Meds  Medication Sig  .  Budeson-Glycopyrrol-Formoterol (BREZTRI AEROSPHERE) 160-9-4.8 MCG/ACT AERO Inhale 2 puffs into the lungs 2 (two) times daily.  Marland Kitchen HYDROcodone-acetaminophen (NORCO/VICODIN) 5-325 MG tablet Take 1 tablet by mouth every 6 (six) hours as needed.  . irbesartan (AVAPRO) 150 MG tablet Take 1 tablet (150 mg total) by mouth daily. please schedule an appt for further refills, 1st attempt  . polyethylene glycol (MIRALAX / GLYCOLAX) packet Take 17 g by mouth daily as needed.   . Potassium Chloride CR (MICRO-K) 8 MEQ CPCR capsule CR Take 3 capsules (24 mEq total) by mouth daily.  . Vitamin D, Cholecalciferol, 50 MCG (2000 UT) CAPS Take by mouth.     Allergies:   Ethambutol, Latex, Penicillins, Prednisone, Rifabutin, Aspirin, Betadine [povidone iodine], Codeine, Iodinated diagnostic agents, Macrolides and ketolides, Morphine and related, Sulfa antibiotics, and Azithromycin   Social History   Tobacco Use  . Smoking status: Never Smoker  . Smokeless tobacco: Never Used  Substance Use Topics  . Alcohol use: No  . Drug use: No     Family Hx: The patient's family history includes Breast cancer in her mother; Pulmonary embolism in her brother; Stroke in her father. There is no history of Rectal cancer, Stomach cancer, Esophageal cancer, or Colon cancer.  ROS:   Please see the history of present illness.     All other systems reviewed and are negative.   Prior CV studies:   The following studies were reviewed today:  Notes from Dr. Loanne Drilling and Dr. Tommy Medal  Labs/Other Tests and Data Reviewed:    EKG:  An ECG dated 09/20/2017 was personally reviewed today and demonstrated:  Sinus rhythm, possible left atrial abnormality, normal QTC 436 ms  Recent Labs: 07/15/2018: ALT 17; Hemoglobin 12.3; Platelets 415.0; TSH 1.19 11/21/2018: BUN 9; Creatinine, Ser 0.42; Potassium 3.1; Pro B Natriuretic peptide (BNP) 64.0; Sodium 126   Recent Lipid Panel Lab Results  Component Value Date/Time   CHOL 168 07/15/2018  10:06 AM   TRIG 67.0 07/15/2018 10:06 AM   HDL 73.80 07/15/2018 10:06 AM   CHOLHDL 2 07/15/2018 10:06 AM   LDLCALC 81 07/15/2018 10:06 AM    Wt Readings from Last 3 Encounters:  11/27/18 81 lb (36.7 kg)  11/21/18 83 lb 12.8 oz (38 kg)  11/17/18 84 lb 3.2 oz (38.2 kg)     Objective:    Vital Signs:  BP 122/80   Pulse (!) 104   Temp 98.1 F (36.7 C)   Ht 4\' 11"  (1.499 m)   BMI 16.36 kg/m    VITAL SIGNS:  reviewed Unable to examine   ASSESSMENT & PLAN:    1. HTN: Easily controlled on monotherapy with irbesartan, following substantial weight loss. 2. MAI infection: It would be worthwhile, considering her history to perform an ECG at least every 6-12 months.  Its been well over a year since her last tracing.  We will try to see if we can arrange this in her primary care provider's office, or at her appointment with her pulmonary specialist or ID specialist, all of which she is scheduled to do in the next 3 months.  I am not sure which of these appointments will be in person and which will be virtual. 3. Hyponatremia: At least partly explained by dehydration and poor oral intake.  I am concerned about her lack of interactivity and disorientation today.  I have recommended repeat labs via her home health service.  COVID-19 Education: The signs and symptoms of COVID-19 were discussed with the patient and how to seek care for testing (follow up with PCP or arrange E-visit).  The importance of social distancing was discussed today.  Time:   Today, I have spent 20 minutes with the patient with telehealth technology discussing the above problems.     Medication Adjustments/Labs and Tests Ordered: Current medicines are reviewed at length with the patient today.  Concerns regarding medicines are outlined above.   Tests Ordered: No orders of the defined types were placed in this encounter.   Medication Changes: No orders of the defined types were placed in this encounter.   Follow  Up:  Either In Person or Virtual 6 months  Signed, Sanda Klein, MD  12/30/2018 9:15 AM    [place]

## 2018-12-31 ENCOUNTER — Ambulatory Visit (INDEPENDENT_AMBULATORY_CARE_PROVIDER_SITE_OTHER): Payer: Medicare Other

## 2018-12-31 ENCOUNTER — Other Ambulatory Visit: Payer: Self-pay

## 2018-12-31 DIAGNOSIS — I10 Essential (primary) hypertension: Secondary | ICD-10-CM

## 2018-12-31 DIAGNOSIS — S32000G Wedge compression fracture of unspecified lumbar vertebra, subsequent encounter for fracture with delayed healing: Secondary | ICD-10-CM | POA: Diagnosis not present

## 2018-12-31 DIAGNOSIS — M810 Age-related osteoporosis without current pathological fracture: Secondary | ICD-10-CM

## 2018-12-31 DIAGNOSIS — E440 Moderate protein-calorie malnutrition: Secondary | ICD-10-CM | POA: Diagnosis not present

## 2018-12-31 LAB — CBC WITH DIFFERENTIAL/PLATELET
Basophils Absolute: 0.1 10*3/uL (ref 0.0–0.1)
Basophils Relative: 0.6 % (ref 0.0–3.0)
Eosinophils Absolute: 0 10*3/uL (ref 0.0–0.7)
Eosinophils Relative: 0 % (ref 0.0–5.0)
HCT: 39.4 % (ref 36.0–46.0)
Hemoglobin: 13 g/dL (ref 12.0–15.0)
Lymphocytes Relative: 6.2 % — ABNORMAL LOW (ref 12.0–46.0)
Lymphs Abs: 0.8 10*3/uL (ref 0.7–4.0)
MCHC: 32.9 g/dL (ref 30.0–36.0)
MCV: 87.7 fl (ref 78.0–100.0)
Monocytes Absolute: 1.4 10*3/uL — ABNORMAL HIGH (ref 0.1–1.0)
Monocytes Relative: 11.2 % (ref 3.0–12.0)
Neutro Abs: 10.2 10*3/uL — ABNORMAL HIGH (ref 1.4–7.7)
Neutrophils Relative %: 82 % — ABNORMAL HIGH (ref 43.0–77.0)
Platelets: 536 10*3/uL — ABNORMAL HIGH (ref 150.0–400.0)
RBC: 4.5 Mil/uL (ref 3.87–5.11)
RDW: 15.6 % — ABNORMAL HIGH (ref 11.5–15.5)
WBC: 12.4 10*3/uL — ABNORMAL HIGH (ref 4.0–10.5)

## 2018-12-31 LAB — VITAMIN D 25 HYDROXY (VIT D DEFICIENCY, FRACTURES): VITD: 40.45 ng/mL (ref 30.00–100.00)

## 2018-12-31 LAB — HEPATIC FUNCTION PANEL
ALT: 17 U/L (ref 0–35)
AST: 22 U/L (ref 0–37)
Albumin: 3.7 g/dL (ref 3.5–5.2)
Alkaline Phosphatase: 109 U/L (ref 39–117)
Bilirubin, Direct: 0.2 mg/dL (ref 0.0–0.3)
Total Bilirubin: 0.8 mg/dL (ref 0.2–1.2)
Total Protein: 7.8 g/dL (ref 6.0–8.3)

## 2018-12-31 LAB — LIPID PANEL
Cholesterol: 196 mg/dL (ref 0–200)
HDL: 74.8 mg/dL (ref >39.00)
LDL Cholesterol: 107 mg/dL — ABNORMAL HIGH (ref 0–99)
NonHDL: 121.42
Total CHOL/HDL Ratio: 3
Triglycerides: 70 mg/dL (ref 0.0–149.0)
VLDL: 14 mg/dL (ref 0.0–40.0)

## 2018-12-31 LAB — BASIC METABOLIC PANEL
BUN: 12 mg/dL (ref 6–23)
CO2: 29 mEq/L (ref 19–32)
Calcium: 9.4 mg/dL (ref 8.4–10.5)
Chloride: 86 mEq/L — ABNORMAL LOW (ref 96–112)
Creatinine, Ser: 0.41 mg/dL (ref 0.40–1.20)
GFR: 149.58 mL/min (ref >60.00)
Glucose, Bld: 116 mg/dL — ABNORMAL HIGH (ref 70–99)
Potassium: 4.2 mEq/L (ref 3.5–5.1)
Sodium: 123 mEq/L — ABNORMAL LOW (ref 135–145)

## 2018-12-31 LAB — TSH: TSH: 1.6 u[IU]/mL (ref 0.35–4.50)

## 2019-01-05 ENCOUNTER — Telehealth: Payer: Self-pay | Admitting: *Deleted

## 2019-01-05 ENCOUNTER — Other Ambulatory Visit: Payer: Self-pay | Admitting: Endocrinology

## 2019-01-05 MED ORDER — DEMECLOCYCLINE HCL 300 MG PO TABS: 300.0000 mg | ORAL_TABLET | Freq: Two times a day (BID) | ORAL | 3 refills | Status: AC

## 2019-01-05 NOTE — Telephone Encounter (Signed)
Pt returning call

## 2019-01-05 NOTE — Progress Notes (Signed)
Pt is aware of results and knows to pick up meds

## 2019-01-05 NOTE — Progress Notes (Signed)
Called pt and lmovm to return call.

## 2019-01-05 NOTE — Telephone Encounter (Signed)
Called pt again to review lab results.  Pt needs a lab appt this week at our office.  Also needs to call endo to schedule a follow up appt in 1-2 weeks.

## 2019-01-07 ENCOUNTER — Other Ambulatory Visit: Payer: Self-pay | Admitting: Family Medicine

## 2019-01-07 DIAGNOSIS — D72829 Elevated white blood cell count, unspecified: Secondary | ICD-10-CM

## 2019-01-07 DIAGNOSIS — E870 Hyperosmolality and hypernatremia: Secondary | ICD-10-CM

## 2019-01-07 NOTE — Telephone Encounter (Signed)
Called and advised pt of lab results. Lab scheduled and ordered.

## 2019-01-08 ENCOUNTER — Ambulatory Visit (INDEPENDENT_AMBULATORY_CARE_PROVIDER_SITE_OTHER): Payer: Medicare Other

## 2019-01-08 ENCOUNTER — Other Ambulatory Visit: Payer: Self-pay

## 2019-01-08 DIAGNOSIS — E870 Hyperosmolality and hypernatremia: Secondary | ICD-10-CM

## 2019-01-08 DIAGNOSIS — D72829 Elevated white blood cell count, unspecified: Secondary | ICD-10-CM

## 2019-01-08 NOTE — Addendum Note (Signed)
Addended by: Doran Clay A on: 01/08/2019 10:20 AM   Modules accepted: Orders

## 2019-01-09 LAB — CBC WITH DIFFERENTIAL/PLATELET
Absolute Monocytes: 1244 cells/uL — ABNORMAL HIGH (ref 200–950)
Basophils Absolute: 24 cells/uL (ref 0–200)
Basophils Relative: 0.2 %
Eosinophils Absolute: 24 cells/uL (ref 15–500)
Eosinophils Relative: 0.2 %
HCT: 38.6 % (ref 35.0–45.0)
Hemoglobin: 13.1 g/dL (ref 11.7–15.5)
Lymphs Abs: 525 cells/uL — ABNORMAL LOW (ref 850–3900)
MCH: 28.8 pg (ref 27.0–33.0)
MCHC: 33.9 g/dL (ref 32.0–36.0)
MCV: 84.8 fL (ref 80.0–100.0)
MPV: 8.5 fL (ref 7.5–12.5)
Monocytes Relative: 10.2 %
Neutro Abs: 10382 cells/uL — ABNORMAL HIGH (ref 1500–7800)
Neutrophils Relative %: 85.1 %
Platelets: 503 10*3/uL — ABNORMAL HIGH (ref 140–400)
RBC: 4.55 10*6/uL (ref 3.80–5.10)
RDW: 13.4 % (ref 11.0–15.0)
Total Lymphocyte: 4.3 %
WBC: 12.2 10*3/uL — ABNORMAL HIGH (ref 3.8–10.8)

## 2019-01-09 LAB — BASIC METABOLIC PANEL
BUN/Creatinine Ratio: 27 (calc) — ABNORMAL HIGH (ref 6–22)
BUN: 11 mg/dL (ref 7–25)
CO2: 30 mmol/L (ref 20–32)
Calcium: 9.2 mg/dL (ref 8.6–10.4)
Chloride: 86 mmol/L — ABNORMAL LOW (ref 98–110)
Creat: 0.41 mg/dL — ABNORMAL LOW (ref 0.60–0.93)
Glucose, Bld: 113 mg/dL — ABNORMAL HIGH (ref 65–99)
Potassium: 4 mmol/L (ref 3.5–5.3)
Sodium: 125 mmol/L — ABNORMAL LOW (ref 135–146)

## 2019-01-15 ENCOUNTER — Telehealth: Payer: Self-pay | Admitting: *Deleted

## 2019-01-15 DIAGNOSIS — S32050A Wedge compression fracture of fifth lumbar vertebra, initial encounter for closed fracture: Secondary | ICD-10-CM | POA: Diagnosis not present

## 2019-01-15 NOTE — Telephone Encounter (Signed)
Patient's husband called to see if her appointment 1/11 8:45 can be a telephone visit. Please advise. Landis Gandy, RN

## 2019-01-15 NOTE — Telephone Encounter (Signed)
Definitely 

## 2019-01-16 ENCOUNTER — Telehealth: Payer: Self-pay

## 2019-01-16 ENCOUNTER — Other Ambulatory Visit: Payer: Self-pay

## 2019-01-16 ENCOUNTER — Ambulatory Visit (INDEPENDENT_AMBULATORY_CARE_PROVIDER_SITE_OTHER): Payer: Medicare Other | Admitting: Endocrinology

## 2019-01-16 ENCOUNTER — Encounter: Payer: Self-pay | Admitting: Endocrinology

## 2019-01-16 DIAGNOSIS — E871 Hypo-osmolality and hyponatremia: Secondary | ICD-10-CM

## 2019-01-16 NOTE — Patient Instructions (Signed)
Please have the blood tests again.  Please call ahead, to make a lab appointment.   Please come back for a follow-up appointment in 2 weeks.

## 2019-01-16 NOTE — Progress Notes (Signed)
Subjective:    Patient ID: Mia Hernandez, female    DOB: May 25, 1940, 79 y.o.   MRN: [id number]  HPI telehealth visit today via doxy video visit.  Alternatives to telehealth are presented to this patient, and the patient agrees to the telehealth visit. Pt is advised of the cost of the visit, and agrees to this, also.   Patient is at home, and I am at the office.   Persons attending the telehealth visit: the patient, husband, and I Pt returns for f/u of hyponatremia (according to Epic, pt was first noted to have hyponatremia in 2013; however, she says she had this for many years prior to that.  Only cause found was chronic pain; hypokalemia was rx'ed first, but this did not help; declomycin was added 12/20).  pt reports ongoing fatigue.   Past Medical History:  Diagnosis Date  . Acute left eye pain 11/15/2014  . Adverse effect of general anesthetic    DELERIUM  . ALLERGIC RHINITIS   . Anxiety   . Arthritis   . Bronchiectasis ([place])   . Cancer ([place])    basal cell skin cancer removed  . Chronic back pain   . Complication of anesthesia    s/p lung surgery at [facility] X 2 MONTHS  . Diverticulosis   . Dysphagia 10/13/2014  . Hydropneumothorax 11/15/2014  . Hypertension   . Hyponatremia 09/15/2014  . Internal hemorrhoids   . Irritable bowel syndrome with constipation   . Mycobacterium avium-intracellulare infection ([place])   . 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 Osteoporosis   . Stroke ([place])    TIA  SEVERAL YEARS AGO X 1 (1YRS -30 YRS AGO)  . Subcutaneous emphysema ([place]) 10/13/2014  . Toe pain 11/15/2014    Past Surgical History:  Procedure Laterality Date  . BREAST BIOPSY    . BREAST LUMPECTOMY WITH RADIOACTIVE SEED LOCALIZATION Left 10/05/2015   Procedure: LEFT BREAST LUMPECTOMY WITH RADIOACTIVE SEED LOCALIZATION;  Surgeon: Excell Seltzer, MD;  Location: [facility];  Service: General;  Laterality:  Left;  . CATARACT EXTRACTION W/ INTRAOCULAR LENS  IMPLANT, BILATERAL    . CHOLECYSTECTOMY    . LAMINECTOMY     cervical   . VAGINAL HYSTERECTOMY      Social History   Socioeconomic History  . Marital status: Married    Spouse name: Not on file  . Number of children: Not on file  . Years of education: Not on file  . Highest education level: Not on file  Occupational History  . Occupation: retired  Tobacco Use  . Smoking status: Never Smoker  . Smokeless tobacco: Never Used  Substance and Sexual Activity  . Alcohol use: No  . Drug use: No  . Sexual activity: Not on file  Other Topics Concern  . Not on file  Social History Narrative  . Not on file   Social Determinants of Health   Financial Resource Strain:   . Difficulty of Paying Living Expenses: Not on file  Food Insecurity:   . Worried About Charity fundraiser in the Last Year: Not on file  . Ran Out of Food in the Last Year: Not on file  Transportation Needs:   . Lack of Transportation (Medical): Not on file  . Lack of Transportation (Non-Medical): Not on file  Physical Activity:   . Days of Exercise per Week: Not on file  . Minutes of Exercise per  Session: Not on file  Stress:   . Feeling of Stress : Not on file  Social Connections:   . Frequency of Communication with Friends and Family: Not on file  . Frequency of Social Gatherings with Friends and Family: Not on file  . Attends Religious Services: Not on file  . Active Member of Clubs or Organizations: Not on file  . Attends Archivist Meetings: Not on file  . Marital Status: Not on file  Intimate Partner Violence:   . Fear of Current or Ex-Partner: Not on file  . Emotionally Abused: Not on file  . Physically Abused: Not on file  . Sexually Abused: Not on file    Current Outpatient Medications on File Prior to Visit  Medication Sig Dispense Refill  . Budeson-Glycopyrrol-Formoterol (BREZTRI AEROSPHERE) 160-9-4.8 MCG/ACT AERO Inhale 2 puffs  into the lungs 2 (two) times daily. 10.7 g 0  . demeclocycline (DECLOMYCIN) 300 MG tablet Take 1 tablet (300 mg total) by mouth 2 (two) times daily. 60 tablet 3  . HYDROcodone-acetaminophen (NORCO/VICODIN) 5-325 MG tablet Take 1 tablet by mouth every 6 (six) hours as needed.    . irbesartan (AVAPRO) 150 MG tablet Take 1 tablet (150 mg total) by mouth daily. please schedule an appt for further refills, 1st attempt 90 tablet 0  . polyethylene glycol (MIRALAX / GLYCOLAX) packet Take 17 g by mouth daily as needed.     . Potassium Chloride CR (MICRO-K) 8 MEQ CPCR capsule CR Take 3 capsules (24 mEq total) by mouth daily. 120 capsule 11  . Vitamin D, Cholecalciferol, 50 MCG (2000 UT) CAPS Take by mouth.     No current facility-administered medications on file prior to visit.    Allergies  Allergen Reactions  . Ethambutol Palpitations    Possible optic neuritis, possible gout attack  . Latex Rash  . Penicillins Hives    Breaks out in hives  . Prednisone Other (See Comments)     "out of mind" state  . Rifabutin Other (See Comments)    Loss of vision and anorexia  . Aspirin   . Betadine [Povidone Iodine]   . Codeine   . Iodinated Diagnostic Agents   . 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 Morphine And Related   . Sulfa Antibiotics Other (See Comments)  . Azithromycin Other (See Comments)    Heart rhythm issues-prolonged QT wave    Family History  Problem Relation Age of Onset  . Breast cancer Mother   . Stroke Father   . Pulmonary embolism Brother   . Rectal cancer Neg Hx   . Stomach cancer Neg Hx   . Esophageal cancer Neg Hx   . Colon cancer Neg Hx     There were no vitals taken for this visit.   Review of Systems Denies headache    Objective:   Physical Exam    Lab Results  Component Value Date   CREATININE 0.41 (L) 01/08/2019   BUN 11 01/08/2019   NA 125 (L) 01/08/2019   K 4.0 01/08/2019   CL 86 (L) 01/08/2019   CO2 30 01/08/2019         Assessment & Plan:  Hyponatremia: due for recheck.  Hypokalemia: Please continue the same [place].    Patient Instructions  Please have the blood tests again.  Please call ahead, to make a lab appointment.   Please come back for a follow-up appointment in 2 weeks.

## 2019-01-16 NOTE — Telephone Encounter (Signed)
Patient's husband called in stating that patient is needing future labs that Dr.Ellison ordered today. Scheduled lab appt fir Wednesday.

## 2019-01-19 ENCOUNTER — Ambulatory Visit (INDEPENDENT_AMBULATORY_CARE_PROVIDER_SITE_OTHER): Payer: Medicare Other | Admitting: Infectious Disease

## 2019-01-19 ENCOUNTER — Encounter: Payer: Self-pay | Admitting: Infectious Disease

## 2019-01-19 ENCOUNTER — Other Ambulatory Visit: Payer: Self-pay

## 2019-01-19 DIAGNOSIS — E871 Hypo-osmolality and hyponatremia: Secondary | ICD-10-CM

## 2019-01-19 DIAGNOSIS — M810 Age-related osteoporosis without current pathological fracture: Secondary | ICD-10-CM | POA: Diagnosis not present

## 2019-01-19 DIAGNOSIS — J471 Bronchiectasis with (acute) exacerbation: Secondary | ICD-10-CM | POA: Diagnosis not present

## 2019-01-19 DIAGNOSIS — K589 Irritable bowel syndrome without diarrhea: Secondary | ICD-10-CM | POA: Diagnosis not present

## 2019-01-19 DIAGNOSIS — E440 Moderate protein-calorie malnutrition: Secondary | ICD-10-CM | POA: Diagnosis not present

## 2019-01-19 DIAGNOSIS — A310 Pulmonary mycobacterial infection: Secondary | ICD-10-CM

## 2019-01-19 NOTE — Progress Notes (Signed)
Virtual Visit via Telephone Note  I connected with Mia Hernandez on 01/19/19 at  8:45 AM EST by telephone and verified that I am speaking with the correct person using two identifiers.  Location: Patient: Home Provider: [facility]   I discussed the limitations, risks, security and privacy concerns of performing an evaluation and management service by telephone and the availability of in person appointments. I also discussed with the patient that there may be a patient responsible charge related to this service. The patient expressed understanding and agreed to proceed.  Complaint: severe abdominal pain  History of Present Illness: 79year-old lady with past medical history significant for diagnosis of Mycobacterium avium infection of the lungs, ] Nocardia  INTERVAL HiSTORY:   She had been  therapy with clarithromycin rifampin and ethambutol 3 days per week. She had great difficulty tolerating the Biaxin which was she was taking it twice daily doses on the day she was taking it was reduced to once daily dosing on July 01 2012 Apparently at that time there was concern about toxicity although she states that now that she was found to need simply new hearing aids. Initially while on therapy for Mycobacterium avium showed mprovement in her symptoms of chronic daily cough and sputum production.   In Fall of 2013 while [place] 2013 apparently she had episodes of hemoptysis and was hospitalized due to Hospital in [place] where she was placed in isolation for rule out tuberculosis. Ultimately was realized that she was suffering from Mycobacterium avium infection alone.  She had repeat sputum taken Spring of 2014 and again grow Mycobacterium avium. The organism was sent for susceptibility testing and showed macrolide sensitivity and in vitro synergy with rifampin and ETH in inhibiting growth in lab. Amikacin would also be considered active at S she had.  I had Changed her to  daily therapy with  macrolide (Azithromycin) ethambutol and rifampin. Since change she had been initiallycoughing less, she was gaining weight and was  absent fevers.   She saw Dr. Annamaria Boots in November 2014 when she reported 1 month she is coughing up more yellow/liquid phlem since on Rafampin. Chest x-ray was done and showed no changes. Sputum was sent for routine culture which only isolated normal oral pharyngeal flora. AFB culture is without AFB seen on smear blood cultures in November DID grow M avium again   I had tried to get her on daily RIFABUTIN with continue azithro and ETH but she did not tolerate at all with severe anorexia, and apparent visual problems, stating that "I could not see for several hours."  She went back to Rifampin TIW,  azithro 522m daily, ETH 7051mTU, TH< SA< SU  In the interim she was seen by her Cardiologist Dr. CrSallyanne Kusterwho saw her in October and found NEW  deep symmetrical inverted T waves in all the inferior leads as well as V3-V6. The QTC prolongation on EKG that persistd on followup EKG. he was concern for possible antibiotic induced myocarditis and the patient stopped her anti-Mycobacterium drugs and had MRI of her heart performed which was normal echocardiogram was also normal.  Repeat EKG done in the next few days showed resolution of T wave changes and improvement in QT  Since coming off her anti-Mycobacterium avium drugs she had continued to cough on a daily basis   She had CXR that showed expansion of cavitary disease though most recent CXR showed stable findings.   Note her M avium was R to Moxifloxacin with MIC  of 4.  It was I to linezolid with MIC of 16  It was S to clarithro with MIC of 2  Further MICs were:  Amikacin 16 Cipro 16 Ethambutol 8 Ethimodate > INH 8 Rifampin >8 Rifabutin 0.5 Streptomcin 32  Combination of ETH with clarithro, erythrom, rifampin, rifabutin all produced no growth in vitro  See below      She came to our  clinic multiple times in  Spring 2016 with worsening cough, with blood tinged sputum and actual hemoptysis, fatigue, lack of energy.  I corresponded with Dr Stout via email and he recommended not starting any therapy until he had a chance to review her case himself when she comes to visit him. She is on schedule to see him in August.  She was worked into clinic before seeing Dr. Stout with  worsening blood tinged sputum and we ultimately decided upon consultation with Dr Stout and with Dr Croitoru to re-challenge the patient with Azithro and ETH which we did and there have been no EKG changes so far to suggest pericarditis , myocarditis or issues with QT prolongation.  Since she started on meds she has had improvement in sputum, no longer blood tinged and she is now able to sleep.  She was then seen  Dr Stout who collected sputum sample (though I cannot access it in Care Everywhere). Dr. Stout felt upon review of her films that her pulmonary disease was amenable to combined medical and surgical approach with resection of large cavitary areas from lungs to be considered by Dr. D'Amico at [facility].   In the interm she grew Nocardia species from sputum and has been started on zyvox which she was tolerating  But then developed nose blleeds.  She ultimately underwent surgery at [facility] on 09/27/14 with bronchoscopy, thoracoscopy with removal of 2 lobes of lung. She was changed from zyvox to rocephin (to which her Norardia was S) and has remained on this with plan of 6 weeks of IV abx. It is believed that the infected part of lung was removed with surgery. She also grew M avium from prior culture from Dr.Stout's office.  Her cough had DRAMATICALLY improved postoperatively but she did develop significant postoperative subcutaneous emphysema. She had resumed her azithromycin and ETH. She has had some dysphagia due to subcutaneous emphysema but this is improving.  A few visits since then she  developed NEW  complaints of acute left toe pain, with pain in her left eye and decreased visual acuity, along with malaise, nausea and reduced hearing.She stopped EThambutol and with our advise also the azithromycin to avoid monotherapy for her M avium.   WE had in the interim considered procuring clofazamine. Dr. Jason Stout at [facility] corresponded with me and was very skeptical that the patients eye pain was due to ETH and recommended rechallenge with ETH at 25mg/kg TIW along with continued Azithro first.   Unfortunately when she restarted those meds she began experiencing eye pain, joint pain and palpitations and stopped all f these meds again in Deceeber of 2017.  She CLAIMED at one of her more recent visits that  IN 2019 there was confirmation of the azithromycin causing her to have QT prolongation and myocarditis.  I see that she did have an EKG on January 06, 2016 which showed frequent PVCs but I do not see changes consistent with myocarditis.  Furthermore her QTC interval was similar in August 2018 when she was off of medications.   I had not seen her   since that visit in the December 2017.  In the interim she has had worsening weight loss which she partly attributes to IBS.  She also though has had worsening cough at times with hemoptysis.  Imaging including CT scan of the lungs was done recently which shows November 2018:  1. Progressive bronchiectasis involving the right lower lung with areas of cylindrical and cystic bronchiectasis. Patchy airspace nodules and endobronchial debris/ mucous plugging possibly related to a bronchiectasis related opportunistic infection. 2. Patchy areas of tree-in-bud appearance suggesting chronic inflammation or MAC. 3. No focal airspace consolidation, pulmonary edema or worrisome pulmonary mass. 4. Chronic right-sided pleural thickening and loculated fluid.  She is referred back to us by Dr. Young and she has yet again unsurprisingly growing Mycobacterium  avium from her sputum cultures.   We had worked to obtain inhaled amikacin + clofazimine and azithromycin to come up with a salvage regimen given her intolerances of different meds.  She has her INH amikacin and would like 3 month supply of azithro as is cheaper for her. We do NOT yet have clofazimine.  She had been continuing to have daily severe cough that brings up copious sputum and blood tinged material at times. She had lost weight , has poor appetite and frequent sore throat. She has had problems with dizziness as well. She has issues with bloating and  Urge to defecate and being worked up for IBS and being considered for endoscopy.   In the interval we then started her sequentially on azithromycin, clofazamine and more recently INH amikacin.  She noted that her hemoptysis stopped shortly after starting the macrolide. She also had EKG done with Dr. Croitoru.  Which showed no abnormalities on EKG.  She  Stated that when she started taking clofazimine it did change the color of her sputum.  She feels that since she started inhaled amikacin that the inhaled amikacin is causing her to cough more and she is at times not been taking it as she did yesterday.  She has a myriad of symptoms, many of which I could not clearly attribute to her medications.  She continued to cough though it seems like that is better than before though now she thinks the amikacin is making her cough more and this certainly could be the case since it is inhaled medication.  She is going to go to pulmonary rehab per Dr. Young's instructions.  She was  telling me that she is having trouble sleeping due to the coughing still she says that she has trouble thinking straight that she has trouble with dizziness.  She also has problems with nausea with abdominal pain.  She was  less loose bowel movements than before.  As her hearing was checked recently and had changed.  With anything she complains of  having profound fatigue and feeling exhausted.  She had been on a regimen of azithromycin and clofazimine and inhaled amikacin.  However she AGAIN began attributing multiple symptoms to the inhaled amikacin and clofazimine and stopped these medications against our advice.  She continued on azithromycin after restarting it when she developed hemoptysis.  She has stayed on this and despite having been seen by our nurse practitioner Greg Calone. And recommended to STOP monotherapy for MAvium she continued on monotherapy.  Against our recommendation she continued on azithromycin monotherapy.  She had  been on and off the azithromycin I believe due to her anxiety about the fact that it might be efficacious against coronavirus 2019 or that the   supply may run out.  She had worsening cough with blood-streaked sputum off azithromycin but went back on azithromycin had less blood in her sputum but continues to have daily cough fits.  She was undergoing treatment for her osteoporosis at present is having difficult moving about.  At one of her last visits we reinitiated azithromycin with clofazimine.  However she had difficulty again tolerating this was having more and more abdominal pain and stopped the clofazimine and continued azithromycin by itself.  She is still coughing every day.     I have counseled her that giving azithromycin monotherapy will not help in the long-term and will only potentiate macrolide resistance and her Mycobacterium avium.  She was willing to try to reinitiate clofazimine with the azithromycin.  As has been detailed previously she has had multiple drug intolerances  However after restarting the clofazimine again she had worsening GI symptoms and stopped it.  She has been on azithromycin monotherapy since then.  She has seen Dr. Young ordered a CT scan a few weeks ago which showed the following:     1. 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 2. Small chronic right pleural effusion, minimally increased compared to the prior study. 3. Status post right lower and middle lobectomy. 4. Aortic atherosclerosis, in addition to left anterior descending coronary artery disease. Assessment for potential risk factor modification, dietary therapy or pharmacologic therapy may be warranted, if clinically indicated. 5. Additional incidental findings, as above.   2 visits ago when I talked her on the phone she actually had to have her husband do much of the communicating because she was in severe pain in her lower back.  She attributes this to having undergone recent colonoscopy which seems to have triggered muscle spasm and severe pain in lower back.  Her cough frequency and volume had not worsened since I last talked to her.    At last visit  when I talked to her however she said her coughing is much much worse than it was before despite taking the azithromycin.  I am concerned that she now has macrolide resistance.  I have asked her to come collect a sputum kit to submit an AFB culture.    She is indeed growing copious M avium though I do not have Sensitivity data on it yet  She is was on macrolide monotherapy. We did have her stop this several visits ago.  She continues understandably to have copious sputum production especially in the morning.  Continue to lose weight.  She is also suffering from abdominal pain that is been worked up by GI but which is thought to be largely musculoskeletal.  She tells me that because of her abdominal pain she spends most of her time in bed.    Observations/Objective:  Mycobacterium avium infection: Probably is acquiring macrolide resistance now which is going to compromise our ability to treat her at all.   I have checked with Karen this morning to see if we have the susceptibility data back yet.  We  will schedule her to see me over the phone in a few weeks time hopefully by then we have susceptibility data.  Hopefully we have not now developed macrolide resistance and could use that with clofazimine and ethambutol for example.  Abdominal pain: Thought to be musculoskeletal related to her vertebral disease  Irritable bowel syndrome: The past the clofazimine she claims worsened her IBS   but I think given the extent of her coughing will be worth reinitiating this.  Osteoporosis: She has had several interventions of interventional radiology.    Assessment and Plan:  Mycobacterium avium: Need the susceptibility to make an informed decision about a 3 drug regimen for her IBS: Continue to follow GI, and CT tomorrow  Osteoporosis follow-up with primary and interventional radiology Dr. Cram   Hyponatremia and hypokalemia: To see PCP for labs and being followed by Dr. Ellison as well  Weight loss: MAvium playing a role I would expect but could be multifactorial  Follow Up Instructions:    I discussed the assessment and treatment plan with the patient. The patient was provided an opportunity to ask questions and all were answered. The patient agreed with the plan and demonstrated an understanding of the instructions.   The patient was advised to call back or seek an in-person evaluation if the symptoms worsen or if the condition fails to improve as anticipated.  I provided 15  minutes of non-face-to-face time during this encounter.    [place], MD   

## 2019-01-21 ENCOUNTER — Ambulatory Visit (INDEPENDENT_AMBULATORY_CARE_PROVIDER_SITE_OTHER): Payer: Medicare Other | Admitting: Emergency Medicine

## 2019-01-21 ENCOUNTER — Other Ambulatory Visit: Payer: Self-pay

## 2019-01-21 DIAGNOSIS — E871 Hypo-osmolality and hyponatremia: Secondary | ICD-10-CM | POA: Diagnosis not present

## 2019-01-21 LAB — BASIC METABOLIC PANEL
BUN: 10 mg/dL (ref 6–23)
CO2: 28 mEq/L (ref 19–32)
Calcium: 9.5 mg/dL (ref 8.4–10.5)
Chloride: 84 mEq/L — ABNORMAL LOW (ref 96–112)
Creatinine, Ser: 0.54 mg/dL (ref 0.40–1.20)
GFR: 108.84 mL/min (ref >60.00)
Glucose, Bld: 129 mg/dL — ABNORMAL HIGH (ref 70–99)
Potassium: 3.9 mEq/L (ref 3.5–5.1)
Sodium: 125 mEq/L — ABNORMAL LOW (ref 135–145)

## 2019-01-22 ENCOUNTER — Other Ambulatory Visit: Payer: Self-pay | Admitting: Cardiovascular Disease

## 2019-01-22 ENCOUNTER — Other Ambulatory Visit: Payer: Self-pay | Admitting: Endocrinology

## 2019-01-22 MED ORDER — SODIUM CHLORIDE 1 G PO TABS: 4.0000 g | ORAL_TABLET | Freq: Every day | ORAL | 5 refills | Status: AC

## 2019-01-23 DIAGNOSIS — R1013 Epigastric pain: Secondary | ICD-10-CM | POA: Diagnosis not present

## 2019-01-23 DIAGNOSIS — N200 Calculus of kidney: Secondary | ICD-10-CM | POA: Diagnosis not present

## 2019-01-30 LAB — MYCOBACTERIA,CULT W/FLUOROCHROME SMEAR
MICRO NUMBER:: 1042817
SPECIMEN QUALITY:: ADEQUATE

## 2019-01-30 LAB — M. AVIUM MIC PANEL
AMIKACIN: 32
CIPROFLOXACIN: 8
CLARITHROMYCIN: >64
ETHAMBUTOL: 2
ETHIONAMIDE: 20
ISONIAZID: 8
LINEZOLID: 16
MOXIFLOXACIN: 2
RIFABUTIN: 1
RIFAMPIN: >8
STREPTOMYCIN: 32

## 2019-02-02 ENCOUNTER — Ambulatory Visit (INDEPENDENT_AMBULATORY_CARE_PROVIDER_SITE_OTHER): Payer: Medicare Other | Admitting: Infectious Disease

## 2019-02-02 ENCOUNTER — Other Ambulatory Visit: Payer: Self-pay

## 2019-02-02 ENCOUNTER — Encounter: Payer: Self-pay | Admitting: Infectious Disease

## 2019-02-02 DIAGNOSIS — A310 Pulmonary mycobacterial infection: Secondary | ICD-10-CM

## 2019-02-02 NOTE — Progress Notes (Signed)
Patient did niot pick up the phone after I called her 4 times between 145 and [phone]  She needs to schedule and we do have some treatment options to consider now we have resistance data back on her M avium infection

## 2019-02-05 ENCOUNTER — Telehealth: Payer: Self-pay

## 2019-02-05 NOTE — Telephone Encounter (Signed)
Jenn from [facility] called in asking if Birdie Riddle would give the ok for a hospice referral to see pt and if she would be the attending physician. Please call Jenn back at [phone] to give the ok and it is ok to LM if no answer.

## 2019-02-05 NOTE — Telephone Encounter (Signed)
Called and gave the verbal ok.

## 2019-02-05 NOTE — Telephone Encounter (Signed)
I am able to do a Hospice referral for Mia Hernandez but I am not able to write a letter regarding competency.  Competency is a legal matter, not a medical one.  Also, I have never met the son.  I would need to hear from the patient her thoughts on this matter before any letters or decisions could be made.  Also, Hospice has social workers that can assist in this matter

## 2019-02-05 NOTE — Telephone Encounter (Signed)
Patient's son, Warden Fillers [phone], called in stating that he is trying to become POA for his parents. Needs a letter, including office letterhead, that Mrs. Mia Hernandez is competent to make this decision per PCP. Also is requesting that PCP send in referral for hospice assessment. Informed Brien that I would route a message to Dr. Birdie Riddle. Please advise

## 2019-02-06 ENCOUNTER — Telehealth: Payer: Self-pay | Admitting: *Deleted

## 2019-02-06 DIAGNOSIS — J479 Bronchiectasis, uncomplicated: Secondary | ICD-10-CM | POA: Diagnosis not present

## 2019-02-06 DIAGNOSIS — R630 Anorexia: Secondary | ICD-10-CM | POA: Diagnosis not present

## 2019-02-06 DIAGNOSIS — Z681 Body mass index (BMI) 19 or less, adult: Secondary | ICD-10-CM | POA: Diagnosis not present

## 2019-02-06 DIAGNOSIS — I679 Cerebrovascular disease, unspecified: Secondary | ICD-10-CM | POA: Diagnosis not present

## 2019-02-06 DIAGNOSIS — Z8619 Personal history of other infectious and parasitic diseases: Secondary | ICD-10-CM | POA: Diagnosis not present

## 2019-02-06 DIAGNOSIS — F0281 Dementia in other diseases classified elsewhere with behavioral disturbance: Secondary | ICD-10-CM | POA: Diagnosis not present

## 2019-02-06 DIAGNOSIS — Z902 Acquired absence of lung [part of]: Secondary | ICD-10-CM | POA: Diagnosis not present

## 2019-02-06 DIAGNOSIS — I10 Essential (primary) hypertension: Secondary | ICD-10-CM | POA: Diagnosis not present

## 2019-02-06 DIAGNOSIS — F329 Major depressive disorder, single episode, unspecified: Secondary | ICD-10-CM | POA: Diagnosis not present

## 2019-02-06 DIAGNOSIS — H469 Unspecified optic neuritis: Secondary | ICD-10-CM | POA: Diagnosis not present

## 2019-02-06 DIAGNOSIS — E870 Hyperosmolality and hypernatremia: Secondary | ICD-10-CM | POA: Diagnosis not present

## 2019-02-06 DIAGNOSIS — G309 Alzheimer's disease, unspecified: Secondary | ICD-10-CM | POA: Diagnosis not present

## 2019-02-06 DIAGNOSIS — J90 Pleural effusion, not elsewhere classified: Secondary | ICD-10-CM | POA: Diagnosis not present

## 2019-02-06 DIAGNOSIS — K589 Irritable bowel syndrome without diarrhea: Secondary | ICD-10-CM | POA: Diagnosis not present

## 2019-02-06 DIAGNOSIS — M810 Age-related osteoporosis without current pathological fracture: Secondary | ICD-10-CM | POA: Diagnosis not present

## 2019-02-06 DIAGNOSIS — J309 Allergic rhinitis, unspecified: Secondary | ICD-10-CM | POA: Diagnosis not present

## 2019-02-06 NOTE — Telephone Encounter (Signed)
Adam called again, would like to add standing order for insomnia as well. Temazepam 15 mg at bedtime, repeat x 1

## 2019-02-06 NOTE — Telephone Encounter (Signed)
I believe Hospice has standing orders for this.

## 2019-02-06 NOTE — Telephone Encounter (Signed)
Called and advised Mia Hernandez. Chart updated to reflect the changes. He was calling them into the pharmacy.

## 2019-02-06 NOTE — Telephone Encounter (Deleted)
They cannot do a 7.5 on the Temazepam as it is a capsule (15 mg)  And the 7.5mg  is not covered by insurance. Would we OK ativan for insomnia instead?

## 2019-02-06 NOTE — Telephone Encounter (Signed)
Called and spoke with Quita Skye at hospice. He advised that the standing orders are for Lorazepam 0.5mg  PO q 4 hrs. Or they have standing orders for agitation for Haloperidol 5mg  PO sublingual q4hrs.

## 2019-02-06 NOTE — Telephone Encounter (Signed)
Nurse Adam [phone] form Hospice requesting  Rx Anxiety   Pt C/C Agitation, anxiety and not sleeping for the past week   Pt Pharmacy: [facility] [id number]

## 2019-02-06 NOTE — Telephone Encounter (Signed)
Please advise 

## 2019-02-06 NOTE — Telephone Encounter (Signed)
She cannot take both Temazepam and Lorazepam at the same time  [facility] for Temazepam 7.5mg  QHS prn sleep.  Can take Lorazepam 0.5mg  po Q4 prn but not in the evening approaching bed (Temazepam would be the appropriate choice at that time)

## 2019-02-07 DIAGNOSIS — G309 Alzheimer's disease, unspecified: Secondary | ICD-10-CM | POA: Diagnosis not present

## 2019-02-07 DIAGNOSIS — F0281 Dementia in other diseases classified elsewhere with behavioral disturbance: Secondary | ICD-10-CM | POA: Diagnosis not present

## 2019-02-07 DIAGNOSIS — Z8619 Personal history of other infectious and parasitic diseases: Secondary | ICD-10-CM | POA: Diagnosis not present

## 2019-02-07 DIAGNOSIS — Z902 Acquired absence of lung [part of]: Secondary | ICD-10-CM | POA: Diagnosis not present

## 2019-02-07 DIAGNOSIS — R630 Anorexia: Secondary | ICD-10-CM | POA: Diagnosis not present

## 2019-02-07 DIAGNOSIS — J479 Bronchiectasis, uncomplicated: Secondary | ICD-10-CM | POA: Diagnosis not present

## 2019-02-09 ENCOUNTER — Ambulatory Visit: Payer: Medicare Other | Admitting: Endocrinology

## 2019-02-11 ENCOUNTER — Telehealth: Payer: Self-pay | Admitting: Family Medicine

## 2019-02-11 NOTE — Telephone Encounter (Signed)
I have placed [facility]  Orders in the bin upfront with a charge sheet.

## 2019-02-11 NOTE — Telephone Encounter (Signed)
Given to PCP for completion

## 2019-02-12 NOTE — Telephone Encounter (Signed)
FYI

## 2019-02-12 NOTE — Telephone Encounter (Signed)
Picked up from the back, faxed and sent to scan.  °

## 2019-02-12 NOTE — Telephone Encounter (Signed)
Form completed and placed in basket  

## 2019-03-09 DEATH — deceased

## 2019-03-17 ENCOUNTER — Ambulatory Visit: Payer: Medicare Other | Admitting: Internal Medicine

## 2019-04-14 ENCOUNTER — Ambulatory Visit: Payer: Medicare Other | Admitting: Family Medicine

## 2019-05-13 NOTE — Telephone Encounter (Signed)
Noted. Will be on the look out for it.

## 2019-05-13 NOTE — Telephone Encounter (Signed)
Mia Hernandez with [facility] is missing a signed supplemental order for 02/07/19.  Will fax order over today for signature.  Can fax back to [phone].  If questions you can call [phone].
